# Patient Record
Sex: Male | Born: 1955 | Race: White | Hispanic: No | Marital: Married | Smoking: Current some day smoker
Health system: Southern US, Community
[De-identification: ages and names within clinical notes are randomized; demographics above are authoritative.]

## PROBLEM LIST (undated history)

## (undated) DIAGNOSIS — K2270 Barrett's esophagus without dysplasia: Secondary | ICD-10-CM

## (undated) DIAGNOSIS — N5089 Other specified disorders of the male genital organs: Secondary | ICD-10-CM

## (undated) DIAGNOSIS — C801 Malignant (primary) neoplasm, unspecified: Secondary | ICD-10-CM

## (undated) DIAGNOSIS — T7840XA Allergy, unspecified, initial encounter: Secondary | ICD-10-CM

## (undated) DIAGNOSIS — K5792 Diverticulitis of intestine, part unspecified, without perforation or abscess without bleeding: Secondary | ICD-10-CM

## (undated) DIAGNOSIS — Z85828 Personal history of other malignant neoplasm of skin: Secondary | ICD-10-CM

## (undated) DIAGNOSIS — Z87442 Personal history of urinary calculi: Secondary | ICD-10-CM

## (undated) DIAGNOSIS — T4145XA Adverse effect of unspecified anesthetic, initial encounter: Secondary | ICD-10-CM

## (undated) DIAGNOSIS — G4733 Obstructive sleep apnea (adult) (pediatric): Secondary | ICD-10-CM

## (undated) DIAGNOSIS — C6290 Malignant neoplasm of unspecified testis, unspecified whether descended or undescended: Secondary | ICD-10-CM

## (undated) DIAGNOSIS — J302 Other seasonal allergic rhinitis: Secondary | ICD-10-CM

## (undated) DIAGNOSIS — N529 Male erectile dysfunction, unspecified: Secondary | ICD-10-CM

## (undated) DIAGNOSIS — E7800 Pure hypercholesterolemia, unspecified: Secondary | ICD-10-CM

## (undated) DIAGNOSIS — I351 Nonrheumatic aortic (valve) insufficiency: Secondary | ICD-10-CM

## (undated) DIAGNOSIS — D649 Anemia, unspecified: Secondary | ICD-10-CM

## (undated) DIAGNOSIS — I38 Endocarditis, valve unspecified: Secondary | ICD-10-CM

## (undated) DIAGNOSIS — Z8679 Personal history of other diseases of the circulatory system: Secondary | ICD-10-CM

## (undated) DIAGNOSIS — R918 Other nonspecific abnormal finding of lung field: Secondary | ICD-10-CM

## (undated) DIAGNOSIS — I10 Essential (primary) hypertension: Secondary | ICD-10-CM

## (undated) DIAGNOSIS — E785 Hyperlipidemia, unspecified: Secondary | ICD-10-CM

## (undated) DIAGNOSIS — Z860100 Personal history of colon polyps, unspecified: Secondary | ICD-10-CM

## (undated) DIAGNOSIS — Z9889 Other specified postprocedural states: Secondary | ICD-10-CM

## (undated) DIAGNOSIS — T8859XA Other complications of anesthesia, initial encounter: Secondary | ICD-10-CM

## (undated) HISTORY — DX: Personal history of colon polyps, unspecified: Z86.0100

## (undated) HISTORY — DX: Personal history of colonic polyps: Z86.010

## (undated) HISTORY — PX: HERNIA REPAIR: SHX51

## (undated) HISTORY — PX: EXTRACORPOREAL SHOCK WAVE LITHOTRIPSY: SHX1557

## (undated) HISTORY — DX: Diverticulitis of intestine, part unspecified, without perforation or abscess without bleeding: K57.92

## (undated) HISTORY — PX: COLONOSCOPY WITH ESOPHAGOGASTRODUODENOSCOPY (EGD): SHX5779

## (undated) HISTORY — DX: Anemia, unspecified: D64.9

## (undated) HISTORY — DX: Pure hypercholesterolemia, unspecified: E78.00

## (undated) HISTORY — DX: Hyperlipidemia, unspecified: E78.5

## (undated) HISTORY — PX: MOHS SURGERY: SHX181

## (undated) HISTORY — PX: COLONOSCOPY: SHX174

## (undated) HISTORY — DX: Allergy, unspecified, initial encounter: T78.40XA

## (undated) HISTORY — DX: Other seasonal allergic rhinitis: J30.2

## (undated) HISTORY — PX: TRANSTHORACIC ECHOCARDIOGRAM: SHX275

## (undated) HISTORY — DX: Personal history of other diseases of the circulatory system: Z86.79

## (undated) HISTORY — DX: Barrett's esophagus without dysplasia: K22.70

---

## 1998-02-17 DIAGNOSIS — C4491 Basal cell carcinoma of skin, unspecified: Secondary | ICD-10-CM

## 1998-02-17 HISTORY — DX: Basal cell carcinoma of skin, unspecified: C44.91

## 1998-10-06 ENCOUNTER — Encounter: Payer: Self-pay | Admitting: Emergency Medicine

## 1998-10-06 ENCOUNTER — Observation Stay (HOSPITAL_COMMUNITY): Admission: EM | Admit: 1998-10-06 | Discharge: 1998-10-06 | Payer: Self-pay | Admitting: Emergency Medicine

## 1998-10-13 ENCOUNTER — Encounter: Payer: Self-pay | Admitting: Urology

## 1998-10-13 ENCOUNTER — Ambulatory Visit (HOSPITAL_COMMUNITY): Admission: RE | Admit: 1998-10-13 | Discharge: 1998-10-13 | Payer: Self-pay | Admitting: Urology

## 1998-12-21 ENCOUNTER — Encounter: Payer: Self-pay | Admitting: Emergency Medicine

## 1998-12-21 ENCOUNTER — Emergency Department (HOSPITAL_COMMUNITY): Admission: EM | Admit: 1998-12-21 | Discharge: 1998-12-21 | Payer: Self-pay | Admitting: Emergency Medicine

## 2002-01-13 DIAGNOSIS — C4491 Basal cell carcinoma of skin, unspecified: Secondary | ICD-10-CM

## 2002-01-13 HISTORY — DX: Basal cell carcinoma of skin, unspecified: C44.91

## 2002-12-24 ENCOUNTER — Encounter: Payer: Self-pay | Admitting: Pulmonary Disease

## 2002-12-24 ENCOUNTER — Ambulatory Visit (HOSPITAL_COMMUNITY): Admission: RE | Admit: 2002-12-24 | Discharge: 2002-12-24 | Payer: Self-pay | Admitting: Pulmonary Disease

## 2004-12-12 ENCOUNTER — Ambulatory Visit: Payer: Self-pay | Admitting: Pulmonary Disease

## 2005-01-08 ENCOUNTER — Ambulatory Visit (HOSPITAL_BASED_OUTPATIENT_CLINIC_OR_DEPARTMENT_OTHER): Admission: RE | Admit: 2005-01-08 | Discharge: 2005-01-08 | Payer: Self-pay | Admitting: Pulmonary Disease

## 2005-01-18 ENCOUNTER — Ambulatory Visit: Payer: Self-pay | Admitting: Pulmonary Disease

## 2005-04-30 HISTORY — PX: OTHER SURGICAL HISTORY: SHX169

## 2005-11-02 ENCOUNTER — Ambulatory Visit: Payer: Self-pay | Admitting: Pulmonary Disease

## 2006-01-15 ENCOUNTER — Ambulatory Visit: Payer: Self-pay | Admitting: Internal Medicine

## 2006-01-24 ENCOUNTER — Ambulatory Visit: Payer: Self-pay | Admitting: Internal Medicine

## 2006-01-24 ENCOUNTER — Encounter (INDEPENDENT_AMBULATORY_CARE_PROVIDER_SITE_OTHER): Payer: Self-pay | Admitting: Specialist

## 2007-10-30 ENCOUNTER — Encounter: Payer: Self-pay | Admitting: Pulmonary Disease

## 2007-11-06 ENCOUNTER — Ambulatory Visit (HOSPITAL_COMMUNITY): Admission: RE | Admit: 2007-11-06 | Discharge: 2007-11-06 | Payer: Self-pay | Admitting: Urology

## 2007-12-31 ENCOUNTER — Encounter: Payer: Self-pay | Admitting: Pulmonary Disease

## 2008-04-27 ENCOUNTER — Encounter: Payer: Self-pay | Admitting: Pulmonary Disease

## 2008-12-23 ENCOUNTER — Encounter: Payer: Self-pay | Admitting: Pulmonary Disease

## 2009-02-11 ENCOUNTER — Telehealth (INDEPENDENT_AMBULATORY_CARE_PROVIDER_SITE_OTHER): Payer: Self-pay | Admitting: *Deleted

## 2009-02-17 ENCOUNTER — Ambulatory Visit: Payer: Self-pay | Admitting: Pulmonary Disease

## 2009-02-17 DIAGNOSIS — I359 Nonrheumatic aortic valve disorder, unspecified: Secondary | ICD-10-CM | POA: Insufficient documentation

## 2009-02-17 DIAGNOSIS — R0989 Other specified symptoms and signs involving the circulatory and respiratory systems: Secondary | ICD-10-CM | POA: Insufficient documentation

## 2009-02-17 DIAGNOSIS — N200 Calculus of kidney: Secondary | ICD-10-CM | POA: Insufficient documentation

## 2009-02-17 DIAGNOSIS — R0609 Other forms of dyspnea: Secondary | ICD-10-CM | POA: Insufficient documentation

## 2009-02-17 DIAGNOSIS — K429 Umbilical hernia without obstruction or gangrene: Secondary | ICD-10-CM | POA: Insufficient documentation

## 2009-02-17 DIAGNOSIS — M545 Low back pain, unspecified: Secondary | ICD-10-CM | POA: Insufficient documentation

## 2009-02-17 DIAGNOSIS — E7800 Pure hypercholesterolemia, unspecified: Secondary | ICD-10-CM | POA: Insufficient documentation

## 2009-03-01 ENCOUNTER — Encounter: Payer: Self-pay | Admitting: Pulmonary Disease

## 2009-03-01 ENCOUNTER — Ambulatory Visit: Payer: Self-pay | Admitting: Cardiology

## 2009-03-01 ENCOUNTER — Ambulatory Visit (HOSPITAL_COMMUNITY): Admission: RE | Admit: 2009-03-01 | Discharge: 2009-03-01 | Payer: Self-pay | Admitting: Pulmonary Disease

## 2009-03-01 ENCOUNTER — Ambulatory Visit: Payer: Self-pay

## 2009-03-10 ENCOUNTER — Encounter: Payer: Self-pay | Admitting: Pulmonary Disease

## 2009-03-18 ENCOUNTER — Telehealth: Payer: Self-pay | Admitting: Pulmonary Disease

## 2009-06-08 ENCOUNTER — Encounter: Payer: Self-pay | Admitting: Pulmonary Disease

## 2009-06-08 HISTORY — PX: UMBILICAL HERNIA REPAIR: SHX196

## 2009-06-28 ENCOUNTER — Encounter: Payer: Self-pay | Admitting: Pulmonary Disease

## 2009-08-12 ENCOUNTER — Ambulatory Visit: Payer: Self-pay | Admitting: Pulmonary Disease

## 2009-12-08 ENCOUNTER — Encounter: Payer: Self-pay | Admitting: Pulmonary Disease

## 2009-12-23 ENCOUNTER — Encounter: Payer: Self-pay | Admitting: Pulmonary Disease

## 2010-03-13 ENCOUNTER — Ambulatory Visit: Payer: Self-pay | Admitting: Pulmonary Disease

## 2010-03-13 ENCOUNTER — Encounter: Payer: Self-pay | Admitting: Pulmonary Disease

## 2010-05-30 NOTE — Progress Notes (Signed)
Summary: FORMER PT  Phone Note Call from Patient Call back at [phone]   Caller: Patient Call For: [name] Summary of Call: PT LAST SEEN IN 2006 WOULD LIKE TO BE RE-ESTABLISH WITH DR [name] Initial call taken by: Rickard Patience,  February 11, 2009 9:07 AM  Follow-up for Phone Call        Please advise if this is okay thanks! [name] Anderson  February 11, 2009 12:13 PM per sn ok to schedule pt for cpx Follow-up by: Philipp Deputy CMA,  February 15, 2009 4:33 PM

## 2010-05-30 NOTE — Consult Note (Signed)
Summary: Symptomatic Umbilical Hernia/[facility]  Symptomatic Umbilical Hernia/[facility]   Imported By: Sherian Rein 03/28/2009 08:53:08  _____________________________________________________________________  External Attachment:    Type:   Image     Comment:   External Document

## 2010-05-30 NOTE — Letter (Signed)
Summary: [facility]   Imported By: Lester Dunkirk 07/11/2009 09:17:01  _____________________________________________________________________  External Attachment:    Type:   Image     Comment:   External Document

## 2010-05-30 NOTE — Consult Note (Signed)
Summary: renal stones/[facility]  renal stones/[facility]   Imported By: Lester Coward 01/12/2008 09:33:57  _____________________________________________________________________  External Attachment:    Type:   Image     Comment:   External Document

## 2010-05-30 NOTE — Assessment & Plan Note (Signed)
Summary: 6 months/apc   CC:  7 month ROV & review of mult medical problems....  History of Present Illness: 55 y/o WM here for a follow up visit...   ~  February 17, 2009:  [name] Anderson was last seen 8/06... on no regular meds... he works for the [employer] and gets annual eval w/ labs there & he will have these labs forwarded to me for review... his CC is mid- abd discomfort/ pain x 3-4 months> periumbilical, intermittent, getting worse- rated 5-6/10, occuring 2-3 d per week, precipitated by lifting, & hasn't tried anything for relief- but notes pain decr if supine... assoc w/ "knot" around the belly button that is tender... denies n/v, denies change in BM/d/c/blood... exam shows a tender umbilical hernia & we discussed refer to [facility] for Rx.   ~  August 12, 2009:  he had umbil hernia repair 2/11 by DrIngram> he reports doing well & has recovered nicely & released by the surgeon... otherw feeling well & back to baseline> we reviewed his prev data & encouraged weight reduction, diet, exercise.   ~  March 13, 2010:  his only meds= ASA & Fish Oil but his Chol remains signif elev & he needs Statin Rx but declines to start this needed medication "I'll incr Fish Oil" & I explained that this won't get him to goal- offered second opinion w/ Cards vs Endocrine or Lipid Clinic but he declines my offers & prefers diet Rx alone + his Fish POil supplements... he also has signif snoring problem w/ prev ENT surg in past but he continues to snore & pt or wife freq sleep in another room because of the noise- he denies daytime hypersomnolence, sleep pressure, etc... I reviewed prev sleep study & recs from DrClance but he declines sleep med f/u, dental appliance, or CPAP trial...  hx Ao valve dis w/ mod AI & he knows to use Amox prior to dental work> denies CP, palpit, SOB, edema, etc...   Current Problems:   SNORING (ICD-786.09) - long hx of snoring problems> hx some mild daytime symptoms...  ~  he had a nasal septal  repair & uvulectomy yrs ago by [employer] [facility]...  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  ~  eval 2007 by DrClance & offer oral appliance vs CPAP trial but pt couldn't make up his mind...  ~  11/11: he notes that he or wife freq have to sleep in another room due to his snoring>  offered sleep f/u w/ DrClance, consideration of oral appliance, vs CPAP trial- but he declines all interventions...  AORTIC VALVE DISORDERS (ICD-424.1) - no prev known hx of valvular heart disease... he denies CP, palpit, SOB/ DOE, edema, etc... 2DEcho 11/10 w/ mild-mod AI & rec for SBE prophylaxis w/ AMOX.  ~  CXR 10/10 showed normal- norm hrt size, & Ao, clear lungs...  ~  EKG 1/10 shows NSR, WNL.Marland Kitchen.  ~  exam 10/10 showed gr 2/6 AI murmur & 2DEcho showed norm LV, mild DD, mild-mod AI & mild MR...  ~  exam 11/11 w/ similar murmur & no change (he is asymptomatic)...  HYPERCHOLESTEROLEMIA (ICD-272.0) - he remains on diet + exercise therapy+ FISH OIL... his weight has been stable in the 205-210# range and can't seem to lose weight... 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  ~  last FLP here 2002 showed TChol 233, TG 120, HDL 38, LDL 171... offered meds, prefers diet.  ~  labs 8/10 at [facility] showed TChol 219, TG 89, HDL 38, LDL 163... he does not want med Rx.  ~  labs 8/11 from [facility] showed TChol 254, TG 120, HDL 44, LDL 186... he declined all offers for med rx or Lipid Clinic review etc.  COLONIC POLYPS (ICD-211.3)  ~  last colonoscopy 9/07 by Rodena Medin showed 2 sm polyps, largest 5mm, bx= tubular adenoma- f/u planned 46yrs.  UMBILICAL HERNIA (ICD-553.1) - s/p umbil hernia repair 2/11 by DrHIngram...  NEPHROLITHIASIS (ICD-592.0) - followed by DrDahlstadt & last seen earlier in 2010- passed stone on his own w/ Flomax help...   ~  11/11: he states that he passed another stone on his own about 3 months ago...  BACK PAIN, LUMBAR (ICD-724.2) - referred to [facility] 2004 for eval LBP... Rx w/ Celebrex, Robaxin, & PT w/ improvement in his discomfort...  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   Preventive Screening-Counseling & Management  Alcohol-Tobacco     Smoking Status: never  Allergies (verified): No Known Drug Allergies  Comments:  Nurse/Medical Assistant: The patient's medications and allergies were reviewed with the patient and were updated in the Medication and Allergy Lists.  Past History:  Past Medical History: SNORING (ICD-786.09) AORTIC VALVE DISORDERS (ICD-424.1) HYPERCHOLESTEROLEMIA (ICD-272.0) COLONIC POLYPS (ICD-211.3) UMBILICAL HERNIA (ICD-553.1) NEPHROLITHIASIS (ICD-592.0) BACK PAIN, LUMBAR (ICD-724.2) SKIN CANCER, HX OF (ICD-V10.83)  Past Surgical History: S/P umbilical hernia repair 2/11 by DrHIngram  Family History: Reviewed history from 02/17/2009 and no changes required. mother alive age 46 father deceased age 65 from colon cancer 1 sibling alive age 33   1 sibling alive age 102  Social History: Reviewed history from 02/17/2009 and no changes required. never smoked exposed to second hand smoke exercises sometimes 2 x per week caffeine use  2-3 cups per day uses smokeless tobacco---dip no alcohol use married works for the [employer] 2 children Smoking Status:  never  Review of Systems      See HPI  The patient denies anorexia, fever, weight loss, weight gain, vision loss, decreased hearing, hoarseness, chest pain, syncope, dyspnea on exertion, peripheral edema, prolonged cough, headaches, hemoptysis, abdominal pain, melena, hematochezia, severe indigestion/heartburn, hematuria, incontinence, muscle weakness, suspicious skin lesions, transient blindness, difficulty  walking, depression, unusual weight change, abnormal bleeding, enlarged lymph nodes, and angioedema.    Vital Signs:  Patient profile:   55 year old male Height:      71 inches Weight:      209 pounds BMI:     29.25 O2 Sat:      99 % on Room air Temp:     97.9 degrees F oral Pulse rate:   76 / minute BP sitting:   120 / 78  (left arm) Cuff size:   regular  Vitals Entered By: Randell Loop CMA (March 13, 2010 10:00 AM)  O2 Sat at Rest %:  99 O2 Flow:  Room air CC: 7 month ROV & review of mult medical problems... Is Patient Diabetic? No Pain Assessment Patient in pain? no      Comments MEDS UPDATED TODAY WITH PT   Physical Exam  Additional Exam:  WD, WN, 55 y/o WM in NAD... GENERAL:  Alert & oriented; pleasant & cooperative. HEENT:  [place]/AT, EOM-wnl, PERRLA, EACs-clear, TMs-wnl, NOSE-clear, THROAT-clear & wnl. NECK:  Supple w/ full ROM; no JVD; normal carotid impulses w/o bruits; no thyromegaly or nodules palpated; no lymphadenopathy. CHEST:  Clear to P & A; without wheezes/ rales/ or rhonchi.... HEART:  Regular Rhythm;  gr 2/6 AI murmur, w/o rubs or gallops detected... ABDOMEN:  s/p umbil hernia repair- well healed scar; normal bowel sounds; no organomegaly or masses palpated... RECTAL:  prostate 2+ normal, stool heme neg... EXT: without deformities or arthritic changes; no varicose veins/ venous insuffic/ or edema. NEURO:  CN's intact; motor testing normal; sensory testing normal; gait normal & balance OK. DERM:  No lesions noted; no rash etc...    CXR  Procedure date:  03/13/2010  Findings:      CHEST - 2 VIEW Comparison: Chest radiograph 02/18/1999   Findings: Normal mediastinum and heart silhouette.  Costophrenic angles are clear.  No evidence effusion, infiltrate, or pneumothorax   IMPRESSION: No acute cardiopulmonary process.   Read By:  Genevive Bi,  M.D.   EKG  Procedure date:  03/13/2010  Findings:      Normal sinus rhythm with rate of:   64/ min... Tracing is WNL, NAD...  SN   MISC. Report  Procedure date:  12/08/2009  Findings:      LABS FROM [facility] REVIEWED W/ Pt & will be scanned into the record...   SN   Impression & Recommendations:  Problem # 1:  SNORING (ICD-786.09) I have offered repeat sleep eval, consideration of oral appliance, & CPAP trial>  he continues to decline intervention to help his snoring...  Orders: T-2 View CXR (71020TC)  Problem # 2:  AORTIC VALVE DISORDERS (ICD-424.1) Stable mild to mod AI on exam... he is asymptomatic & knows to use SBE prophy w/ dental work... His updated medication list for this problem includes:    Aspir-low 81 Mg Tbec (Aspirin) .Marland Kitchen... Take 1 tablet by mouth once a day  Orders: 12 Lead EKG (12 Lead EKG) T-2 View CXR (71020TC)  Problem # 3:  HYPERCHOLESTEROLEMIA (ICD-272.0) Signif hyperchol> bur he declines Statin meds or referral for second opinion... he understands how plaque build up in vessels & leads to blockages that could cause MI or stroke (he still declines med rx)...  Problem # 4:  COLONIC POLYPS (ICD-211.3) Up to date on colon screening w/ f/u due 9/12...  Problem # 5:  NEPHROLITHIASIS (ICD-592.0) He says that he passed a sm stone on his own in Aug11...  Problem # 6:  OTHER MEDICAL ISSUES AS NOTED>>>  Complete Medication List: 1)  Aspir-low 81 Mg Tbec (Aspirin) .... Take 1 tablet by mouth once a day 2)  Fish Oil 1000 Mg Caps (Omega-3 fatty acids) .... Take 1 tablet by mouth once a day  Patient Instructions: 1)  Today we reviewed your fasting blood work done at [facility] 8/11> your Lipid panel remains abnormal &  your LDL is worse (now at 189)... I recommend that you start on a statin med for the hypercholesterolemia... the best statins for the purpose are Crestor, Lipitor, Simvistatin & Pravastatin> let me know if you want to consider med Rx for this problem... 2)  In the meanwhile you need a vigorous low chol, low fat, now carb diet to get your  weight down... 3)  Today we did your follow up CXR & EKG... please call the "phone tree" in a few days for your results.Marland KitchenMarland Kitchen 4)  Call for any questions...   Immunization History:  Influenza Immunization History:  Influenza:  historical (02/13/2010)

## 2010-05-30 NOTE — Assessment & Plan Note (Signed)
Summary: stomach pain//td   CC:  4 year ROV & add-on for abd pain....  History of Present Illness: 55 y/o WM here for a follow up visit...   ~  February 17, 2009:  MrSmith was last seen 8/06... on no regular meds... he works for the [employer] and gets annual eval w/ labs there & he will have these labs forwarded to me for review... his CC is mid- abd discomfort/ pain x 3-4 months> periumbilical, intermittent, getting worse- rated 5-6/10, occuring 2-3 d per week, precipitated by lifting, & hasn't tried anything for relief- but notes pain decr if supine... assoc w/ "knot" around the belly button that is tender... denies n/v, denies change in BM/d/c/blood... exam shows a tender umbilical hernia & we discussed refer to [facility] for Rx.   Current Problems:   SNORING (ICD-786.09) - long hx of snoring problems> hx ome mild daytime symptoms...  ~  he had a nasal septal repair & uvulectomy yrs ago by [employer] [facility]...  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  ~  eval 2007 by DrClance & offer oral appliance vs CPAP trial but pt couldn't make up his mind...  AORTIC VALVE DISORDERS (ICD-424.1) - no prev known hx of valvular heart disease... he denies CP, palpit, SOB/ DOE, edema, etc...  ~  CXR 10/10 showed normal- norm hrt size, & Ao, clear lungs...  ~  EKG 1/10 shows NSR, WNL.Marland Kitchen.  ~  exam 10/10 showed gr 2/6 AI murmur & referred for 2DEcho...  HYPERCHOLESTEROLEMIA (ICD-272.0) - he remains on diet + exercise therapy... his weight has been stable in the 205-210# range and can't seem to lose weight... he gets labs at fire dept...  ~  last FLP here 2002 showed TChol 233, TG 120, HDL 38, LDL 171... offered meds, prefers diet.  COLONIC POLYPS (ICD-211.3)  ~  last colonoscopy 9/07 by Rodena Medin showed 2 sm polyps,  largest 5mm, bx= tubular adenoma- f/u planned 60yrs.  UMBILICAL HERNIA (ICD-553.1) ** SEE ABOVE ** refer to [facility]...  NEPHROLITHIASIS (ICD-592.0) - followed by DrDahlstadt & last seen earlier in 2010- passed stone on his own w/ Flomax help...   BACK PAIN, LUMBAR (ICD-724.2) - referred to [facility] for eval LBP... Rx w/ Celebrex, Robaxin, & PT w/ improvement in his discomfort...  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    Allergies (verified): No Known Drug Allergies  Past History:  Past Medical History:  SNORING (ICD-786.09) AORTIC VALVE DISORDERS (ICD-424.1) HYPERCHOLESTEROLEMIA (ICD-272.0) COLONIC POLYPS (ICD-211.3) UMBILICAL HERNIA (ICD-553.1) NEPHROLITHIASIS (ICD-592.0) BACK PAIN, LUMBAR (ICD-724.2) SKIN CANCER, HX OF (ICD-V10.83)  Family History: Reviewed history and no changes required. mother alive age 35 father deceased age 63 from colon cancer 1 sibling alive age 4   1 sibling alive age 37  Social History: Reviewed history and no changes required. never smoked exposed to second hand smoke exercises sometimes 2 x per week caffeine use  2-3 cups per day uses smokeless tobacco---dip no alcohol use married works for the [employer] 2 children  Review of Systems       The patient complains of abdominal pain.  The patient denies fever, chills, sweats, anorexia, fatigue, weakness, malaise, weight  loss, sleep disorder, blurring, diplopia, eye irritation, eye discharge, vision loss, eye pain, photophobia, earache, ear discharge, tinnitus, decreased hearing, nasal congestion, nosebleeds, sore throat, hoarseness, chest pain, palpitations, syncope, dyspnea on exertion, orthopnea, PND, peripheral edema, cough, dyspnea at rest, excessive sputum, hemoptysis, wheezing, pleurisy, nausea, vomiting, diarrhea, constipation, change in bowel habits, melena, hematochezia, jaundice,  gas/bloating, indigestion/heartburn, dysphagia, odynophagia, dysuria, hematuria, urinary frequency, urinary hesitancy, nocturia, incontinence, back pain, joint pain, joint swelling, muscle cramps, muscle weakness, stiffness, arthritis, sciatica, restless legs, leg pain at night, leg pain with exertion, rash, itching, dryness, suspicious lesions, paralysis, paresthesias, seizures, tremors, vertigo, transient blindness, frequent falls, frequent headaches, difficulty walking, depression, anxiety, memory loss, confusion, cold intolerance, heat intolerance, polydipsia, polyphagia, polyuria, unusual weight change, abnormal bruising, bleeding, enlarged lymph nodes, urticaria, allergic rash, hay fever, and recurrent infections.    Vital Signs:  Patient profile:   55 year old male Height:      71 inches Weight:      208.50 pounds BMI:     29.18 O2 Sat:      95 % on Room air Temp:     97.8 degrees F oral Pulse rate:   82 / minute BP sitting:   126 / 70  (left arm) Cuff size:   regular  Vitals Entered By: Marijo File CMA (February 17, 2009 2:32 PM)  O2 Sat at Rest %:  95 O2 Flow:  Room air CC: 4 year ROV & add-on for abd pain... Is Patient Diabetic? No Pain Assessment Patient in pain? no      Comments meds updated today   Physical Exam  Additional Exam:  WD, WN, 55 y/o WM in NAD... GENERAL:  Alert & oriented; pleasant & cooperative. HEENT:  [place]/AT, EOM-wnl, PERRLA, EACs-clear, TMs-wnl, NOSE-clear, THROAT-clear & wnl. NECK:  Supple w/ full ROM; no JVD; normal carotid impulses w/o bruits; no thyromegaly or nodules palpated; no lymphadenopathy. CHEST:  Clear to P & A; without wheezes/ rales/ or rhonchi.... HEART:  Regular Rhythm;  gr 2/6 AI murmur, w/o rubs or gallops detected... ABDOMEN:  Small umbilical hernia, tender on palp, normal bowel sounds; no organomegaly or masses palpated... RECTAL:  Neg - prostate 2+ & nontender w/o nodules; stool hematest neg... no inguinal hernia noted. EXT:  without deformities or arthritic changes; no varicose veins/ venous insuffic/ or edema. NEURO:  CN's intact; motor testing normal; sensory testing normal; gait normal & balance OK. DERM:  No lesions noted; no rash etc...     EKG  Procedure date:  02/17/2009  Findings:      Normal sinus rhythm with rate of:  70/min... EKG is WNL.Marland KitchenMarland Kitchen    CXR  Procedure date:  02/17/2009  Findings:      CHEST - 2 VIEW   Comparison: None.   Findings: Heart size is normal and the aorta is normal in size and contour.  The lungs are clear without infiltrate or mass.   IMPRESSION: No active cardiopulmonary disease.   Read By:  Camelia Phenes,  M.D.    Impression & Recommendations:  Problem # 1:  UMBILICAL HERNIA (ICD-553.1) His abd pain appears to be related to an umbil hernia and will need repair>>> refer to [facility]. Orders: Surgical Referral (Surgery)  Problem # 2:  SNORING (ICD-786.09) Stable-  he states NO daytime symptoms now & he's not interested in pursuung this further...  Problem # 3:  AORTIC VALVE DISORDERS (ICD-424.1) New finding on todays exam... check of prev evals from 2006 & before was neg- no  AI noted then... He denies CP, palpit, SOB, edema, etc... we will check 2DEcho & has cards eval after that... His updated medication list for this problem includes:    Aspir-low 81 Mg Tbec (Aspirin) .Marland Kitchen... Take 1 tablet by mouth once a day  Orders: 12 Lead EKG (12 Lead EKG) T-2 View CXR (71020TC) 2 D Echo (2 D Echo)  Problem # 4:  HYPERCHOLESTEROLEMIA (ICD-272.0) He will get the labs from the last few yrs referred to me to review... discussed diet + exercise therapy in the interim...  Problem # 5:  COLONIC POLYPS (ICD-211.3) Stable & up to date on his colonoscopies...  Problem # 6:  NEPHROLITHIASIS (ICD-592.0) Stable & followed by Urology...  Problem # 7:  OTHER MEDICAL PROBLEMS AS NOTED>>>  Complete Medication List: 1)  Aspir-low 81 Mg Tbec (Aspirin) .... Take 1 tablet by  mouth once a day 2)  Fish Oil 1000 Mg Caps (Omega-3 fatty acids) .... Take 1 tablet by mouth once a day 3)  Vicodin 5-500 Mg Tabs (Hydrocodone-acetaminophen) .... Take 1 tab by mouth every 6-8h as needed for pain...  Other Orders: Prescription Created Electronically [phone])  Patient Instructions: 1)  Today we updated your med list- see below.... 2)  We wrote a new perscription for a pain pill for you to use up to 3 times daily as needed...  3)  We will set up a referral to the general surgeons for further eval & treatment (hernia repair).Marland KitchenMarland Kitchen 4)  We also identified a leaky heart valve (aortic insufficiency)... we will set up an echocardiogram for further evaluation and call you w/ the results when avail.Marland KitchenMarland Kitchen 5)  Please send copies of your blood work over the last 2 yrs or so from work... 6)  Let's plan a follow up eval in about 6 month, sooner as needed. Prescriptions: VICODIN 5-500 MG TABS (HYDROCODONE-ACETAMINOPHEN) take 1 tab by mouth every 6-8H as needed for pain...  #50 x 2   Entered and Authorized by:   Michele Mcalpine MD   Signed by:   Michele Mcalpine MD on 02/17/2009   Method used:   Print then Mail to Patient   RxID:   [id number]    [facility] ECG  ID: [id number] Patient: [name] DOB: 1956/01/04 Age: 55 Years Old Sex: Male Race: White Physician: Judianne Seiple Technician: Marijo File CMA Height: 71 Weight: 208.50 Status: Confirmed Recorded: 02/17/2009 3:49 PM P/PR: 117 ms / 168 ms - Heart rate (maximum exercise) QRS: 95 QT/QTc/QTd: 417 ms / 434 ms / 29 ms - Heart rate (maximum exercise)  P/QRS/T axis: 45 deg / 16 deg / 41 deg - Heart rate (maximum exercise)  Heartrate: 70 bpm  Interpretation:  Normal sinus rhythm with rate of:  70/min... EKG is WNL.Marland KitchenMarland Kitchen

## 2010-05-30 NOTE — Consult Note (Signed)
Summary: [facility]  [facility]   Imported By: Stephannie Li 11/11/2007 11:26:57  _____________________________________________________________________  External Attachment:    Type:   Image     Comment:   External Document

## 2010-05-30 NOTE — Progress Notes (Signed)
Summary: papers  Phone Note Call from Patient   Caller: Patient Call For: Aysa Larivee Summary of Call: papers for dr Marrah Vanevery put on leigh's desk Initial call taken by: Rickard Patience,  March 18, 2009 8:28 AM  Follow-up for Phone Call        papers have been done. Marijo File CMA  March 28, 2009 4:37 PM

## 2010-05-30 NOTE — Op Note (Signed)
Summary: Ernestene Mention MD  Ernestene Mention MD   Imported By: Lester Hanover 07/08/2009 10:29:55  _____________________________________________________________________  External Attachment:    Type:   Image     Comment:   External Document

## 2010-05-30 NOTE — Assessment & Plan Note (Signed)
Summary: 6 months/apc   CC:  6 month ROV & review....  History of Present Illness: 55 y/o WM here for a follow up visit...   ~  February 17, 2009:  MrSmith was last seen 8/06... on no regular meds... he works for the [employer] and gets annual eval w/ labs there & he will have these labs forwarded to me for review... his CC is mid- abd discomfort/ pain x 3-4 months> periumbilical, intermittent, getting worse- rated 5-6/10, occuring 2-3 d per week, precipitated by lifting, & hasn't tried anything for relief- but notes pain decr if supine... assoc w/ "knot" around the belly button that is tender... denies n/v, denies change in BM/d/c/blood... exam shows a tender umbilical hernia & we discussed refer to [facility] for Rx.   ~  August 12, 2009:  he had umbil hernia repair 2/11 by DrIngram> he reports doing well & has recovered nicely & released by the surgeon... otherw feeling well & back to baseline> we reviewed his prev data & encouraged weight reduction, diet, exercise.   Current Problems:   SNORING (ICD-786.09) - long hx of snoring problems> hx some mild daytime symptoms...  ~  he had a nasal septal repair & uvulectomy yrs ago by [employer]...  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  ~  eval 2007 by DrClance & offer oral appliance vs CPAP trial but pt couldn't make up his mind...  AORTIC VALVE DISORDERS (ICD-424.1) - no prev known hx of valvular heart disease... he denies CP, palpit, SOB/ DOE, edema, etc... 2DEcho 11/10 w/ mild-mod AI & rec for SBE prophylaxis w/ AMOX.  ~  CXR 10/10 showed normal- norm hrt size, & Ao, clear lungs...  ~  EKG 1/10 shows NSR, WNL.Marland Kitchen.  ~  exam 10/10 showed gr 2/6 AI murmur & 2DEcho showed norm LV, mild DD, mild-mod AI & mild MR...  HYPERCHOLESTEROLEMIA (ICD-272.0) - he  remains on diet + exercise therapy+ FISH OIL... his weight has been stable in the 205-210# range and can't seem to lose weight... he gets labs at fire dept...  ~  last FLP here 2002 showed TChol 233, TG 120, HDL 38, LDL 171... offered meds, prefers diet.  ~  labs 8/10 at [facility] showed TChol 219, TG 89, HDL 38, LDL 163... he does not want med Rx.  COLONIC POLYPS (ICD-211.3)  ~  last colonoscopy 9/07 by Rodena Medin showed 2 sm polyps, largest 5mm, bx= tubular adenoma- f/u planned 39yrs.  UMBILICAL HERNIA (ICD-553.1) - s/p umbil hernia repair 2/11 by DrHIngram...  NEPHROLITHIASIS (ICD-592.0) - followed by DrDahlstadt & last seen earlier in 2010- passed stone on his own w/ Flomax help...   BACK PAIN, LUMBAR (ICD-724.2) - referred to [facility] 2004 for eval LBP... Rx w/ Celebrex, Robaxin, & PT w/ improvement in his discomfort...  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   Allergies (verified): No Known Drug Allergies  Comments:  Nurse/Medical Assistant: The patient's medications and allergies were reviewed with the patient and were updated in the Medication and Allergy Lists.  Past History:  Past Medical History:  SNORING (ICD-786.09) AORTIC VALVE DISORDERS (ICD-424.1) HYPERCHOLESTEROLEMIA (ICD-272.0) COLONIC POLYPS (ICD-211.3) UMBILICAL HERNIA (ICD-553.1) NEPHROLITHIASIS (ICD-592.0)  BACK PAIN, LUMBAR (ICD-724.2) SKIN CANCER, HX OF (ICD-V10.83)  Past Surgical History: S/P umbilical hernia repair 2/11 by DrHIngram  Family History: Reviewed history from 02/17/2009 and no changes required. mother alive age 17 father deceased age 93 from colon cancer 1 sibling alive age 28   1 sibling alive age 61  Social History: Reviewed history from 02/17/2009 and no changes required. never smoked exposed to second hand smoke exercises sometimes 2 x per week caffeine use  2-3 cups per  day uses smokeless tobacco---dip no alcohol use married works for the [employer] 2 children  Review of Systems  The patient denies anorexia, fever, weight loss, weight gain, vision loss, decreased hearing, hoarseness, chest pain, syncope, dyspnea on exertion, peripheral edema, prolonged cough, headaches, hemoptysis, abdominal pain, melena, hematochezia, severe indigestion/heartburn, hematuria, incontinence, muscle weakness, suspicious skin lesions, transient blindness, difficulty walking, depression, unusual weight change, abnormal bleeding, enlarged lymph nodes, and angioedema.    Vital Signs:  Patient profile:   55 year old male Height:      71 inches Weight:      206.38 pounds BMI:     28.89 O2 Sat:      98 % on Room air Temp:     98.7 degrees F oral Pulse rate:   62 / minute BP sitting:   110 / 60  (right arm) Cuff size:   regular  Vitals Entered By: Randell Loop CMA (August 12, 2009 10:53 AM)  O2 Sat at Rest %:  98 O2 Flow:  Room air CC: 6 month ROV & review... Is Patient Diabetic? No Pain Assessment Patient in pain? no      Comments no changes inmeds today   Physical Exam  Additional Exam:  WD, WN, 55 y/o WM in NAD... GENERAL:  Alert & oriented; pleasant & cooperative. HEENT:  [place]/AT, EOM-wnl, PERRLA, EACs-clear, TMs-wnl, NOSE-clear, THROAT-clear & wnl. NECK:  Supple w/ full ROM; no JVD; normal carotid impulses w/o bruits; no thyromegaly or nodules palpated; no lymphadenopathy. CHEST:  Clear to P & A; without wheezes/ rales/ or rhonchi.... HEART:  Regular Rhythm;  gr 2/6 AI murmur, w/o rubs or gallops detected... ABDOMEN:  s/p umbil hernia repair- well healed scar; normal bowel sounds; no organomegaly or masses palpated... EXT: without deformities or arthritic changes; no varicose veins/ venous insuffic/ or edema. NEURO:  CN's intact; motor testing normal; sensory testing normal; gait normal & balance OK. DERM:  No lesions noted; no rash etc...    Impression &  Recommendations:  Problem # 1:  UMBILICAL HERNIA (ICD-553.1) S/P surg- now welll healed and back to basely... Dr Derrell Lolling has released him.  Problem # 2:  AORTIC VALVE DISORDERS (ICD-424.1) AI murmur w/o change... no CP, palpit, SOB, dizzy, syncope, edema, etc... continue SBE prophylaxis. His updated medication list for this problem includes:    Aspir-low 81 Mg Tbec (Aspirin) .Marland Kitchen... Take 1 tablet by mouth once a day  Problem # 3:  HYPERCHOLESTEROLEMIA (ICD-272.0) He would benefit from Statin Rx but refuses- wants diet + exercise... fam hx is neg & he is encouraged to lose weight.  Problem # 4:  OTHER MEDICAL PROBLEMS AS NOTED>>>  Complete Medication List: 1)  Aspir-low 81 Mg Tbec (Aspirin) .... Take 1 tablet by mouth once a day 2)  Fish Oil 1000 Mg Caps (Omega-3 fatty acids) .... Take 1 tablet by mouth once a day 3)  Vicodin 5-500 Mg Tabs (Hydrocodone-acetaminophen) .... Take 1 tab by mouth every 6-8h as needed for pain...  Patient  Instructions: 1)  Today we updated your med list- see below.... 2)  We discussed diet + exercise program required to lose weight & improve your lipids if you want to do this w/o meds... 3)  Call for any questions.Marland KitchenMarland Kitchen 4)  Please schedule a follow-up appointment in 6 months.

## 2010-09-15 NOTE — Procedures (Signed)
NAME:  [name], [name] Anderson NO.:  [id number]   MEDICAL RECORD NO.:  [id number]          PATIENT TYPE:  OUT   LOCATION:  SLEEP CENTER                 FACILITY:  [facility]   PHYSICIAN:  Marcelyn Bruins, M.D. [facility] DATE OF BIRTH:  12-27-1955   DATE OF STUDY:  01/08/2005                              NOCTURNAL POLYSOMNOGRAM   REFERRING PHYSICIAN:  Dr. Alroy Dust   DATE OF STUDY:  January 08, 2005   INDICATION FOR THE STUDY:  Hypersomnia with sleep apnea.   EPWORTH SLEEPINESS SCORE:  7   SLEEP ARCHITECTURE:  The patient had a total sleep time of 372 minutes with  adequate slow wave sleep but very decreased REM. Sleep onset latency was  normal and REM onset was prolonged. Sleep efficiency was 85%.   RESPIRATORY DATA:  The patient was found to have 18 obstructive hypopneas  and three obstructive apneas which gave him a respiratory disturbance index  of 4 events per hour. There was mild to moderate snoring noted with the  events. There was also 283 nonspecific arousals, which when combined with  his snoring may be indicative of the upper airway resistance syndrome.   OXYGEN DATA:  The patient had O2 desaturation to 89%.   CARDIAC DATA:  Rare PVCs were noted.   MOVEMENT/PARASOMNIA:  The patient had small numbers of leg jerks; however,  there did seem to be some degree of sleep disruption. Clinical correlation  is suggested.   IMPRESSION/RECOMMENDATIONS:  1.  Small numbers of obstructive events which do not meet the respiratory      disturbance index criteria for the obstructive sleep apnea syndrome.      However, the patient did have mild to moderate snoring with numerous      nonspecific arousals which may suggest the upper airway resistance      syndrome.  2.  Small numbers of leg jerks with mild sleep disruption. Does the patient      have a clinical history consistent with this?           ______________________________  Marcelyn Bruins, M.D. [facility]  Diplomate, American  Board of Sleep  Medicine     KC/MEDQ  D:  01/18/2005 08:21:32  T:  01/18/2005 13:37:30  Job:  [id number]

## 2010-11-09 ENCOUNTER — Other Ambulatory Visit: Payer: Self-pay | Admitting: Pulmonary Disease

## 2011-02-14 ENCOUNTER — Encounter: Payer: Self-pay | Admitting: Internal Medicine

## 2011-06-14 ENCOUNTER — Encounter: Payer: Self-pay | Admitting: Internal Medicine

## 2011-06-27 ENCOUNTER — Ambulatory Visit (AMBULATORY_SURGERY_CENTER): Payer: 59 | Admitting: *Deleted

## 2011-06-27 VITALS — Ht 71.0 in | Wt 214.7 lb

## 2011-06-27 DIAGNOSIS — Z1211 Encounter for screening for malignant neoplasm of colon: Secondary | ICD-10-CM

## 2011-06-27 MED ORDER — MOVIPREP 100 G PO SOLR: ORAL | Status: DC

## 2011-07-10 ENCOUNTER — Ambulatory Visit (AMBULATORY_SURGERY_CENTER): Payer: 59 | Admitting: Internal Medicine

## 2011-07-10 ENCOUNTER — Encounter: Payer: Self-pay | Admitting: Internal Medicine

## 2011-07-10 VITALS — BP 134/79 | HR 72 | Temp 97.0°F | Resp 16 | Ht 71.0 in | Wt 214.0 lb

## 2011-07-10 DIAGNOSIS — Z1211 Encounter for screening for malignant neoplasm of colon: Secondary | ICD-10-CM

## 2011-07-10 DIAGNOSIS — Z860100 Personal history of colon polyps, unspecified: Secondary | ICD-10-CM | POA: Insufficient documentation

## 2011-07-10 MED ORDER — SODIUM CHLORIDE 0.9 % IV SOLN: 500.0000 mL | INTRAVENOUS | Status: DC

## 2011-07-10 NOTE — Progress Notes (Signed)
Patient did not experience any of the following events: a burn prior to discharge; a fall within the facility; wrong site/side/patient/procedure/implant event; or a hospital transfer or hospital admission upon discharge from the facility. (G8907) Patient did not have preoperative order for IV antibiotic SSI prophylaxis. (G8918)  

## 2011-07-10 NOTE — Patient Instructions (Addendum)
No polyps today! I recommend you have a routine repeat colonoscopy in about 7 years. Iva Boop, MD, FACG YOU HAD AN ENDOSCOPIC PROCEDURE TODAY AT THE [facility]: Refer to the procedure report that was given to you for any specific questions about what was found during the examination.  If the procedure report does not answer your questions, please call your gastroenterologist to clarify.  If you requested that your care partner not be given the details of your procedure findings, then the procedure report has been included in a sealed envelope for you to review at your convenience later.  YOU SHOULD EXPECT: Some feelings of bloating in the abdomen. Passage of more gas than usual.  Walking can help get rid of the air that was put into your GI tract during the procedure and reduce the bloating. If you had a lower endoscopy (such as a colonoscopy or flexible sigmoidoscopy) you may notice spotting of blood in your stool or on the toilet paper. If you underwent a bowel prep for your procedure, then you may not have a normal bowel movement for a few days.  DIET: Your first meal following the procedure should be a light meal and then it is ok to progress to your normal diet.  A half-sandwich or bowl of soup is an example of a good first meal.  Heavy or fried foods are harder to digest and may make you feel nauseous or bloated.  Likewise meals heavy in dairy and vegetables can cause extra gas to form and this can also increase the bloating.  Drink plenty of fluids but you should avoid alcoholic beverages for 24 hours.  ACTIVITY: Your care partner should take you home directly after the procedure.  You should plan to take it easy, moving slowly for the rest of the day.  You can resume normal activity the day after the procedure however you should NOT DRIVE or use heavy machinery for 24 hours (because of the sedation medicines used during the test).    SYMPTOMS TO REPORT IMMEDIATELY: A  gastroenterologist can be reached at any hour.  During normal business hours, 8:30 AM to 5:00 PM Monday through Friday, call [phone].  After hours and on weekends, please call the GI answering service at [phone] who will take a message and have the physician on call contact you.   Following lower endoscopy (colonoscopy or flexible sigmoidoscopy):  Excessive amounts of blood in the stool  Significant tenderness or worsening of abdominal pains  Swelling of the abdomen that is new, acute  Fever of 100F or higher  Following upper endoscopy (EGD)  Vomiting of blood or coffee ground material  New chest pain or pain under the shoulder blades  Painful or persistently difficult swallowing  New shortness of breath  Fever of 100F or higher  Black, tarry-looking stools  FOLLOW UP: If any biopsies were taken you will be contacted by phone or by letter within the next 1-3 weeks.  Call your gastroenterologist if you have not heard about the biopsies in 3 weeks.  Our staff will call the home number listed on your records the next business day following your procedure to check on you and address any questions or concerns that you may have at that time regarding the information given to you following your procedure. This is a courtesy call and so if there is no answer at the home number and we have not heard from you through the emergency physician on call,  we will assume that you have returned to your regular daily activities without incident.  SIGNATURES/CONFIDENTIALITY: You and/or your care partner have signed paperwork which will be entered into your electronic medical record.  These signatures attest to the fact that that the information above on your After Visit Summary has been reviewed and is understood.  Full responsibility of the confidentiality of this discharge information lies with you and/or your care-partner.    Diverticulosis and high fiber teaching sheets given prior to  discharge.

## 2011-07-10 NOTE — Op Note (Signed)
 [facility] [address] [place], [place]  [address]  COLONOSCOPY PROCEDURE REPORT  PATIENT:  [name] Anderson, [name] Anderson  MR#:  [id number] BIRTHDATE:  10-17-1955, 55 yrs. old  GENDER:  male ENDOSCOPIST:  Iva Boop, MD, [facility]  PROCEDURE DATE:  07/10/2011 PROCEDURE:  Colonoscopy 84696 ASA CLASS:  Class I INDICATIONS:  surveillance and high-risk screening, history of pre-cancerous (adenomatous) colon polyps - 2 diminutive tubular adenomas at index colonoscopy 2007 MEDICATIONS:   These medications were titrated to patient response per physician's verbal order, Fentanyl 50 mcg IV, Versed 6 mg IV  DESCRIPTION OF PROCEDURE:   After the risks benefits and alternatives of the procedure were thoroughly explained, informed consent was obtained.  Digital rectal exam was performed and revealed no rectal masses and normal prostate.   The LB CF-[id number] endoscope was introduced through the anus and advanced to the cecum, which was identified by both the appendix and ileocecal valve, without limitations.  The quality of the prep was excellent, using MoviPrep.  The instrument was then slowly withdrawn as the colon was fully examined. <<PROCEDUREIMAGES>>  FINDINGS:  Moderate diverticulosis was found in the sigmoid colon. This was otherwise a normal examination of the colon. Right colon retroflexion performed.   Retroflexed views in the rectum revealed no abnormalities.    The time to cecum = 1:19 minutes. The scope was then withdrawn in 10:12 minutes from the cecum and the procedure completed. COMPLICATIONS:  None ENDOSCOPIC IMPRESSION: 1) Moderate diverticulosis in the sigmoid colon 2) Otherwise normal examination - excellent prep 3) Personal history of diminutive adenoma removal (2) in 2007  REPEAT EXAM:  In 7 year(s) for routine screening colonoscopy.  Iva Boop, MD, [facility]  CC:  The Patient  n. eSIGNED:   Iva Boop at 07/10/2011 02:05 PM  Rene Paci, [phone]

## 2011-07-11 ENCOUNTER — Telehealth: Payer: Self-pay | Admitting: *Deleted

## 2011-07-11 NOTE — Telephone Encounter (Signed)
  Follow up Call-  Call back number 07/10/2011  Post procedure Call Back phone  # [phone] cell  Permission to leave phone message Yes     Patient questions:  Do you have a fever, pain , or abdominal swelling? no Pain Score  0 *  Have you tolerated food without any problems? yes  Have you been able to return to your normal activities? yes  Do you have any questions about your discharge instructions: Diet   no Medications  no Follow up visit  no  Do you have questions or concerns about your Care? no  Actions: * If pain score is 4 or above: No action needed, pain <4.

## 2012-01-01 ENCOUNTER — Other Ambulatory Visit: Payer: Self-pay | Admitting: Pulmonary Disease

## 2012-01-28 ENCOUNTER — Ambulatory Visit
Admission: RE | Admit: 2012-01-28 | Discharge: 2012-01-28 | Disposition: A | Discharge status: Home / Self Care | Payer: No Typology Code available for payment source | Source: Ambulatory Visit | Attending: Occupational Medicine | Admitting: Occupational Medicine

## 2012-01-28 ENCOUNTER — Other Ambulatory Visit: Payer: Self-pay | Admitting: Occupational Medicine

## 2012-01-28 DIAGNOSIS — Z0000 Encounter for general adult medical examination without abnormal findings: Secondary | ICD-10-CM

## 2012-04-28 ENCOUNTER — Other Ambulatory Visit: Payer: Self-pay | Admitting: Dermatology

## 2012-09-09 ENCOUNTER — Other Ambulatory Visit: Payer: Self-pay | Admitting: Dermatology

## 2012-12-31 ENCOUNTER — Telehealth: Payer: Self-pay | Admitting: Pulmonary Disease

## 2012-12-31 NOTE — Telephone Encounter (Signed)
I spoke with Leigh and she advised ok to schedule the pt but to make him aware of SN retiring to make sure he wants to set an appt here or go ahead and establish with another MD. Pt wanted to see SN on more time so appt set. Carron Curie, CMA

## 2013-02-01 ENCOUNTER — Other Ambulatory Visit: Payer: Self-pay | Admitting: Pulmonary Disease

## 2013-02-03 ENCOUNTER — Telehealth: Payer: Self-pay | Admitting: Pulmonary Disease

## 2013-02-03 NOTE — Telephone Encounter (Signed)
I spoke with pt. He stated he is suppose to have dental work on Thursday. He reports he is suppose to take amox 500 mg 1 hr prior to any dental work but he needs a refill. Pt has not been seen since 2011. He has pending appt 02/11/13 w/ SN. Please advise thanks

## 2013-02-04 NOTE — Telephone Encounter (Signed)
Per SN---ok to send in this rx for the amox 500 mg  #4  Take 4 tabs by mouth 1 hour prior to procedure.   This has already been sent in for the pt.  thanks

## 2013-02-04 NOTE — Telephone Encounter (Signed)
Pt is aware and needed nothing further 

## 2013-02-11 ENCOUNTER — Ambulatory Visit (INDEPENDENT_AMBULATORY_CARE_PROVIDER_SITE_OTHER)
Admission: RE | Admit: 2013-02-11 | Discharge: 2013-02-11 | Disposition: A | Discharge status: Home / Self Care | Payer: 59 | Source: Ambulatory Visit | Attending: Pulmonary Disease | Admitting: Pulmonary Disease

## 2013-02-11 ENCOUNTER — Ambulatory Visit (INDEPENDENT_AMBULATORY_CARE_PROVIDER_SITE_OTHER): Payer: 59 | Admitting: Pulmonary Disease

## 2013-02-11 ENCOUNTER — Encounter: Payer: Self-pay | Admitting: Pulmonary Disease

## 2013-02-11 ENCOUNTER — Encounter (INDEPENDENT_AMBULATORY_CARE_PROVIDER_SITE_OTHER): Payer: Self-pay

## 2013-02-11 VITALS — BP 128/74 | HR 72 | Temp 98.3°F | Ht 71.0 in | Wt 210.8 lb

## 2013-02-11 DIAGNOSIS — Z0000 Encounter for general adult medical examination without abnormal findings: Secondary | ICD-10-CM

## 2013-02-11 DIAGNOSIS — C4491 Basal cell carcinoma of skin, unspecified: Secondary | ICD-10-CM

## 2013-02-11 DIAGNOSIS — Z860100 Personal history of colon polyps, unspecified: Secondary | ICD-10-CM

## 2013-02-11 DIAGNOSIS — K5730 Diverticulosis of large intestine without perforation or abscess without bleeding: Secondary | ICD-10-CM

## 2013-02-11 DIAGNOSIS — N200 Calculus of kidney: Secondary | ICD-10-CM

## 2013-02-11 DIAGNOSIS — R0609 Other forms of dyspnea: Secondary | ICD-10-CM

## 2013-02-11 DIAGNOSIS — E7800 Pure hypercholesterolemia, unspecified: Secondary | ICD-10-CM

## 2013-02-11 DIAGNOSIS — I359 Nonrheumatic aortic valve disorder, unspecified: Secondary | ICD-10-CM

## 2013-02-11 DIAGNOSIS — R0989 Other specified symptoms and signs involving the circulatory and respiratory systems: Secondary | ICD-10-CM

## 2013-02-11 MED ORDER — SILDENAFIL CITRATE 20 MG PO TABS: ORAL_TABLET | ORAL | Status: DC

## 2013-02-11 NOTE — Patient Instructions (Signed)
Bay, it was good seeing you again... Today we updated your med list in our EPIC system...    Continue your current medications the same...    dobn't forget to take a probiotic like ALIGN daily while you are on the Clindamycin from the dentist...  We wrote a new prescription for generic viagra (Sildenafil) 20mg  tabs to try 2-5 tabs as needed...  Today we did your follow up CXR & EKG... Please return to our lab one morning this week for your FASTING blood work...    We will contact you w/ the results when available...     If your cholesterol is similar to previous values- we will rec starting on a statin medication to improve these numbers...  We will arrange for a follow up 2DEchocardiogram to recheck the Aortic valve...  Continue your diet & exercise program...  Let's plan a follow up visit in 55yr, sooner if needed for problems.Marland KitchenMarland Kitchen

## 2013-02-12 ENCOUNTER — Other Ambulatory Visit (INDEPENDENT_AMBULATORY_CARE_PROVIDER_SITE_OTHER): Payer: 59

## 2013-02-12 DIAGNOSIS — Z0000 Encounter for general adult medical examination without abnormal findings: Secondary | ICD-10-CM

## 2013-02-12 LAB — BASIC METABOLIC PANEL
BUN: 16 mg/dL (ref 6–23)
CO2: 27 mEq/L (ref 19–32)
Calcium: 9.2 mg/dL (ref 8.4–10.5)
Chloride: 105 mEq/L (ref 96–112)
Creatinine, Ser: 1.1 mg/dL (ref 0.4–1.5)
GFR: 73.32 mL/min (ref >60.00)
Glucose, Bld: 101 mg/dL — ABNORMAL HIGH (ref 70–99)
Potassium: 4.7 mEq/L (ref 3.5–5.1)
Sodium: 139 mEq/L (ref 135–145)

## 2013-02-12 LAB — URINALYSIS
Bilirubin Urine: NEGATIVE
Hgb urine dipstick: NEGATIVE
Ketones, ur: NEGATIVE
Leukocytes, UA: NEGATIVE
Nitrite: NEGATIVE
Specific Gravity, Urine: 1.025 (ref 1.000–1.030)
Total Protein, Urine: NEGATIVE
Urine Glucose: NEGATIVE
Urobilinogen, UA: 0.2 (ref 0.0–1.0)
pH: 6 (ref 5.0–8.0)

## 2013-02-12 LAB — CBC WITH DIFFERENTIAL/PLATELET
Basophils Absolute: 0 10*3/uL (ref 0.0–0.1)
Basophils Relative: 0.5 % (ref 0.0–3.0)
Eosinophils Absolute: 0.3 10*3/uL (ref 0.0–0.7)
Eosinophils Relative: 4.4 % (ref 0.0–5.0)
HCT: 47.9 % (ref 39.0–52.0)
Hemoglobin: 16.3 g/dL (ref 13.0–17.0)
Lymphocytes Relative: 29.1 % (ref 12.0–46.0)
Lymphs Abs: 2 10*3/uL (ref 0.7–4.0)
MCHC: 34.1 g/dL (ref 30.0–36.0)
MCV: 87.4 fl (ref 78.0–100.0)
Monocytes Absolute: 0.8 10*3/uL (ref 0.1–1.0)
Monocytes Relative: 10.9 % (ref 3.0–12.0)
Neutro Abs: 3.8 10*3/uL (ref 1.4–7.7)
Neutrophils Relative %: 55.1 % (ref 43.0–77.0)
Platelets: 255 10*3/uL (ref 150.0–400.0)
RBC: 5.48 Mil/uL (ref 4.22–5.81)
RDW: 13.9 % (ref 11.5–14.6)
WBC: 6.9 10*3/uL (ref 4.5–10.5)

## 2013-02-12 LAB — LDL CHOLESTEROL, DIRECT: Direct LDL: 160.9 mg/dL

## 2013-02-12 LAB — HEPATIC FUNCTION PANEL
ALT: 25 U/L (ref 0–53)
AST: 16 U/L (ref 0–37)
Albumin: 4.1 g/dL (ref 3.5–5.2)
Alkaline Phosphatase: 66 U/L (ref 39–117)
Bilirubin, Direct: 0.2 mg/dL (ref 0.0–0.3)
Total Bilirubin: 0.9 mg/dL (ref 0.3–1.2)
Total Protein: 7.1 g/dL (ref 6.0–8.3)

## 2013-02-12 LAB — LIPID PANEL
Cholesterol: 215 mg/dL — ABNORMAL HIGH (ref 0–200)
HDL: 38.4 mg/dL — ABNORMAL LOW (ref >39.00)
Total CHOL/HDL Ratio: 6
Triglycerides: 127 mg/dL (ref 0.0–149.0)
VLDL: 25.4 mg/dL (ref 0.0–40.0)

## 2013-02-12 LAB — TSH: TSH: 3.16 u[IU]/mL (ref 0.35–5.50)

## 2013-02-12 LAB — PSA: PSA: 0.37 ng/mL (ref 0.10–4.00)

## 2013-02-16 ENCOUNTER — Other Ambulatory Visit: Payer: Self-pay | Admitting: Pulmonary Disease

## 2013-02-16 MED ORDER — ATORVASTATIN CALCIUM 20 MG PO TABS: 20.0000 mg | ORAL_TABLET | Freq: Every day | ORAL | Status: DC

## 2013-02-25 ENCOUNTER — Ambulatory Visit (HOSPITAL_COMMUNITY): Payer: 59 | Attending: Pulmonary Disease

## 2013-02-25 DIAGNOSIS — R0609 Other forms of dyspnea: Secondary | ICD-10-CM | POA: Insufficient documentation

## 2013-02-25 DIAGNOSIS — E785 Hyperlipidemia, unspecified: Secondary | ICD-10-CM | POA: Insufficient documentation

## 2013-02-25 DIAGNOSIS — I359 Nonrheumatic aortic valve disorder, unspecified: Secondary | ICD-10-CM | POA: Insufficient documentation

## 2013-02-25 DIAGNOSIS — R0989 Other specified symptoms and signs involving the circulatory and respiratory systems: Secondary | ICD-10-CM | POA: Insufficient documentation

## 2013-02-25 NOTE — Progress Notes (Signed)
Echocardiogram performed.  

## 2013-02-25 NOTE — Progress Notes (Signed)
Subjective:     Patient ID: [name] Anderson, male   DOB: 10-04-1955, 57 y.o.   MRN: [id number]  HPI 57 y/o WM here for a follow up visit...  ~  February 17, 2009:  [name] Anderson was last seen 8/06... on no regular meds... he works for the [employer] and gets annual eval w/ labs there & he will have these labs forwarded to me for review... his CC is mid- abd discomfort/ pain x 3-4 months> periumbilical, intermittent, getting worse- rated 5-6/10, occuring 2-3 d per week, precipitated by lifting, & hasn't tried anything for relief- but notes pain decr if supine... assoc w/ "knot" around the belly button that is tender... denies n/v, denies change in BM/d/c/blood... exam shows a tender umbilical hernia & we discussed refer to [facility] for Rx.  ~  August 12, 2009:  he had umbil hernia repair 2/11 by DrIngram> he reports doing well & has recovered nicely & released by the surgeon... otherw feeling well & back to baseline> we reviewed his prev data & encouraged weight reduction, diet, exercise.  ~  March 13, 2010:  his only meds= ASA & Fish Oil but his Chol remains signif elev & he needs Statin Rx but declines to start this needed medication "I'll incr Fish Oil" & I explained that this won't get him to goal- offered second opinion w/ Cards vs Endocrine or Lipid Clinic but he declines my offers & prefers diet Rx alone + his Fish POil supplements... he also has signif snoring problem w/ prev ENT surg in past but he continues to snore & pt or wife freq sleep in another room because of the noise- he denies daytime hypersomnolence, sleep pressure, etc... I reviewed prev sleep study & recs from DrClance but he declines sleep med f/u, dental appliance, or CPAP trial...  hx Ao valve dis w/ mod AI & he knows to use Amox prior to dental work> denies CP, palpit, SOB, edema, etc...  ~  February 11, 2013:  35 month ROV & CPX>  [name] Anderson called to sched a f/u CPX after a 44yr hiatus, he tells me he retired from [employer] approx 1 yr ago;  He  is sched for a root canal next wk & the Endodontist gave him Clindamycin as pre-med;  His CC= ED and he would like Rx for Sildenafil 20mg  #50 to fill via [facility]...  We reviewed the following medical problems during today's office visit >>     Snoring> SEE BELOW- states the situation is about the same & he freq has to move into a sep bedroom at night due to snoring bothers wife; offered f/u sleep eval w/ [facility] & he will consider this...     Ao Valve Disease & AbnEKG> 2DEcho 11/10 showed mild-modAI & rec for SBE prophylaxis; he remains asymptomatic w/o CP, palpit, SOB/DOE, dizziness, edema, etc; we will repeat his 2DEcho=> see below    Chol> on diet alone; FLP 10/14 shows TChol 215, TG 127, HDL 38, LDL 161; Rec to start Atorva20 & f/u FLP in 59mo...    Colon polyps> Hx adenoma removed 9/07 by DrGessner; f/u colon 3/13 showed divertics, no recurrent polyps, & rec to repeat colon again in 86yrs...     Hx umbil hernia> s/p umbil hernia repair 2/11 by DrHIngram...    Kidney stones> followed by DrDahlstedt, last stone passed in 2010, no current symptoms...    LBP> hx LBP in 2004 w/ eval by DrBeane; treated w/ Celebrex, Robaxin, &  PT w/ improvement & he denies recent back pain symptoms...    Hx skin cancer> followed by DrTafeen w/ basal cell removed from right ear/ right eyebrow/ & back; melanotic nevus removed from left side of nose, etc; he knows to use sun block, wide brim hat, etc... 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 CXR 10/14 showed normal heart size, clear lungs, NAD.Marland KitchenMarland Kitchen  EKG 10/14 showed NSR, rate77, inferiorQs, otherw NAD... 2DEcho 10/14 showed norm LV size/ wall thickness/ & function w/ EF=55-60%; no regional wall motion abn, Gr2DD; AoV is trileaflet, no AS, at least mod AI (eccentric), mild Ao root dil...  We will refer to Cards to establish w/ them. LABS 10/14:  FLP- not at goals on diet alone w/ LDL=161;  Chems- wnl;   CBC- wnl;  TSH=3.16;  PSA=0.37;  UA- clear...           Current Problems:   SNORING (ICD-786.09) - long hx of snoring problems> hx some mild daytime symptoms... ~  he had a nasal septal repair & uvulectomy yrs ago by [employer]... 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 ~  eval 2007 by DrClance & offer oral appliance vs CPAP trial but pt couldn't make up his mind... ~  11/11: he notes that he or wife freq have to sleep in another room due to his snoring>  offered sleep f/u w/ DrClance, consideration of oral appliance, vs CPAP trial- but he declines all interventions... ~  10/14: he states the situation is about the same & he freq has to move into a sep bedroom at night due to snoring bothers wife; offered f/u sleep eval w/ KC & he will consider this.  AORTIC VALVE DISORDERS (ICD-424.1) - no prev known hx of valvular heart disease... he denies CP, palpit, SOB/ DOE, edema, etc... 2DEcho 11/10 w/ mild-mod AI & rec for SBE prophylaxis w/ AMOX. ~  CXR 10/10 showed normal- norm hrt size, & Ao, clear lungs... ~  EKG 1/10 shows NSR, WNL.Marland Kitchen. ~  exam 10/10 showed gr 2/6 AI murmur & 2DEcho showed norm LV, mild DD, mild-mod AI & mild MR... ~  exam 11/11 w/ similar murmur & no change (he is asymptomatic). ~  10/14: similar exam & repeat 2DEcho showed norm LV size/ wall thickness/ & function w/ EF=55-60%; no regional wall motion abn, Gr2DD; AoV is trileaflet, no AS, at least mod AI (eccentric), mild Ao root dil...  We will refer to Cards to establish w/ them.  HYPERCHOLESTEROLEMIA (ICD-272.0) - he remains on diet + exercise therapy+ FISH OIL... his weight has been stable in the 205-210# range and can't seem to lose weight... he gets labs at fire dept/ [facility]. ~  last FLP here 2002 showed TChol 233, TG 120,  HDL 38, LDL 171... offered meds, prefers diet. ~  labs 8/10 [facility] showed TChol 219, TG 89, HDL 38, LDL 163... he does not want med Rx. ~  labs 8/11 from [facility] showed TChol 254, TG 120, HDL 44, LDL 186... he declined all offers for med rx or Lipid Clinic review etc. ~  on diet alone; FLP 10/14 shows TChol 215, TG 127, HDL 38, LDL 161; Rec to start Atorva20 & f/u FLP in  51mo.Marland KitchenMarland Kitchen  COLONIC POLYPS (ICD-211.3) ~  last colonoscopy 9/07 by Rodena Medin showed 2 sm polyps, largest 5mm, bx= tubular adenoma- f/u planned 51yrs. ~  f/u colon 3/13 showed divertics, no recurrent polyps, & rec to repeat colon again in 72yrs...   UMBILICAL HERNIA (ICD-553.1) - s/p umbil hernia repair 2/11 by DrHIngram...  NEPHROLITHIASIS (ICD-592.0) - followed by DrDahlstadt & last seen earlier in 2010- passed stone on his own w/ Flomax help... ~  11/11: he states that he passed another stone on his own about 3 months ago...  BACK PAIN, LUMBAR (ICD-724.2) - referred to [facility] for eval LBP... Rx w/ Celebrex, Robaxin, & PT w/ improvement in his discomfort...  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 ~  10/14: followed by DrTafeen w/ basal cell removed from right ear/ right eyebrow/ & back; melanotic nevus removed from left side of nose, etc; he knows to use sun block, wide brim hat, etc...  HEALTH MAINTENANCE >> ~  GI:  Followed by Rodena Medin- last colon 9/07 w/ sm tubular adenoma removed;  ~  GU:  He has seen DrDahlstedt for kid stones;  10/14 DRE is neg & PSA= 0.37... 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    Past Surgical History  Procedure Laterality Date  . Umbilical hernia repair  2011  . Colonoscopy  01/24/06, 07/10/11  . Mohs surgery  2002    Outpatient Encounter  Prescriptions as of 02/11/2013  Medication Sig Dispense Refill  . amoxicillin (AMOXIL) 500 MG capsule TAKE 4 CAPSULES BY MOUTH 1 HOUR PRIOR TO DENTAL APPOINTMENT  4 capsule  0  . Ascorbic Acid (VITAMIN C) 1000 MG tablet Take 1,000 mg by mouth daily.       Marland Kitchen aspirin 81 MG tablet Take 81 mg by mouth daily.      . fish oil-omega-3 fatty acids 1000 MG capsule Take 2 g by mouth daily.      . Multiple Vitamins-Minerals (ECHINACEA ACZ) CAPS Take 1 capsule by mouth daily. Takes 2 daily      . sildenafil (REVATIO) 20 MG tablet Take 2-5 tablets as directed  50 tablet  5   No facility-administered encounter medications on file as of 02/11/2013.    No Known Allergies   Family History  Problem Relation Age of Onset  . Colon cancer Father   . Heart disease Paternal Grandmother     History   Social History  . Marital Status: Married    Spouse Name: N/A    Number of Children: N/A  . Years of Education: N/A   Occupational History  . Not on file.   Social History Main Topics  . Smoking status: Current Some Day Smoker    Types: Pipe  . Smokeless tobacco: Former Neurosurgeon    Quit date: 04/25/2010     Comment: occasional pipe  . Alcohol Use: No  . Drug Use: No  . Sexual Activity: Not on file   Other Topics Concern  . Not on file   Social History Narrative  . No narrative on file    Current Medications, Allergies, Past Medical History, Past Surgical History, Family History, and Social History were reviewed in [employer] record.   Review  of Systems         See HPI > The patient denies anorexia, fever, weight loss, weight gain, vision loss, decreased hearing, hoarseness, chest pain, syncope, dyspnea on exertion, peripheral edema, prolonged cough, headaches, hemoptysis, abdominal pain, melena, hematochezia, severe indigestion/heartburn, hematuria, incontinence, muscle weakness, suspicious skin lesions, transient blindness, difficulty walking, depression, unusual weight  change, abnormal bleeding, enlarged lymph nodes, and angioedema.     Objective:   Physical Exam     WD, WN, 57 y/o WM in NAD... GENERAL:  Alert & oriented; pleasant & cooperative. HEENT:  [place]/AT, EOM-wnl, PERRLA, EACs-clear, TMs-wnl, NOSE-clear, THROAT-clear & wnl. NECK:  Supple w/ full ROM; no JVD; normal carotid impulses w/o bruits; no thyromegaly or nodules palpated; no lymphadenopathy. CHEST:  Clear to P & A; without wheezes/ rales/ or rhonchi.... HEART:  Regular Rhythm;  gr 2/6 AI murmur, w/o rubs or gallops detected... ABDOMEN:  s/p umbil hernia repair- well healed scar; normal bowel sounds; no organomegaly or masses palpated... RECTAL:  prostate 2+ normal, stool heme neg... EXT: without deformities or arthritic changes; no varicose veins/ venous insuffic/ or edema. NEURO:  CN's intact; motor testing normal; sensory testing normal; gait normal & balance OK. DERM:  No lesions noted; no rash etc...  RADIOLOGY DATA:  Reviewed in the EPIC EMR & discussed w/ the patient...  LABORATORY DATA:  Reviewed in the EPIC EMR & discussed w/ the patient...   Assessment:      Snoring> he states the situation is about the same & he freq has to move into a sep bedroom at night due to snoring bothers wife; offered f/u sleep eval w/ KC & he will consider this...   Ao Valve Disease & AbnEKG> EKG w/ inferior Q's & 2DEcho 10/14 shows at least modAI; he remains asymptomatic, uses SBE prophylaxis, & we will refer to CARDS to establish w/ them...  Chol> on diet alone; FLP 10/14 shows TChol 215, TG 127, HDL 38, LDL 161; Rec to start Atorva20 & f/u FLP in 21mo.. . Colon polyps> f/u colon 3/13 showed divertics, no recurrent polyps, & rec to repeat colon again in 9yrs...   Hx umbil hernia> s/p umbil hernia repair 2/11 by DrHIngram...  Kidney stones> followed by DrDahlstedt, last stone passed in 2010, no current symptoms...  LBP> hx LBP in 2004 w/ eval by DrBeane; treated w/ Celebrex, Robaxin, & PT w/  improvement & he denies recent back pain symptoms...  Hx skin cancer> followed by DrTafeen w/ basal cell removed from right ear/ right eyebrow/ & back; melanotic nevus removed from left side of nose, etc; he knows to use sun block, wide brim hat, etc...     Plan:     Patient's Medications  New Prescriptions   ATORVASTATIN (LIPITOR) 20 MG TABLET    Take 1 tablet (20 mg total) by mouth daily.   SILDENAFIL (REVATIO) 20 MG TABLET    Take 2-5 tablets as directed  Previous Medications   AMOXICILLIN (AMOXIL) 500 MG CAPSULE    TAKE 4 CAPSULES BY MOUTH 1 HOUR PRIOR TO DENTAL APPOINTMENT   ASCORBIC ACID (VITAMIN C) 1000 MG TABLET    Take 1,000 mg by mouth daily.    ASPIRIN 81 MG TABLET    Take 81 mg by mouth daily.   FISH OIL-OMEGA-3 FATTY ACIDS 1000 MG CAPSULE    Take 2 g by mouth daily.   MULTIPLE VITAMINS-MINERALS (ECHINACEA ACZ) CAPS    Take 1 capsule by mouth daily. Takes 2 daily  Modified Medications  No medications on file  Discontinued Medications   No medications on file

## 2013-02-26 ENCOUNTER — Other Ambulatory Visit (HOSPITAL_COMMUNITY): Payer: 59

## 2013-02-27 ENCOUNTER — Other Ambulatory Visit: Payer: Self-pay | Admitting: Pulmonary Disease

## 2013-02-27 DIAGNOSIS — I359 Nonrheumatic aortic valve disorder, unspecified: Secondary | ICD-10-CM

## 2013-02-27 MED ORDER — AMOXICILLIN 500 MG PO CAPS: ORAL_CAPSULE | ORAL | Status: DC

## 2013-03-24 ENCOUNTER — Encounter: Payer: Self-pay | Admitting: Cardiology

## 2013-03-25 ENCOUNTER — Other Ambulatory Visit: Payer: Self-pay | Admitting: *Deleted

## 2013-03-30 ENCOUNTER — Encounter: Payer: Self-pay | Admitting: Cardiology

## 2013-03-30 ENCOUNTER — Ambulatory Visit (INDEPENDENT_AMBULATORY_CARE_PROVIDER_SITE_OTHER): Payer: 59 | Admitting: Cardiology

## 2013-03-30 VITALS — BP 122/90 | HR 80 | Ht 71.0 in | Wt 211.0 lb

## 2013-03-30 DIAGNOSIS — I359 Nonrheumatic aortic valve disorder, unspecified: Secondary | ICD-10-CM

## 2013-03-30 DIAGNOSIS — I77810 Thoracic aortic ectasia: Secondary | ICD-10-CM

## 2013-03-30 NOTE — Progress Notes (Signed)
     HPI: 57 year old male for evaluation of aortic insufficiency. Echocardiogram in November of 2010 showed mild to moderate aortic insufficiency. Echocardiogram in October of 2014 showed normal LV function, grade 2 diastolic dysfunction, eccentric aortic insufficiency probable moderate in severity. The aorta was mildly dilated. Because of his aortic insufficiency we were asked to evaluate. Patient has dyspnea with more vigorous activities but not routine activities. No orthopnea, PND, pedal edema, chest pain, palpitations or syncope.  Current Outpatient Prescriptions  Medication Sig Dispense Refill  . amoxicillin (AMOXIL) 500 MG capsule TAKE 4 CAPSULES BY MOUTH 1 HOUR PRIOR TO DENTAL APPOINTMENT  4 capsule  1  . Ascorbic Acid (VITAMIN C) 1000 MG tablet Take 1,000 mg by mouth daily.       Marland Kitchen aspirin 81 MG tablet Take 81 mg by mouth daily.      Marland Kitchen atorvastatin (LIPITOR) 20 MG tablet Take 1 tablet (20 mg total) by mouth daily.  90 tablet  3  . fish oil-omega-3 fatty acids 1000 MG capsule Take 2 g by mouth daily.      . Multiple Vitamins-Minerals (ECHINACEA ACZ) CAPS Take 1 capsule by mouth daily. Takes 2 daily      . sildenafil (REVATIO) 20 MG tablet Take 2-5 tablets as directed  50 tablet  5   No current facility-administered medications for this visit.    No Known Allergies  Past Medical History  Diagnosis Date  . Seasonal allergies   . Skin cancer 2002     nose  . Personal history of colonic polyps   . Aortic insufficiency   . Hyperlipidemia   . Nephrolithiasis     Past Surgical History  Procedure Laterality Date  . Umbilical hernia repair  2011  . Colonoscopy  01/24/06, 07/10/11  . Mohs surgery  2002    History   Social History  . Marital Status: Married    Spouse Name: N/A    Number of Children: 2  . Years of Education: N/A   Occupational History  .  [employer]   Social History Main Topics  . Smoking status: Current Some Day Smoker    Types: Pipe  .  Smokeless tobacco: Former Neurosurgeon    Quit date: 04/25/2010     Comment: occasional pipe  . Alcohol Use: Yes     Comment: Occasional  . Drug Use: No  . Sexual Activity: Not on file   Other Topics Concern  . Not on file   Social History Narrative  . No narrative on file    Family History  Problem Relation Age of Onset  . Colon cancer Father   . Heart disease Paternal Grandmother     ROS: no fevers or chills, productive cough, hemoptysis, dysphasia, odynophagia, melena, hematochezia, dysuria, hematuria, rash, seizure activity, orthopnea, PND, pedal edema, claudication. Remaining systems are negative.  Physical Exam:   Blood pressure 122/90, pulse 80, height 5\' 11"  (1.803 m), weight 211 lb (95.709 kg).  General:  Well developed/well nourished in NAD Skin warm/dry Patient not depressed No peripheral clubbing Back-normal HEENT-normal/normal eyelids Neck supple/normal carotid upstroke bilaterally; no bruits; no JVD; no thyromegaly chest - CTA/ normal expansion CV - RRR/normal S1 and S2; no rubs or gallops;  PMI nondisplaced, 2/6 diastolic murmur left sternal border. Abdomen -NT/ND, no HSM, no mass, + bowel sounds, no bruit 2+ femoral pulses, no bruits Ext-no edema, chords, 2+ DP Neuro-grossly nonfocal  ECG 02/11/2013-sinus rhythm, prior inferior infarct.

## 2013-03-30 NOTE — Assessment & Plan Note (Signed)
Patient is felt to have moderate aortic insufficiency on most recent echocardiogram. He is not having symptoms of dyspnea. His LV function is normal and there is no left ventricular dilatation. He will need followup echocardiograms in the future. His diastolic blood pressure is mildly elevated today. However he states it is typically controlled. I have asked him to follow his blood pressure closely. We will need to make sure his blood pressure is controlled in light of his aortic insufficiency.

## 2013-03-30 NOTE — Patient Instructions (Signed)
Your physician wants you to follow-up in: 6 months with Dr Jens Som. You will receive a reminder letter in the mail two months in advance. If you don't receive a letter, please call our office to schedule the follow-up appointment.   Non-Cardiac CT Angiography (CTA), is a special type of CT scan that uses a computer to produce multi-dimensional views of major blood vessels throughout the body. In CT angiography, a contrast material is injected through an IV to help visualize the blood vessels

## 2013-03-30 NOTE — Assessment & Plan Note (Signed)
Mildly dilated on echocardiogram. Given aortic insufficiency I will plan a CTA to better assess his aortic root and thoracic aorta.

## 2013-03-30 NOTE — Assessment & Plan Note (Signed)
Continue statin. followed by primary care. 

## 2013-04-03 ENCOUNTER — Ambulatory Visit (INDEPENDENT_AMBULATORY_CARE_PROVIDER_SITE_OTHER)
Admission: RE | Admit: 2013-04-03 | Discharge: 2013-04-03 | Disposition: A | Discharge status: Home / Self Care | Payer: 59 | Source: Ambulatory Visit | Attending: Cardiology | Admitting: Cardiology

## 2013-04-03 DIAGNOSIS — I77810 Thoracic aortic ectasia: Secondary | ICD-10-CM

## 2013-04-03 MED ORDER — IOHEXOL 350 MG/ML SOLN
100.0000 mL | Freq: Once | INTRAVENOUS | Status: AC | PRN
  Administered 2013-04-03: 100 mL via INTRAVENOUS

## 2013-04-07 ENCOUNTER — Other Ambulatory Visit: Payer: Self-pay | Admitting: Dermatology

## 2013-07-06 ENCOUNTER — Other Ambulatory Visit: Payer: Self-pay | Admitting: Pulmonary Disease

## 2013-08-10 ENCOUNTER — Telehealth: Payer: Self-pay | Admitting: Pulmonary Disease

## 2013-08-10 NOTE — Telephone Encounter (Signed)
Spoke with pt. Oct appt has been cancelled and is aware SN retired from [facility] [employer] 07/29/13. He is going to look into other providers.

## 2013-09-08 ENCOUNTER — Other Ambulatory Visit: Payer: Self-pay | Admitting: Urology

## 2013-09-08 ENCOUNTER — Encounter (HOSPITAL_BASED_OUTPATIENT_CLINIC_OR_DEPARTMENT_OTHER): Payer: Self-pay | Admitting: *Deleted

## 2013-09-08 NOTE — Progress Notes (Signed)
NPO AFTER MN. ARRIVE AT 1015.  NEEDS HG.  CURRENT EKG IN CHART AND EPIC.

## 2013-09-11 ENCOUNTER — Ambulatory Visit (HOSPITAL_BASED_OUTPATIENT_CLINIC_OR_DEPARTMENT_OTHER)
Admission: RE | Admit: 2013-09-11 | Discharge: 2013-09-11 | Disposition: A | Discharge status: Home / Self Care | Payer: 59 | Source: Ambulatory Visit | Attending: Urology | Admitting: Urology

## 2013-09-11 ENCOUNTER — Ambulatory Visit (HOSPITAL_BASED_OUTPATIENT_CLINIC_OR_DEPARTMENT_OTHER): Payer: 59 | Admitting: Anesthesiology

## 2013-09-11 ENCOUNTER — Encounter (HOSPITAL_BASED_OUTPATIENT_CLINIC_OR_DEPARTMENT_OTHER)
Admission: RE | Disposition: A | Discharge status: Home / Self Care | Payer: Self-pay | Source: Ambulatory Visit | Attending: Urology

## 2013-09-11 ENCOUNTER — Encounter (HOSPITAL_BASED_OUTPATIENT_CLINIC_OR_DEPARTMENT_OTHER): Payer: Self-pay

## 2013-09-11 ENCOUNTER — Encounter (HOSPITAL_BASED_OUTPATIENT_CLINIC_OR_DEPARTMENT_OTHER): Payer: 59 | Admitting: Anesthesiology

## 2013-09-11 DIAGNOSIS — C801 Malignant (primary) neoplasm, unspecified: Secondary | ICD-10-CM

## 2013-09-11 DIAGNOSIS — I359 Nonrheumatic aortic valve disorder, unspecified: Secondary | ICD-10-CM | POA: Insufficient documentation

## 2013-09-11 DIAGNOSIS — E785 Hyperlipidemia, unspecified: Secondary | ICD-10-CM | POA: Insufficient documentation

## 2013-09-11 DIAGNOSIS — R011 Cardiac murmur, unspecified: Secondary | ICD-10-CM | POA: Insufficient documentation

## 2013-09-11 DIAGNOSIS — N5089 Other specified disorders of the male genital organs: Secondary | ICD-10-CM

## 2013-09-11 DIAGNOSIS — Z87442 Personal history of urinary calculi: Secondary | ICD-10-CM | POA: Insufficient documentation

## 2013-09-11 DIAGNOSIS — Z85828 Personal history of other malignant neoplasm of skin: Secondary | ICD-10-CM | POA: Insufficient documentation

## 2013-09-11 DIAGNOSIS — Z860100 Personal history of colon polyps, unspecified: Secondary | ICD-10-CM | POA: Insufficient documentation

## 2013-09-11 DIAGNOSIS — N509 Disorder of male genital organs, unspecified: Secondary | ICD-10-CM | POA: Insufficient documentation

## 2013-09-11 DIAGNOSIS — G4733 Obstructive sleep apnea (adult) (pediatric): Secondary | ICD-10-CM | POA: Insufficient documentation

## 2013-09-11 DIAGNOSIS — Z87440 Personal history of urinary (tract) infections: Secondary | ICD-10-CM | POA: Insufficient documentation

## 2013-09-11 DIAGNOSIS — R0602 Shortness of breath: Secondary | ICD-10-CM | POA: Insufficient documentation

## 2013-09-11 DIAGNOSIS — F17200 Nicotine dependence, unspecified, uncomplicated: Secondary | ICD-10-CM | POA: Insufficient documentation

## 2013-09-11 DIAGNOSIS — C6290 Malignant neoplasm of unspecified testis, unspecified whether descended or undescended: Secondary | ICD-10-CM | POA: Insufficient documentation

## 2013-09-11 DIAGNOSIS — R918 Other nonspecific abnormal finding of lung field: Secondary | ICD-10-CM | POA: Insufficient documentation

## 2013-09-11 HISTORY — DX: Personal history of other malignant neoplasm of skin: Z85.828

## 2013-09-11 HISTORY — DX: Other specified disorders of the male genital organs: N50.89

## 2013-09-11 HISTORY — DX: Nonrheumatic aortic (valve) insufficiency: I35.1

## 2013-09-11 HISTORY — DX: Personal history of other malignant neoplasm of skin: Z98.890

## 2013-09-11 HISTORY — PX: SCROTAL EXPLORATION: SHX2386

## 2013-09-11 HISTORY — DX: Malignant (primary) neoplasm, unspecified: C80.1

## 2013-09-11 HISTORY — DX: Male erectile dysfunction, unspecified: N52.9

## 2013-09-11 HISTORY — DX: Personal history of urinary calculi: Z87.442

## 2013-09-11 HISTORY — PX: ORCHIECTOMY: SHX2116

## 2013-09-11 HISTORY — DX: Other nonspecific abnormal finding of lung field: R91.8

## 2013-09-11 HISTORY — DX: Obstructive sleep apnea (adult) (pediatric): G47.33

## 2013-09-11 LAB — POCT HEMOGLOBIN-HEMACUE: Hemoglobin: 15.7 g/dL (ref 13.0–17.0)

## 2013-09-11 SURGERY — EXPLORATION, SCROTUM
Anesthesia: General | Site: Abdomen | Laterality: Right

## 2013-09-11 MED ORDER — CEFAZOLIN SODIUM 1-5 GM-% IV SOLN
1.0000 g | INTRAVENOUS | Status: DC
Start: 2013-09-11 — End: 2013-09-11
  Filled 2013-09-11: qty 50

## 2013-09-11 MED ORDER — LACTATED RINGERS IV SOLN
INTRAVENOUS | Status: DC
  Administered 2013-09-11 (×2): via INTRAVENOUS
  Filled 2013-09-11: qty 1000

## 2013-09-11 MED ORDER — PROPOFOL 10 MG/ML IV BOLUS
INTRAVENOUS | Status: DC | PRN
  Administered 2013-09-11: 200 mg via INTRAVENOUS

## 2013-09-11 MED ORDER — SODIUM CHLORIDE 0.9 % IR SOLN
Status: DC | PRN
  Administered 2013-09-11: 12:00:00

## 2013-09-11 MED ORDER — FENTANYL CITRATE 0.05 MG/ML IJ SOLN
INTRAMUSCULAR | Status: DC | PRN
  Administered 2013-09-11 (×3): 50 ug via INTRAVENOUS
  Administered 2013-09-11: 25 ug via INTRAVENOUS

## 2013-09-11 MED ORDER — CEFAZOLIN SODIUM-DEXTROSE 2-3 GM-% IV SOLR
2.0000 g | INTRAVENOUS | Status: AC
Start: 2013-09-11 — End: 2013-09-11
  Administered 2013-09-11: 2 g via INTRAVENOUS
  Filled 2013-09-11: qty 50

## 2013-09-11 MED ORDER — LIDOCAINE HCL (CARDIAC) 20 MG/ML IV SOLN
INTRAVENOUS | Status: DC | PRN
  Administered 2013-09-11: 50 mg via INTRAVENOUS

## 2013-09-11 MED ORDER — FENTANYL CITRATE 0.05 MG/ML IJ SOLN
INTRAMUSCULAR | Status: AC
  Filled 2013-09-11: qty 6

## 2013-09-11 MED ORDER — HYDROCODONE-ACETAMINOPHEN 5-325 MG PO TABS: 1.0000 | ORAL_TABLET | ORAL | Status: DC | PRN

## 2013-09-11 MED ORDER — DEXAMETHASONE SODIUM PHOSPHATE 4 MG/ML IJ SOLN
INTRAMUSCULAR | Status: DC | PRN
  Administered 2013-09-11: 10 mg via INTRAVENOUS

## 2013-09-11 MED ORDER — MIDAZOLAM HCL 2 MG/2ML IJ SOLN
INTRAMUSCULAR | Status: AC
  Filled 2013-09-11: qty 2

## 2013-09-11 MED ORDER — FENTANYL CITRATE 0.05 MG/ML IJ SOLN
25.0000 ug | INTRAMUSCULAR | Status: DC | PRN
  Filled 2013-09-11: qty 1

## 2013-09-11 MED ORDER — BUPIVACAINE HCL (PF) 0.25 % IJ SOLN
INTRAMUSCULAR | Status: DC | PRN
  Administered 2013-09-11: 20 mL

## 2013-09-11 MED ORDER — LACTATED RINGERS IV SOLN
INTRAVENOUS | Status: DC
  Filled 2013-09-11: qty 1000

## 2013-09-11 MED ORDER — ONDANSETRON HCL 4 MG/2ML IJ SOLN
INTRAMUSCULAR | Status: DC | PRN
  Administered 2013-09-11: 4 mg via INTRAVENOUS

## 2013-09-11 MED ORDER — MIDAZOLAM HCL 5 MG/5ML IJ SOLN
INTRAMUSCULAR | Status: DC | PRN
  Administered 2013-09-11: 2 mg via INTRAVENOUS

## 2013-09-11 SURGICAL SUPPLY — 53 items
APPLICATOR COTTON TIP 6IN STRL (MISCELLANEOUS) IMPLANT
BANDAGE GAUZE ELAST BULKY 4 IN (GAUZE/BANDAGES/DRESSINGS) ×2 IMPLANT
BENZOIN TINCTURE PRP APPL 2/3 (GAUZE/BANDAGES/DRESSINGS) IMPLANT
BLADE SURG 15 STRL LF DISP TIS (BLADE) ×1 IMPLANT
BLADE SURG 15 STRL SS (BLADE) ×1
BLADE SURG ROTATE 9660 (MISCELLANEOUS) ×2 IMPLANT
BNDG GAUZE ELAST 4 BULKY (GAUZE/BANDAGES/DRESSINGS) ×2 IMPLANT
CANISTER SUCTION 1200CC (MISCELLANEOUS) ×2 IMPLANT
CLEANER CAUTERY TIP 5X5 PAD (MISCELLANEOUS) ×1 IMPLANT
CLOTH BEACON ORANGE TIMEOUT ST (SAFETY) ×2 IMPLANT
COVER MAYO STAND STRL (DRAPES) ×2 IMPLANT
COVER TABLE BACK 60X90 (DRAPES) ×2 IMPLANT
DERMABOND ADVANCED (GAUZE/BANDAGES/DRESSINGS) ×1
DERMABOND ADVANCED .7 DNX12 (GAUZE/BANDAGES/DRESSINGS) ×1 IMPLANT
DISSECTOR ROUND CHERRY 3/8 STR (MISCELLANEOUS) IMPLANT
DRAIN PENROSE 18X1/4 LTX STRL (WOUND CARE) ×2 IMPLANT
DRAPE PED LAPAROTOMY (DRAPES) ×2 IMPLANT
DRSG TEGADERM 4X4.75 (GAUZE/BANDAGES/DRESSINGS) ×2 IMPLANT
ELECT NEEDLE TIP 2.8 STRL (NEEDLE) ×2 IMPLANT
ELECT REM PT RETURN 9FT ADLT (ELECTROSURGICAL) ×2
ELECTRODE REM PT RTRN 9FT ADLT (ELECTROSURGICAL) ×1 IMPLANT
GAUZE SPONGE 4X4 12PLY STRL LF (GAUZE/BANDAGES/DRESSINGS) ×4 IMPLANT
GAUZE SPONGE 4X4 16PLY XRAY LF (GAUZE/BANDAGES/DRESSINGS) ×2 IMPLANT
GLOVE BIO SURGEON STRL SZ8 (GLOVE) ×2 IMPLANT
GOWN STRL REIN XL XLG (GOWN DISPOSABLE) ×2 IMPLANT
GOWN STRL REUS W/ TWL XL LVL3 (GOWN DISPOSABLE) ×2 IMPLANT
GOWN STRL REUS W/TWL LRG LVL3 (GOWN DISPOSABLE) ×2 IMPLANT
GOWN STRL REUS W/TWL XL LVL3 (GOWN DISPOSABLE) ×4 IMPLANT
NEEDLE HYPO 22GX1.5 SAFETY (NEEDLE) IMPLANT
NEEDLE HYPO 25X5/8 SAFETYGLIDE (NEEDLE) ×2 IMPLANT
NS IRRIG 500ML POUR BTL (IV SOLUTION) IMPLANT
PACK BASIN DAY SURGERY FS (CUSTOM PROCEDURE TRAY) ×2 IMPLANT
PAD CLEANER CAUTERY TIP 5X5 (MISCELLANEOUS) ×1
PENCIL BUTTON HOLSTER BLD 10FT (ELECTRODE) ×2 IMPLANT
STRIP CLOSURE SKIN 1/2X4 (GAUZE/BANDAGES/DRESSINGS) IMPLANT
SUPPORT SCROTAL LG STRP (MISCELLANEOUS) ×2 IMPLANT
SUT CHROMIC 2 0 SH (SUTURE) IMPLANT
SUT CHROMIC 3 0 SH 27 (SUTURE) IMPLANT
SUT CHROMIC 4 0 SH 27 (SUTURE) IMPLANT
SUT MNCRL AB 4-0 PS2 18 (SUTURE) ×2 IMPLANT
SUT MON AB 5-0 PS2 18 (SUTURE) IMPLANT
SUT SILK 0 TIES 10X30 (SUTURE) ×2 IMPLANT
SUT VIC AB 3-0 SH 27 (SUTURE) ×2
SUT VIC AB 3-0 SH 27X BRD (SUTURE) ×2 IMPLANT
SUT VICRYL 2 0 18  UND BR (SUTURE)
SUT VICRYL 2 0 18 UND BR (SUTURE) IMPLANT
SYR BULB IRRIGATION 50ML (SYRINGE) ×2 IMPLANT
SYR CONTROL 10ML LL (SYRINGE) IMPLANT
TOWEL OR 17X24 6PK STRL BLUE (TOWEL DISPOSABLE) ×4 IMPLANT
TRAY DSU PREP LF (CUSTOM PROCEDURE TRAY) ×2 IMPLANT
TUBE CONNECTING 12X1/4 (SUCTIONS) ×2 IMPLANT
WATER STERILE IRR 500ML POUR (IV SOLUTION) IMPLANT
YANKAUER SUCT BULB TIP NO VENT (SUCTIONS) ×2 IMPLANT

## 2013-09-11 NOTE — Discharge Instructions (Signed)
HOME CARE INSTRUCTIONS FOR SCROTAL PROCEDURES  Wound Care & Hygiene: You may apply an ice bag to the scrotum for the first 24 hours.  This may help decrease swelling and soreness.  You may have a dressing held in place by an athletic supporter.  You may remove the dressing in 24 hours and shower in 48 hours.  Continue to use the athletic supporter or tight briefs for at least a week.  Activity: Rest today - not necessarily flat bed rest.  Just take it easy.  You should not do strenuous activities until your follow-up visit with your doctor.  You may resume light activity in 48 hours.  Return to Work:  Your doctor will advise you of this depending on the type of work you do  Diet: Drink liquids or eat a light diet this evening.  You may resume a regular diet tomorrow.  General Expectations: You may have a small amount of bleeding.  The scrotum may be swollen or bruised for about a week.  Call your Doctor if these occur:  -persistent or heavy bleeding  -temperature of 101 degrees or more  -severe pain, not relieved by your pain medication   Patient Signature:  __________________________________________________  Nurse's Signature:  __________________________________________________   Post Anesthesia Home Care Instructions  Activity: Get plenty of rest for the remainder of the day. A responsible adult should stay with you for 24 hours following the procedure.  For the next 24 hours, DO NOT: -Drive a car -Paediatric nurse -Drink alcoholic beverages -Take any medication unless instructed by your physician -Make any legal decisions or sign important papers.  Meals: Start with liquid foods such as gelatin or soup. Progress to regular foods as tolerated. Avoid greasy, spicy, heavy foods. If nausea and/or vomiting occur, drink only clear liquids until the nausea and/or vomiting subsides. Call your physician if vomiting continues.  Special Instructions/Symptoms: Your throat may feel  dry or sore from the anesthesia or the breathing tube placed in your throat during surgery. If this causes discomfort, gargle with warm salt water. The discomfort should disappear within 24 hours.

## 2013-09-11 NOTE — H&P (Signed)
Urology History and Physical Exam  CC: Testicular mass.  HPI: 58 year old male presents for right inguinal exploration/probable inguinal orchiectomy. He presented earlier this week with a several week history of right scrotal swelling. He had a firm right testicle, and U/S confirmed a solid mass of the right testicle with some cystic changes within. His tumor markers (LDH, AFP and BHCG ) were all normal. He presents now for right inguinal exploration, probable orchiectomy.  PMH: Past Medical History  Diagnosis Date  . Seasonal allergies   . Personal history of colonic polyps     TUBULAR ADENOMA--   . Hyperlipidemia   . History of basal cell carcinoma excision     02/2002   --  NOSE  &   2013  RIGHT EAR  . Moderate aortic valve insufficiency   . Testicular mass     RIGHT  . Multiple pulmonary nodules     PER CT--  PROBABLE  GRANULOMATOUS  . Mild obstructive sleep apnea     PER STUDY 01-18-2005--  NO CPAP OR MOUTH GUARD  . History of kidney stones   . ED (erectile dysfunction)     PSH: Past Surgical History  Procedure Laterality Date  . Umbilical hernia repair  06-08-2009  . Colonoscopy  01/24/06, 07/10/11  . Mohs surgery  02/2002  &  2013    TIP OF NOSE-///     RIGHT EAR  . Transthoracic echocardiogram  02-25-2013   DR CRENSHAW    NORMAL LVSF/  EF 55-60%/   GRADE 2 DIASTOLIC DYSFUNCTION/  ECCENTRIC MODERATE AORTIC INSUFFICIENCY WITHOUT STENOSIS/  MILD DILATED AORTIC ROOT/ TRIVIAL MR, TR,  & PR  . Extracorporeal shock wave lithotripsy    . Ureteroscopic laser lithotripsy stone extraction  2007    Allergies: No Known Allergies  Medications: No prescriptions prior to admission     Social History: History   Social History  . Marital Status: Married    Spouse Name: N/A    Number of Children: 2  . Years of Education: N/A   Occupational History  .  Unemployed   Social History Main Topics  . Smoking status: Current Some Day Smoker    Types: Pipe  . Smokeless  tobacco: Former Systems developer    Types: [place] date: 04/26/2011     Comment: OCCASIONAL  PIPE SMOKER  ([place])  . Alcohol Use: Yes     Comment: Occasional  . Drug Use: No  . Sexual Activity: Not on file   Other Topics Concern  . Not on file   Social History Narrative  . No narrative on file    Family History: Family History  Problem Relation Age of Onset  . Colon cancer Father   . Heart disease Paternal Grandmother     Review of Systems: Genitourinary, constitutional, skin, eye, otolaryngeal, hematologic/lymphatic, cardiovascular, pulmonary, endocrine, musculoskeletal, gastrointestinal, neurological and psychiatric system(s) were reviewed and pertinent findings if present are noted.  Genitourinary: urinary frequency, erectile dysfunction and scrotal swelling.                     Physical Exam: @VITALS2 @ General: No acute distress.  Awake. Head:  Normocephalic.  Atraumatic. ENT:  EOMI.  Mucous membranes moist Neck:  Supple.  No lymphadenopathy. CV:  S1 present. S2 present. Regular rate. Pulmonary: Equal effort bilaterally.  Clear to auscultation bilaterally. Abdomen: Soft.  Nontender to palpation. Skin:  Normal turgor.  No visible rash. Extremity: No gross deformity  of bilateral upper extremities.  No gross deformity of                             lower extremities. Neurologic: Alert. Appropriate mood.  Penis:  Circumcised.  No lesions. Urethra: Orthotopic meatus. Scrotum: No lesions.  No ecchymosis.  No erythema. Testicles: Descended bilaterally.  Confluently large right testicle. Some nodularity. Epididymis: Palpable bilaterally. Nontender to palpation.  Studies:  No results found for this basename: HGB, WBC, PLT,  in the last 72 hours  No results found for this basename: NA, K, CL, CO2, BUN, CREATININE, CALCIUM, MAGNESIUM, GFRNONAA, GFRAA,  in the last 72 hours   No results found for this basename: PT, INR, APTT,  in the last 72 hours   No  components found with this basename: ABG,     Assessment:  Right testicular mass  Plan: Right inguinal exploration, possible right radical orchiectomy.

## 2013-09-11 NOTE — Transfer of Care (Signed)
Immediate Anesthesia Transfer of Care Note  Patient: [name] Anderson  Procedure(s) Performed: Procedure(s) (LRB): RIGHT INGUINAL EXPLORATION (Right)  RIGHT ORCHIECTOMY (Right)  Patient Location: PACU  Anesthesia Type: General  Level of Consciousness: awake, oriented, sedated and patient cooperative  Airway & Oxygen Therapy: Patient Spontanous Breathing and Patient connected to face mask oxygen  Post-op Assessment: Report given to PACU RN and Post -op Vital signs reviewed and stable  Post vital signs: Reviewed and stable  Complications: No apparent anesthesia complications

## 2013-09-11 NOTE — Anesthesia Preprocedure Evaluation (Addendum)
Anesthesia Evaluation  Patient identified by MRN, date of birth, ID band Patient awake    Reviewed: Allergy & Precautions, H&P , NPO status , Patient's Chart, lab work & pertinent test results  Airway Mallampati: III TM Distance: >3 FB Neck ROM: full    Dental no notable dental hx. (+) Teeth Intact, Dental Advisory Given   Pulmonary shortness of breath and with exertion, sleep apnea , Current Smoker,  Mild OSA breath sounds clear to auscultation  Pulmonary exam normal       Cardiovascular Exercise Tolerance: Good negative cardio ROS  + Valvular Problems/Murmurs AI Rhythm:regular Rate:Normal     Neuro/Psych negative neurological ROS  negative psych ROS   GI/Hepatic negative GI ROS, Neg liver ROS,   Endo/Other  negative endocrine ROS  Renal/GU negative Renal ROS  negative genitourinary   Musculoskeletal   Abdominal   Peds  Hematology negative hematology ROS (+)   Anesthesia Other Findings   Reproductive/Obstetrics negative OB ROS                         Anesthesia Physical Anesthesia Plan  ASA: III  Anesthesia Plan: General   Post-op Pain Management:    Induction: Intravenous  Airway Management Planned: LMA  Additional Equipment:   Intra-op Plan:   Post-operative Plan:   Informed Consent: I have reviewed the patients History and Physical, chart, labs and discussed the procedure including the risks, benefits and alternatives for the proposed anesthesia with the patient or authorized representative who has indicated his/her understanding and acceptance.   Dental Advisory Given  Plan Discussed with: CRNA and Surgeon  Anesthesia Plan Comments:         Anesthesia Quick Evaluation

## 2013-09-11 NOTE — Op Note (Signed)
PATIENT:  [name] Anderson  PRE-OPERATIVE DIAGNOSIS: Testicular mass, right  POST-OPERATIVE DIAGNOSIS: Same  PROCEDURE: Right inguinal orchiectomy  SURGEON:  Lillette Boxer. Jd Mccaster, M.D.  ANESTHESIA:  General  EBL:  Minimal  DRAINS: None  LOCAL MEDICATIONS USED:  20 mL quarter percent plain Marcaine  SPECIMEN:  Right testicle and cord  INDICATION: [name] Anderson is a 58 year old male who recently presented with a large right testicle. Scrotal ultrasound revealed significant cystic change and heterogeneity of the echo pattern. This is consistent with a testicular tumor. LDH, beta hCG and alpha-fetoprotein markers were drawn and all returned normal. He presents at this time for right inguinal orchiectomy. Risks and complications of the procedure have been discussed with the patient. He understands these and desires to proceed.  Description of procedure: The patient was properly identified and marked (if applicable) in the holding area. They were then  taken to the operating room and placed on the table in a supine position. General anesthesia was then administered. Once fully anesthetized the patient was placed in the recumbent position and the right lower abdomen, and genitalia and perineum were sterilely prepped and draped in standard fashion. An official timeout was then performed.  A 3 cm incision was then made over the right external inguinal ring. Using blunt dissection this was carried down to the external inguinal ring, with the cord structures identified. Care was taken to dissect the ilioinguinal nerve off of the cord, which was then dissected down distally. Once the cord was dissected, a Penrose drain was looped around it twice or compression. Dissection was carried distally along the cord, and the right testicle was up out of the incision with scrotal pressure. Gubernaculum was divided with electrocautery. At this point, it was evident that the testicle was somewhat hypervascular, with some  nodularity located anteriorly. I did not think, with the testicle being so large and hypervascular, the biopsy was necessary. The proximal cord was blocked with 10 cc of quarter percent plain Marcaine. I then used Kelly clamps to divide the cord and the 2 packets, the cord was then divided distal to the clamps. 0 silk ties were placed on each packet, with 2 ties placed on each one. The cord was then divided, and the cord and testicle sent for permanent pathology. The cord stump was hemostatic, and the cord stump was pushed up inside the inguinal canal. Inspection was made on the everted scrotum, and no bleeding was seen. The inguinal expiration was hemostatic as well. It was irrigated with antibiotic irrigation, and the subcutaneous tissue was re\re approximated using a 3-0 running Vicryl. Skin edges were reapproximated using 4-0 Monocryl placed in a running subcuticular fashion. Dermabond was placed on the skin. A bulky gauze dressing was placed on the scrotum, and then fixed with a scrotal support.  The patient tolerated the procedure well. Sponge needle and instrument counts were correct x2. He was awakened and taken to PACU in stable condition.      PLAN OF CARE: Discharge to home after PACU  PATIENT DISPOSITION:  PACU - hemodynamically stable.

## 2013-09-11 NOTE — Anesthesia Postprocedure Evaluation (Signed)
  Anesthesia Post-op Note  Patient: [name] Anderson  Procedure(s) Performed: Procedure(s) (LRB): RIGHT INGUINAL EXPLORATION (Right)  RIGHT ORCHIECTOMY (Right)  Patient Location: PACU  Anesthesia Type: General  Level of Consciousness: awake and alert   Airway and Oxygen Therapy: Patient Spontanous Breathing  Post-op Pain: mild  Post-op Assessment: Post-op Vital signs reviewed, Patient's Cardiovascular Status Stable, Respiratory Function Stable, Patent Airway and No signs of Nausea or vomiting  Last Vitals:  Filed Vitals:   09/11/13 1300  BP: 113/64  Pulse: 67  Temp:   Resp: 18    Post-op Vital Signs: stable   Complications: No apparent anesthesia complications

## 2013-09-11 NOTE — Anesthesia Procedure Notes (Signed)
Procedure Name: LMA Insertion Date/Time: 09/11/2013 12:00 PM Performed by: Denna Haggard D Pre-anesthesia Checklist: Patient identified, Emergency Drugs available, Suction available and Patient being monitored Patient Re-evaluated:Patient Re-evaluated prior to inductionOxygen Delivery Method: Circle System Utilized Preoxygenation: Pre-oxygenation with 100% oxygen Intubation Type: IV induction Ventilation: Mask ventilation without difficulty LMA: LMA inserted LMA Size: 4.0 Number of attempts: 1 Airway Equipment and Method: bite block Placement Confirmation: positive ETCO2 Tube secured with: Tape Dental Injury: Teeth and Oropharynx as per pre-operative assessment

## 2013-09-14 ENCOUNTER — Encounter (HOSPITAL_BASED_OUTPATIENT_CLINIC_OR_DEPARTMENT_OTHER): Payer: Self-pay | Admitting: Urology

## 2013-09-22 ENCOUNTER — Encounter (HOSPITAL_COMMUNITY): Payer: Self-pay

## 2013-09-28 ENCOUNTER — Ambulatory Visit: Payer: 59 | Admitting: Cardiology

## 2013-09-28 ENCOUNTER — Ambulatory Visit (INDEPENDENT_AMBULATORY_CARE_PROVIDER_SITE_OTHER): Payer: 59 | Admitting: Cardiology

## 2013-09-28 ENCOUNTER — Encounter: Payer: Self-pay | Admitting: Cardiology

## 2013-09-28 ENCOUNTER — Encounter: Payer: Self-pay | Admitting: *Deleted

## 2013-09-28 VITALS — BP 120/60 | HR 70 | Ht 71.0 in | Wt 210.8 lb

## 2013-09-28 DIAGNOSIS — I359 Nonrheumatic aortic valve disorder, unspecified: Secondary | ICD-10-CM

## 2013-09-28 DIAGNOSIS — E7800 Pure hypercholesterolemia, unspecified: Secondary | ICD-10-CM

## 2013-09-28 DIAGNOSIS — I77810 Thoracic aortic ectasia: Secondary | ICD-10-CM

## 2013-09-28 DIAGNOSIS — R918 Other nonspecific abnormal finding of lung field: Secondary | ICD-10-CM

## 2013-09-28 NOTE — Patient Instructions (Signed)
Your physician wants you to follow-up in: [place] will receive a reminder letter in the mail two months in advance. If you don't receive a letter, please call our office to schedule the follow-up appointment.   Your physician has requested that you have an echocardiogram. Echocardiography is a painless test that uses sound waves to create images of your heart. It provides your doctor with information about the size and shape of your heart and how well your heart's chambers and valves are working. This procedure takes approximately one hour. There are no restrictions for this procedure. SCHEDULE IN OCT  Non-Cardiac CT Angiography (CTA), is a special type of CT scan that uses a computer to produce multi-dimensional views of major blood vessels throughout the body. In CT angiography, a contrast material is injected through an IV to help visualize the blood vessels CTA OF THE CHEST WITH AND WITHOUT CONTRAST TO F/U DILATED AORTIC ROOT=SCHEDULE IN DEC PRIOR TO FOLLOW UP

## 2013-09-28 NOTE — Assessment & Plan Note (Signed)
Repeat CT scan in December 2015.

## 2013-09-28 NOTE — Assessment & Plan Note (Signed)
Plan followup CTA in December 2015.

## 2013-09-28 NOTE — Assessment & Plan Note (Signed)
Management per primary care. 

## 2013-09-28 NOTE — Progress Notes (Signed)
HPI: FU aortic insufficiency. Echocardiogram in November of 2010 showed mild to moderate aortic insufficiency. Echocardiogram in October of 2014 showed normal LV function, grade 2 diastolic dysfunction, eccentric aortic insufficiency probable moderate in severity. The aorta was mildly dilated. CTA in December of 2014 showed aortic root dilatation of between 4 and 4.3 cm. There were small pulmonary nodules noted and followup recommended in one year. Last seen December 2014. Since then, the patient has dyspnea with more extreme activities but not with routine activities. It is relieved with rest. It is not associated with chest pain. There is no orthopnea, PND or pedal edema. There is no syncope or palpitations. There is no exertional chest pain.    Current Outpatient Prescriptions  Medication Sig Dispense Refill  . amoxicillin (AMOXIL) 500 MG capsule TAKE 4 TABLETS BY MOUTH 1 HOUR PRIOR TO DENTAL APPOINTMENT  4 capsule  1  . Ascorbic Acid (VITAMIN C) 1000 MG tablet Take 1,000 mg by mouth daily as needed. DURING WINTER      . aspirin 81 MG tablet Take 81 mg by mouth daily.      Marland Kitchen atorvastatin (LIPITOR) 20 MG tablet Take 20 mg by mouth every morning.      . fish oil-omega-3 fatty acids 1000 MG capsule Take 2 g by mouth daily.      Marland Kitchen HYDROcodone-acetaminophen (NORCO) 5-325 MG per tablet Take 1-2 tablets by mouth every 4 (four) hours as needed for moderate pain.  30 tablet  0  . Multiple Vitamin (MULTIVITAMIN) tablet Take 1 tablet by mouth daily.      . Multiple Vitamins-Minerals (ECHINACEA ACZ) CAPS Take 2 capsules by mouth daily as needed. DURING WINTER--      . sildenafil (REVATIO) 20 MG tablet Take 20 mg by mouth daily as needed.        No current facility-administered medications for this visit.     Past Medical History  Diagnosis Date  . Seasonal allergies   . Personal history of colonic polyps     TUBULAR ADENOMA--   . Hyperlipidemia   . History of basal cell carcinoma excision      02/2002   --  NOSE  &   2013  RIGHT EAR  . Moderate aortic valve insufficiency   . Testicular mass     RIGHT  . Multiple pulmonary nodules     PER CT--  PROBABLE  GRANULOMATOUS  . Mild obstructive sleep apnea     PER STUDY 01-18-2005--  NO CPAP OR MOUTH GUARD  . History of kidney stones   . ED (erectile dysfunction)     Past Surgical History  Procedure Laterality Date  . Umbilical hernia repair  06-08-2009  . Colonoscopy  01/24/06, 07/10/11  . Mohs surgery  02/2002  &  2013    TIP OF NOSE-///     RIGHT EAR  . Transthoracic echocardiogram  02-25-2013   DR CRENSHAW    NORMAL LVSF/  EF 55-60%/   GRADE 2 DIASTOLIC DYSFUNCTION/  ECCENTRIC MODERATE AORTIC INSUFFICIENCY WITHOUT STENOSIS/  MILD DILATED AORTIC ROOT/ TRIVIAL MR, TR,  & PR  . Extracorporeal shock wave lithotripsy    . Ureteroscopic laser lithotripsy stone extraction  2007  . Scrotal exploration Right 09/11/2013    Procedure: RIGHT INGUINAL EXPLORATION;  Surgeon: Jorja Loa, MD;  Location[facility];  Service: Urology;  Laterality: Right;  . Orchiectomy Right 09/11/2013    Procedure:  RIGHT ORCHIECTOMY;  Surgeon: Lillette Boxer Dahlstedt,  MD;  Location: [place] [facility];  Service: Urology;  Laterality: Right;    History   Social History  . Marital Status: Married    Spouse Name: N/A    Number of Children: 2  . Years of Education: N/A   Occupational History  .  Unemployed   Social History Main Topics  . Smoking status: Current Some Day Smoker    Types: Pipe  . Smokeless tobacco: Former Systems developer    Types: [place] date: 04/26/2011     Comment: OCCASIONAL  PIPE SMOKER  ([place])  . Alcohol Use: Yes     Comment: Occasional  . Drug Use: No  . Sexual Activity: Not on file   Other Topics Concern  . Not on file   Social History Narrative  . No narrative on file    ROS: no fevers or chills, productive cough, hemoptysis, dysphasia, odynophagia, melena, hematochezia,  dysuria, hematuria, rash, seizure activity, orthopnea, PND, pedal edema, claudication. Remaining systems are negative.  Physical Exam: Well-developed well-nourished in no acute distress.  Skin is warm and dry.  HEENT is normal.  Neck is supple.  Chest is clear to auscultation with normal expansion.  Cardiovascular exam is regular rate and rhythm. 2/6 diastolic murmur left sternal border Abdominal exam nontender or distended. No masses palpated. Extremities show no edema. neuro grossly intact  ECG Sinus rhythm at a rate of 70. No ST changes. Cannot rule out prior inferior infarct.

## 2013-09-28 NOTE — Assessment & Plan Note (Signed)
Patient is felt to have moderate aortic insufficiency on most recent echocardiogram. He is not having symptoms of dyspnea. His LV function is normal and there is no left ventricular dilatation. Plan followup echocardiogram in October 2015.

## 2013-09-29 ENCOUNTER — Encounter: Payer: Self-pay | Admitting: Radiation Oncology

## 2013-09-29 NOTE — Progress Notes (Addendum)
GU Location of Tumor / Histology: testicular  If Prostate Cancer, Gleason Score is ( + ) and PSA is ()  Patient presented 1 months ago with signs/symptoms of: right scrotal swelling  Biopsies of testis (if applicable) revealed:  1/83/35 Diagnosis Testis, tumor, right - SEMINOMA, 6.3 CM. - ANGIOLYMPHATIC INVASION PRESENT. - RESECTION MARGINS, NEGATIVE FOR MALIGNANCY. - PLEASE SEE ONCOLOGY TEMPLATE FOR DETAILS. ONCOLOGY TABLE - TESTIS 1. Specimen and laterality: Right testis 2. Tumor focality: Unifocal 3. Macroscopic extent of tumor: Limited to testicular parenchyma. 4. Maximum tumor size (cm): 6.3 cm 5. Histologic type: Seminoma 6. Microscopic tumor extension: Limited to testis 7. Spermatic cord and surgical margins: Negative 8. Lymph-Vascular invasion: Present 9. Intratubular germ cell neoplasia: Present 10. Lymph nodes: # examined: N/A; # positive: N/A 11. TNM code: pT2, pNX 12. Serum tumor markers: See patient's medical record 13. Comment: The cut surfaces of the tumor show a 6.3 cm well circumscribed tan soft mass. Microscopically, sections show uniform malignant cells with prominent nucleoli, abundant cytoplasm with mitotic activity and tumor necrosis. Angiolymphatic invasion is present. The tumor is limited to testis. 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 Immunohistochemical stains were performed and the neoplastic cells show the following immunoprofile: CD117 - negative OCT4- positive EMA- negative CK AE1/AE3- negative PLAP - positive CD30 - virtually negative CD20 - negative AFP - negative  Past/Anticipated interventions by urology, if any: 09/11/13 right inguinal orchiectomy, Dr Diona Fanti  Past/Anticipated interventions by medical oncology, if any: none  Weight changes, if any: no  Bowel/Bladder complaints, if any:  Pt states since beginning Lipitor he feels like he has incomplete emptying of bladder, no bowel issues.  Nausea/Vomiting, if  any: no  Pain issues, if any:  no  SAFETY ISSUES:  Prior radiation? no  Pacemaker/ICD? no  Possible current pregnancy? na  Is the patient on methotrexate? 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

## 2013-10-01 ENCOUNTER — Encounter: Payer: Self-pay | Admitting: Radiation Oncology

## 2013-10-01 ENCOUNTER — Ambulatory Visit
Admission: RE | Admit: 2013-10-01 | Discharge: 2013-10-01 | Disposition: A | Discharge status: Home / Self Care | Payer: 59 | Source: Ambulatory Visit | Attending: Radiation Oncology | Admitting: Radiation Oncology

## 2013-10-01 VITALS — BP 138/88 | HR 96 | Temp 97.9°F | Resp 20 | Ht 71.0 in | Wt 209.6 lb

## 2013-10-01 DIAGNOSIS — Z860100 Personal history of colon polyps, unspecified: Secondary | ICD-10-CM | POA: Insufficient documentation

## 2013-10-01 DIAGNOSIS — E785 Hyperlipidemia, unspecified: Secondary | ICD-10-CM | POA: Insufficient documentation

## 2013-10-01 DIAGNOSIS — Z7982 Long term (current) use of aspirin: Secondary | ICD-10-CM | POA: Diagnosis not present

## 2013-10-01 DIAGNOSIS — Z9079 Acquired absence of other genital organ(s): Secondary | ICD-10-CM | POA: Diagnosis not present

## 2013-10-01 DIAGNOSIS — N529 Male erectile dysfunction, unspecified: Secondary | ICD-10-CM | POA: Insufficient documentation

## 2013-10-01 DIAGNOSIS — C6290 Malignant neoplasm of unspecified testis, unspecified whether descended or undescended: Secondary | ICD-10-CM

## 2013-10-01 DIAGNOSIS — Z510 Encounter for antineoplastic radiation therapy: Secondary | ICD-10-CM | POA: Insufficient documentation

## 2013-10-01 HISTORY — DX: Malignant (primary) neoplasm, unspecified: C80.1

## 2013-10-01 HISTORY — DX: Malignant neoplasm of unspecified testis, unspecified whether descended or undescended: C62.90

## 2013-10-01 NOTE — Addendum Note (Signed)
Encounter addended by: Andria Rhein, RN on: 10/01/2013  2:10 PM<BR>     Documentation filed: Charges VN

## 2013-10-01 NOTE — Progress Notes (Addendum)
[facility] NEW PATIENT EVALUATION  Name: [name] Anderson MRN: [id number]  Date:   10/01/2013           DOB: Sep 03, 1955  Status: outpatient   CC: Noralee Space, MD  Dahlstedt, Lillette Boxer, MD Dr. 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 male who is seen today for the courtesy of Dr. Diona Fanti for evaluation of his T2 N0 seminoma of the right testis. He presented with a one to two-month history of an enlarging right testis. He saw Dr. Diona Fanti who noted an enlarged right testis. Scrotal ultrasound showed a 6.4 cm right testis with inhomogeneity. His serum alpha-fetoprotein, LDH, and beta hCG were within normal limits. He underwent a right radical orchiectomy on 09/11/2013. On review of his pathology he was found to have a 6.3 cm pure seminoma with LV I. and tumor necrosis. The tumor was limited to the testis. Her staging workup included CT scans of the chest, abdomen, and pelvis which were without evidence for metastatic disease. There were several small stable bilateral pulmonary nodules. He is without complaints today. No history of previous inguinal, pelvic, or abdominal surgery. No history of undescended testis. He is not interested in having more children.  PREVIOUS RADIATION THERAPY: No   PAST MEDICAL HISTORY:  has a past medical history of Seasonal allergies; Personal history of colonic polyps; Hyperlipidemia; History of basal cell carcinoma excision; Moderate aortic valve insufficiency; Testicular mass; Multiple pulmonary nodules; Mild obstructive sleep apnea; History of kidney stones; ED (erectile dysfunction); Cancer (09/11/13); and Testicular cancer.     PAST SURGICAL HISTORY:  Past Surgical History  Procedure Laterality Date  . Umbilical hernia repair  06-08-2009  . Colonoscopy  01/24/06, 07/10/11  . Mohs surgery   02/2002  &  2013    TIP OF NOSE-///     RIGHT EAR  . Transthoracic echocardiogram  02-25-2013   DR CRENSHAW    NORMAL LVSF/  EF 55-60%/   GRADE 2 DIASTOLIC DYSFUNCTION/  ECCENTRIC MODERATE AORTIC INSUFFICIENCY WITHOUT STENOSIS/  MILD DILATED AORTIC ROOT/ TRIVIAL MR, TR,  & PR  . Extracorporeal shock wave lithotripsy    . Ureteroscopic laser lithotripsy stone extraction  2007  . Scrotal exploration Right 09/11/2013    Procedure: RIGHT INGUINAL EXPLORATION;  Surgeon: Jorja Loa, MD;  Location: [facility];  Service: Urology;  Laterality: Right;  . Orchiectomy Right 09/11/2013    Procedure:  RIGHT ORCHIECTOMY;  Surgeon: Jorja Loa, MD;  Location: [facility];  Service: Urology;  Laterality: Right;     FAMILY HISTORY: family history includes Colon cancer in his father; Heart disease in his paternal grandmother. His father died from colon cancer 18, and his mother is alive and well at 78.   SOCIAL HISTORY:  reports that he has quit smoking. His smoking use included Pipe. He quit smokeless tobacco use about 2 years ago. His smokeless tobacco use included Chew. He reports that he drinks alcohol. He reports that he does not use illicit drugs. Married, 2 sons ages 40 and 60. 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  ALLERGIES: Review of patient's allergies indicates no known allergies.   MEDICATIONS:  Current Outpatient Prescriptions  Medication Sig Dispense Refill  . amoxicillin (AMOXIL) 500 MG capsule TAKE 4 TABLETS BY MOUTH  1 HOUR PRIOR TO DENTAL APPOINTMENT  4 capsule  1  . Ascorbic Acid (VITAMIN C) 1000 MG tablet Take 1,000 mg by mouth daily as needed. DURING WINTER      . aspirin 81 MG tablet Take 81 mg by mouth daily.      Marland Kitchen atorvastatin (LIPITOR) 20 MG tablet Take 20 mg by mouth every morning.      . fish oil-omega-3 fatty acids 1000 MG capsule Take 2 g by mouth daily.      . Multiple Vitamin (MULTIVITAMIN) tablet Take 1 tablet by mouth  daily.      . Multiple Vitamins-Minerals (ECHINACEA ACZ) CAPS Take 2 capsules by mouth daily as needed. DURING WINTER--      . sildenafil (REVATIO) 20 MG tablet Take 20 mg by mouth daily as needed.        No current facility-administered medications for this encounter.     REVIEW OF SYSTEMS:  Pertinent items are noted in HPI.    PHYSICAL EXAM:  height is 5\' 11"  (1.803 m) and weight is 209 lb 9.6 oz (95.074 kg). His oral temperature is 97.9 F (36.6 C). His blood pressure is 138/88 and his pulse is 96. His respiration is 20.   Alert and oriented 58 year old white male appearing his stated age. Nodes: Without palpable cervical, supraclavicular, or inguinal lymphadenopathy. Chest: Lungs clear. Back: Without spinal or CVA discomfort. Abdomen: Without masses organomegaly. Genitalia: Right groin wound which is healing well. Right testis is surgically absent. Left testis without masses or lesions. Extremities: Without edema.    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 I explained the natural history of seminoma with early lymphatic spread to the upper abdomen, and later hematogenous spread. For patients with stage I disease, the reported risk for recurrent disease is 15-18%. Historically,  radiation therapy was used "routinely", but now we are more often recommending surveillance. Risk factors for recurrent disease include tumor size, stage, and presence of LV I. There is some debate as to whether not tumor size is a predictable risk factor for recurrent  disease. The longest surveillance data is from [place] where they reported a 36% chance for local recurrence for tumors greater than 6 cm is in size and only an 18% chance for local recurrence for tumors 3-6 cm in size. Therefore, I would estimate that his risk for recurrent disease is probably between 30-40%. Current management options include surveillance versus 2 weeks of radiation therapy to the abdomen/para-aortic lymph nodes (2000 cGy in 10 sessions), or one to 2 cycles of carboplatin chemotherapy. I discussed the potential acute and late toxicities of radiation therapy. I explained the rationale for surveillance would be to avoid the small risk for development of a radiation-related malignancy (1.5 times normal incidence). However, if he develops a recurrence in we would typically have him undergo salvage chemotherapy which is certainly more toxic. He also understands that he would need  to have serial abdominal/pelvic CT scans more often with surveillance (per the NCCN guidelines). I would like for him to see Dr. Zola Button for discussion of chemotherapy as an option. I told the patient that I would feel comfortable with either of the discussed options (surveillance versus radiation therapy versus chemotherapy).   PLAN: I will set up consultation with Dr. Zola Button, and then the patient will contact me with his decision.  I spent 40 minutes minutes face to face with the patient and more than 50% of that time was spent in counseling and/or coordination of care.

## 2013-10-01 NOTE — Progress Notes (Signed)
Please see the Nurse Progress Note in the MD Initial Consult Encounter for this patient. 

## 2013-10-06 ENCOUNTER — Telehealth: Payer: Self-pay | Admitting: Oncology

## 2013-10-06 NOTE — Telephone Encounter (Signed)
LEFT MESSAGE FOR PATIENT TO RETURN CALL TO SCHEDULE NP APPT.  °

## 2013-10-06 NOTE — Telephone Encounter (Signed)
C/D 10/06/13 for appt. 10/15/13

## 2013-10-06 NOTE — Telephone Encounter (Signed)
S/W PATIENT AND GAVE NP APPT FOR 06/18 @ 1:30 W/DR. SHADAD.  REFERRING DR. Valere Dross DX- SEMINOMA; TESTIS WELCOME PACKET MAILED.

## 2013-10-15 ENCOUNTER — Telehealth: Payer: Self-pay | Admitting: Medical Oncology

## 2013-10-15 ENCOUNTER — Other Ambulatory Visit: Payer: 59

## 2013-10-15 ENCOUNTER — Ambulatory Visit: Payer: 59 | Admitting: Oncology

## 2013-10-15 ENCOUNTER — Ambulatory Visit: Payer: 59

## 2013-10-15 NOTE — Telephone Encounter (Signed)
Courtesy call to pt for tomorrow's appt. Patient confirmed and knows to bring a list of his current medications. All questions answered.

## 2013-10-16 ENCOUNTER — Encounter: Payer: Self-pay | Admitting: Oncology

## 2013-10-16 ENCOUNTER — Ambulatory Visit (HOSPITAL_BASED_OUTPATIENT_CLINIC_OR_DEPARTMENT_OTHER): Payer: 59 | Admitting: Oncology

## 2013-10-16 ENCOUNTER — Other Ambulatory Visit: Payer: 59

## 2013-10-16 ENCOUNTER — Ambulatory Visit: Payer: 59

## 2013-10-16 VITALS — BP 151/81 | HR 71 | Temp 97.5°F | Resp 18 | Ht 71.0 in | Wt 211.0 lb

## 2013-10-16 DIAGNOSIS — C6291 Malignant neoplasm of right testis, unspecified whether descended or undescended: Secondary | ICD-10-CM

## 2013-10-16 DIAGNOSIS — C6290 Malignant neoplasm of unspecified testis, unspecified whether descended or undescended: Secondary | ICD-10-CM

## 2013-10-16 NOTE — Progress Notes (Signed)
Checked in new pt with no financial concerns. °

## 2013-10-16 NOTE — Consult Note (Signed)
Reason for Referral: Testicular cancer.   HPI: 58 year old gentleman currently of [place] but he lived the majority of his life. He has retired from the [employer] and currently has his own business. He is a rather healthy gentleman with history of hyperlipidemia and seasonal allergies as well as nephrolithiasis. He did start noticing a right testicular mass in may of 2015. He was evaluated by Dr. Luberta Robertson from urology and found to have a large right testicular mass suspicious for malignancy. 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 and tumor necrosis. The tumor was limited to the testis. Her staging workup included CT scans of the chest, abdomen, and pelvis which were without evidence for metastatic disease. There were several small stable bilateral pulmonary nodules. He is without complaints today. No history of previous inguinal, pelvic, or abdominal surgery. His tumor markers including alpha-fetoprotein and beta hCG all within normal range. There is a recovered from the operation well without any complications. He was evaluated by Dr. Valere Dross for adjuvant radiation therapy and was referred to me for further discussion regarding adjuvant therapy in general.  Clinically, he is asymptomatic at this point. He have recovered from surgery well without any pain or discomfort. He is not reporting any headaches or blurry vision or double vision. Is not reporting any syncope or seizures. As operative fevers or chills and sweats. Does not report any nausea or vomiting abdominal pain or distention. Does not report any pain cough or wheezing. Does not report any orthopnea, PND palpitations or leg edema. Does not report any frequency urgency or hesitancy. Does not report any hematuria or any history of undescended testes. He does not report any  lymphadenopathy or petechiae. Is not reporting any easy bruisability or clotting tendencies. He does not report any skin rashes or lesions. He continues to be very active and attends activities of daily living without any hindrance or decline. Rest of the review of systems otherwise unremarkable.   Past Medical History  Diagnosis Date  . Seasonal allergies   . Personal history of colonic polyps     TUBULAR ADENOMA--   . Hyperlipidemia   . History of basal cell carcinoma excision     02/2002   --  NOSE  &   2013  RIGHT EAR  . Moderate aortic valve insufficiency   . Testicular mass     RIGHT  . Multiple pulmonary nodules     PER CT--  PROBABLE  GRANULOMATOUS  . Mild obstructive sleep apnea     PER STUDY 01-18-2005--  NO CPAP OR MOUTH GUARD  . History of kidney stones   . ED (erectile dysfunction)   . Cancer 09/11/13    testicular  . Testicular cancer   :  Past Surgical History  Procedure Laterality Date  . Umbilical hernia repair  06-08-2009  . Colonoscopy  01/24/06, 07/10/11  . Mohs surgery  02/2002  &  2013    TIP OF NOSE-///     RIGHT EAR  . Transthoracic echocardiogram  02-25-2013   DR CRENSHAW    NORMAL LVSF/  EF 55-60%/   GRADE 2 DIASTOLIC DYSFUNCTION/  ECCENTRIC MODERATE AORTIC INSUFFICIENCY WITHOUT STENOSIS/  MILD DILATED AORTIC ROOT/ TRIVIAL MR, TR,  & PR  . Extracorporeal shock wave lithotripsy    . Ureteroscopic laser lithotripsy stone extraction  2007  . Scrotal exploration Right  09/11/2013    Procedure: RIGHT INGUINAL EXPLORATION;  Surgeon: Jorja Loa, MD;  Location: [facility];  Service: Urology;  Laterality: Right;  . Orchiectomy Right 09/11/2013    Procedure:  RIGHT ORCHIECTOMY;  Surgeon: Jorja Loa, MD;  Location: [facility];  Service: Urology;  Laterality: Right;  :  Current Outpatient Prescriptions  Medication Sig Dispense Refill  . amoxicillin (AMOXIL) 500 MG capsule TAKE 4 TABLETS BY MOUTH 1 HOUR PRIOR TO  DENTAL APPOINTMENT  4 capsule  1  . Ascorbic Acid (VITAMIN C) 1000 MG tablet Take 1,000 mg by mouth daily as needed. DURING WINTER      . aspirin 81 MG tablet Take 81 mg by mouth daily.      Marland Kitchen atorvastatin (LIPITOR) 20 MG tablet Take 20 mg by mouth every morning.      . fish oil-omega-3 fatty acids 1000 MG capsule Take 2 g by mouth daily.      . Multiple Vitamin (MULTIVITAMIN) tablet Take 1 tablet by mouth daily.      . Multiple Vitamins-Minerals (ECHINACEA ACZ) CAPS Take 2 capsules by mouth daily as needed. DURING WINTER--      . sildenafil (REVATIO) 20 MG tablet Take 20 mg by mouth daily as needed.        No current facility-administered medications for this visit.       No Known Allergies:  Family History  Problem Relation Age of Onset  . Colon cancer Father   . Heart disease Paternal Grandmother   :  History   Social History  . Marital Status: Married    Spouse Name: N/A    Number of Children: 2  . Years of Education: N/A   Occupational History  .  Unemployed   Social History Main Topics  . Smoking status: Former Smoker    Types: Pipe  . Smokeless tobacco: Former Systems developer    Types: [place] date: 04/26/2011     Comment: OCCASIONAL  PIPE SMOKER  ([place])  . Alcohol Use: Yes     Comment: Occasional  . Drug Use: No  . Sexual Activity: Not on file   Other Topics Concern  . Not on file   Social History Narrative  . No narrative on file  :   Exam: ECOG 0 Blood pressure 151/81, pulse 71, temperature 97.5 F (36.4 C), temperature source Oral, resp. rate 18, height 5\' 11"  (1.803 m), weight 211 lb (95.709 kg), SpO2 100.00%. General appearance: alert and cooperative Head: Normocephalic, without obvious abnormality Eyes: conjunctivae/corneas clear. PERRL, EOM's intact. Fundi benign. Throat: lips, mucosa, and tongue normal; teeth and gums normal Neck: no adenopathy Back: symmetric, no curvature. ROM normal. No CVA tenderness. Resp: clear to  auscultation bilaterally Chest wall: no tenderness Cardio: regular rate and rhythm, S1, S2 normal, no murmur, click, rub or gallop GI: soft, non-tender; bowel sounds normal; no masses,  no organomegaly Extremities: extremities normal, atraumatic, no cyanosis or edema Pulses: 2+ and symmetric Skin: Skin color, texture, turgor normal. No rashes or lesions Lymph nodes: Cervical, supraclavicular, and axillary nodes normal.    Assessment and Plan:   58 year old gentleman with the following issues:  1. 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 He presented with a large testicular mass measuring 6.3 cm and status post orchiectomy on 09/11/2013. He has normal tumor markers and no evidence of lymphadenopathy on CT scans. An echo course of this disease was discussed extensively with the patient and  his wife. I concur with Dr. Luberta Robertson and Dr. Charlton Amor assessment of his treatment options.  Given the tumor lymphovascular invasion which is probably the most important prognostic feature in his tumor I think it's reasonable to consider adjuvant therapy. I explained to him that all 3 options including observation, adjuvant radiation therapy, and adjuvant chemotherapy are all reasonable options. The logistics of active surveillance in the setting of a stage IB seminoma with lymphovascular invasion was discussed extensively. I feel that given the fact he has somewhat between 30-35% chance of recurrence he still has a reasonable chance of not needing any treatment. With adjuvant therapy we will be overtreating close to 60% of the time in his case. I've explained to him the importance of adherence to active surveillance as well as the complications as well as the cost associated with frequent CT scans. The logistics of chemotherapy administration was also discussed. Explained to him 1- 2 cycles of carboplatin at an AUC of 7 would be needed to satisfy this requirement. Complications associated without were  discussed including potential and unknown late toxicities. The details of adjuvant radiation therapy were discussed fully by Dr. Valere Dross and he is satisfied with that information.  I explained to him regardless of his decision his cure rate is excellent and approaching on the present even if he chooses active surveillance and this cancer recurs chemotherapy is able to salvage his patient's close to home the present time.  All his questions were answered at this time like to gather more information from his primary care physician and will like to also meet with Dr. Luberta Robertson before making his final decision.  Excellent and be more than happy to oversee his active surveillance is Dr. Luberta Robertson prefers be to do so.  2. Age-appropriate cancer screening: He does have a family history of colon cancer in May of emphasized importance to continue with active screening colonoscopies.

## 2013-10-16 NOTE — Progress Notes (Signed)
Please see note.

## 2013-12-02 ENCOUNTER — Encounter: Payer: Self-pay | Admitting: Internal Medicine

## 2014-01-28 ENCOUNTER — Ambulatory Visit (HOSPITAL_COMMUNITY): Payer: 59 | Attending: Cardiology

## 2014-01-28 ENCOUNTER — Other Ambulatory Visit (HOSPITAL_COMMUNITY): Payer: 59

## 2014-01-28 DIAGNOSIS — N200 Calculus of kidney: Secondary | ICD-10-CM | POA: Insufficient documentation

## 2014-01-28 DIAGNOSIS — I351 Nonrheumatic aortic (valve) insufficiency: Secondary | ICD-10-CM | POA: Insufficient documentation

## 2014-01-28 DIAGNOSIS — E785 Hyperlipidemia, unspecified: Secondary | ICD-10-CM | POA: Insufficient documentation

## 2014-01-28 DIAGNOSIS — Z87891 Personal history of nicotine dependence: Secondary | ICD-10-CM | POA: Insufficient documentation

## 2014-01-28 DIAGNOSIS — I359 Nonrheumatic aortic valve disorder, unspecified: Secondary | ICD-10-CM

## 2014-01-28 DIAGNOSIS — C4491 Basal cell carcinoma of skin, unspecified: Secondary | ICD-10-CM | POA: Diagnosis not present

## 2014-01-28 NOTE — Progress Notes (Signed)
2D Echo completed. 01/28/2014

## 2014-02-15 ENCOUNTER — Ambulatory Visit: Payer: 59 | Admitting: Pulmonary Disease

## 2014-03-15 NOTE — Progress Notes (Signed)
HPI: FU aortic insufficiency. CTA in December of 2014 showed aortic root dilatation of between 4 and 4.3 cm. There were small pulmonary nodules noted and followup recommended in one year. Last echocardiogram in October 2015 showed normal LV function, grade 2 diastolic dysfunction, moderate aortic insufficiency. Since last seen, the patient has dyspnea with more extreme activities but not with routine activities. It is relieved with rest. It is not associated with chest pain. There is no orthopnea, PND or pedal edema. There is no syncope or palpitations. There is no exertional chest pain.   Current Outpatient Prescriptions  Medication Sig Dispense Refill  . amoxicillin (AMOXIL) 500 MG capsule TAKE 4 TABLETS BY MOUTH 1 HOUR PRIOR TO DENTAL APPOINTMENT 4 capsule 1  . Ascorbic Acid (VITAMIN C) 1000 MG tablet Take 1,000 mg by mouth daily as needed. DURING WINTER    . aspirin 81 MG tablet Take 81 mg by mouth daily.    Marland Kitchen atorvastatin (LIPITOR) 20 MG tablet Take 20 mg by mouth every morning.    Marland Kitchen HYDROmorphone (DILAUDID) 2 MG tablet Take 2 mg by mouth every 4 (four) hours as needed. for pain  0  . HYPERCARE 20 % external solution Apply 1 application topically at bedtime as needed.  10  . Multiple Vitamin (MULTIVITAMIN) tablet Take 1 tablet by mouth daily.    . Multiple Vitamins-Minerals (ECHINACEA ACZ) CAPS Take 2 capsules by mouth daily as needed. DURING WINTER--    . sildenafil (REVATIO) 20 MG tablet Take 20 mg by mouth daily as needed.     . tamsulosin (FLOMAX) 0.4 MG CAPS capsule Take 0.4 mg by mouth daily.  2  . triamcinolone cream (KENALOG) 0.1 % Apply 1 application topically daily as needed. Apply to area after bath (DO NOT APPLY TO FACE)  1   No current facility-administered medications for this visit.     Past Medical History  Diagnosis Date  . Seasonal allergies   . Personal history of colonic polyps     TUBULAR ADENOMA--   . Hyperlipidemia   . History of basal cell carcinoma  excision     02/2002   --  NOSE  &   2013  RIGHT EAR  . Moderate aortic valve insufficiency   . Testicular mass     RIGHT  . Multiple pulmonary nodules     PER CT--  PROBABLE  GRANULOMATOUS  . Mild obstructive sleep apnea     PER STUDY 01-18-2005--  NO CPAP OR MOUTH GUARD  . History of kidney stones   . ED (erectile dysfunction)   . Cancer 09/11/13    testicular  . Testicular cancer     Past Surgical History  Procedure Laterality Date  . Umbilical hernia repair  06-08-2009  . Colonoscopy  01/24/06, 07/10/11  . Mohs surgery  02/2002  &  2013    TIP OF NOSE-///     RIGHT EAR  . Transthoracic echocardiogram  02-25-2013   DR CRENSHAW    NORMAL LVSF/  EF 55-60%/   GRADE 2 DIASTOLIC DYSFUNCTION/  ECCENTRIC MODERATE AORTIC INSUFFICIENCY WITHOUT STENOSIS/  MILD DILATED AORTIC ROOT/ TRIVIAL MR, TR,  & PR  . Extracorporeal shock wave lithotripsy    . Ureteroscopic laser lithotripsy stone extraction  2007  . Scrotal exploration Right 09/11/2013    Procedure: RIGHT INGUINAL EXPLORATION;  Surgeon: Jorja Loa, MD;  Location: [facility];  Service: Urology;  Laterality: Right;  . Orchiectomy Right 09/11/2013  Procedure:  RIGHT ORCHIECTOMY;  Surgeon: Jorja Loa, MD;  Location: [facility];  Service: Urology;  Laterality: Right;    History   Social History  . Marital Status: Married    Spouse Name: N/A    Number of Children: 2  . Years of Education: N/A   Occupational History  .  Unemployed   Social History Main Topics  . Smoking status: Former Smoker    Types: Pipe  . Smokeless tobacco: Former Systems developer    Types: [place] date: 04/26/2011     Comment: OCCASIONAL  PIPE SMOKER  ([place])  . Alcohol Use: Yes     Comment: Occasional  . Drug Use: No  . Sexual Activity: Not on file   Other Topics Concern  . Not on file   Social History Narrative    ROS: no fevers or chills, productive cough, hemoptysis, dysphasia,  odynophagia, melena, hematochezia, dysuria, hematuria, rash, seizure activity, orthopnea, PND, pedal edema, claudication. Remaining systems are negative.  Physical Exam: Well-developed well-nourished in no acute distress.  Skin is warm and dry.  HEENT is normal.  Neck is supple.  Chest is clear to auscultation with normal expansion.  Cardiovascular exam is regular rate and rhythm. 2/6 diastolic murmur left sternal border. Abdominal exam nontender or distended. No masses palpated. Positive bruit Extremities show no edema. neuro grossly intact  ECG Sinus rhythm, No ST changes

## 2014-03-19 ENCOUNTER — Ambulatory Visit (INDEPENDENT_AMBULATORY_CARE_PROVIDER_SITE_OTHER): Payer: 59 | Admitting: Cardiology

## 2014-03-19 ENCOUNTER — Encounter: Payer: Self-pay | Admitting: Cardiology

## 2014-03-19 VITALS — BP 110/80 | HR 75 | Ht 71.0 in | Wt 210.0 lb

## 2014-03-19 DIAGNOSIS — I77810 Thoracic aortic ectasia: Secondary | ICD-10-CM

## 2014-03-19 DIAGNOSIS — I359 Nonrheumatic aortic valve disorder, unspecified: Secondary | ICD-10-CM

## 2014-03-19 DIAGNOSIS — R0989 Other specified symptoms and signs involving the circulatory and respiratory systems: Secondary | ICD-10-CM

## 2014-03-19 DIAGNOSIS — R918 Other nonspecific abnormal finding of lung field: Secondary | ICD-10-CM

## 2014-03-19 NOTE — Assessment & Plan Note (Signed)
Plan repeat CTA to evaluate dilated thoracic aorta and also abdominal aorta as he has a soft bruit.

## 2014-03-19 NOTE — Assessment & Plan Note (Signed)
Most recent echocardiogram shows moderate aortic insufficiency. We will most likely repeat in one year when he returns.

## 2014-03-19 NOTE — Patient Instructions (Signed)
Your physician wants you to follow-up in: [name] Anderson will receive a reminder letter in the mail two months in advance. If you don't receive a letter, please call our office to schedule the follow-up appointment.   Non-Cardiac CT Angiography (CTA), is a special type of CT scan that uses a computer to produce multi-dimensional views of major blood vessels throughout the body. In CT angiography, a contrast material is injected through an IV to help visualize the blood vessels CTA OF THE CHEST TO F/U PULMONARY NODULES AND THORACIC ANEURYSM CTA OF THE ABDOMIN FOR BRUIT  Your physician recommends that you HAVE LAB WORK TODAY

## 2014-03-19 NOTE — Assessment & Plan Note (Signed)
Chest CTA will also reassess pulmonary nodules.

## 2014-03-19 NOTE — Assessment & Plan Note (Signed)
Continue statin. 

## 2014-03-24 ENCOUNTER — Other Ambulatory Visit: Payer: Self-pay | Admitting: Dermatology

## 2014-03-24 DIAGNOSIS — C4491 Basal cell carcinoma of skin, unspecified: Secondary | ICD-10-CM

## 2014-03-24 HISTORY — DX: Basal cell carcinoma of skin, unspecified: C44.91

## 2014-04-01 ENCOUNTER — Other Ambulatory Visit: Payer: 59

## 2014-04-06 ENCOUNTER — Inpatient Hospital Stay: Admission: RE | Admit: 2014-04-06 | Payer: 59 | Source: Ambulatory Visit

## 2014-04-06 ENCOUNTER — Ambulatory Visit (INDEPENDENT_AMBULATORY_CARE_PROVIDER_SITE_OTHER)
Admission: RE | Admit: 2014-04-06 | Discharge: 2014-04-06 | Disposition: A | Discharge status: Home / Self Care | Payer: 59 | Source: Ambulatory Visit | Attending: Cardiology | Admitting: Cardiology

## 2014-04-06 DIAGNOSIS — R0989 Other specified symptoms and signs involving the circulatory and respiratory systems: Secondary | ICD-10-CM

## 2014-04-06 DIAGNOSIS — R918 Other nonspecific abnormal finding of lung field: Secondary | ICD-10-CM

## 2014-04-06 DIAGNOSIS — I77810 Thoracic aortic ectasia: Secondary | ICD-10-CM

## 2014-04-06 MED ORDER — IOHEXOL 350 MG/ML SOLN: 100.0000 mL | Freq: Once | INTRAVENOUS | Status: AC | PRN

## 2014-04-07 LAB — BASIC METABOLIC PANEL WITH GFR
BUN: 16 mg/dL (ref 6–23)
CO2: 28 mEq/L (ref 19–32)
Calcium: 8.9 mg/dL (ref 8.4–10.5)
Chloride: 105 mEq/L (ref 96–112)
Creat: 0.84 mg/dL (ref 0.50–1.35)
GFR, Est African American: >89 mL/min
GFR, Est Non African American: >89 mL/min
Glucose, Bld: 76 mg/dL (ref 70–99)
Potassium: 4.4 mEq/L (ref 3.5–5.3)
Sodium: 139 mEq/L (ref 135–145)

## 2014-04-21 ENCOUNTER — Other Ambulatory Visit: Payer: Self-pay | Admitting: Pulmonary Disease

## 2014-04-28 ENCOUNTER — Ambulatory Visit (HOSPITAL_COMMUNITY)
Admission: RE | Admit: 2014-04-28 | Discharge: 2014-04-28 | Disposition: A | Discharge status: Home / Self Care | Payer: 59 | Source: Ambulatory Visit | Attending: Urology | Admitting: Urology

## 2014-04-28 ENCOUNTER — Other Ambulatory Visit: Payer: Self-pay | Admitting: Urology

## 2014-04-28 DIAGNOSIS — C6290 Malignant neoplasm of unspecified testis, unspecified whether descended or undescended: Secondary | ICD-10-CM

## 2014-05-03 ENCOUNTER — Other Ambulatory Visit: Payer: Self-pay | Admitting: Pulmonary Disease

## 2014-07-09 ENCOUNTER — Ambulatory Visit (HOSPITAL_COMMUNITY)
Admission: RE | Admit: 2014-07-09 | Discharge: 2014-07-09 | Disposition: A | Discharge status: Home / Self Care | Payer: 59 | Source: Ambulatory Visit | Attending: Urology | Admitting: Urology

## 2014-07-09 ENCOUNTER — Other Ambulatory Visit: Payer: Self-pay | Admitting: Urology

## 2014-07-09 DIAGNOSIS — C6290 Malignant neoplasm of unspecified testis, unspecified whether descended or undescended: Secondary | ICD-10-CM | POA: Diagnosis present

## 2014-11-05 ENCOUNTER — Other Ambulatory Visit: Payer: Self-pay | Admitting: Urology

## 2014-11-05 ENCOUNTER — Ambulatory Visit (HOSPITAL_COMMUNITY)
Admission: RE | Admit: 2014-11-05 | Discharge: 2014-11-05 | Disposition: A | Discharge status: Home / Self Care | Payer: Commercial Managed Care - HMO | Source: Ambulatory Visit | Attending: Urology | Admitting: Urology

## 2014-11-05 DIAGNOSIS — R918 Other nonspecific abnormal finding of lung field: Secondary | ICD-10-CM | POA: Insufficient documentation

## 2014-11-05 DIAGNOSIS — C6290 Malignant neoplasm of unspecified testis, unspecified whether descended or undescended: Secondary | ICD-10-CM

## 2014-12-24 ENCOUNTER — Telehealth: Payer: Self-pay | Admitting: Family Medicine

## 2014-12-24 NOTE — Telephone Encounter (Signed)
[place]    --------------------------------------------------------------------------------   Patient Name: [name] Anderson  Gender: Male  DOB: 05/26/1955   Age: 59 Y 52 M  Return Phone Number: [phone] (Primary), [phone] (Secondary)  Address:     City/State/Zip:  [place]     Client [facility] - Client  Client Site [place] Type Call  Call Type Triage / Clinical  Caller Name Dameer Speiser  Relationship To Patient Spouse  Return Phone Number [phone] (Secondary)  Chief Complaint Blood In Stool  Initial Comment Caller states she was trying to get an appointment because he has blood in his stool.  PreDisposition Call Doctor       Nurse Assessment  Nurse: Amalia Hailey, RN, Lenna Sciara Date/Time Eilene Ghazi Time): 12/24/2014 4:33:40 PM  Confirm and document reason for call. If symptomatic, describe symptoms. ---Caller states she was trying to get an appointment because he has blood in his stool.    Has the patient traveled out of the country within the last 30 days? ---Not Applicable    Does the patient require triage? ---Yes    Related visit to physician within the last 2 weeks? ---No    Does the PT have any chronic conditions? (i.e. diabetes, asthma, etc.) ---No           Guidelines          Guideline Title Affirmed Question Affirmed Notes Nurse Date/Time Eilene Ghazi Time)  Rectal Bleeding MILD rectal bleeding (more than just a few drops or streaks)    Amalia Hailey, RN, Melissa 12/24/2014 4:34:44 PM    Disp. Time Eilene Ghazi Time) Disposition Final User    12/24/2014 4:29:18 PM Send To RN Personal   Amalia Hailey, RN, Melissa      12/24/2014 4:43:29 PM See PCP When Office is Open (within 3 days) Yes Amalia Hailey, RN, Emelia [place] Understands: Yes  Disagree/Comply: Comply       Care Advice Given  Per Guideline        SEE PCP WITHIN 3 DAYS: * You need to be seen within 2 or 3 days. Call your doctor during regular office hours and make an appointment. An urgent care center is often the best source of care if your doctor's office is closed or you can't get an appointment. NOTE: If office will be open tomorrow, tell caller to call then, not in 3 days. WARM SALINE SITZ BATHS TWICE DAILY FOR RECTAL SYMPTOMS: * Sit in a warm sitz bath for 20 minutes twice a day. This will decrease swelling and irritation, keep the area clean, and help with healing. * Afterwards, pat area dry with unscented toilet paper. TO SOFTEN STOOLS AND TREAT CONSTIPATION: * Eat a high fiber diet. * Drink adequate liquids (6-8 glasses of water a day) HIGH FIBER DIET- A high fiber diet will help improve your intestinal function and soften your BM's. The fiber works by holding more water in your stools: * Try to eat fresh fruit and vegetables at each meal (peas, prunes, citrus, apples, beans, corn). * Eat more grain foods (bran flakes, bran muffins, graham crackers, oatmeal, brown rice, and whole wheat bread). Popcorn is a source of fiber. HYDROCORTISONE OINTMENT FOR RECTAL ITCHING: * After SITZ bath and drying your rectal area, apply 1% hydrocortisone ointment (OTC) 2  times a day to reduce irritation. CALL BACK IF: * You become worse. * Bleeding increases * Dizziness occurs    After Care Instructions Given        Call Event Type User Date / Time Description        --------------------------------------------------------------------------------         Comments  User: Colin Ina, RN Date/Time Eilene Ghazi Time): 12/24/2014 4:28:55 PM  Caller reports she is not with her husband. Asked to give her five minutes before calling him back at the number [phone].      User: Colin Ina, RN Date/Time Eilene Ghazi Time): 12/24/2014 4:56:01 PM Caller declined the appt for Monday and requested an appt for Friday, Sept. 2nd because he  reports "off that day". Caller informed of appt @ 8:15am on Sept. 2nd scheduled. Caller advised to call back anytime if his symptoms should get worse or concerns. Caller verb. understood information given.

## 2014-12-24 NOTE — Telephone Encounter (Signed)
Pt already has appt scheduled with Dr Darnell Level on 12/31/14.

## 2014-12-29 NOTE — Telephone Encounter (Signed)
This encounter was created in error - please disregard.

## 2014-12-29 NOTE — Addendum Note (Signed)
Addended by: Gala Lewandowsky B on: 12/29/2014 10:55 AM   Modules accepted: Orders

## 2014-12-29 NOTE — Addendum Note (Signed)
Addended by: Gala Lewandowsky B on: 12/29/2014 11:15 AM   Modules accepted: Level of Service, SmartSet

## 2014-12-31 ENCOUNTER — Ambulatory Visit: Payer: Self-pay | Admitting: Family Medicine

## 2015-03-07 NOTE — Progress Notes (Signed)
HPI: FU aortic insufficiency. Last echocardiogram in October 2015 showed normal LV function, grade 2 diastolic dysfunction, moderate aortic insufficiency. CTA December 2015 showed ascending aorta of 4.7 cm sinuses of Valsalva. No abdominal aortic aneurysm. Since last seen, the patient denies any dyspnea on exertion, orthopnea, PND, pedal edema, palpitations, syncope or chest pain.   Current Outpatient Prescriptions  Medication Sig Dispense Refill  . amoxicillin (AMOXIL) 500 MG capsule TAKE 4 TABLETS BY MOUTH 1 HOUR PRIOR TO DENTAL APPOINTMENT 4 capsule 1  . Ascorbic Acid (VITAMIN C) 1000 MG tablet Take 1,000 mg by mouth daily as needed. DURING WINTER    . aspirin 81 MG tablet Take 81 mg by mouth daily.    Marland Kitchen atorvastatin (LIPITOR) 20 MG tablet Take 20 mg by mouth every morning.    Marland Kitchen HYPERCARE 20 % external solution Apply 1 application topically at bedtime as needed.  10  . Multiple Vitamin (MULTIVITAMIN) tablet Take 1 tablet by mouth daily.    . Multiple Vitamins-Minerals (ECHINACEA ACZ) CAPS Take 2 capsules by mouth daily as needed. DURING WINTER--    . sildenafil (REVATIO) 20 MG tablet Take 20 mg by mouth daily as needed.     . triamcinolone cream (KENALOG) 0.1 % Apply 1 application topically daily as needed. Apply to area after bath (DO NOT APPLY TO FACE)  1   No current facility-administered medications for this visit.     Past Medical History  Diagnosis Date  . Seasonal allergies   . Personal history of colonic polyps     TUBULAR ADENOMA--   . Hyperlipidemia   . History of basal cell carcinoma excision     02/2002   --  NOSE  &   2013  RIGHT EAR  . Moderate aortic valve insufficiency   . Testicular mass     RIGHT  . Multiple pulmonary nodules     PER CT--  PROBABLE  GRANULOMATOUS  . Mild obstructive sleep apnea     PER STUDY 01-18-2005--  NO CPAP OR MOUTH GUARD  . History of kidney stones   . ED (erectile dysfunction)   . Cancer ([place]) 09/11/13    testicular  .  Testicular cancer [facility])     Past Surgical History  Procedure Laterality Date  . Umbilical hernia repair  06-08-2009  . Colonoscopy  01/24/06, 07/10/11  . Mohs surgery  02/2002  &  2013    TIP OF NOSE-///     RIGHT EAR  . Transthoracic echocardiogram  02-25-2013   DR Xion Debruyne    NORMAL LVSF/  EF 55-60%/   GRADE 2 DIASTOLIC DYSFUNCTION/  ECCENTRIC MODERATE AORTIC INSUFFICIENCY WITHOUT STENOSIS/  MILD DILATED AORTIC ROOT/ TRIVIAL MR, TR,  & PR  . Extracorporeal shock wave lithotripsy    . Ureteroscopic laser lithotripsy stone extraction  2007  . Scrotal exploration Right 09/11/2013    Procedure: RIGHT INGUINAL EXPLORATION;  Surgeon: Jorja Loa, MD;  Location: [facility];  Service: Urology;  Laterality: Right;  . Orchiectomy Right 09/11/2013    Procedure:  RIGHT ORCHIECTOMY;  Surgeon: Jorja Loa, MD;  Location: [facility];  Service: Urology;  Laterality: Right;    Social History   Social History  . Marital Status: Married    Spouse Name: N/A  . Number of Children: 2  . Years of Education: N/A   Occupational History  .  Unemployed   Social History Main Topics  . Smoking status: Former Smoker  Types: Pipe  . Smokeless tobacco: Former Systems developer    Types: [place] date: 04/26/2011     Comment: OCCASIONAL  PIPE SMOKER  ([place])  . Alcohol Use: Yes     Comment: Occasional  . Drug Use: No  . Sexual Activity: Not on file   Other Topics Concern  . Not on file   Social History Narrative    ROS: no fevers or chills, productive cough, hemoptysis, dysphasia, odynophagia, melena, hematochezia, dysuria, hematuria, rash, seizure activity, orthopnea, PND, pedal edema, claudication. Remaining systems are negative.  Physical Exam: Well-developed well-nourished in no acute distress.  Skin is warm and dry.  HEENT is normal.  Neck is supple.  Chest is clear to auscultation with normal expansion.  Cardiovascular exam is  regular rate and rhythm. 1/6 systolic and 2/6 diastolic murmur. Abdominal exam nontender or distended. No masses palpated. Extremities show no edema. neuro grossly intact  ECG Sinus rhythm at a rate of 95. Cannot rule out prior inferior infarct.

## 2015-03-11 ENCOUNTER — Other Ambulatory Visit: Payer: Self-pay | Admitting: Urology

## 2015-03-11 ENCOUNTER — Ambulatory Visit (HOSPITAL_COMMUNITY)
Admission: RE | Admit: 2015-03-11 | Discharge: 2015-03-11 | Disposition: A | Discharge status: Home / Self Care | Payer: Commercial Managed Care - HMO | Source: Ambulatory Visit | Attending: Urology | Admitting: Urology

## 2015-03-11 DIAGNOSIS — C6290 Malignant neoplasm of unspecified testis, unspecified whether descended or undescended: Secondary | ICD-10-CM | POA: Diagnosis present

## 2015-03-14 ENCOUNTER — Encounter: Payer: Self-pay | Admitting: Cardiology

## 2015-03-14 ENCOUNTER — Ambulatory Visit (INDEPENDENT_AMBULATORY_CARE_PROVIDER_SITE_OTHER): Payer: Commercial Managed Care - HMO | Admitting: Cardiology

## 2015-03-14 VITALS — BP 124/62 | HR 95 | Ht 71.0 in | Wt 216.0 lb

## 2015-03-14 DIAGNOSIS — E7800 Pure hypercholesterolemia, unspecified: Secondary | ICD-10-CM

## 2015-03-14 DIAGNOSIS — I712 Thoracic aortic aneurysm, without rupture, unspecified: Secondary | ICD-10-CM

## 2015-03-14 DIAGNOSIS — I359 Nonrheumatic aortic valve disorder, unspecified: Secondary | ICD-10-CM | POA: Diagnosis not present

## 2015-03-14 LAB — BASIC METABOLIC PANEL
BUN: 18 mg/dL (ref 7–25)
CO2: 25 mmol/L (ref 20–31)
Calcium: 8.9 mg/dL (ref 8.6–10.3)
Chloride: 105 mmol/L (ref 98–110)
Creat: 0.87 mg/dL (ref 0.70–1.33)
Glucose, Bld: 104 mg/dL — ABNORMAL HIGH (ref 65–99)
Potassium: 3.8 mmol/L (ref 3.5–5.3)
Sodium: 140 mmol/L (ref 135–146)

## 2015-03-14 NOTE — Assessment & Plan Note (Signed)
Continue statin. 

## 2015-03-14 NOTE — Patient Instructions (Signed)
Medication Instructions:   NO CHANGE  Labwork:  Your physician recommends that you HAVE LAB WORK TODAY  Testing/Procedures:  Your physician has requested that you have an echocardiogram. Echocardiography is a painless test that uses sound waves to create images of your heart. It provides your doctor with information about the size and shape of your heart and how well your heart's chambers and valves are working. This procedure takes approximately one hour. There are no restrictions for this procedure.   CTA OF CHEST W/WO TO FOLLOW UP THORACIC ANEURYSM  Follow-Up:  Your physician wants you to follow-up in: [place] will receive a reminder letter in the mail two months in advance. If you don't receive a letter, please call our office to schedule the follow-up appointment.   If you need a refill on your cardiac medications before your next appointment, please call your pharmacy.

## 2015-03-14 NOTE — Assessment & Plan Note (Signed)
Plan follow-up echocardiogram for aortic insufficiency.

## 2015-03-14 NOTE — Assessment & Plan Note (Signed)
Repeat CTA for thoracic aortic aneurysm.

## 2015-03-21 ENCOUNTER — Ambulatory Visit (HOSPITAL_COMMUNITY): Payer: Commercial Managed Care - HMO | Attending: Cardiology

## 2015-03-21 ENCOUNTER — Other Ambulatory Visit: Payer: Self-pay

## 2015-03-21 DIAGNOSIS — I371 Nonrheumatic pulmonary valve insufficiency: Secondary | ICD-10-CM | POA: Insufficient documentation

## 2015-03-21 DIAGNOSIS — I359 Nonrheumatic aortic valve disorder, unspecified: Secondary | ICD-10-CM | POA: Insufficient documentation

## 2015-03-21 DIAGNOSIS — I351 Nonrheumatic aortic (valve) insufficiency: Secondary | ICD-10-CM | POA: Diagnosis not present

## 2015-03-21 DIAGNOSIS — I340 Nonrheumatic mitral (valve) insufficiency: Secondary | ICD-10-CM | POA: Diagnosis not present

## 2015-03-22 ENCOUNTER — Ambulatory Visit (INDEPENDENT_AMBULATORY_CARE_PROVIDER_SITE_OTHER)
Admission: RE | Admit: 2015-03-22 | Discharge: 2015-03-22 | Disposition: A | Discharge status: Home / Self Care | Payer: Commercial Managed Care - HMO | Source: Ambulatory Visit | Attending: Cardiology | Admitting: Cardiology

## 2015-03-22 DIAGNOSIS — I712 Thoracic aortic aneurysm, without rupture, unspecified: Secondary | ICD-10-CM

## 2015-03-22 MED ORDER — IOHEXOL 350 MG/ML SOLN
100.0000 mL | Freq: Once | INTRAVENOUS | Status: AC | PRN
  Administered 2015-03-22: 100 mL via INTRAVENOUS

## 2015-09-09 ENCOUNTER — Ambulatory Visit (HOSPITAL_COMMUNITY)
Admission: RE | Admit: 2015-09-09 | Discharge: 2015-09-09 | Disposition: A | Discharge status: Home / Self Care | Payer: Commercial Managed Care - HMO | Source: Ambulatory Visit | Attending: Urology | Admitting: Urology

## 2015-09-09 ENCOUNTER — Other Ambulatory Visit: Payer: Self-pay | Admitting: Urology

## 2015-09-09 DIAGNOSIS — C6290 Malignant neoplasm of unspecified testis, unspecified whether descended or undescended: Secondary | ICD-10-CM | POA: Insufficient documentation

## 2016-01-10 ENCOUNTER — Ambulatory Visit (HOSPITAL_COMMUNITY)
Admission: RE | Admit: 2016-01-10 | Discharge: 2016-01-10 | Disposition: A | Discharge status: Home / Self Care | Payer: Commercial Managed Care - HMO | Source: Ambulatory Visit | Attending: Family Medicine | Admitting: Family Medicine

## 2016-01-10 ENCOUNTER — Other Ambulatory Visit (HOSPITAL_COMMUNITY): Payer: Self-pay | Admitting: Family Medicine

## 2016-01-10 DIAGNOSIS — R519 Headache, unspecified: Secondary | ICD-10-CM

## 2016-01-10 DIAGNOSIS — R51 Headache: Principal | ICD-10-CM

## 2016-01-18 ENCOUNTER — Encounter: Payer: Self-pay | Admitting: Neurology

## 2016-01-18 ENCOUNTER — Ambulatory Visit (INDEPENDENT_AMBULATORY_CARE_PROVIDER_SITE_OTHER): Payer: Commercial Managed Care - HMO | Admitting: Neurology

## 2016-01-18 VITALS — BP 145/79 | HR 90 | Ht 71.0 in | Wt 205.0 lb

## 2016-01-18 DIAGNOSIS — M542 Cervicalgia: Secondary | ICD-10-CM | POA: Diagnosis not present

## 2016-01-18 DIAGNOSIS — R519 Headache, unspecified: Secondary | ICD-10-CM

## 2016-01-18 DIAGNOSIS — G935 Compression of brain: Secondary | ICD-10-CM

## 2016-01-18 DIAGNOSIS — H539 Unspecified visual disturbance: Secondary | ICD-10-CM | POA: Diagnosis not present

## 2016-01-18 DIAGNOSIS — R51 Headache: Secondary | ICD-10-CM

## 2016-01-18 NOTE — Progress Notes (Signed)
[facility]    Provider:  Dr Jaynee Eagles Referring Provider: Antony Contras, MD Primary Care Physician:  Gara Kroner, MD  CC:  Cerebellar ectopia  HPI:  [name] Anderson is a 60 y.o. male here as a referral from Dr. Moreen Fowler for cerebellar ectopia. Past medical history of hyperlipidemia, erectile dysfunction, kidney stones, testicular cancer, diastolic dysfunction, aortic regurgitation, cerebellar tonsillar ectopia. He is feeling a lot better, the prednisone helped. He had severe headache that started about a week ago, no inciting events or trauma or previous illnesses. It just came on, maybe stress. Grandmother and son had migraines. Headache was on the right side and felt like a screwdriver on the side of the head, severe, throbbing, pounding, some light sensitivity, nausea. He did not have vision changes. No dizziness. Lasted 4 days. Resolved after a steroid taper and flexeril. No history of headaches. He has some chronic pain in the neck, with the headache had numbness on the right side of the face, and vision changes. Denies any significant problems with balance, Poor hand coordination, Numbness and tingling of the hands and feet Dizziness Difficulty swallowing, gagging, choking and vomiting.    Reviewed notes, labs and imaging from outside physicians, which showed:   Reviewed notes her medical physician. Patient presented with chief complaint of severe headache. Symptoms were abrupt on Friday evening 01/06/2016. Symptoms worsened over the weekend with associated orbital pain. Pain is constant, sharp and time and throbbing others. He had a number of episodes of nausea and vomiting associated, 2 days ago. He has developed light sensitivity, which is described as mild. His headaches over the weekend with the worst of his life at which time he took some leftover Dilaudid-HP. His current level of discomfort is 5 on a scale chain. He tried Excedrin Migraine so far. He does not have a history  of migraine headaches. He denies any recent upper respiratory symptoms. No recent head traumas. No visual changes or focal weakness. He was started on prednisone 50 taper and Flexeril.  CBC 01/10/2016 normal, TSH 02/18/2015 2.78, CMP was normal with creatinine 0.93  Personally reviewed CT of the head images 01/10/2016 and agree with the following:  FINDINGS: Brain: There is no evidence of acute cortical infarct, intracranial hemorrhage, mass, midline shift, or extra-axial fluid collection. The ventricles and sulci are normal. Cerebellar tonsillar ectopia is incompletely visualized.  Vascular: No hyperdense vessel or unexpected calcification.  Skull: No fracture or focal osseous lesion.  Sinuses/Orbits: Unremarkable orbits. Left greater than right sphenoid sinus mucosal thickening. Posterior left ethmoid air cell opacification. Visualized mastoid air cells are clear.  Other: None.  IMPRESSION: 1. No evidence of acute intracranial abnormality. 2. Partially visualized, though most likely mild, cerebellar tonsillar ectopia.     Review of Systems: Patient complains of symptoms per HPI as well as the following symptoms: no CP, no SOB. Pertinent negatives per HPI. All others negative.   Social History   Social History  . Marital status: Married    Spouse name: Coralyn Mark  . Number of children: 2  . Years of education: 12   Occupational History  . [employer]- retired    Social History Main Topics  . Smoking status: Former Smoker    Types: Pipe  . Smokeless tobacco: Former Systems developer    Types: [place] date: 04/26/2011     Comment: OCCASIONAL  PIPE SMOKER  ([place])  . Alcohol use Yes     Comment: Occasional  . Drug use:  No  . Sexual activity: Not on file   Other Topics Concern  . Not on file   Social History Narrative   Lives with wife Coralyn Mark   Caffeine use: coffee- 1 cup per day    Family History  Problem Relation Age of Onset  . Colon cancer  Father   . Heart disease Paternal Grandmother     Past Medical History:  Diagnosis Date  . Cancer ([place]) 09/11/13   testicular  . ED (erectile dysfunction)   . High cholesterol   . History of basal cell carcinoma excision    02/2002   --  NOSE  &   2013  RIGHT EAR  . History of kidney stones   . Hyperlipidemia   . Mild obstructive sleep apnea    PER STUDY 01-18-2005--  NO CPAP OR MOUTH GUARD  . Moderate aortic valve insufficiency   . 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 Personal history of colonic polyps    TUBULAR ADENOMA--   . Seasonal allergies   . 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[place])   . Testicular mass    RIGHT    Past Surgical History:  Procedure Laterality Date  . COLONOSCOPY  01/24/06, 07/10/11  . EXTRACORPOREAL SHOCK WAVE LITHOTRIPSY    . MOHS SURGERY  02/2002  &  2013   TIP OF NOSE-///     RIGHT EAR  . ORCHIECTOMY Right 09/11/2013   Procedure:  RIGHT ORCHIECTOMY;  Surgeon: Jorja Loa, MD;  Location: [facility];  Service: Urology;  Laterality: Right;  . SCROTAL EXPLORATION Right 09/11/2013   Procedure: RIGHT INGUINAL EXPLORATION;  Surgeon: Jorja Loa, MD;  [facility];  Service: Urology;  Laterality: Right;  . TRANSTHORACIC ECHOCARDIOGRAM  02-25-2013   DR CRENSHAW   NORMAL LVSF/  EF 55-60%/   GRADE 2 DIASTOLIC DYSFUNCTION/  ECCENTRIC MODERATE AORTIC INSUFFICIENCY WITHOUT STENOSIS/  MILD DILATED AORTIC ROOT/ TRIVIAL MR, TR,  & PR  . UMBILICAL HERNIA REPAIR  06-08-2009  . URETEROSCOPIC LASER LITHOTRIPSY STONE EXTRACTION  2007    Current Outpatient Prescriptions  Medication Sig Dispense Refill  . aspirin 81 MG tablet Take 81 mg by mouth daily.    Marland Kitchen atorvastatin (LIPITOR) 20 MG tablet Take 20 mg by mouth every morning.    Marland Kitchen HYPERCARE 20 % external solution Apply 1 application topically at bedtime as needed.  10  . Multiple Vitamin (MULTIVITAMIN) tablet Take 1 tablet by mouth daily.    .  Multiple Vitamins-Minerals (ECHINACEA ACZ) CAPS Take 2 capsules by mouth daily as needed. DURING WINTER--    . sildenafil (REVATIO) 20 MG tablet Take 20 mg by mouth daily as needed.     . triamcinolone cream (KENALOG) 0.1 % Apply 1 application topically daily as needed. Apply to area after bath (DO NOT APPLY TO FACE)  1  . amoxicillin (AMOXIL) 500 MG capsule TAKE 4 TABLETS BY MOUTH 1 HOUR PRIOR TO DENTAL APPOINTMENT (Patient not taking: Reported on 01/18/2016) 4 capsule 1  . Ascorbic Acid (VITAMIN C) 1000 MG tablet Take 1,000 mg by mouth daily as needed. DURING WINTER     No current facility-administered medications for this visit.     Allergies as of 01/18/2016  . (No Known Allergies)    Vitals: BP (!) 145/79 (BP Location: Right Arm, Patient Position: Sitting, Cuff Size: Normal)   Pulse 90   Ht 5\' 11"  (1.803 m)   Wt 205 lb (93 kg)  BMI 28.59 kg/m  Last Weight:  Wt Readings from Last 1 Encounters:  01/18/16 205 lb (93 kg)   Last Height:   Ht Readings from Last 1 Encounters:  01/18/16 5\' 11"  (1.803 m)    Physical exam: Exam: Gen: NAD, conversant, well nourised, obese, well groomed                     CV: RRR, no MRG. No Carotid Bruits. No peripheral edema, warm, nontender Eyes: Conjunctivae clear without exudates or hemorrhage  Neuro: Detailed Neurologic Exam  Speech:    Speech is normal; fluent and spontaneous with normal comprehension.  Cognition:    The patient is oriented to person, place, and time;     recent and remote memory intact;     language fluent;     normal attention, concentration,     fund of knowledge Cranial Nerves:    The pupils are equal, round, and reactive to light. The fundi are normal and spontaneous venous pulsations are present. Visual fields are full to finger confrontation. Extraocular movements are intact. Trigeminal sensation is intact and the muscles of mastication are normal. The face is symmetric. The palate elevates in the midline.  Hearing intact. Voice is normal. Shoulder shrug is normal. The tongue has normal motion without fasciculations.   Coordination:    Normal finger to nose and heel to shin. Normal rapid alternating movements.   Gait:    Heel-toe and tandem gait are normal.   Motor Observation:    No asymmetry, no atrophy, and no involuntary movements noted. Tone:    Normal muscle tone.    Posture:    Posture is normal. normal erect    Strength:    Strength is V/V in the upper and lower limbs.      Sensation: intact to LT     Reflex Exam:  DTR's:    Deep tendon reflexes in the upper and lower extremities are normal bilaterally.   Toes:    The toes are downgoing bilaterally.   Clonus:    Clonus is absent.      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 Need an MRI of the brain.   Discussed; To prevent or relieve headaches, try the following: Cool Compress. Lie down and place a cool compress on your head.  Avoid headache triggers. If certain foods or odors seem to have triggered your migraines in the past, avoid them. A headache diary might help you identify triggers.  Include physical activity in your daily routine. Try a daily walk or other moderate aerobic exercise.  Manage stress. Find healthy ways to cope with the stressors, such as delegating tasks on your to-do list.  Practice relaxation techniques. Try deep breathing, yoga, massage and visualization.  Eat regularly. Eating regularly scheduled meals and maintaining a healthy diet might help prevent headaches. Also, drink plenty of fluids.  Follow a regular sleep schedule. Sleep deprivation might contribute to headaches Consider biofeedback. With this mind-body technique, you learn to control certain bodily functions - such as muscle tension, heart rate and blood pressure - to prevent headaches or reduce headache pain.    Proceed to emergency room if  you experience new or worsening symptoms or symptoms do not resolve, if you have new neurologic symptoms or if headache is severe, or for any concerning symptom.   Cc: Gara Kroner, MD  Sarina Ill, MD  [facility] [address] [place], [place] [address]  Phone [phone] Fax [phone]

## 2016-01-18 NOTE — Patient Instructions (Signed)
Remember to drink plenty of fluid, eat healthy meals and do not skip any meals. Try to eat protein with a every meal and eat a healthy snack such as fruit or nuts in between meals. Try to keep a regular sleep-wake schedule and try to exercise daily, particularly in the form of walking, 20-30 minutes a day, if you can.   As far as diagnostic testing: MRI brain  I would like to see you back as needed, sooner if we need to. Please call us with any interim questions, concerns, problems, updates or refill requests.   Our phone number is [phone]. We also have an after hours call service for urgent matters and there is a physician on-call for urgent questions. For any emergencies you know to call 911 or go to the nearest emergency room   

## 2016-03-12 NOTE — Progress Notes (Signed)
HPI: FU aortic insufficiency. Echocardiogram November 2016 showed normal LV function, grade 2 diastolic dysfunction, moderate aortic insufficiency. CTA November 2016 showed dilated aortic root at 4.8 cm. Since last seen, the patient has dyspnea with more extreme activities but not with routine activities. It is relieved with rest. It is not associated with chest pain. There is no orthopnea, PND or pedal edema. There is no syncope or palpitations. There is no exertional chest pain.    Current Outpatient Prescriptions  Medication Sig Dispense Refill  . amoxicillin (AMOXIL) 500 MG capsule TAKE 4 TABLETS BY MOUTH 1 HOUR PRIOR TO DENTAL APPOINTMENT 4 capsule 1  . Ascorbic Acid (VITAMIN C) 1000 MG tablet Take 1,000 mg by mouth daily as needed. DURING WINTER    . aspirin 81 MG tablet Take 81 mg by mouth daily.    Marland Kitchen atorvastatin (LIPITOR) 20 MG tablet Take 20 mg by mouth every morning.    Marland Kitchen HYPERCARE 20 % external solution Apply 1 application topically at bedtime as needed.  10  . Multiple Vitamin (MULTIVITAMIN) tablet Take 1 tablet by mouth daily.    . Multiple Vitamins-Minerals (ECHINACEA ACZ) CAPS Take 2 capsules by mouth daily as needed. DURING WINTER--    . sildenafil (REVATIO) 20 MG tablet Take 20 mg by mouth daily as needed.     . triamcinolone cream (KENALOG) 0.1 % Apply 1 application topically daily as needed. Apply to area after bath (DO NOT APPLY TO FACE)  1   No current facility-administered medications for this visit.      Past Medical History:  Diagnosis Date  . Cancer ([place]) 09/11/13   testicular  . ED (erectile dysfunction)   . High cholesterol   . History of basal cell carcinoma excision    02/2002   --  NOSE  &   2013  RIGHT EAR  . History of kidney stones   . Hyperlipidemia   . Mild obstructive sleep apnea    PER STUDY 01-18-2005--  NO CPAP OR MOUTH GUARD  . Moderate aortic valve insufficiency   . 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  Personal history of colonic polyps    TUBULAR ADENOMA--   . Seasonal allergies   . 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[place])   . Testicular mass    RIGHT    Past Surgical History:  Procedure Laterality Date  . COLONOSCOPY  01/24/06, 07/10/11  . EXTRACORPOREAL SHOCK WAVE LITHOTRIPSY    . MOHS SURGERY  02/2002  &  2013   TIP OF NOSE-///     RIGHT EAR  . ORCHIECTOMY Right 09/11/2013   Procedure:  RIGHT ORCHIECTOMY;  Surgeon: Jorja Loa, MD;  [facility];  Service: Urology;  Laterality: Right;  . SCROTAL EXPLORATION Right 09/11/2013   Procedure: RIGHT INGUINAL EXPLORATION;  Surgeon: Jorja Loa, MD;  Location: [facility];  Service: Urology;  Laterality: Right;  . TRANSTHORACIC ECHOCARDIOGRAM  02-25-2013   DR [place]   NORMAL LVSF/  EF 55-60%/   GRADE 2 DIASTOLIC DYSFUNCTION/  ECCENTRIC MODERATE AORTIC INSUFFICIENCY WITHOUT STENOSIS/  MILD DILATED AORTIC ROOT/ TRIVIAL MR, TR,  & PR  . UMBILICAL HERNIA REPAIR  06-08-2009  . URETEROSCOPIC LASER LITHOTRIPSY STONE EXTRACTION  2007    Social History   Social History  . Marital status: Married    Spouse name: Coralyn Mark  . Number of children: 2  . Years of education: 77   Occupational  History  . [employer]- retired    Social History Main Topics  . Smoking status: Former Smoker    Types: Pipe  . Smokeless tobacco: Former Systems developer    Types: [place] date: 04/26/2011     Comment: OCCASIONAL  PIPE SMOKER  ([place])  . Alcohol use Yes     Comment: Occasional  . Drug use: No  . Sexual activity: Not on file   Other Topics Concern  . Not on file   Social History Narrative   Lives with wife Coralyn Mark   Caffeine use: coffee- 1 cup per day    Family History  Problem Relation Age of Onset  . Colon cancer Father   . Heart disease Paternal Grandmother     ROS: no fevers or chills, productive cough, hemoptysis, dysphasia, odynophagia, melena, hematochezia, dysuria, hematuria, rash, seizure  activity, orthopnea, PND, pedal edema, claudication. Remaining systems are negative.  Physical Exam: Well-developed well-nourished in no acute distress.  Skin is warm and dry.  HEENT is normal.  Neck is supple.  Chest is clear to auscultation with normal expansion.  Cardiovascular exam is regular rate and rhythm. 3/6 diastolic murmur left sternal border.  Abdominal exam nontender or distended. No masses palpated. Extremities show no edema. neuro grossly intact  ECG-Sinus rhythm at a rate of 79. Left ventricular hypertrophy.  A/P  1 aortic insufficiency-schedule follow-up echocardiogram.  2 thoracic aortic aneurysm-schedule follow-up CTA.  3 hyperlipidemia-continue statin.  4 Hypertension-patient's blood pressure is elevated. Given his AI and thoracic aortic aneurysm this needs to him the improved. Add Cozaar 50 mg daily. Follow blood pressure and increase as needed. Check potassium and renal function in 1 week.  Kirk Ruths, MD

## 2016-03-16 ENCOUNTER — Other Ambulatory Visit (HOSPITAL_COMMUNITY): Payer: Self-pay | Admitting: Family Medicine

## 2016-03-16 ENCOUNTER — Ambulatory Visit (HOSPITAL_COMMUNITY)
Admission: RE | Admit: 2016-03-16 | Discharge: 2016-03-16 | Disposition: A | Discharge status: Home / Self Care | Payer: Commercial Managed Care - HMO | Source: Ambulatory Visit | Attending: Family Medicine | Admitting: Family Medicine

## 2016-03-16 DIAGNOSIS — C6290 Malignant neoplasm of unspecified testis, unspecified whether descended or undescended: Secondary | ICD-10-CM | POA: Diagnosis not present

## 2016-03-19 ENCOUNTER — Ambulatory Visit (INDEPENDENT_AMBULATORY_CARE_PROVIDER_SITE_OTHER): Payer: Commercial Managed Care - HMO | Admitting: Cardiology

## 2016-03-19 ENCOUNTER — Encounter: Payer: Self-pay | Admitting: Cardiology

## 2016-03-19 VITALS — BP 152/72 | HR 79 | Ht 71.0 in | Wt 215.2 lb

## 2016-03-19 DIAGNOSIS — I359 Nonrheumatic aortic valve disorder, unspecified: Secondary | ICD-10-CM

## 2016-03-19 DIAGNOSIS — I10 Essential (primary) hypertension: Secondary | ICD-10-CM

## 2016-03-19 DIAGNOSIS — I712 Thoracic aortic aneurysm, without rupture, unspecified: Secondary | ICD-10-CM

## 2016-03-19 MED ORDER — LOSARTAN POTASSIUM 50 MG PO TABS: 50.0000 mg | ORAL_TABLET | Freq: Every day | ORAL | 3 refills | Status: DC

## 2016-03-19 NOTE — Patient Instructions (Signed)
Medication Instructions:   START LOSARTAN 50 MG ONCE DAILY  Labwork:  Your physician recommends that you return for lab work in: [place]  Testing/Procedures:  Your physician has requested that you have an echocardiogram. Echocardiography is a painless test that uses sound waves to create images of your heart. It provides your doctor with information about the size and shape of your heart and how well your heart's chambers and valves are working. This procedure takes approximately one hour. There are no restrictions for this procedure.   Non-Cardiac CT Angiography (CTA), is a special type of CT scan that uses a computer to produce multi-dimensional views of major blood vessels throughout the body. In CT angiography, a contrast material is injected through an IV to help visualize the blood vessels CTA OF THE CHEST W/WO TO F/U THORACIC ANEURYSM  Follow-Up:  Your physician wants you to follow-up in: March [facility] will receive a reminder letter in the mail two months in advance. If you don't receive a letter, please call our office to schedule the follow-up appointment.   If you need a refill on your cardiac medications before your next appointment, please call your pharmacy.

## 2016-03-27 ENCOUNTER — Other Ambulatory Visit: Payer: Commercial Managed Care - HMO

## 2016-03-27 LAB — BASIC METABOLIC PANEL
BUN: 15 mg/dL (ref 7–25)
CO2: 24 mmol/L (ref 20–31)
Calcium: 9.2 mg/dL (ref 8.6–10.3)
Chloride: 103 mmol/L (ref 98–110)
Creat: 0.82 mg/dL (ref 0.70–1.25)
Glucose, Bld: 101 mg/dL — ABNORMAL HIGH (ref 65–99)
Potassium: 4.7 mmol/L (ref 3.5–5.3)
Sodium: 135 mmol/L (ref 135–146)

## 2016-03-29 ENCOUNTER — Ambulatory Visit (INDEPENDENT_AMBULATORY_CARE_PROVIDER_SITE_OTHER)
Admission: RE | Admit: 2016-03-29 | Discharge: 2016-03-29 | Disposition: A | Discharge status: Home / Self Care | Payer: Commercial Managed Care - HMO | Source: Ambulatory Visit | Attending: Cardiology | Admitting: Cardiology

## 2016-03-29 DIAGNOSIS — I712 Thoracic aortic aneurysm, without rupture, unspecified: Secondary | ICD-10-CM

## 2016-03-29 MED ORDER — IOPAMIDOL (ISOVUE-370) INJECTION 76%
100.0000 mL | Freq: Once | INTRAVENOUS | Status: AC | PRN
  Administered 2016-03-29: 100 mL via INTRAVENOUS

## 2016-04-13 ENCOUNTER — Ambulatory Visit (HOSPITAL_COMMUNITY): Payer: Commercial Managed Care - HMO | Attending: Cardiology

## 2016-04-13 DIAGNOSIS — I359 Nonrheumatic aortic valve disorder, unspecified: Secondary | ICD-10-CM

## 2016-05-01 ENCOUNTER — Emergency Department (HOSPITAL_COMMUNITY)
Admission: EM | Admit: 2016-05-01 | Discharge: 2016-05-01 | Disposition: A | Discharge status: Home / Self Care | Payer: Commercial Managed Care - HMO | Attending: Emergency Medicine | Admitting: Emergency Medicine

## 2016-05-01 ENCOUNTER — Encounter (HOSPITAL_COMMUNITY): Payer: Self-pay

## 2016-05-01 ENCOUNTER — Emergency Department (HOSPITAL_COMMUNITY): Payer: Commercial Managed Care - HMO

## 2016-05-01 DIAGNOSIS — M47816 Spondylosis without myelopathy or radiculopathy, lumbar region: Secondary | ICD-10-CM | POA: Diagnosis not present

## 2016-05-01 DIAGNOSIS — Z7982 Long term (current) use of aspirin: Secondary | ICD-10-CM | POA: Insufficient documentation

## 2016-05-01 DIAGNOSIS — M5441 Lumbago with sciatica, right side: Secondary | ICD-10-CM | POA: Diagnosis not present

## 2016-05-01 DIAGNOSIS — M549 Dorsalgia, unspecified: Secondary | ICD-10-CM | POA: Diagnosis present

## 2016-05-01 DIAGNOSIS — Z8547 Personal history of malignant neoplasm of testis: Secondary | ICD-10-CM | POA: Insufficient documentation

## 2016-05-01 DIAGNOSIS — Z79899 Other long term (current) drug therapy: Secondary | ICD-10-CM | POA: Diagnosis not present

## 2016-05-01 DIAGNOSIS — Z87891 Personal history of nicotine dependence: Secondary | ICD-10-CM | POA: Diagnosis not present

## 2016-05-01 DIAGNOSIS — M5442 Lumbago with sciatica, left side: Secondary | ICD-10-CM | POA: Diagnosis not present

## 2016-05-01 MED ORDER — HYDROMORPHONE HCL 2 MG/ML IJ SOLN
2.0000 mg | Freq: Once | INTRAMUSCULAR | Status: AC
  Administered 2016-05-01: 2 mg via INTRAMUSCULAR
  Filled 2016-05-01: qty 1

## 2016-05-01 NOTE — ED Triage Notes (Signed)
Pt presents for evaluation of lower back pain since 12/25. Pt reports no hx of same, no back sx or injury to back. Pt reports he woke up on christmas day with pain and spasms, saw PCP and was started on prednisone and muscle relaxer with minimal improvement. Pt prescribed percocet, last dose 1300 today. Pain 2/10 on arrival. Pt ambulatory, has been using walking stick. Reports new L hand tingling intermittently yesterday and today but reports it could be from gripping stick.

## 2016-05-01 NOTE — ED Notes (Signed)
Rechecked pt's pulse and it came down to 105 a min.  Pt's high HR is due to the pain he is currently feeling.

## 2016-05-01 NOTE — ED Notes (Signed)
Informed Shanon Brow - NP of pt's pulse.

## 2016-05-01 NOTE — ED Notes (Signed)
Pt verbalized understanding discharge instructions and denies any further needs or questions at this time. VS stable, ambulatory and steady gait.   

## 2016-05-01 NOTE — Discharge Instructions (Signed)
Continue with your established pain management/treatment plan.  Please follow-up with your physician and with neurosurgery as discussed.

## 2016-05-01 NOTE — ED Provider Notes (Signed)
[facility] DEPT Provider Note   CSN: [id number] Arrival date & time: 05/01/16  1357  By signing my name below, I, Judithe Modest, attest that this documentation has been prepared under the direction and in the presence of Ludwig Clarks, MD. Electronically Signed: Judithe Modest, ER Scribe. 12/10/2015. 7:01 PM.  History   Chief Complaint Chief Complaint  Patient presents with  . Back Pain   HPI HPI Comments: [name] Anderson is a 61 y.o. male who presents to the Emergency Department complaining of back pain radiating laterally for the last seven days. He denies having a specific injury. His pain is worse with movement, standing from sitting, or laying down in bed. He denies saddle anesthesia, loss of bowel or bladder control, abdominal pain. He was prescribed prednisone and muscle relaxer's nine days ago which ran out three days ago. He was then prescribed oxycodone two days ago.    Past Medical History:  Diagnosis Date  . Cancer ([place]) 09/11/13   testicular  . ED (erectile dysfunction)   . High cholesterol   . History of basal cell carcinoma excision    02/2002   --  NOSE  &   2013  RIGHT EAR  . History of kidney stones   . Hyperlipidemia   . Mild obstructive sleep apnea    PER STUDY 01-18-2005--  NO CPAP OR MOUTH GUARD  . Moderate aortic valve insufficiency   . 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 Personal history of colonic polyps    TUBULAR ADENOMA--   . Seasonal allergies   . 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[place])   . Testicular mass    RIGHT    Patient Active Problem List   Diagnosis Date Noted  . 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 Pulmonary nodules 09/28/2013  . Aortic root dilatation ([place]) 03/30/2013  . Physical exam, annual 02/11/2013  . Diverticulosis of colon without hemorrhage 02/11/2013  . Basal cell carcinoma of skin 02/11/2013  . Personal history of colonic polyps   . HYPERCHOLESTEROLEMIA 02/17/2009  . Aortic valve disorder  02/17/2009  . UMBILICAL HERNIA [id number]  . NEPHROLITHIASIS 02/17/2009  . SNORING 02/17/2009    Past Surgical History:  Procedure Laterality Date  . COLONOSCOPY  01/24/06, 07/10/11  . EXTRACORPOREAL SHOCK WAVE LITHOTRIPSY    . MOHS SURGERY  02/2002  &  2013   TIP OF NOSE-///     RIGHT EAR  . ORCHIECTOMY Right 09/11/2013   Procedure:  RIGHT ORCHIECTOMY;  Surgeon: Jorja Loa, MD;  Location: [facility];  Service: Urology;  Laterality: Right;  . SCROTAL EXPLORATION Right 09/11/2013   Procedure: RIGHT INGUINAL EXPLORATION;  Surgeon: Jorja Loa, MD;  Location: [facility];  Service: Urology;  Laterality: Right;  . TRANSTHORACIC ECHOCARDIOGRAM  02-25-2013   DR CRENSHAW   NORMAL LVSF/  EF 55-60%/   GRADE 2 DIASTOLIC DYSFUNCTION/  ECCENTRIC MODERATE AORTIC INSUFFICIENCY WITHOUT STENOSIS/  MILD DILATED AORTIC ROOT/ TRIVIAL MR, TR,  & PR  . UMBILICAL HERNIA REPAIR  06-08-2009  . URETEROSCOPIC LASER LITHOTRIPSY STONE EXTRACTION  2007       Home Medications    Prior to Admission medications   Medication Sig Start Date End Date Taking? Authorizing Provider  amoxicillin (AMOXIL) 500 MG capsule TAKE 4 TABLETS BY MOUTH 1 HOUR PRIOR TO DENTAL APPOINTMENT    Noralee Space, MD  Ascorbic Acid (VITAMIN C) 1000 MG tablet Take 1,000 mg by mouth daily as  needed. DURING WINTER    Historical Provider, MD  aspirin 81 MG tablet Take 81 mg by mouth daily.    Historical Provider, MD  atorvastatin (LIPITOR) 20 MG tablet Take 20 mg by mouth every morning. 02/16/13   Noralee Space, MD  HYPERCARE 20 % external solution Apply 1 application topically at bedtime as needed. 01/01/14   Historical Provider, MD  losartan (COZAAR) 50 MG tablet Take 1 tablet (50 mg total) by mouth daily. 03/19/16 06/17/16  Lelon Perla, MD  Multiple Vitamin (MULTIVITAMIN) tablet Take 1 tablet by mouth daily.    Historical Provider, MD  Multiple Vitamins-Minerals (ECHINACEA ACZ) CAPS Take 2  capsules by mouth daily as needed. DURING WINTER--    Historical Provider, MD  sildenafil (REVATIO) 20 MG tablet Take 20 mg by mouth daily as needed.  02/11/13   Noralee Space, MD  triamcinolone cream (KENALOG) 0.1 % Apply 1 application topically daily as needed. Apply to area after bath (DO NOT APPLY TO FACE) 01/03/14   Historical Provider, MD    Family History Family History  Problem Relation Age of Onset  . Colon cancer Father   . Heart disease Paternal Grandmother     Social History Social History  Substance Use Topics  . Smoking status: Former Smoker    Types: Pipe  . Smokeless tobacco: Former Systems developer    Types: [place] date: 04/26/2011     Comment: OCCASIONAL  PIPE SMOKER  ([place])  . Alcohol use Yes     Comment: Occasional     Allergies   Patient has no known allergies.   Review of Systems Review of Systems  Musculoskeletal: Positive for back pain.  All other systems reviewed and are negative.    Physical Exam Updated Vital Signs BP 126/82 (BP Location: Right Arm)   Pulse 103   Temp 98.6 F (37 C) (Oral)   Resp 18   SpO2 98%   Physical Exam  Constitutional: He is oriented to person, place, and time. He appears well-developed and well-nourished.  HENT:  Head: Normocephalic and atraumatic.  Eyes: EOM are normal.  Neck: Normal range of motion.  Cardiovascular: Normal rate, regular rhythm, normal heart sounds and intact distal pulses.   Pulmonary/Chest: Effort normal and breath sounds normal. No respiratory distress.  Abdominal: Soft. He exhibits no distension. There is no tenderness.  Musculoskeletal: Normal range of motion.  TTP in the soft tissues of the lumbar region. No bony tenderness or steppoff.   Neurological: He is alert and oriented to person, place, and time.  Reflex Scores:      Patellar reflexes are 2+ on the right side and 2+ on the left side.      Achilles reflexes are 2+ on the right side and 2+ on the left  side. Strength is 5/5 in both lower extremities. Able to ambulate, except with significant discomfort.   Skin: Skin is warm and dry.  Psychiatric: He has a normal mood and affect. Judgment normal.  Nursing note and vitals reviewed.   ED Treatments / Results  DIAGNOSTIC STUDIES: Oxygen Saturation is 98% on RA, normal by my interpretation.    COORDINATION OF CARE: 7:02 PM Discussed treatment plan with pt at bedside and pt agreed to plan.  Labs (all labs ordered are listed, but only abnormal results are displayed) Labs Reviewed - No data to display  EKG  EKG Interpretation None       Radiology No results found.  Procedures Procedures (including critical care time)  Medications Ordered in ED Medications - No data to display   Initial Impression / Assessment and Plan / ED Course  I have reviewed the triage vital signs and the nursing notes.  Pertinent labs & imaging results that were available during my care of the patient were reviewed by me and considered in my medical decision making (see chart for details).  Clinical Course     Patient presents here with complaints of worsening low back pain and difficulty ambulating. This is been unrelieved with medications prescribed at an outside facility. He will undergo an MRI. The results of this will be followed up by PA Etta Quill who will determine the final disposition.  Final Clinical Impressions(s) / ED Diagnoses   Final diagnoses:  None    New Prescriptions New Prescriptions   No medications on file    I personally performed the services described in this documentation, which was scribed in my presence. The recorded information has been reviewed and is accurate.           Veryl Speak, MD 05/08/16 [phone]

## 2016-05-01 NOTE — ED Provider Notes (Signed)
Patient report received from Dr. Stark Jock at shift change. MRI results reviewed and shared with patient. Patient's pain currently improved. Patient to follow-up with neurosurgery and PCP. Patient has muscle relaxant, NSAID, and oxycodone for use at home.   Mr Lumbar Spine Wo Contrast  Result Date: 05/01/2016 CLINICAL DATA:  Back pain radiating laterally for 1 week. No injury. EXAM: MRI LUMBAR SPINE WITHOUT CONTRAST TECHNIQUE: Multiplanar, multisequence MR imaging of the lumbar spine was performed. No intravenous contrast was administered. COMPARISON:  CT abdomen and pelvis fell Sep 09, 2015 FINDINGS: SEGMENTATION: For the purposes of this report, the last well-formed intervertebral disc will be described as L5-S1. ALIGNMENT: Maintenance of the lumbar lordosis. No malalignment. VERTEBRAE:Vertebral bodies are intact. Mild L2-3 through L4-5 disc height loss, with slight desiccation. Acute on chronic discogenic endplate changes [id number]. No suspicious bone marrow signal. CONUS MEDULLARIS: Conus medullaris terminates at T12-L1 and demonstrates normal morphology and signal characteristics. Cauda equina is normal. PARASPINAL AND SOFT TISSUES: Included prevertebral and paraspinal soft tissues are normal. 15 mm cyst RIGHT kidney as seen on prior imaging. DISC LEVELS: L1-2 and L2-3: No disc bulge, canal stenosis nor neural foraminal narrowing. L3-4: Small broad-based disc bulge, small LEFT extraforaminal disc protrusion. No canal stenosis or neural foraminal narrowing. L4-5: Small broad-based disc bulge. Mild facet arthropathy without canal stenosis or neural foraminal narrowing. L5-S1: No disc bulge. Moderate facet arthropathy. No canal stenosis. Mild LEFT greater than RIGHT neural foraminal narrowing. L2-3: IMPRESSION: Moderate L5-S1 facet arthropathy, otherwise early degenerative changes lumbar spine without canal stenosis. Mild L5-S1 neural foraminal narrowing. Electronically Signed   By: [name] M.D.   On:  05/01/2016 22:12      Etta Quill, NP 05/01/16 [place], MD 05/02/16 2154

## 2016-05-03 DIAGNOSIS — M4696 Unspecified inflammatory spondylopathy, lumbar region: Secondary | ICD-10-CM | POA: Diagnosis not present

## 2016-05-21 DIAGNOSIS — M5489 Other dorsalgia: Secondary | ICD-10-CM | POA: Diagnosis not present

## 2016-05-29 ENCOUNTER — Encounter: Payer: Self-pay | Admitting: Cardiology

## 2016-06-01 DIAGNOSIS — R293 Abnormal posture: Secondary | ICD-10-CM | POA: Diagnosis not present

## 2016-06-01 DIAGNOSIS — M545 Low back pain: Secondary | ICD-10-CM | POA: Diagnosis not present

## 2016-06-04 DIAGNOSIS — M5441 Lumbago with sciatica, right side: Secondary | ICD-10-CM | POA: Diagnosis not present

## 2016-06-04 DIAGNOSIS — R293 Abnormal posture: Secondary | ICD-10-CM | POA: Diagnosis not present

## 2016-06-04 DIAGNOSIS — M545 Low back pain: Secondary | ICD-10-CM | POA: Diagnosis not present

## 2016-06-08 DIAGNOSIS — M545 Low back pain: Secondary | ICD-10-CM | POA: Diagnosis not present

## 2016-06-08 DIAGNOSIS — R293 Abnormal posture: Secondary | ICD-10-CM | POA: Diagnosis not present

## 2016-06-08 DIAGNOSIS — M5441 Lumbago with sciatica, right side: Secondary | ICD-10-CM | POA: Diagnosis not present

## 2016-06-11 DIAGNOSIS — R293 Abnormal posture: Secondary | ICD-10-CM | POA: Diagnosis not present

## 2016-06-11 DIAGNOSIS — M5441 Lumbago with sciatica, right side: Secondary | ICD-10-CM | POA: Diagnosis not present

## 2016-06-11 DIAGNOSIS — M545 Low back pain: Secondary | ICD-10-CM | POA: Diagnosis not present

## 2016-06-12 DIAGNOSIS — M545 Low back pain: Secondary | ICD-10-CM | POA: Diagnosis not present

## 2016-06-12 DIAGNOSIS — M5441 Lumbago with sciatica, right side: Secondary | ICD-10-CM | POA: Diagnosis not present

## 2016-06-12 DIAGNOSIS — R293 Abnormal posture: Secondary | ICD-10-CM | POA: Diagnosis not present

## 2016-06-13 DIAGNOSIS — M5441 Lumbago with sciatica, right side: Secondary | ICD-10-CM | POA: Diagnosis not present

## 2016-06-13 DIAGNOSIS — R293 Abnormal posture: Secondary | ICD-10-CM | POA: Diagnosis not present

## 2016-06-13 DIAGNOSIS — M545 Low back pain: Secondary | ICD-10-CM | POA: Diagnosis not present

## 2016-06-18 DIAGNOSIS — M545 Low back pain: Secondary | ICD-10-CM | POA: Diagnosis not present

## 2016-06-18 DIAGNOSIS — M5441 Lumbago with sciatica, right side: Secondary | ICD-10-CM | POA: Diagnosis not present

## 2016-06-18 DIAGNOSIS — R293 Abnormal posture: Secondary | ICD-10-CM | POA: Diagnosis not present

## 2016-06-20 DIAGNOSIS — R293 Abnormal posture: Secondary | ICD-10-CM | POA: Diagnosis not present

## 2016-06-20 DIAGNOSIS — M5441 Lumbago with sciatica, right side: Secondary | ICD-10-CM | POA: Diagnosis not present

## 2016-06-20 DIAGNOSIS — M545 Low back pain: Secondary | ICD-10-CM | POA: Diagnosis not present

## 2016-08-03 DIAGNOSIS — B9789 Other viral agents as the cause of diseases classified elsewhere: Secondary | ICD-10-CM | POA: Diagnosis not present

## 2016-08-03 DIAGNOSIS — J069 Acute upper respiratory infection, unspecified: Secondary | ICD-10-CM | POA: Diagnosis not present

## 2016-08-17 ENCOUNTER — Other Ambulatory Visit: Payer: Self-pay | Admitting: Family Medicine

## 2016-08-17 ENCOUNTER — Ambulatory Visit
Admission: RE | Admit: 2016-08-17 | Discharge: 2016-08-17 | Disposition: A | Discharge status: Home / Self Care | Payer: Commercial Managed Care - HMO | Source: Ambulatory Visit | Attending: Family Medicine | Admitting: Family Medicine

## 2016-08-17 DIAGNOSIS — R05 Cough: Secondary | ICD-10-CM

## 2016-08-17 DIAGNOSIS — R61 Generalized hyperhidrosis: Secondary | ICD-10-CM | POA: Diagnosis not present

## 2016-08-17 DIAGNOSIS — R059 Cough, unspecified: Secondary | ICD-10-CM

## 2016-09-12 DIAGNOSIS — R8271 Bacteriuria: Secondary | ICD-10-CM | POA: Diagnosis not present

## 2016-09-12 DIAGNOSIS — C6290 Malignant neoplasm of unspecified testis, unspecified whether descended or undescended: Secondary | ICD-10-CM | POA: Diagnosis not present

## 2016-09-13 DIAGNOSIS — Z1211 Encounter for screening for malignant neoplasm of colon: Secondary | ICD-10-CM | POA: Diagnosis not present

## 2016-09-26 DIAGNOSIS — D649 Anemia, unspecified: Secondary | ICD-10-CM | POA: Diagnosis not present

## 2016-10-02 DIAGNOSIS — M25471 Effusion, right ankle: Secondary | ICD-10-CM | POA: Diagnosis not present

## 2016-11-14 DIAGNOSIS — H11153 Pinguecula, bilateral: Secondary | ICD-10-CM | POA: Diagnosis not present

## 2016-11-14 DIAGNOSIS — H43393 Other vitreous opacities, bilateral: Secondary | ICD-10-CM | POA: Diagnosis not present

## 2016-12-25 DIAGNOSIS — R109 Unspecified abdominal pain: Secondary | ICD-10-CM | POA: Diagnosis not present

## 2016-12-25 DIAGNOSIS — R55 Syncope and collapse: Secondary | ICD-10-CM | POA: Diagnosis not present

## 2016-12-25 DIAGNOSIS — R404 Transient alteration of awareness: Secondary | ICD-10-CM | POA: Diagnosis not present

## 2016-12-25 DIAGNOSIS — I10 Essential (primary) hypertension: Secondary | ICD-10-CM | POA: Diagnosis not present

## 2016-12-26 ENCOUNTER — Encounter: Payer: Self-pay | Admitting: Physician Assistant

## 2016-12-27 ENCOUNTER — Encounter: Payer: Self-pay | Admitting: Physician Assistant

## 2016-12-27 ENCOUNTER — Ambulatory Visit (INDEPENDENT_AMBULATORY_CARE_PROVIDER_SITE_OTHER): Payer: 59 | Admitting: Physician Assistant

## 2016-12-27 VITALS — BP 125/64 | HR 98 | Ht 71.0 in | Wt 201.0 lb

## 2016-12-27 DIAGNOSIS — I359 Nonrheumatic aortic valve disorder, unspecified: Secondary | ICD-10-CM | POA: Diagnosis not present

## 2016-12-27 DIAGNOSIS — E785 Hyperlipidemia, unspecified: Secondary | ICD-10-CM | POA: Diagnosis not present

## 2016-12-27 DIAGNOSIS — I712 Thoracic aortic aneurysm, without rupture, unspecified: Secondary | ICD-10-CM

## 2016-12-27 DIAGNOSIS — R55 Syncope and collapse: Secondary | ICD-10-CM | POA: Diagnosis not present

## 2016-12-27 NOTE — Patient Instructions (Addendum)
Medication Instructions:   NO CHANGE  LAB WORK:   Your physician recommends that you return for lab work PRIOR TO CTA IN [place]  Testing/Procedures:  Your physician has recommended that you wear an event monitor. Event monitors are medical devices that record the heart's electrical activity. Doctors most often [place] these monitors to diagnose arrhythmias. Arrhythmias are problems with the speed or rhythm of the heartbeat. The monitor is a small, portable device. You can wear one while you do your normal daily activities. This is usually used to diagnose what is causing palpitations/syncope (passing out).   Your physician has requested that you have an echocardiogram. Echocardiography is a painless test that uses sound waves to create images of your heart. It provides your doctor with information about the size and shape of your heart and how well your heart's chambers and valves are working. This procedure takes approximately one hour. There are no restrictions for this procedure. SCHEDULE IN November  Non-Cardiac CT Angiography (CTA), is a special type of CT scan that uses a computer to produce multi-dimensional views of major blood vessels throughout the body. In CT angiography, a contrast material is injected through an IV to help visualize the blood vessels CTA OF CHEST W/WO FOR FOLLOW UP OF THORACIC AORTIC ANEURYSM=SCHEDULE IN NOVEMBER    Follow-Up:  Your physician recommends that you schedule a follow-up appointment in: WITH DR [place]   If you need a refill on your cardiac medications before your next appointment, please call your pharmacy.

## 2016-12-27 NOTE — Progress Notes (Signed)
Cardiology Office Note    Date:  12/29/2016   ID:  [name], DOB [birth year], MRN [id number]  PCP:  Antony Contras, MD  Cardiologist:  Dr. Stanford Breed   Chief Complaint  Patient presents with  . Follow-up    pt states he passed out monday, states his hemoglobin is low and possibly dehydration    History of Present Illness:  [name] Anderson is a 61 y.o. male with PMH of testicular cancer, HLD, mild OSA not on CPAP, moderate AR and dilated aortic root. CTA of the chest obtained on 03/29/2016 showed stable dilatation of aortic root at 4.7 cm. Last echo obtained on 04/13/2016 showed normal EF, mild LVH, grade 1 DD, mild-to-moderate AI, aortic root 47 mm, mild MR. CTA in November 2016 showed dilated aortic root at 4.8 cm.   Patient was referred to cardiology service today for evaluation of syncope spell that happened on August 27. He says he was with a friend at the time picking up conduits off the ground and put in the truck. He was bending over and standing up repeatedly for up to 10 times prior to the event. It was also a hot day and he was not very well-hydrated that day. He has never had any feeling of dizziness prior to that day and has not had any more symptoms since. He says after working a while, he feel like everything else is spinning. He feels very dizzy and decided to go back into the building. He was so weak he had to climb up the stairs crawling. He subsequently passed out. He says he must have passed out for several minutes. By the time he woke up, EMS has already arrived. He did not lose bladder control or bowel control. EMS obtained vital signs which were reportedly to be normal. He refused to go to the ED. Although his symptom likely is brought on by repeated bending over and getting up and also the hot summer day, there was no obvious sign of hypotension right after the event. His blood pressure is normal today. Heart rate is mildly high. He still have a very significant heart murmur on  physical exam, however he has not noticed any significant worsening dyspnea on exertion recently to suggest the syncope is related to the valve issue. According to the patient, he did see his primary care provider and labwork showed his hemoglobin was lower than before right above 10. However even so, such hemoglobin does not directly explain all of his symptom. We discussed various options including continued observation versus 30 day monitor, eventually we were agreeable to proceed with a 30 day event monitor to make sure there is no arrhythmia causing the issue. He is due for CTA of chest in Nov. I also recommended repeat echo around the same time. His syncope is inconsistent with severe valve issue as he does not normally have any exertional dizziness or DOE.    Past Medical History:  Diagnosis Date  . Cancer ([place]) 09/11/13   testicular  . ED (erectile dysfunction)   . High cholesterol   . History of basal cell carcinoma excision    02/2002   --  NOSE  &   2013  RIGHT EAR  . History of kidney stones   . Hyperlipidemia   . Mild obstructive sleep apnea    PER STUDY 01-18-2005--  NO CPAP OR MOUTH GUARD  . Moderate aortic valve insufficiency   . 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 Personal history of colonic polyps    TUBULAR ADENOMA--   . Seasonal allergies   . 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[place])   . Testicular mass    RIGHT    Past Surgical History:  Procedure Laterality Date  . COLONOSCOPY  01/24/06, 07/10/11  . EXTRACORPOREAL SHOCK WAVE LITHOTRIPSY    . MOHS SURGERY  02/2002  &  2013   TIP OF NOSE-///     RIGHT EAR  . ORCHIECTOMY Right 09/11/2013   Procedure:  RIGHT ORCHIECTOMY;  Surgeon: Jorja Loa, MD;  Location: [facility];  Service: Urology;  Laterality: Right;  . SCROTAL EXPLORATION Right 09/11/2013   Procedure: RIGHT INGUINAL EXPLORATION;  Surgeon: Jorja Loa, MD;  Location: [facility];  Service: Urology;   Laterality: Right;  . TRANSTHORACIC ECHOCARDIOGRAM  02-25-2013   DR CRENSHAW   NORMAL LVSF/  EF 55-60%/   GRADE 2 DIASTOLIC DYSFUNCTION/  ECCENTRIC MODERATE AORTIC INSUFFICIENCY WITHOUT STENOSIS/  MILD DILATED AORTIC ROOT/ TRIVIAL MR, TR,  & PR  . UMBILICAL HERNIA REPAIR  06-08-2009  . URETEROSCOPIC LASER LITHOTRIPSY STONE EXTRACTION  2007    Current Medications: Outpatient Medications Prior to Visit  Medication Sig Dispense Refill  . amoxicillin (AMOXIL) 500 MG capsule TAKE 4 TABLETS BY MOUTH 1 HOUR PRIOR TO DENTAL APPOINTMENT 4 capsule 1  . Ascorbic Acid (VITAMIN C) 1000 MG tablet Take 1,000 mg by mouth daily as needed. DURING WINTER    . aspirin 81 MG tablet Take 81 mg by mouth daily.    Marland Kitchen atorvastatin (LIPITOR) 20 MG tablet Take 20 mg by mouth every morning.    Marland Kitchen HYPERCARE 20 % external solution Apply 1 application topically at bedtime as needed.  10  . Multiple Vitamin (MULTIVITAMIN) tablet Take 1 tablet by mouth daily.    . Multiple Vitamins-Minerals (ECHINACEA ACZ) CAPS Take 2 capsules by mouth daily as needed. DURING WINTER--    . sildenafil (REVATIO) 20 MG tablet Take 20 mg by mouth daily as needed.     . triamcinolone cream (KENALOG) 0.1 % Apply 1 application topically daily as needed. Apply to area after bath (DO NOT APPLY TO FACE)  1  . losartan (COZAAR) 50 MG tablet Take 1 tablet (50 mg total) by mouth daily. 90 tablet 3   No facility-administered medications prior to visit.      Allergies:   Patient has no known allergies.   Social History   Social History  . Marital status: Married    Spouse name: [name] Anderson  . Number of children: 2  . Years of education: 12   Occupational History  . [employer]- retired    Social History Main Topics  . Smoking status: Former Smoker    Types: Pipe  . Smokeless tobacco: Former Systems developer    Types: [place] date: 04/26/2011     Comment: OCCASIONAL  PIPE SMOKER  ([place])  . Alcohol use Yes     Comment: Occasional  .  Drug use: No  . Sexual activity: Not Asked   Other Topics Concern  . None   Social History Narrative   Lives with wife [name] Anderson   Caffeine use: coffee- 1 cup per day     Family History:  The patient's family history includes Colon cancer in his father; Heart disease in his paternal grandmother.   ROS:   Please see the history of present illness.    ROS All other systems reviewed  and are negative.   PHYSICAL EXAM:   VS:  BP 125/64 (BP Location: Right Arm, Cuff Size: Normal)   Pulse 98   Ht 5\' 11"  (1.803 m)   Wt 201 lb (91.2 kg)   BMI 28.03 kg/m    GEN: Well nourished, well developed, in no acute distress  HEENT: normal  Neck: no JVD, carotid bruits, or masses Cardiac: RRR; no rubs, or gallops,no edema  3/6 systolic murmur at RUSB Respiratory:  clear to auscultation bilaterally, normal work of breathing GI: soft, nontender, nondistended, + BS MS: no deformity or atrophy  Skin: warm and dry, no rash Neuro:  Alert and Oriented x 3, Strength and sensation are intact Psych: euthymic mood, full affect  Wt Readings from Last 3 Encounters:  12/27/16 201 lb (91.2 kg)  03/19/16 215 lb 3.2 oz (97.6 kg)  01/18/16 205 lb (93 kg)      Studies/Labs Reviewed:   EKG:  EKG is ordered today.  The ekg ordered today demonstrates NSR without ischemia  Recent Labs: 03/26/2016: BUN 15; Creat 0.82; Potassium 4.7; Sodium 135   Lipid Panel    Component Value Date/Time   CHOL 215 (H) 02/12/2013 0734   TRIG 127.0 02/12/2013 0734   HDL 38.40 (L) 02/12/2013 0734   CHOLHDL 6 02/12/2013 0734   VLDL 25.4 02/12/2013 0734   LDLDIRECT 160.9 02/12/2013 0734    Additional studies/ records that were reviewed today include:   CTA of chest 03/29/2016 IMPRESSION: Stable dilatation of aortic root at 4.7 cm.  Stable pulmonary nodules seen in right lung.    Echo 04/13/2016 - Left ventricle: The cavity size was normal. There was moderate   focal basal and mild concentric hypertrophy. Systolic  function   was normal. Wall motion was normal; there were no regional wall   motion abnormalities. There was an increased relative   contribution of atrial contraction to ventricular filling.   Doppler parameters are consistent with abnormal left ventricular   relaxation (grade 1 diastolic dysfunction). - Aortic valve: Trileaflet; normal thickness, mildly calcified   leaflets. There was mild to moderate regurgitation. Regurgitation   pressure half-time: 470 ms. - Aorta: Aortic root dimension: 47 mm (ED). Ascending aortic   diameter: 37 mm (S). - Aortic root: The aortic root was moderately dilated. - Ascending aorta: The ascending aorta was mildly dilated. - Mitral valve: There was mild regurgitation. - Atrial septum: There was increased thickness of the septum,   consistent with lipomatous hypertrophy. - Pulmonic valve: There was trivial regurgitation.    ASSESSMENT:    1. Syncope, unspecified syncope type   2. Thoracic aortic aneurysm without rupture ([place])   3. AVD (aortic valve disease)   4. Hyperlipidemia, unspecified hyperlipidemia type      PLAN:  In order of problems listed above:  1. Syncope: Based on records sent over by primary care provider, felt to be a combination of dehydration and low with a normal hemoglobin. I recommended a 30 day monitor as well to make sure there is no evidence of arrhythmia. Given lack of exertional shortness of breath and exertional dizziness, he symptom is unlikely to be related to the valve issue. I will repeat the echocardiogram in November, but I do not think this is urgent unless there is any further development  2. Aortic valve stenosis: Seen on previous echocardiogram, plan to repeat echo in November. He does have a loud murmur on physical exam  3. Thoracic aortic aneurysm: due for annual CTA of chest in  November of this year  4. Hyperlipidemia: Continue Lipitor    Medication Adjustments/Labs and Tests Ordered: Current medicines  are reviewed at length with the patient today.  Concerns regarding medicines are outlined above.  Medication changes, Labs and Tests ordered today are listed in the Patient Instructions below. Patient Instructions  Medication Instructions:   NO CHANGE  LAB WORK:   Your physician recommends that you return for lab work PRIOR TO CTA IN [place]  Testing/Procedures:  Your physician has recommended that you wear an event monitor. Event monitors are medical devices that record the heart's electrical activity. Doctors most often [place] these monitors to diagnose arrhythmias. Arrhythmias are problems with the speed or rhythm of the heartbeat. The monitor is a small, portable device. You can wear one while you do your normal daily activities. This is usually used to diagnose what is causing palpitations/syncope (passing out).   Your physician has requested that you have an echocardiogram. Echocardiography is a painless test that uses sound waves to create images of your heart. It provides your doctor with information about the size and shape of your heart and how well your heart's chambers and valves are working. This procedure takes approximately one hour. There are no restrictions for this procedure. SCHEDULE IN November  Non-Cardiac CT Angiography (CTA), is a special type of CT scan that uses a computer to produce multi-dimensional views of major blood vessels throughout the body. In CT angiography, a contrast material is injected through an IV to help visualize the blood vessels CTA OF CHEST W/WO FOR FOLLOW UP OF THORACIC AORTIC ANEURYSM=SCHEDULE IN NOVEMBER    Follow-Up:  Your physician recommends that you schedule a follow-up appointment in: WITH DR [place]   If you need a refill on your cardiac medications before your next appointment, please call your pharmacy.       Hilbert Corrigan, [place]  12/29/2016 8:59 AM    [facility], [place], [place]  [address] Phone: [phone]; Fax: [phone]

## 2016-12-29 ENCOUNTER — Encounter: Payer: Self-pay | Admitting: Physician Assistant

## 2017-01-10 ENCOUNTER — Encounter: Payer: Self-pay | Admitting: Physician Assistant

## 2017-01-10 ENCOUNTER — Other Ambulatory Visit (INDEPENDENT_AMBULATORY_CARE_PROVIDER_SITE_OTHER): Payer: 59

## 2017-01-10 ENCOUNTER — Ambulatory Visit (INDEPENDENT_AMBULATORY_CARE_PROVIDER_SITE_OTHER): Payer: 59

## 2017-01-10 ENCOUNTER — Ambulatory Visit (INDEPENDENT_AMBULATORY_CARE_PROVIDER_SITE_OTHER): Payer: 59 | Admitting: Physician Assistant

## 2017-01-10 VITALS — BP 134/60 | HR 84 | Ht 71.0 in | Wt 198.4 lb

## 2017-01-10 DIAGNOSIS — Z86010 Personal history of colonic polyps: Secondary | ICD-10-CM | POA: Diagnosis not present

## 2017-01-10 DIAGNOSIS — D649 Anemia, unspecified: Secondary | ICD-10-CM

## 2017-01-10 DIAGNOSIS — D509 Iron deficiency anemia, unspecified: Secondary | ICD-10-CM | POA: Diagnosis not present

## 2017-01-10 DIAGNOSIS — R55 Syncope and collapse: Secondary | ICD-10-CM | POA: Diagnosis not present

## 2017-01-10 DIAGNOSIS — Z800 Family history of malignant neoplasm of digestive organs: Secondary | ICD-10-CM | POA: Diagnosis not present

## 2017-01-10 LAB — CBC WITH DIFFERENTIAL/PLATELET
Basophils Absolute: 0.1 10*3/uL (ref 0.0–0.1)
Basophils Relative: 0.9 % (ref 0.0–3.0)
Eosinophils Absolute: 0.1 10*3/uL (ref 0.0–0.7)
Eosinophils Relative: 2 % (ref 0.0–5.0)
HCT: 31.1 % — ABNORMAL LOW (ref 39.0–52.0)
Hemoglobin: 10.2 g/dL — ABNORMAL LOW (ref 13.0–17.0)
Lymphocytes Relative: 17.9 % (ref 12.0–46.0)
Lymphs Abs: 1.1 10*3/uL (ref 0.7–4.0)
MCHC: 32.9 g/dL (ref 30.0–36.0)
MCV: 82.1 fl (ref 78.0–100.0)
Monocytes Absolute: 0.7 10*3/uL (ref 0.1–1.0)
Monocytes Relative: 12.5 % — ABNORMAL HIGH (ref 3.0–12.0)
Neutro Abs: 4 10*3/uL (ref 1.4–7.7)
Neutrophils Relative %: 66.7 % (ref 43.0–77.0)
Platelets: 260 10*3/uL (ref 150.0–400.0)
RBC: 3.79 Mil/uL — ABNORMAL LOW (ref 4.22–5.81)
RDW: 17 % — ABNORMAL HIGH (ref 11.5–15.5)
WBC: 6 10*3/uL (ref 4.0–10.5)

## 2017-01-10 LAB — FERRITIN: Ferritin: 272.8 ng/mL (ref 22.0–322.0)

## 2017-01-10 LAB — IBC PANEL
Iron: 15 ug/dL — ABNORMAL LOW (ref 42–165)
Saturation Ratios: 6.5 % — ABNORMAL LOW (ref 20.0–50.0)
Transferrin: 165 mg/dL — ABNORMAL LOW (ref 212.0–360.0)

## 2017-01-10 NOTE — Patient Instructions (Signed)
Please go to the basement level to have your labs drawn.   You have been scheduled for a colonoscopy and endoscopy. Please follow written instructions given to you at your visit today.  Please pick up your prep supplies at the pharmacy within the next 1-3 days. If you use inhalers (even only as needed), please bring them with you on the day of your procedure. Your physician has requested that you go to www.[email or web address] and enter the access code given to you at your visit today. This web site gives a general overview about your procedure. However, you should still follow specific instructions given to you by our office regarding your preparation for the procedure.

## 2017-01-10 NOTE — Progress Notes (Addendum)
Subjective:    Patient ID: [name] Anderson, male    DOB: [birth year], 61 y.o.   MRN: [id number]  HPI  [name] Anderson is a pleasant 61 year old white male, known to Dr. Carlean Purl referred today by Dr. Moreen Fowler for evaluation of new anemia. Patient was last seen here in March 2013 when he had colonoscopy. He has history of tubular adenomatous colon polyps. At colonoscopy in 2013 he was noted to have moderate diverticulosis but no polyps. Patient also has family history of colon cancer in his father deceased in his 36s. Other medical problems include, aortic valve disorder, dilated aortic root and prior history of a seminoma. Patient had recently been evaluated after he had a syncopal episode at work a couple of weeks ago. He said it had been very hot at the time and he felt overheated and became dizzy after he went outside to do some work. He says he was not well hydrated and had to be helped back into the office. It is not clear to me that he actually lost consciousness. Nevertheless labs have been done on 12/25/2016 which showed hemoglobin 10.8, hematocrit of 31.9 MCV of 81.5 chemistries normal as were liver function studies. Patient has completed Hemoccult cards which he states were negative. He has no complaints of abdominal discomfort. No changes in his bowel habits, says his bowel movements have been normal, no melena or hematochezia.  No   prior history of anemia or iron deficiency. Appetite has been somewhat decreased but he thinks this is due to working in the heat this summer. No nausea or vomiting no chronic problems with heartburn or indigestion and no dysphagia. He had been taking Advil and Aleve on a regular basis for several once which he stopped a couple of weeks ago.  Review of Systems Pertinent positive and negative review of systems were noted in the above HPI section.  All other review of systems was otherwise negative.  Outpatient Encounter Prescriptions as of 01/10/2017  Medication Sig  .  acetaminophen (TYLENOL) 500 MG tablet Take 500 mg by mouth every 6 (six) hours as needed.  Marland Kitchen amoxicillin (AMOXIL) 500 MG capsule TAKE 4 TABLETS BY MOUTH 1 HOUR PRIOR TO DENTAL APPOINTMENT  . Ascorbic Acid (VITAMIN C) 1000 MG tablet Take 1,000 mg by mouth daily as needed. DURING WINTER  . aspirin 81 MG tablet Take 81 mg by mouth daily.  Marland Kitchen atorvastatin (LIPITOR) 20 MG tablet Take 20 mg by mouth every morning.  . Ferrous Sulfate (IRON) 325 (65 Fe) MG TABS Take 1 tablet by mouth daily.  Marland Kitchen glucosamine-chondroitin 500-400 MG tablet Take 1 tablet by mouth 3 (three) times daily.  Marland Kitchen HYPERCARE 20 % external solution Apply 1 application topically at bedtime as needed.  . Multiple Vitamin (MULTIVITAMIN) tablet Take 1 tablet by mouth daily.  . Multiple Vitamins-Minerals (ECHINACEA ACZ) CAPS Take 2 capsules by mouth daily as needed. DURING WINTER--  . sildenafil (REVATIO) 20 MG tablet Take 20 mg by mouth daily as needed.   . triamcinolone cream (KENALOG) 0.1 % Apply 1 application topically daily as needed. Apply to area after bath (DO NOT APPLY TO FACE)  . losartan (COZAAR) 50 MG tablet Take 1 tablet (50 mg total) by mouth daily.   No facility-administered encounter medications on file as of 01/10/2017.    No Known Allergies Patient Active Problem List   Diagnosis Date Noted  . 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 Pulmonary nodules 09/28/2013  . Aortic root dilatation ([place]) 03/30/2013  .  Physical exam, annual 02/11/2013  . Diverticulosis of colon without hemorrhage 02/11/2013  . Basal cell carcinoma of skin 02/11/2013  . Personal history of colonic polyps   . HYPERCHOLESTEROLEMIA 02/17/2009  . Aortic valve disorder 02/17/2009  . UMBILICAL HERNIA 47/65/4650  . NEPHROLITHIASIS 02/17/2009  . SNORING 02/17/2009   Social History   Social History  . Marital status: Married    Spouse name: Coralyn Mark  . Number of children: 2  . Years of education: 12   Occupational History  . [employer]- retired     Social History Main Topics  . Smoking status: Former Smoker    Types: Pipe  . Smokeless tobacco: Former Systems developer    Types: [place] date: 04/26/2011     Comment: OCCASIONAL  PIPE SMOKER  ([place])  . Alcohol use Yes     Comment: Occasional  . Drug use: No  . Sexual activity: Not on file   Other Topics Concern  . Not on file   Social History Narrative   Lives with wife Coralyn Mark   Caffeine use: coffee- 1 cup per day    [name] Anderson family history includes Colon cancer in his father; Heart disease in his paternal grandmother.      Objective:    Vitals:   01/10/17 0859  BP: 134/60  Pulse: 84    Physical Exam  well-developed older white male in no acute distress, pleasant blood pressure 134/60 pulse 84, Height 511, weight 198, BMI of 27.6. HEENT ;nontraumatic normocephalic EOMI PERRLA sclera anicteric, Cardiovascular; regular rate and rhythm with S1-S2 no murmur rub or gallop, Pulmonary ;clear bilaterally, Abdomen,; nontender nondistended bowel sounds are active there is no palpable mass or hepatosplenomegaly bowel sounds present, Rectal; exam not done patient completed a recent Hemoccult cards which she states were negative, Extremities ;no clubbing cyanosis or edema skin warm and dry, Neuropsych; mood and affect appropriate       Assessment & Plan:   #11 61 year old white male with new finding of normocytic anemia, recent Hemoccults negative. Patient has had some fatigue and had a presyncopal episode a few weeks ago which may have been precipitated by being overheated. No specific GI complaints, and has not noted any melena or hematochezia. It is unclear at this time whether her anemia is secondary to underlying GI pathology or secondary to myelodysplastic process #2 personal history of tubular adenomatous colon polyps, last colonoscopy March 2013, no polyps-due for follow-up colonoscopy due to family history #3 positive family history of colon cancer in  patient's father, deceased in his 75s #4 aortic valve disorder #5 history of seminoma #6 diverticulosis #7 history of dilated aortic root which is being followed.  Plan; Patient will be scheduled for colonoscopy and EGD with Dr. Carlean Purl. Both procedures discussed in detail with patient including risks and benefits and he is agreeable to proceed. Repeat CBC with differential today to assure stability and also check iron studies.  Egon Dittus S Rome Schlauch PA-C 01/10/2017   Cc: Antony Contras, MD  Agree with Ms. Genia Harold assessment and plan. Gatha Mayer, MD, [facility]

## 2017-01-11 ENCOUNTER — Encounter: Payer: Self-pay | Admitting: Cardiology

## 2017-01-11 DIAGNOSIS — R55 Syncope and collapse: Secondary | ICD-10-CM | POA: Diagnosis not present

## 2017-01-28 DIAGNOSIS — D649 Anemia, unspecified: Secondary | ICD-10-CM | POA: Diagnosis not present

## 2017-01-28 DIAGNOSIS — I10 Essential (primary) hypertension: Secondary | ICD-10-CM | POA: Diagnosis not present

## 2017-01-28 DIAGNOSIS — M791 Myalgia, unspecified site: Secondary | ICD-10-CM | POA: Diagnosis not present

## 2017-01-28 DIAGNOSIS — M79675 Pain in left toe(s): Secondary | ICD-10-CM | POA: Diagnosis not present

## 2017-01-28 DIAGNOSIS — E7800 Pure hypercholesterolemia, unspecified: Secondary | ICD-10-CM | POA: Diagnosis not present

## 2017-01-31 ENCOUNTER — Telehealth: Payer: Self-pay | Admitting: Cardiology

## 2017-01-31 NOTE — Telephone Encounter (Signed)
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 He reports he ran out of batteries and has about 2 weeks left and the monitor company only sent him 2 batteries. He states he thinks he can get about another 1.5 weeks out of the battery life left. Advised that we do not have batteries in stock to provide to him and suggested he call the company to request more batteries, however he states he will be out of town and will not be able to receive them (which he states took 3 days to come in the last time he made a request). He is calling to inform that he will probably be taking the monitor off sooner, since he will not have any batteries.   Routed to [place], [place] as [name] who ordered monitor

## 2017-01-31 NOTE — Telephone Encounter (Signed)
Thank you :)

## 2017-01-31 NOTE — Telephone Encounter (Signed)
°  New Prob  Pt has requesting to speak to someone regarding needing some additional batteries for his heart monitor. Please call.

## 2017-02-06 ENCOUNTER — Encounter: Payer: 59 | Admitting: Internal Medicine

## 2017-02-12 DIAGNOSIS — M130 Polyarthritis, unspecified: Secondary | ICD-10-CM | POA: Diagnosis not present

## 2017-02-12 DIAGNOSIS — E7800 Pure hypercholesterolemia, unspecified: Secondary | ICD-10-CM | POA: Diagnosis not present

## 2017-02-12 DIAGNOSIS — R61 Generalized hyperhidrosis: Secondary | ICD-10-CM | POA: Diagnosis not present

## 2017-02-13 ENCOUNTER — Other Ambulatory Visit: Payer: Self-pay | Admitting: Family Medicine

## 2017-02-13 DIAGNOSIS — H532 Diplopia: Secondary | ICD-10-CM

## 2017-02-18 ENCOUNTER — Ambulatory Visit
Admission: RE | Admit: 2017-02-18 | Discharge: 2017-02-18 | Disposition: A | Discharge status: Home / Self Care | Payer: 59 | Source: Ambulatory Visit | Attending: Family Medicine | Admitting: Family Medicine

## 2017-02-18 DIAGNOSIS — E785 Hyperlipidemia, unspecified: Secondary | ICD-10-CM | POA: Diagnosis not present

## 2017-02-18 DIAGNOSIS — H532 Diplopia: Secondary | ICD-10-CM

## 2017-02-19 ENCOUNTER — Encounter: Payer: Self-pay | Admitting: Internal Medicine

## 2017-02-22 ENCOUNTER — Other Ambulatory Visit: Payer: Self-pay | Admitting: Family Medicine

## 2017-02-22 DIAGNOSIS — R5381 Other malaise: Secondary | ICD-10-CM

## 2017-03-05 ENCOUNTER — Ambulatory Visit (AMBULATORY_SURGERY_CENTER): Payer: 59 | Admitting: Internal Medicine

## 2017-03-05 ENCOUNTER — Encounter: Payer: Self-pay | Admitting: Internal Medicine

## 2017-03-05 VITALS — BP 120/55 | HR 73 | Temp 97.1°F | Resp 26 | Ht 71.0 in | Wt 198.0 lb

## 2017-03-05 DIAGNOSIS — D649 Anemia, unspecified: Secondary | ICD-10-CM | POA: Diagnosis not present

## 2017-03-05 DIAGNOSIS — Z800 Family history of malignant neoplasm of digestive organs: Secondary | ICD-10-CM | POA: Diagnosis not present

## 2017-03-05 DIAGNOSIS — K229 Disease of esophagus, unspecified: Secondary | ICD-10-CM

## 2017-03-05 DIAGNOSIS — K2270 Barrett's esophagus without dysplasia: Secondary | ICD-10-CM | POA: Diagnosis not present

## 2017-03-05 DIAGNOSIS — K2289 Other specified disease of esophagus: Secondary | ICD-10-CM

## 2017-03-05 DIAGNOSIS — Z8679 Personal history of other diseases of the circulatory system: Secondary | ICD-10-CM

## 2017-03-05 DIAGNOSIS — K2970 Gastritis, unspecified, without bleeding: Secondary | ICD-10-CM | POA: Diagnosis not present

## 2017-03-05 DIAGNOSIS — K2990 Gastroduodenitis, unspecified, without bleeding: Secondary | ICD-10-CM

## 2017-03-05 DIAGNOSIS — I10 Essential (primary) hypertension: Secondary | ICD-10-CM | POA: Diagnosis not present

## 2017-03-05 DIAGNOSIS — Z86010 Personal history of colonic polyps: Secondary | ICD-10-CM

## 2017-03-05 HISTORY — DX: Personal history of other diseases of the circulatory system: Z86.79

## 2017-03-05 MED ORDER — SODIUM CHLORIDE 0.9 % IV SOLN: 500.0000 mL | INTRAVENOUS | Status: DC

## 2017-03-05 NOTE — Op Note (Signed)
[facility] Patient Name: [name] Anderson Procedure Date: 03/05/2017 1:58 PM MRN: [id number] Endoscopist: Gatha Mayer , MD Age: 61 Referring MD:  Date of Birth: 1956-02-04 Gender: Male Account #: [id number] Procedure:                Upper GI endoscopy Indications:              Anemia Medicines:                Propofol per Anesthesia, Monitored Anesthesia Care Procedure:                Pre-Anesthesia Assessment:                           - Prior to the procedure, a History and Physical                            was performed, and patient medications and                            allergies were reviewed. The patient's tolerance of                            previous anesthesia was also reviewed. The risks                            and benefits of the procedure and the sedation                            options and risks were discussed with the patient.                            All questions were answered, and informed consent                            was obtained. Prior Anticoagulants: The patient has                            taken no previous anticoagulant or antiplatelet                            agents. ASA Grade Assessment: II - A patient with                            mild systemic disease. After reviewing the risks                            and benefits, the patient was deemed in                            satisfactory condition to undergo the procedure.                           After obtaining informed consent, the endoscope was  passed under direct vision. Throughout the                            procedure, the patient's blood pressure, pulse, and                            oxygen saturations were monitored continuously. The                            Endoscope was introduced through the mouth, and                            advanced to the second part of duodenum. The upper                            GI endoscopy was accomplished  without difficulty.                            The patient tolerated the procedure well. Scope In: Scope Out: Findings:                 There were esophageal mucosal changes suggestive of                            short-segment Barrett's esophagus present in the                            distal esophagus. The maximum longitudinal extent                            of these mucosal changes was 15 mm in length.                            Mucosa was biopsied with a cold forceps for                            histology. One specimen bottle was sent to                            pathology. Verification of patient identification                            for the specimen was done. Estimated blood loss was                            minimal.                           Diffuse moderate inflammation characterized by                            congestion (edema), erythema, friability and                            granularity was found in the  gastric antrum.                            Biopsies were taken with a cold forceps for                            Helicobacter pylori testing using CLOtest.                            Verification of patient identification for the                            specimen was done. Estimated blood loss was minimal.                           The exam was otherwise without abnormality.                           The cardia and gastric fundus were normal on                            retroflexion. Complications:            No immediate complications. Estimated Blood Loss:     Estimated blood loss was minimal. Impression:               - Esophageal mucosal changes suggestive of                            short-segment Barrett's esophagus. Biopsied.                           - Gastritis. Biopsied.                           - The examination was otherwise normal. Recommendation:           - Patient has a contact number available for                             emergencies. The signs and symptoms of potential                            delayed complications were discussed with the                            patient. Return to normal activities tomorrow.                            Written discharge instructions were provided to the                            patient.                           - Resume previous diet.                           -  Continue present medications.                           - Await pathology results.                           - See the other procedure note for documentation of                            additional recommendations. Gatha Mayer, MD 03/05/2017 2:41:50 PM This report has been signed electronically.

## 2017-03-05 NOTE — Progress Notes (Signed)
Report to PACU, RN, vss, BBS= Clear.  

## 2017-03-05 NOTE — Progress Notes (Signed)
Called to room to assist during endoscopic procedure.  Patient ID and intended procedure confirmed with present staff. Received instructions for my participation in the procedure from the performing physician.  

## 2017-03-05 NOTE — Op Note (Signed)
[facility] Patient Name: [name] Anderson Procedure Date: 03/05/2017 1:57 PM MRN: [id number] Endoscopist: Gatha Mayer , MD Age: 61 Referring MD:  Date of Birth: 09/12/1955 Gender: Male Account #: [id number] Procedure:                Colonoscopy Indications:              Surveillance: Personal history of adenomatous                            polyps on last colonoscopy > 5 years ago Medicines:                Propofol per Anesthesia, Monitored Anesthesia Care Procedure:                Pre-Anesthesia Assessment:                           - Prior to the procedure, a History and Physical                            was performed, and patient medications and                            allergies were reviewed. The patient's tolerance of                            previous anesthesia was also reviewed. The risks                            and benefits of the procedure and the sedation                            options and risks were discussed with the patient.                            All questions were answered, and informed consent                            was obtained. Prior Anticoagulants: The patient has                            taken no previous anticoagulant or antiplatelet                            agents. ASA Grade Assessment: II - A patient with                            mild systemic disease. After reviewing the risks                            and benefits, the patient was deemed in                            satisfactory condition to undergo the procedure.  After obtaining informed consent, the colonoscope                            was passed under direct vision. Throughout the                            procedure, the patient's blood pressure, pulse, and                            oxygen saturations were monitored continuously. The                            Colonoscope was introduced through the anus and   advanced to the the cecum, identified by                            appendiceal orifice and ileocecal valve. The                            colonoscopy was performed without difficulty. The                            patient tolerated the procedure well. The quality                            of the bowel preparation was excellent. The bowel                            preparation used was Miralax. The ileocecal valve,                            appendiceal orifice, and rectum were photographed. Scope In: 2:25:45 PM Scope Out: 2:33:43 PM Scope Withdrawal Time: 0 hours 6 minutes 7 seconds  Total Procedure Duration: 0 hours 7 minutes 58 seconds  Findings:                 The perianal and digital rectal examinations were                            normal. Pertinent negatives include normal prostate                            (size, shape, and consistency).                           Multiple small and large-mouthed diverticula were                            found in the sigmoid colon.                           The exam was otherwise without abnormality on                            direct and retroflexion views. Complications:  No immediate complications. Estimated Blood Loss:     Estimated blood loss: none. Impression:               - Diverticulosis in the sigmoid colon.                           - The examination was otherwise normal on direct                            and retroflexion views.                           - No specimens collected. Recommendation:           - Patient has a contact number available for                            emergencies. The signs and symptoms of potential                            delayed complications were discussed with the                            patient. Return to normal activities tomorrow.                            Written discharge instructions were provided to the                            patient.                           -  Resume previous diet.                           - Continue present medications.                           - Repeat colonoscopy in 10 years for surveillance. Gatha Mayer, MD 03/05/2017 2:43:35 PM This report has been signed electronically.

## 2017-03-05 NOTE — Patient Instructions (Addendum)
   No signs of cancer or anything bad.  Saw some changes in lining of esophagus that could be something called Barrett's esophagus and some inflammation in the stomach - took biopsies.  No polyps or cancer in colon. You do have diverticulosis - thickened muscle rings and pouches in the colon wall. Please read the handout about this condition.   Next routine colonoscopy or other screening test in 10 years - 2028  I will call other results and plans.  I appreciate the opportunity to care for you. Gatha Mayer, MD, [facility]  **Handouts given on Gastritis and diverticulosis**  YOU HAD AN ENDOSCOPIC PROCEDURE TODAY: Refer to the procedure report and other information in the discharge instructions given to you for any specific questions about what was found during the examination. If this information does not answer your questions, please call [place] office at [phone] to clarify.   YOU SHOULD EXPECT: Some feelings of bloating in the abdomen. Passage of more gas than usual. Walking can help get rid of the air that was put into your GI tract during the procedure and reduce the bloating. If you had a lower endoscopy (such as a colonoscopy or flexible sigmoidoscopy) you may notice spotting of blood in your stool or on the toilet paper. Some abdominal soreness may be present for a day or two, also.  DIET: Your first meal following the procedure should be a light meal and then it is ok to progress to your normal diet. A half-sandwich or bowl of soup is an example of a good first meal. Heavy or fried foods are harder to digest and may make you feel nauseous or bloated. Drink plenty of fluids but you should avoid alcoholic beverages for 24 hours. If you had a esophageal dilation, please see attached instructions for diet.    ACTIVITY: Your care partner should take you home directly after the procedure. You should plan to take it easy, moving slowly for the rest of the day. You can resume normal  activity the day after the procedure however YOU SHOULD NOT DRIVE, use power tools, machinery or perform tasks that involve climbing or major physical exertion for 24 hours (because of the sedation medicines used during the test).   SYMPTOMS TO REPORT IMMEDIATELY: A gastroenterologist can be reached at any hour. Please call [phone]  for any of the following symptoms:  Following lower endoscopy (colonoscopy, flexible sigmoidoscopy) Excessive amounts of blood in the stool  Significant tenderness, worsening of abdominal pains  Swelling of the abdomen that is new, acute  Fever of 100 or higher  Following upper endoscopy (EGD, EUS, ERCP, esophageal dilation) Vomiting of blood or coffee ground material  New, significant abdominal pain  New, significant chest pain or pain under the shoulder blades  Painful or persistently difficult swallowing  New shortness of breath  Black, tarry-looking or red, bloody stools  FOLLOW UP:  If any biopsies were taken you will be contacted by phone or by letter within the next 1-3 weeks. Call [phone]  if you have not heard about the biopsies in 3 weeks.  Please also call with any specific questions about appointments or follow up tests.

## 2017-03-06 ENCOUNTER — Telehealth: Payer: Self-pay | Admitting: *Deleted

## 2017-03-06 LAB — HELICOBACTER PYLORI SCREEN-BIOPSY: UREASE: NEGATIVE

## 2017-03-06 NOTE — Telephone Encounter (Signed)
  Follow up Call-  Call back number 03/05/2017  Post procedure Call Back phone  # [phone]  Permission to leave phone message Yes  Some recent data might be hidden     Patient questions:  Do you have a fever, pain , or abdominal swelling? No. Pain Score  0 *  Have you tolerated food without any problems? Yes.    Have you been able to return to your normal activities? Yes.    Do you have any questions about your discharge instructions: Diet   No. Medications  No. Follow up visit  No.  Do you have questions or concerns about your Care? No.  Actions: * If pain score is 4 or above: No action needed, pain <4.

## 2017-03-07 ENCOUNTER — Other Ambulatory Visit: Payer: Self-pay | Admitting: Family Medicine

## 2017-03-07 DIAGNOSIS — H532 Diplopia: Secondary | ICD-10-CM

## 2017-03-12 ENCOUNTER — Ambulatory Visit
Admission: RE | Admit: 2017-03-12 | Discharge: 2017-03-12 | Disposition: A | Discharge status: Home / Self Care | Payer: 59 | Source: Ambulatory Visit | Attending: Family Medicine | Admitting: Family Medicine

## 2017-03-12 DIAGNOSIS — R42 Dizziness and giddiness: Secondary | ICD-10-CM | POA: Diagnosis not present

## 2017-03-12 DIAGNOSIS — H532 Diplopia: Secondary | ICD-10-CM

## 2017-03-13 ENCOUNTER — Encounter: Payer: Self-pay | Admitting: Internal Medicine

## 2017-03-13 DIAGNOSIS — K2270 Barrett's esophagus without dysplasia: Secondary | ICD-10-CM | POA: Insufficient documentation

## 2017-03-13 HISTORY — DX: Barrett's esophagus without dysplasia: K22.70

## 2017-03-13 NOTE — Progress Notes (Signed)
Please call patient from office 1) No stomach infection causing gastritis - must be form medications - no tx needed 2) He does have Barrett's esophagus - pre-cancerous condition - rec repeat EGD in 5 years to check on it needs 5 yr recall 3) If he has heartburn symptoms should treat with acid blocking medication - if no heartburn symptoms then no need to do that - let me know if having sxs 4) His anemia does not seem to be related to any GI bleeding - we did not find any cause of the anemia - f/u PCP on this and PCP needs copy of this info 5) I also sent him a letter re: Barrett's

## 2017-03-18 ENCOUNTER — Other Ambulatory Visit: Payer: 59 | Admitting: *Deleted

## 2017-03-18 ENCOUNTER — Encounter: Payer: Self-pay | Admitting: Cardiology

## 2017-03-18 DIAGNOSIS — I712 Thoracic aortic aneurysm, without rupture: Secondary | ICD-10-CM | POA: Diagnosis not present

## 2017-03-18 NOTE — Progress Notes (Signed)
HPI: FU aortic insufficiency. Echocardiogram repeated December 2017 and showed normal LV function, grade 1 diastolic dysfunction, mild to moderate aortic insufficiency, dilated aorta, mild mitral regurgitation. CTA November 2017 showed stable aortic root dilatation at 4.7 cm. Had syncope 8/18. Monitor in September 2018 showed no significant arrhythmia. Carotid Dopplers October 2018 showed no significant stenosis. Brain MRA November 2018 normal. Echocardiogram repeated November 2018. Ejection fraction 50-55%, severe left ventricular enlargement, grade 1 diastolic dysfunction, severe aortic insufficiency, dilated aortic root at 46 mm, mild mitral regurgitation and mild left atrial enlargement. CTA of thoracic aorta repeated November 2018. The aortic root measured 4.7 cm. There were wedge-shaped lesions in the spleen question splenic infarcts. Since last seen, patient denies dyspnea, chest pain or palpitations. He occasionally has some dizziness but has had no recurrent syncope. He has had transient double vision but previous MRI negative.  Current Outpatient Medications  Medication Sig Dispense Refill  . acetaminophen (TYLENOL) 500 MG tablet Take 500 mg by mouth every 6 (six) hours as needed.    Marland Kitchen amoxicillin (AMOXIL) 500 MG capsule TAKE 4 TABLETS BY MOUTH 1 HOUR PRIOR TO DENTAL APPOINTMENT 4 capsule 1  . Ascorbic Acid (VITAMIN C) 1000 MG tablet Take 1,000 mg by mouth daily as needed. DURING WINTER    . aspirin 81 MG tablet Take 81 mg by mouth daily.    . Ferrous Sulfate (IRON) 325 (65 Fe) MG TABS Take 1 tablet by mouth daily.    Marland Kitchen glucosamine-chondroitin 500-400 MG tablet Take 1 tablet by mouth 3 (three) times daily.    Marland Kitchen HYPERCARE 20 % external solution Apply 1 application topically at bedtime as needed.  10  . Multiple Vitamin (MULTIVITAMIN) tablet Take 1 tablet by mouth daily.    . Multiple Vitamins-Minerals (ECHINACEA ACZ) CAPS Take 2 capsules by mouth daily as needed. DURING WINTER--    .  sildenafil (REVATIO) 20 MG tablet Take 20 mg by mouth daily as needed.     . triamcinolone cream (KENALOG) 0.1 % Apply 1 application topically daily as needed. Apply to area after bath (DO NOT APPLY TO FACE)  1  . losartan (COZAAR) 50 MG tablet Take 1 tablet (50 mg total) by mouth daily. 90 tablet 3   No current facility-administered medications for this visit.      Past Medical History:  Diagnosis Date  . Allergy   . Anemia   . Barrett's esophagus - short segment 03/13/2017  . Cancer ([place]) 09/11/13   testicular  . ED (erectile dysfunction)   . H/O aneurysm 03/05/2017  . High cholesterol   . History of basal cell carcinoma excision    02/2002   --  NOSE  &   2013  RIGHT EAR  . History of kidney stones   . Hyperlipidemia   . Mild obstructive sleep apnea    PER STUDY 01-18-2005--  NO CPAP OR MOUTH GUARD  . Moderate aortic valve insufficiency   . 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 Personal history of colonic polyps    TUBULAR ADENOMA--   . Seasonal allergies   . 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[place])   . Testicular mass    RIGHT    Past Surgical History:  Procedure Laterality Date  . COLONOSCOPY  01/24/06, 07/10/11  . EXTRACORPOREAL SHOCK WAVE LITHOTRIPSY    . MOHS SURGERY  02/2002  &  2013   TIP OF NOSE-///     RIGHT EAR  .  ORCHIECTOMY Right 09/11/2013   Procedure:  RIGHT ORCHIECTOMY;  Surgeon: Jorja Loa, MD;  [facility];  Service: Urology;  Laterality: Right;  . SCROTAL EXPLORATION Right 09/11/2013   Procedure: RIGHT INGUINAL EXPLORATION;  Surgeon: Jorja Loa, MD;  Location: [facility];  Service: Urology;  Laterality: Right;  . TRANSTHORACIC ECHOCARDIOGRAM  02-25-2013   DR Uchenna Seufert   NORMAL LVSF/  EF 55-60%/   GRADE 2 DIASTOLIC DYSFUNCTION/  ECCENTRIC MODERATE AORTIC INSUFFICIENCY WITHOUT STENOSIS/  MILD DILATED AORTIC ROOT/ TRIVIAL MR, TR,  & PR  . UMBILICAL HERNIA REPAIR  06-08-2009  .  URETEROSCOPIC LASER LITHOTRIPSY STONE EXTRACTION  2007    Social History   Socioeconomic History  . Marital status: Married    Spouse name: Coralyn Mark  . Number of children: 2  . Years of education: 32  . Highest education level: Not on file  Social Needs  . Financial resource strain: Not on file  . Food insecurity - worry: Not on file  . Food insecurity - inability: Not on file  . Transportation needs - medical: Not on file  . Transportation needs - non-medical: Not on file  Occupational History  . 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 Smoking status: Former Smoker    Types: Pipe  . Smokeless tobacco: Former Systems developer    Types: [place] date: 04/26/2011  . Tobacco comment: OCCASIONAL  PIPE SMOKER  (NEVER SMOKED CIGARETTES)  Substance and Sexual Activity  . Alcohol use: Yes    Comment: Occasional  . Drug use: No  . Sexual activity: Not on file  Other Topics Concern  . Not on file  Social History Narrative   Lives with wife Coralyn Mark   Caffeine use: coffee- 1 cup per day    Family History  Problem Relation Age of Onset  . Colon cancer Father   . Heart disease Paternal Grandmother     ROS: occasional dizziness but no fevers or chills, productive cough, hemoptysis, dysphasia, odynophagia, melena, hematochezia, dysuria, hematuria, rash, seizure activity, orthopnea, PND, pedal edema, claudication. Remaining systems are negative.  Physical Exam: Well-developed well-nourished in no acute distress.  Skin is warm and dry.  HEENT is normal.  Neck is supple.  Chest is clear to auscultation with normal expansion.  Cardiovascular exam is regular rate and rhythm. 2/6 DM LSB Abdominal exam nontender or distended. No masses palpated. Extremities show no edema. neuro grossly intact   A/P  1 aortic insufficiency-most recent echocardiogram shows aortic insufficiency has worsened and is now severe. Left ventricular function is reduced compared to previous and there is LVE (LVESD  46 mm). He is not having symptoms but based on decreasing LV function would favor aortic valve replacement. He will need cardiac catheterization prior to procedure. The risks and benefits including myocardial infarction, CVA and death discussed and he agrees to proceed. Will arrange evaluation with cardiothoracic surgery.   2 thoracic aortic aneurysm-patient will need aortic root replacement at time of aortic valve surgery.  3 hypertension-blood pressure is controlled. Continue present medications.  4 hyperlipidemia-continue statin.  5 ? Splenic infarcts-noted on CT scan. There is question of lymphoma based on radiology report. Will arrange hematology evaluation. Given question infarcts I will also arrange a transesophageal echocardiogram to better assess aortic insufficiency and rule outsource of embolus including LAA thrombus. No H/O atrial fibrillation. Doubt splenic infarcts given no history of abdominal pain. However he has also had transient double vision. Question TIAs.  Note patient had recent EGD for anemia and there was possible Barrett's esophagus and moderate inflammation in the gastric antrum. Colonoscopy showed diverticula.  Kirk Ruths, MD

## 2017-03-19 ENCOUNTER — Ambulatory Visit (INDEPENDENT_AMBULATORY_CARE_PROVIDER_SITE_OTHER)
Admission: RE | Admit: 2017-03-19 | Discharge: 2017-03-19 | Disposition: A | Discharge status: Home / Self Care | Payer: 59 | Source: Ambulatory Visit | Attending: Physician Assistant | Admitting: Physician Assistant

## 2017-03-19 ENCOUNTER — Other Ambulatory Visit: Payer: Self-pay

## 2017-03-19 ENCOUNTER — Ambulatory Visit (HOSPITAL_COMMUNITY): Payer: 59 | Attending: Cardiology

## 2017-03-19 DIAGNOSIS — Z87891 Personal history of nicotine dependence: Secondary | ICD-10-CM | POA: Insufficient documentation

## 2017-03-19 DIAGNOSIS — I712 Thoracic aortic aneurysm, without rupture, unspecified: Secondary | ICD-10-CM

## 2017-03-19 DIAGNOSIS — E785 Hyperlipidemia, unspecified: Secondary | ICD-10-CM | POA: Diagnosis not present

## 2017-03-19 DIAGNOSIS — G4733 Obstructive sleep apnea (adult) (pediatric): Secondary | ICD-10-CM | POA: Insufficient documentation

## 2017-03-19 DIAGNOSIS — I359 Nonrheumatic aortic valve disorder, unspecified: Secondary | ICD-10-CM | POA: Insufficient documentation

## 2017-03-19 DIAGNOSIS — Z8679 Personal history of other diseases of the circulatory system: Secondary | ICD-10-CM | POA: Diagnosis not present

## 2017-03-19 LAB — BASIC METABOLIC PANEL
BUN/Creatinine Ratio: 16 (ref 10–24)
BUN: 15 mg/dL (ref 8–27)
CO2: 22 mmol/L (ref 20–29)
Calcium: 8.7 mg/dL (ref 8.6–10.2)
Chloride: 103 mmol/L (ref 96–106)
Creatinine, Ser: 0.92 mg/dL (ref 0.76–1.27)
GFR calc Af Amer: 103 mL/min/{1.73_m2} (ref >59)
GFR calc non Af Amer: 89 mL/min/{1.73_m2} (ref >59)
Glucose: 105 mg/dL — ABNORMAL HIGH (ref 65–99)
Potassium: 4.6 mmol/L (ref 3.5–5.2)
Sodium: 138 mmol/L (ref 134–144)

## 2017-03-19 MED ORDER — IOPAMIDOL (ISOVUE-370) INJECTION 76%
100.0000 mL | Freq: Once | INTRAVENOUS | Status: AC | PRN
  Administered 2017-03-19: 100 mL via INTRAVENOUS

## 2017-03-19 NOTE — Progress Notes (Signed)
Thank you. I have discussed with Dr. Laurence Ferrari, it is unusual for patient to have splenic infarct with any abdominal symptom. Lymphoma in the spleen can occur but is very rare, image is less consistent with lymphoma, WBC recently normal. Dr. Laurence Ferrari does not feel strongly about CTA of abdomen now as this CTA of chest can see all of the spleen. Although if lymphoma is suspected, CTA of abdomen in 3 month is not unreasonable. Other question include thrombus, note his echo today also showed severe aortic stenosis, therefore likely will require a TEE anyway.

## 2017-03-19 NOTE — Progress Notes (Signed)
Correction: it is unusual for patient to have splenic infarct without any abdominal symptom. (confirmed with patient again, no abdominal pain in the past few month)

## 2017-03-20 ENCOUNTER — Encounter: Payer: Self-pay | Admitting: Physician Assistant

## 2017-03-20 NOTE — Progress Notes (Signed)
Forward to PCP.

## 2017-03-28 ENCOUNTER — Ambulatory Visit: Payer: 59 | Admitting: Cardiology

## 2017-03-28 ENCOUNTER — Encounter: Payer: Self-pay | Admitting: *Deleted

## 2017-03-28 ENCOUNTER — Other Ambulatory Visit: Payer: Self-pay | Admitting: *Deleted

## 2017-03-28 ENCOUNTER — Encounter: Payer: Self-pay | Admitting: Cardiology

## 2017-03-28 VITALS — BP 118/60 | HR 84 | Ht 71.0 in | Wt 194.0 lb

## 2017-03-28 DIAGNOSIS — I712 Thoracic aortic aneurysm, without rupture, unspecified: Secondary | ICD-10-CM

## 2017-03-28 DIAGNOSIS — Z01818 Encounter for other preprocedural examination: Secondary | ICD-10-CM | POA: Diagnosis not present

## 2017-03-28 DIAGNOSIS — I359 Nonrheumatic aortic valve disorder, unspecified: Secondary | ICD-10-CM

## 2017-03-28 DIAGNOSIS — R9389 Abnormal findings on diagnostic imaging of other specified body structures: Secondary | ICD-10-CM

## 2017-03-28 DIAGNOSIS — D649 Anemia, unspecified: Secondary | ICD-10-CM | POA: Diagnosis not present

## 2017-03-28 NOTE — Patient Instructions (Signed)
   [facility] [address] [place] [address] Dept: [phone] Loc: [place]  03/28/2017  You are scheduled for a Cardiac Catheterization on Tuesday, November 30 with Dr. Lauree Chandler.  1. Please arrive at the [facility] (Main Entrance A) at [facility]: [place], [place] [address] at 10:00 AM (two hours before your procedure to ensure your preparation). Free valet parking service is available.   Special note: Every effort is made to have your procedure done on time. Please understand that emergencies sometimes delay scheduled procedures.  2. Diet: Do not eat or drink anything after midnight prior to your procedure except sips of water to take medications.  3. Labs: Your physician recommends that you HAVE LAB WORK TODAY  4. Medication instructions in preparation for your procedure:  On the morning of your procedure, take your Aspirin and any morning medicines NOT listed above.  You may use sips of water.  5. Plan for one night stay--bring personal belongings. 6. Bring a current list of your medications and current insurance cards. 7. You MUST have a responsible person to drive you home. 8. Someone MUST be with you the first 24 hours after you arrive home or your discharge will be delayed. 9. Please wear clothes that are easy to get on and off and wear slip-on shoes.  Thank you for allowing [place] to care for you!   -- [facility]     REFERRAL TO HEMATOLOGY FOR ANEMIA AND ABNORMAL CT SCAN  REFERRAL TO TCTS FOR AVD  Your physician recommends that you schedule a follow-up appointment in: [place]

## 2017-03-29 ENCOUNTER — Ambulatory Visit (HOSPITAL_COMMUNITY)
Admission: RE | Admit: 2017-03-29 | Discharge: 2017-03-29 | Disposition: A | Discharge status: Home / Self Care | Payer: 59 | Source: Ambulatory Visit | Attending: Cardiology | Admitting: Cardiology

## 2017-03-29 ENCOUNTER — Other Ambulatory Visit: Payer: Self-pay

## 2017-03-29 ENCOUNTER — Encounter (HOSPITAL_COMMUNITY)
Admission: AD | Disposition: A | Discharge status: Home Health | Payer: Self-pay | Source: Ambulatory Visit | Attending: Cardiology

## 2017-03-29 ENCOUNTER — Encounter (HOSPITAL_COMMUNITY): Payer: Self-pay | Admitting: Cardiology

## 2017-03-29 ENCOUNTER — Inpatient Hospital Stay (HOSPITAL_COMMUNITY): Payer: 59

## 2017-03-29 ENCOUNTER — Inpatient Hospital Stay (HOSPITAL_COMMUNITY)
Admission: AD | Admit: 2017-03-29 | Discharge: 2017-04-03 | DRG: 289 | Disposition: A | Discharge status: Home Health | Payer: 59 | Source: Ambulatory Visit | Attending: Cardiology | Admitting: Cardiology

## 2017-03-29 DIAGNOSIS — D735 Infarction of spleen: Secondary | ICD-10-CM | POA: Diagnosis present

## 2017-03-29 DIAGNOSIS — E7800 Pure hypercholesterolemia, unspecified: Secondary | ICD-10-CM | POA: Diagnosis present

## 2017-03-29 DIAGNOSIS — I719 Aortic aneurysm of unspecified site, without rupture: Secondary | ICD-10-CM | POA: Diagnosis not present

## 2017-03-29 DIAGNOSIS — R7881 Bacteremia: Secondary | ICD-10-CM | POA: Diagnosis present

## 2017-03-29 DIAGNOSIS — R5383 Other fatigue: Secondary | ICD-10-CM

## 2017-03-29 DIAGNOSIS — I519 Heart disease, unspecified: Secondary | ICD-10-CM | POA: Diagnosis not present

## 2017-03-29 DIAGNOSIS — I359 Nonrheumatic aortic valve disorder, unspecified: Secondary | ICD-10-CM | POA: Diagnosis not present

## 2017-03-29 DIAGNOSIS — B957 Other staphylococcus as the cause of diseases classified elsewhere: Secondary | ICD-10-CM | POA: Diagnosis present

## 2017-03-29 DIAGNOSIS — Z8547 Personal history of malignant neoplasm of testis: Secondary | ICD-10-CM | POA: Diagnosis not present

## 2017-03-29 DIAGNOSIS — I339 Acute and subacute endocarditis, unspecified: Secondary | ICD-10-CM | POA: Diagnosis not present

## 2017-03-29 DIAGNOSIS — I712 Thoracic aortic aneurysm, without rupture: Secondary | ICD-10-CM | POA: Diagnosis not present

## 2017-03-29 DIAGNOSIS — I38 Endocarditis, valve unspecified: Secondary | ICD-10-CM

## 2017-03-29 DIAGNOSIS — Z7982 Long term (current) use of aspirin: Secondary | ICD-10-CM | POA: Diagnosis not present

## 2017-03-29 DIAGNOSIS — K2270 Barrett's esophagus without dysplasia: Secondary | ICD-10-CM | POA: Diagnosis present

## 2017-03-29 DIAGNOSIS — I080 Rheumatic disorders of both mitral and aortic valves: Secondary | ICD-10-CM | POA: Diagnosis present

## 2017-03-29 DIAGNOSIS — G4733 Obstructive sleep apnea (adult) (pediatric): Secondary | ICD-10-CM | POA: Diagnosis present

## 2017-03-29 DIAGNOSIS — R6883 Chills (without fever): Secondary | ICD-10-CM | POA: Diagnosis not present

## 2017-03-29 DIAGNOSIS — I350 Nonrheumatic aortic (valve) stenosis: Secondary | ICD-10-CM | POA: Diagnosis not present

## 2017-03-29 DIAGNOSIS — B9561 Methicillin susceptible Staphylococcus aureus infection as the cause of diseases classified elsewhere: Secondary | ICD-10-CM | POA: Diagnosis present

## 2017-03-29 DIAGNOSIS — D649 Anemia, unspecified: Secondary | ICD-10-CM | POA: Diagnosis present

## 2017-03-29 DIAGNOSIS — I330 Acute and subacute infective endocarditis: Principal | ICD-10-CM | POA: Diagnosis present

## 2017-03-29 DIAGNOSIS — I351 Nonrheumatic aortic (valve) insufficiency: Secondary | ICD-10-CM | POA: Diagnosis not present

## 2017-03-29 DIAGNOSIS — I10 Essential (primary) hypertension: Secondary | ICD-10-CM | POA: Diagnosis present

## 2017-03-29 DIAGNOSIS — Z87891 Personal history of nicotine dependence: Secondary | ICD-10-CM

## 2017-03-29 DIAGNOSIS — I77810 Thoracic aortic ectasia: Secondary | ICD-10-CM | POA: Diagnosis present

## 2017-03-29 HISTORY — PX: TEE WITHOUT CARDIOVERSION: SHX5443

## 2017-03-29 HISTORY — DX: Endocarditis, valve unspecified: I38

## 2017-03-29 LAB — CBC
Hematocrit: 34.8 % — ABNORMAL LOW (ref 37.5–51.0)
Hemoglobin: 11.3 g/dL — ABNORMAL LOW (ref 13.0–17.7)
MCH: 25.3 pg — ABNORMAL LOW (ref 26.6–33.0)
MCHC: 32.5 g/dL (ref 31.5–35.7)
MCV: 78 fL — ABNORMAL LOW (ref 79–97)
Platelets: 268 10*3/uL (ref 150–379)
RBC: 4.46 x10E6/uL (ref 4.14–5.80)
RDW: 17.5 % — ABNORMAL HIGH (ref 12.3–15.4)
WBC: 9.3 10*3/uL (ref 3.4–10.8)

## 2017-03-29 LAB — CBC WITH DIFFERENTIAL/PLATELET
Basophils Absolute: 0 10*3/uL (ref 0.0–0.1)
Basophils Relative: 0 %
Eosinophils Absolute: 0.2 10*3/uL (ref 0.0–0.7)
Eosinophils Relative: 2 %
HCT: 34.8 % — ABNORMAL LOW (ref 39.0–52.0)
Hemoglobin: 11 g/dL — ABNORMAL LOW (ref 13.0–17.0)
Lymphocytes Relative: 20 %
Lymphs Abs: 1.6 10*3/uL (ref 0.7–4.0)
MCH: 25.5 pg — ABNORMAL LOW (ref 26.0–34.0)
MCHC: 31.6 g/dL (ref 30.0–36.0)
MCV: 80.7 fL (ref 78.0–100.0)
Monocytes Absolute: 0.6 10*3/uL (ref 0.1–1.0)
Monocytes Relative: 7 %
Neutro Abs: 5.8 10*3/uL (ref 1.7–7.7)
Neutrophils Relative %: 71 %
Platelets: 234 10*3/uL (ref 150–400)
RBC: 4.31 MIL/uL (ref 4.22–5.81)
RDW: 18.4 % — ABNORMAL HIGH (ref 11.5–15.5)
WBC: 8.2 10*3/uL (ref 4.0–10.5)

## 2017-03-29 LAB — COMPREHENSIVE METABOLIC PANEL
ALT: 13 U/L — ABNORMAL LOW (ref 17–63)
AST: 19 U/L (ref 15–41)
Albumin: 3.1 g/dL — ABNORMAL LOW (ref 3.5–5.0)
Alkaline Phosphatase: 60 U/L (ref 38–126)
Anion gap: 4 — ABNORMAL LOW (ref 5–15)
BUN: 13 mg/dL (ref 6–20)
CO2: 26 mmol/L (ref 22–32)
Calcium: 8.7 mg/dL — ABNORMAL LOW (ref 8.9–10.3)
Chloride: 105 mmol/L (ref 101–111)
Creatinine, Ser: 0.91 mg/dL (ref 0.61–1.24)
GFR calc Af Amer: >60 mL/min (ref >60)
GFR calc non Af Amer: >60 mL/min (ref >60)
Glucose, Bld: 90 mg/dL (ref 65–99)
Potassium: 4.5 mmol/L (ref 3.5–5.1)
Sodium: 135 mmol/L (ref 135–145)
Total Bilirubin: 0.7 mg/dL (ref 0.3–1.2)
Total Protein: 7.2 g/dL (ref 6.5–8.1)

## 2017-03-29 LAB — PROTIME-INR
INR: 1.1 (ref 0.8–1.2)
Prothrombin Time: 11.4 s (ref 9.1–12.0)

## 2017-03-29 LAB — BASIC METABOLIC PANEL
BUN/Creatinine Ratio: 19 (ref 10–24)
BUN: 17 mg/dL (ref 8–27)
CO2: 26 mmol/L (ref 20–29)
Calcium: 9.3 mg/dL (ref 8.6–10.2)
Chloride: 101 mmol/L (ref 96–106)
Creatinine, Ser: 0.9 mg/dL (ref 0.76–1.27)
GFR calc Af Amer: 106 mL/min/{1.73_m2} (ref >59)
GFR calc non Af Amer: 92 mL/min/{1.73_m2} (ref >59)
Glucose: 101 mg/dL — ABNORMAL HIGH (ref 65–99)
Potassium: 5 mmol/L (ref 3.5–5.2)
Sodium: 138 mmol/L (ref 134–144)

## 2017-03-29 LAB — SEDIMENTATION RATE: Sed Rate: 60 mm/hr — ABNORMAL HIGH (ref 0–16)

## 2017-03-29 LAB — TSH: TSH: 3.911 u[IU]/mL (ref 0.350–4.500)

## 2017-03-29 SURGERY — ECHOCARDIOGRAM, TRANSESOPHAGEAL
Anesthesia: Moderate Sedation

## 2017-03-29 MED ORDER — SODIUM CHLORIDE 0.9% FLUSH
3.0000 mL | Freq: Two times a day (BID) | INTRAVENOUS | Status: DC
  Administered 2017-03-29 – 2017-04-03 (×8): 3 mL via INTRAVENOUS

## 2017-03-29 MED ORDER — ASPIRIN 81 MG PO CHEW
81.0000 mg | CHEWABLE_TABLET | Freq: Every day | ORAL | Status: DC
  Administered 2017-03-29 – 2017-04-03 (×6): 81 mg via ORAL
  Filled 2017-03-29 (×6): qty 1

## 2017-03-29 MED ORDER — SODIUM CHLORIDE 0.9% FLUSH
3.0000 mL | Freq: Two times a day (BID) | INTRAVENOUS | Status: DC
  Administered 2017-03-29 – 2017-04-01 (×5): 3 mL via INTRAVENOUS

## 2017-03-29 MED ORDER — SODIUM CHLORIDE 0.9 % IV SOLN: INTRAVENOUS | Status: DC

## 2017-03-29 MED ORDER — SODIUM CHLORIDE 0.9% FLUSH: 3.0000 mL | INTRAVENOUS | Status: DC | PRN

## 2017-03-29 MED ORDER — ASPIRIN 81 MG PO CHEW: 81.0000 mg | CHEWABLE_TABLET | ORAL | Status: AC

## 2017-03-29 MED ORDER — SODIUM CHLORIDE 0.9 % WEIGHT BASED INFUSION: 3.0000 mL/kg/h | INTRAVENOUS | Status: AC

## 2017-03-29 MED ORDER — SODIUM CHLORIDE 0.9 % IV SOLN: 250.0000 mL | INTRAVENOUS | Status: DC | PRN

## 2017-03-29 MED ORDER — SODIUM CHLORIDE 0.9 % IV SOLN
3.0000 g | Freq: Four times a day (QID) | INTRAVENOUS | Status: DC
  Administered 2017-03-29 – 2017-03-31 (×7): 3 g via INTRAVENOUS
  Filled 2017-03-29 (×8): qty 3

## 2017-03-29 MED ORDER — HEPARIN SODIUM (PORCINE) 5000 UNIT/ML IJ SOLN
5000.0000 [IU] | Freq: Three times a day (TID) | INTRAMUSCULAR | Status: DC
  Administered 2017-03-29 – 2017-04-03 (×14): 5000 [IU] via SUBCUTANEOUS
  Filled 2017-03-29 (×14): qty 1

## 2017-03-29 MED ORDER — VANCOMYCIN HCL IN DEXTROSE 1-5 GM/200ML-% IV SOLN
1000.0000 mg | Freq: Three times a day (TID) | INTRAVENOUS | Status: DC
  Administered 2017-03-29 – 2017-03-31 (×5): 1000 mg via INTRAVENOUS
  Filled 2017-03-29 (×6): qty 200

## 2017-03-29 MED ORDER — VANCOMYCIN HCL 10 G IV SOLR
2000.0000 mg | Freq: Once | INTRAVENOUS | Status: DC
  Filled 2017-03-29: qty 2000

## 2017-03-29 MED ORDER — ACETAMINOPHEN 500 MG PO TABS: 1000.0000 mg | ORAL_TABLET | Freq: Four times a day (QID) | ORAL | Status: DC | PRN

## 2017-03-29 MED ORDER — BUTAMBEN-TETRACAINE-BENZOCAINE 2-2-14 % EX AERO
INHALATION_SPRAY | CUTANEOUS | Status: DC | PRN
  Administered 2017-03-29: 2 via TOPICAL

## 2017-03-29 MED ORDER — ATORVASTATIN CALCIUM 10 MG PO TABS
10.0000 mg | ORAL_TABLET | Freq: Every day | ORAL | Status: DC
  Administered 2017-03-29 – 2017-04-02 (×5): 10 mg via ORAL
  Filled 2017-03-29 (×5): qty 1

## 2017-03-29 MED ORDER — MIDAZOLAM HCL 10 MG/2ML IJ SOLN
INTRAMUSCULAR | Status: DC | PRN
Start: 2017-03-29 — End: 2017-03-29
  Administered 2017-03-29 (×2): 2 mg via INTRAVENOUS
  Administered 2017-03-29: 1 mg via INTRAVENOUS

## 2017-03-29 MED ORDER — SODIUM CHLORIDE 0.9 % WEIGHT BASED INFUSION
1.0000 mL/kg/h | INTRAVENOUS | Status: DC
  Administered 2017-03-29: 1 mL/kg/h via INTRAVENOUS

## 2017-03-29 MED ORDER — MIDAZOLAM HCL 5 MG/ML IJ SOLN
INTRAMUSCULAR | Status: AC
  Filled 2017-03-29: qty 2

## 2017-03-29 MED ORDER — DIPHENHYDRAMINE HCL 50 MG/ML IJ SOLN
INTRAMUSCULAR | Status: AC
  Filled 2017-03-29: qty 1

## 2017-03-29 MED ORDER — FERROUS SULFATE 325 (65 FE) MG PO TABS
325.0000 mg | ORAL_TABLET | Freq: Every day | ORAL | Status: DC
  Administered 2017-03-30 – 2017-04-03 (×5): 325 mg via ORAL
  Filled 2017-03-29 (×7): qty 1

## 2017-03-29 MED ORDER — FENTANYL CITRATE (PF) 100 MCG/2ML IJ SOLN
INTRAMUSCULAR | Status: DC | PRN
  Administered 2017-03-29 (×2): 25 ug via INTRAVENOUS

## 2017-03-29 MED ORDER — FENTANYL CITRATE (PF) 100 MCG/2ML IJ SOLN
INTRAMUSCULAR | Status: AC
  Filled 2017-03-29: qty 2

## 2017-03-29 MED ORDER — LOSARTAN POTASSIUM 50 MG PO TABS
50.0000 mg | ORAL_TABLET | Freq: Every day | ORAL | Status: DC
  Administered 2017-03-30 – 2017-04-03 (×5): 50 mg via ORAL
  Filled 2017-03-29 (×5): qty 1

## 2017-03-29 MED ORDER — SODIUM CHLORIDE 0.9 % IV SOLN
INTRAVENOUS | Status: DC
  Administered 2017-03-29: 10:00:00 via INTRAVENOUS

## 2017-03-29 MED ORDER — VITAMIN C 500 MG PO TABS
1000.0000 mg | ORAL_TABLET | Freq: Every day | ORAL | Status: DC
  Administered 2017-03-30 – 2017-04-03 (×5): 1000 mg via ORAL
  Filled 2017-03-29 (×5): qty 2

## 2017-03-29 MED ORDER — ENSURE ENLIVE PO LIQD
237.0000 mL | Freq: Two times a day (BID) | ORAL | Status: DC
  Administered 2017-03-30 – 2017-04-03 (×8): 237 mL via ORAL

## 2017-03-29 MED ORDER — SODIUM CHLORIDE 0.9 % IV SOLN
250.0000 mL | INTRAVENOUS | Status: DC | PRN
  Administered 2017-03-29: 250 mL via INTRAVENOUS

## 2017-03-29 NOTE — Interval H&P Note (Signed)
History and Physical Interval Note:  03/29/2017 10:04 AM  [name] Anderson  has presented today for surgery, with the diagnosis of AORTIC VALVE DISEASE  The various methods of treatment have been discussed with the patient and family. After consideration of risks, benefits and other options for treatment, the patient has consented to  Procedure(s): TRANSESOPHAGEAL ECHOCARDIOGRAM (TEE) (N/A) as a surgical intervention .  The patient's history has been reviewed, patient examined, no change in status, stable for surgery.  I have reviewed the patient's chart and labs.  Questions were answered to the patient's satisfaction.     Kirk Ruths

## 2017-03-29 NOTE — Progress Notes (Signed)
  Echocardiogram Echocardiogram Transesophageal has been performed.  Jannett Celestine 03/29/2017, 12:05 PM

## 2017-03-29 NOTE — H&P (Signed)
Office Visit   03/28/2017 [facility] Wendy Poet, MD  Cardiology   AVD (aortic valve disease) +4 more  Dx   Follow-up   ; Referred by Antony Contras, MD  Reason for Visit   Additional Documentation   Vitals:   BP 118/60   Pulse 84   Ht 5\' 11"  (1.803 m)   Wt 194 lb (88 kg)   BMI 27.06 kg/m   BSA 2.1 m      More Vitals   Flowsheets:   Anthropometrics,   MEWS Score     Encounter Info:   Billing Info,   History,   Allergies,   Detailed Report     All Notes   Progress Notes by Lelon Perla, MD at 03/28/2017 8:00 AM   Author: Lelon Perla, MD Author Type: Physician Filed: 03/28/2017 8:54 AM  Note Status: Signed Cosign: Cosign Not Required Encounter Date: 03/28/2017  Editor: Lelon Perla, MD (Physician)         HPI: FU aortic insufficiency.Echocardiogram repeated December 2017 and showed normal LV function, grade 1 diastolic dysfunction, mild to moderate aortic insufficiency, dilated aorta, mild mitral regurgitation. CTA November 2017 showed stable aortic root dilatation at 4.7 cm. Had syncope 8/18. Monitor in September 2018 showed no significant arrhythmia. Carotid Dopplers October 2018 showed no significant stenosis. Brain MRA November 2018 normal. Echocardiogram repeated November 2018. Ejection fraction 50-55%, severe left ventricular enlargement, grade 1 diastolic dysfunction, severe aortic insufficiency, dilated aortic root at 46 mm, mild mitral regurgitation and mild left atrial enlargement. CTA of thoracic aorta repeated November 2018. The aortic root measured 4.7 cm. There were wedge-shaped lesions in the spleen question splenic infarcts. Since last seen,patient denies dyspnea, chest pain or palpitations. He occasionally has some dizziness but has had no recurrent syncope. He has had transient double vision but previous MRI negative.        Current Outpatient Medications  Medication Sig Dispense Refill  . acetaminophen  (TYLENOL) 500 MG tablet Take 500 mg by mouth every 6 (six) hours as needed.    Marland Kitchen amoxicillin (AMOXIL) 500 MG capsule TAKE 4 TABLETS BY MOUTH 1 HOUR PRIOR TO DENTAL APPOINTMENT 4 capsule 1  . Ascorbic Acid (VITAMIN C) 1000 MG tablet Take 1,000 mg by mouth daily as needed. DURING WINTER    . aspirin 81 MG tablet Take 81 mg by mouth daily.    . Ferrous Sulfate (IRON) 325 (65 Fe) MG TABS Take 1 tablet by mouth daily.    Marland Kitchen glucosamine-chondroitin 500-400 MG tablet Take 1 tablet by mouth 3 (three) times daily.    Marland Kitchen HYPERCARE 20 % external solution Apply 1 application topically at bedtime as needed.  10  . Multiple Vitamin (MULTIVITAMIN) tablet Take 1 tablet by mouth daily.    . Multiple Vitamins-Minerals (ECHINACEA ACZ) CAPS Take 2 capsules by mouth daily as needed. DURING WINTER--    . sildenafil (REVATIO) 20 MG tablet Take 20 mg by mouth daily as needed.     . triamcinolone cream (KENALOG) 0.1 % Apply 1 application topically daily as needed. Apply to area after bath (DO NOT APPLY TO FACE)  1  . losartan (COZAAR) 50 MG tablet Take 1 tablet (50 mg total) by mouth daily. 90 tablet 3   No current facility-administered medications for this visit.          Past Medical History:  Diagnosis Date  . Allergy   . Anemia   . Barrett's esophagus -  short segment 03/13/2017  . Cancer ([place]) 09/11/13   testicular  . ED (erectile dysfunction)   . H/O aneurysm 03/05/2017  . High cholesterol   . History of basal cell carcinoma excision    02/2002   --  NOSE  &   2013  RIGHT EAR  . History of kidney stones   . Hyperlipidemia   . Mild obstructive sleep apnea    PER STUDY 01-18-2005--  NO CPAP OR MOUTH GUARD  . Moderate aortic valve insufficiency   . 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 Personal history of colonic polyps    TUBULAR ADENOMA--   . Seasonal allergies   . 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[place])   . Testicular mass    RIGHT          Past Surgical History:  Procedure Laterality Date  . COLONOSCOPY  01/24/06, 07/10/11  . EXTRACORPOREAL SHOCK WAVE LITHOTRIPSY    . MOHS SURGERY  02/2002  &  2013   TIP OF NOSE-///     RIGHT EAR  . ORCHIECTOMY Right 09/11/2013   Procedure:  RIGHT ORCHIECTOMY;  Surgeon: Jorja Loa, MD;  Location: [facility];  Service: Urology;  Laterality: Right;  . SCROTAL EXPLORATION Right 09/11/2013   Procedure: RIGHT INGUINAL EXPLORATION;  Surgeon: Jorja Loa, MD;  Location: [facility];  Service: Urology;  Laterality: Right;  . TRANSTHORACIC ECHOCARDIOGRAM  02-25-2013   DR CRENSHAW   NORMAL LVSF/  EF 55-60%/   GRADE 2 DIASTOLIC DYSFUNCTION/  ECCENTRIC MODERATE AORTIC INSUFFICIENCY WITHOUT STENOSIS/  MILD DILATED AORTIC ROOT/ TRIVIAL MR, TR,  & PR  . UMBILICAL HERNIA REPAIR  06-08-2009  . URETEROSCOPIC LASER LITHOTRIPSY STONE EXTRACTION  2007    Social History        Socioeconomic History  . Marital status: Married    Spouse name: Coralyn Mark  . Number of children: 2  . Years of education: 26  . Highest education level: Not on file  Social Needs  . Financial resource strain: Not on file  . Food insecurity - worry: Not on file  . Food insecurity - inability: Not on file  . Transportation needs - medical: Not on file  . Transportation needs - non-medical: Not on file  Occupational History  . 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 Smoking status: Former Smoker    Types: Pipe  . Smokeless tobacco: Former Systems developer    Types: [place] date: 04/26/2011  . Tobacco comment: OCCASIONAL  PIPE SMOKER  (NEVER SMOKED CIGARETTES)  Substance and Sexual Activity  . Alcohol use: Yes    Comment: Occasional  . Drug use: No  . Sexual activity: Not on file  Other Topics Concern  . Not on file  Social History Narrative   Lives with wife Coralyn Mark   Caffeine use: coffee- 1 cup per day         Family History  Problem Relation Age of  Onset  . Colon cancer Father   . Heart disease Paternal Grandmother     ROS: occasional dizziness but no fevers or chills, productive cough, hemoptysis, dysphasia, odynophagia, melena, hematochezia, dysuria, hematuria, rash, seizure activity, orthopnea, PND, pedal edema, claudication. Remaining systems are negative.  Physical Exam: Well-developed well-nourished in no acute distress.  Skin is warm and dry.  HEENT is normal.  Neck is supple.  Chest is clear to auscultation with normal expansion.  Cardiovascular exam is regular rate  and rhythm. 2/6 DM LSB Abdominal exam nontender or distended. No masses palpated. Extremities show no edema. neuro grossly intact   A/P  1 aortic insufficiency-most recent echocardiogram shows aortic insufficiency has worsened and is now severe. Left ventricular function is reduced compared to previous and there is LVE (LVESD 46 mm). He is not having symptoms but based on decreasing LV function would favor aortic valve replacement. He will need cardiac catheterization prior to procedure. The risks and benefits including myocardial infarction, CVA and death discussed and he agrees to proceed. Will arrange evaluation with cardiothoracic surgery.   2 thoracic aortic aneurysm-patient will need aortic root replacement at time of aortic valve surgery.  3 hypertension-blood pressure is controlled. Continue present medications.  4 hyperlipidemia-continue statin.  5 ? Splenic infarcts-noted on CT scan. There is question of lymphoma based on radiology report. Will arrange hematology evaluation. Given question infarcts I will also arrange a transesophageal echocardiogram to better assess aortic insufficiency and rule outsource of embolus including LAA thrombus. No H/O atrial fibrillation. Doubt splenic infarcts given no history of abdominal pain. However he has also had transient double vision. Question TIAs. Note patient had recent EGD for anemia and there was  possible Barrett's esophagus and moderate inflammation in the gastric antrum. Colonoscopy showed diverticula.  Kirk Ruths, MD      For TEE; no changes Kirk Ruths

## 2017-03-29 NOTE — Consult Note (Signed)
[address]       [place] [address]             [phone]        [name] [facility] [place] Medical Record #[id number] Date of Birth: 06/08/1955  Referring: Dr Stanford Breed  Primary Care: Antony Contras, MD  Chief Complaint:   Routine follow-up for aortic insufficiency, noted to have significantly more aortic insufficiency, likely endocarditis   History of Present Illness:     Patient is a 60 year old male with known previous cardiac history involving known aortic insufficiency and mildly dilated ascending aorta.  The patient has been followed for several years with serial echocardiograms and CTA of the chest.  Most recent echo suggested more aortic insufficiency.  Repeat CTA of the chest suggested possible splenic infarcts.  TEE was performed because of these findings and set adjusted possible endocarditis, with increased aortic insufficiency and possible vegetation.  Although the patient did not come in because of symptoms, in retrospect he has felt more fatigued over the past several months, but denies any specific infections, denies fever and chills, has had no dental recent dental work, he has been noted to be anemic and recent upper GI endoscopy and colonoscopy were performed without specific causes of the anemia.   Following the TEE the patient was admitted to 3 blood cultures were obtained and this morning are now growing staph species, not staph aureus and methicillin sensitive.   Other than fatigue the patient has no significant symptoms of congestive heart failure, he was with a MRI of the brain in early November for 90-monthhistory of dizziness.  MRI was negative for stroke March 12, 2017   Current Activity/ Functional Status: Patient is independent with mobility/ambulation, transfers, ADL's, IADL's.   Zubrod Score: At the time of surgery this patient's most appropriate activity status/level should be described as: _0     0    Normal activity, no  symptoms _1     1    Restricted in physical strenuous activity but ambulatory, able to do out light work _2     2    Ambulatory and capable of self care, unable to do work activities, up and about                 more than 50%  Of the time                            _3     3    Only limited self care, in bed greater than 50% of waking hours _4     4    Completely disabled, no self care, confined to bed or chair _5     5    Moribund  Past Medical History:  Diagnosis Date  . Allergy   . Anemia   . Barrett's esophagus - short segment 03/13/2017  . Cancer ([facility] 09/11/13   testicular  . ED (erectile dysfunction)   . Endocarditis    /[facility]11/30/2018  . H/O aneurysm 03/05/2017  . High cholesterol   . History of basal cell carcinoma excision    02/2002   --  NOSE  &   2013  RIGHT EAR  . History of kidney stones   . Hyperlipidemia   . Mild obstructive sleep apnea    PER STUDY 01-18-2005--  NO CPAP OR MOUTH GUARD  . Moderate aortic valve insufficiency   . Multiple pulmonary nodules  PER CT--  PROBABLE  GRANULOMATOUS  . Personal history of colonic polyps    TUBULAR ADENOMA--   . Seasonal allergies   . 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[place])   . Testicular mass    RIGHT    Past Surgical History:  Procedure Laterality Date  . COLONOSCOPY  01/24/06, 07/10/11  . EXTRACORPOREAL SHOCK WAVE LITHOTRIPSY    . HERNIA REPAIR    . MOHS SURGERY  02/2002  &  2013   TIP OF NOSE-///     RIGHT EAR  . ORCHIECTOMY Right 09/11/2013   Procedure:  RIGHT ORCHIECTOMY;  Surgeon: Jorja Loa, MD;  Location: [facility];  Service: Urology;  Laterality: Right;  . SCROTAL EXPLORATION Right 09/11/2013   Procedure: RIGHT INGUINAL EXPLORATION;  Surgeon: Jorja Loa, MD;  Location[facility];  Service: Urology;  Laterality: Right;  . TEE WITHOUT CARDIOVERSION N/A 03/29/2017   Procedure: TRANSESOPHAGEAL ECHOCARDIOGRAM (TEE);  Surgeon: Lelon Perla, MD;  Location: [facility] ENDOSCOPY;   Service: Cardiovascular;  Laterality: N/A;  . TRANSTHORACIC ECHOCARDIOGRAM  02-25-2013   DR CRENSHAW   NORMAL LVSF/  EF 55-60%/   GRADE 2 DIASTOLIC DYSFUNCTION/  ECCENTRIC MODERATE AORTIC INSUFFICIENCY WITHOUT STENOSIS/  MILD DILATED AORTIC ROOT/ TRIVIAL MR, TR,  & PR  . UMBILICAL HERNIA REPAIR  06-08-2009  . URETEROSCOPIC LASER LITHOTRIPSY STONE EXTRACTION  2007    Social History   Tobacco Use  Smoking Status Current Some Day Smoker  . Types: Pipe  Smokeless Tobacco Former Systems developer  . Types: Chew  . Quit date: 04/26/2011  Tobacco Comment   OCCASIONAL  PIPE SMOKER  (NEVER SMOKED CIGARETTES)    Social History   Substance and Sexual Activity  Alcohol Use Yes   Comment: 03/29/2017 "might have a glass of wine twice/year"    Social History   Socioeconomic History  . Marital status: Married    Spouse name: Coralyn Mark  . Number of children: 2  . Years of education: 66  . Highest education level: Not on file  Social Needs  . Financial resource strain: Not on file  . Food insecurity - worry: Not on file  . Food insecurity - inability: Not on file  . Transportation needs - medical: Not on file  . Transportation needs - non-medical: Not on file  Occupational History  . 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 Smoking status: Current Some Day Smoker    Types: Pipe  . Smokeless tobacco: Former Systems developer    Types: [place] date: 04/26/2011  . Tobacco comment: OCCASIONAL  PIPE SMOKER  (NEVER SMOKED CIGARETTES)  Substance and Sexual Activity  . Alcohol use: Yes    Comment: 03/29/2017 "might have a glass of wine twice/year"  . Drug use: No  . Sexual activity: Yes  Other Topics Concern  . Not on file  Social History Narrative   Lives with wife Coralyn Mark   Caffeine use: coffee- 1 cup per day    No Known Allergies  Current Facility-Administered Medications  Medication Dose Route Frequency Provider Last Rate Last Dose  . 0.9 %  sodium chloride infusion  250 mL Intravenous PRN  Daune Perch, NP      . 0.9 %  sodium chloride infusion  250 mL Intravenous PRN Lelon Perla, MD 10 mL/hr at 03/29/17 2121 250 mL at 03/29/17 2121  . 0.9% sodium chloride infusion  1 mL/kg/hr Intravenous Continuous Lelon Perla, MD 88 mL/hr at 03/29/17 2122 1 mL/kg/hr at  03/29/17 2122  . acetaminophen (TYLENOL) tablet 1,000 mg  1,000 mg Oral Q6H PRN Daune Perch, NP      . aspirin chewable tablet 81 mg  81 mg Oral Daily Daune Perch, NP   81 mg at 03/31/17 0941  . atorvastatin (LIPITOR) tablet 10 mg  10 mg Oral q1800 Daune Perch, NP   10 mg at 03/30/17 1719  . ceFAZolin (ANCEF) IVPB 2g/100 mL premix  2 g Intravenous Q8H Thayer Headings, MD 200 mL/hr at 03/31/17 1349 2 g at 03/31/17 1349  . feeding supplement (ENSURE ENLIVE) (ENSURE ENLIVE) liquid 237 mL  237 mL Oral BID BM Lelon Perla, MD   237 mL at 03/30/17 1308  . ferrous sulfate tablet 325 mg  325 mg Oral Daily Daune Perch, NP   325 mg at 03/31/17 0941  . heparin injection 5,000 Units  5,000 Units Subcutaneous Q8H Daune Perch, NP   5,000 Units at 03/31/17 0556  . losartan (COZAAR) tablet 50 mg  50 mg Oral Daily Daune Perch, NP   50 mg at 03/31/17 2440  . sodium chloride flush (NS) 0.9 % injection 3 mL  3 mL Intravenous Q12H Daune Perch, NP   3 mL at 03/31/17 0946  . sodium chloride flush (NS) 0.9 % injection 3 mL  3 mL Intravenous PRN Daune Perch, NP      . sodium chloride flush (NS) 0.9 % injection 3 mL  3 mL Intravenous Q12H Lelon Perla, MD   3 mL at 03/31/17 0942  . sodium chloride flush (NS) 0.9 % injection 3 mL  3 mL Intravenous PRN Lelon Perla, MD      . vitamin C (ASCORBIC ACID) tablet 1,000 mg  1,000 mg Oral Daily Daune Perch, NP   1,000 mg at 03/31/17 0941    Medications Prior to Admission  Medication Sig Dispense Refill Last Dose  . acetaminophen (TYLENOL) 500 MG tablet Take 1,000 mg by mouth every 6 (six) hours as needed (for pain.).    03/28/2017 at Unknown time  .  aluminum chloride (HYPERCARE) 20 % external solution Apply 1 application topically 3 (three) times daily as needed (for sweating.).   PRN  . amoxicillin (AMOXIL) 500 MG capsule TAKE 4 TABLETS BY MOUTH 1 HOUR PRIOR TO DENTAL APPOINTMENT 4 capsule 1 PRN  . Ascorbic Acid (VITAMIN C) 1000 MG tablet Take 1,000 mg by mouth daily.   03/28/2017 at Unknown time  . aspirin 81 MG chewable tablet Chew 81 mg by mouth daily.   03/28/2017 at Unknown time  . [employer] TRIPLE MAGNESIUM COMPLEX PO Take 1 tablet by mouth 2 (two) times daily.   03/28/2017 at Unknown time  . ECHINACEA PO Take 760 mg by mouth daily.   03/28/2017 at Unknown time  . Ferrous Sulfate (IRON) 325 (65 Fe) MG TABS Take 325 mg by mouth daily.    03/28/2017 at Unknown time  . Glucosamine-Chondroitin (COSAMIN DS PO) Take 1 tablet by mouth 2 (two) times daily.   03/28/2017 at Unknown time  . losartan (COZAAR) 50 MG tablet Take 1 tablet (50 mg total) by mouth daily. 90 tablet 3 03/29/2017 at 0700  . Naphazoline-Pheniramine (OPCON-A) 0.027-0.315 % SOLN Place 1-2 drops into both eyes 3 (three) times daily as needed (for allergy eyes.).   PRN  . NON FORMULARY Take 2 capsules by mouth 2 (two) times daily. DoTerra (Microplex VMz) 2 Capsule twice daily   03/28/2017 at Unknown time  . NON FORMULARY Take 2 capsules  by mouth 2 (two) times daily. XE0 Mega "Doterra"   03/28/2017 at Unknown time  . sildenafil (REVATIO) 20 MG tablet Take 20 mg by mouth daily as needed (for ED).    Taking  . SUPER B COMPLEX/C PO Take 1 tablet by mouth daily.   03/28/2017 at Unknown time  . triamcinolone cream (KENALOG) 0.1 % Apply 1 application topically daily as needed (FOR DRY SKIN/ITCHY SKIN.). Apply to area after bath (DO NOT APPLY TO FACE)  1 PRN    Family History  Problem Relation Age of Onset  . Colon cancer Father   . Heart disease Paternal Grandmother      Review of Systems:  Pertinent items are noted in HPI.     Cardiac Review of Systems: Y or N  Chest Pain [ n   ]    Resting SOB [ n  ] Exertional SOB  [n  ]  Orthopnea [ n ]   Pedal Edema [  n ]    Palpitations [ n ] Syncope  [n  ]   Presyncope [ n  ]  General Review of Systems: [Y] = yes [  ]=no Constitional: recent weight change [n  ]; anorexia [  ]; fatigue [ y ]; nausea [  ]; night sweats [  ]; fever [ n ]; or chills [n  ]                                                               Dental: poor dentition[ n ]; Last Dentist visit: 6 months ago patient has regular dental care  Eye : blurred vision [n  ]; diplopia [   ]; vision changes [n  ];  Amaurosis fugax[ n ]; Resp: cough [n  ];  wheezing[ n ];  hemoptysis[n  ]; shortness of breath[ n ]; paroxysmal nocturnal dyspnea[  ]; dyspnea on exertion[  ]; or orthopnea[  ];  GI:  gallstones[n  ], vomiting[ n ];  dysphagia[  ]; melena[n  ];  hematochezia [ n ]; heartburn[  ];   Hx of  Colonoscopy[y  ]; GU: kidney stones [  ]; hematuria[  ];   dysuria [  ];  nocturia[  ];  history of     obstruction [  ]; urinary frequency [  ]             Skin: rash, swelling[  ];, hair loss[  ];  peripheral edema[  ];  or itching[  ]; Musculosketetal: myalgias[  ];  joint swelling[  ];  joint erythema[  ];  joint pain[  ];  back pain[  ];  Heme/Lymph: bruising[  ];  bleeding[  ];  anemia[  ];  Neuro: TIA[dizzyness   ];  headaches[  ];  stroke[  ];  vertigo[  ];  seizures[  ];   paresthesias[  ];  difficulty walking[  ];  Psych:depressionn[  ]; anxiety[  n];  Endocrine: diabetes[ n ];  thyroid dysfunction[ n ];  Immunizations: Flu [n]; Pneumococcal[ n ];  Other:  Physical Exam: BP (!) 119/54 (BP Location: Right Arm)   Pulse 85   Temp 98.4 F (36.9 C) (Oral)   Resp 18   Ht _0  (1.803 m)   Wt 185 lb 1.6 oz (84  kg)   SpO2 99%   BMI 25.82 kg/m    General appearance: alert, cooperative, appears stated age and no distress Head: Normocephalic, without obvious abnormality, atraumatic Neck: no adenopathy, no carotid bruit, no JVD, supple, symmetrical, trachea midline and  thyroid not enlarged, symmetric, no tenderness/mass/nodules Lymph nodes: Cervical, supraclavicular, and axillary nodes normal. Resp: clear to auscultation bilaterally Back: symmetric, no curvature. ROM normal. No CVA tenderness. Cardio: diastolic murmur: holodiastolic 3/6, crescendo at lower left sternal border GI: soft, non-tender; bowel sounds normal; no masses,  no organomegaly Extremities: extremities normal, atraumatic, no cyanosis or edema, Homans sign is negative, no sign of DVT and No splinter hemorrhages or other signs of distal emboli in the hands or feet Neurologic: Grossly normal  Diagnostic Studies & Laboratory data:    Recent Lab Findings: Lab Results  Component Value Date   WBC 8.9 03/30/2017   HGB 10.6 (L) 03/30/2017   HCT 33.3 (L) 03/30/2017   PLT 224 03/30/2017   GLUCOSE 100 (H) 03/30/2017   CHOL 165 03/30/2017   TRIG 91 03/30/2017   HDL 24 (L) 03/30/2017   LDLDIRECT 160.9 02/12/2013   LDLCALC 123 (H) 03/30/2017   ALT 13 (L) 03/29/2017   AST 19 03/29/2017   NA 133 (L) 03/30/2017   K 4.2 03/30/2017   CL 103 03/30/2017   CREATININE 1.00 03/30/2017   BUN 15 03/30/2017   CO2 25 03/30/2017   TSH 3.911 03/29/2017   INR 1.1 03/28/2017  Micro:  Results for orders placed or performed during the hospital encounter of 03/29/17  Culture, blood (routine x 2)     Status: None (Preliminary result)   Collection Time: 03/29/17 12:30 PM  Result Value Ref Range Status   Specimen Description BLOOD RIGHT ANTECUBITAL  Final   Special Requests   Final    BOTTLES DRAWN AEROBIC ONLY Blood Culture adequate volume   Culture  Setup Time   Final    GRAM POSITIVE COCCI IN CLUSTERS AEROBIC BOTTLE ONLY CRITICAL VALUE NOTED.  VALUE IS CONSISTENT WITH PREVIOUSLY REPORTED AND CALLED VALUE.    Culture GRAM POSITIVE COCCI  Final   Report Status PENDING  Incomplete  Culture, blood (routine x 2)     Status: None (Preliminary result)   Collection Time: 03/29/17 12:32 PM  Result Value  Ref Range Status   Specimen Description BLOOD LEFT HAND  Final   Special Requests   Final    BOTTLES DRAWN AEROBIC ONLY Blood Culture adequate volume   Culture  Setup Time   Final    GRAM POSITIVE COCCI IN CLUSTERS AEROBIC BOTTLE ONLY CRITICAL RESULT CALLED TO, READ BACK BY AND VERIFIED WITH: A. JOHNSTON,PHARMD 2056 03/30/2017 T. TYSOR    Culture GRAM POSITIVE COCCI  Final   Report Status PENDING  Incomplete  Blood Culture ID Panel (Reflexed)     Status: Abnormal   Collection Time: 03/29/17 12:32 PM  Result Value Ref Range Status   Enterococcus species NOT DETECTED NOT DETECTED Final   Listeria monocytogenes NOT DETECTED NOT DETECTED Final   Staphylococcus species DETECTED (A) NOT DETECTED Final    Comment: Methicillin (oxacillin) susceptible coagulase negative staphylococcus. Possible blood culture contaminant (unless isolated from more than one blood culture draw or clinical case suggests pathogenicity). No antibiotic treatment is indicated for blood  culture contaminants. CRITICAL RESULT CALLED TO, READ BACK BY AND VERIFIED WITH: A. JOHNSTON,PHARMD 2056 03/30/2017 T. TYSOR    Staphylococcus aureus NOT DETECTED NOT DETECTED Final   Methicillin resistance NOT DETECTED NOT  DETECTED Final   Streptococcus species NOT DETECTED NOT DETECTED Final   Streptococcus agalactiae NOT DETECTED NOT DETECTED Final   Streptococcus pneumoniae NOT DETECTED NOT DETECTED Final   Streptococcus pyogenes NOT DETECTED NOT DETECTED Final   Acinetobacter baumannii NOT DETECTED NOT DETECTED Final   Enterobacteriaceae species NOT DETECTED NOT DETECTED Final   Enterobacter cloacae complex NOT DETECTED NOT DETECTED Final   Escherichia coli NOT DETECTED NOT DETECTED Final   Klebsiella oxytoca NOT DETECTED NOT DETECTED Final   Klebsiella pneumoniae NOT DETECTED NOT DETECTED Final   Proteus species NOT DETECTED NOT DETECTED Final   Serratia marcescens NOT DETECTED NOT DETECTED Final   Haemophilus influenzae NOT  DETECTED NOT DETECTED Final   Neisseria meningitidis NOT DETECTED NOT DETECTED Final   Pseudomonas aeruginosa NOT DETECTED NOT DETECTED Final   Candida albicans NOT DETECTED NOT DETECTED Final   Candida glabrata NOT DETECTED NOT DETECTED Final   Candida krusei NOT DETECTED NOT DETECTED Final   Candida parapsilosis NOT DETECTED NOT DETECTED Final   Candida tropicalis NOT DETECTED NOT DETECTED Final  Culture, blood (routine x 2)     Status: None (Preliminary result)   Collection Time: 03/29/17 12:35 PM  Result Value Ref Range Status   Specimen Description BLOOD RIGHT HAND  Final   Special Requests   Final    BOTTLES DRAWN AEROBIC ONLY Blood Culture adequate volume   Culture  Setup Time   Final    GRAM POSITIVE COCCI IN CLUSTERS AEROBIC BOTTLE ONLY CRITICAL VALUE NOTED.  VALUE IS CONSISTENT WITH PREVIOUSLY REPORTED AND CALLED VALUE.    Culture CULTURE REINCUBATED FOR BETTER GROWTH  Final   Report Status PENDING  Incomplete  Culture, blood (routine x 2)     Status: None (Preliminary result)   Collection Time: 03/29/17  9:17 PM  Result Value Ref Range Status   Specimen Description BLOOD LEFT ANTECUBITAL  Final   Special Requests Blood Culture adequate volume IN PEDIATRIC BOTTLE  Final   Culture  Setup Time   Final    GRAM POSITIVE COCCI IN PEDIATRIC BOTTLE CRITICAL VALUE NOTED.  VALUE IS CONSISTENT WITH PREVIOUSLY REPORTED AND CALLED VALUE.    Culture GRAM POSITIVE COCCI  Final   Report Status PENDING  Incomplete  Culture, blood (routine x 2)     Status: None (Preliminary result)   Collection Time: 03/29/17  9:21 PM  Result Value Ref Range Status   Specimen Description BLOOD LEFT HAND  Final   Special Requests Blood Culture adequate volume IN PEDIATRIC BOTTLE  Final   Culture  Setup Time   Final    GRAM POSITIVE COCCI IN CLUSTERS IN PEDIATRIC BOTTLE CRITICAL VALUE NOTED.  VALUE IS CONSISTENT WITH PREVIOUSLY REPORTED AND CALLED VALUE.    Culture NO GROWTH < 24 HOURS  Final    Report Status PENDING  Incomplete   Recent Radiology Findings:   Mr Virgel Paling LS Contrast  Result Date: 03/12/2017 CLINICAL DATA:  Dizziness for 3 months.  Hypertension. EXAM: MRA HEAD WITHOUT CONTRAST TECHNIQUE: Angiographic images of the Circle of Willis were obtained using MRA technique without intravenous contrast. COMPARISON:  None. FINDINGS: Anterior circulation: Slightly smaller right ICA in the setting of relative right A1 hypoplasia. Robust anterior communicating artery. Vessels are smooth and widely patent. Negative for aneurysm. Posterior circulation: Left dominant vertebral artery. There is symmetric loss of signal in the proximal V4 segments and picas due to partial horizontal course. There is antegrade flow in both vertebral arteries on recent carotid  Doppler. The vessels are smooth and widely patent. A small left posterior communicating artery is present. Negative for aneurysm. IMPRESSION: Normal intracranial MRA. Electronically Signed   By: Monte Fantasia M.D.   On: 03/12/2017 09:00   Ct Angio Chest Aorta W &/or Wo Contrast  Result Date: 03/19/2017 CLINICAL DATA:  62 year old male with a history of thoracic aortic aneurysm EXAM: CT ANGIOGRAPHY CHEST WITH CONTRAST TECHNIQUE: Multidetector CT imaging of the chest was performed using the standard protocol during bolus administration of intravenous contrast. Multiplanar CT image reconstructions and MIPs were obtained to evaluate the vascular anatomy. CONTRAST:  124m ISOVUE-370 IOPAMIDOL (ISOVUE-370) INJECTION 76% COMPARISON:  Prior CTA of the chest 03/29/2016 FINDINGS: Cardiovascular: Stable mild aneurysmal dilatation of the aortic root. Precise measurements are challenging giving artifact related to cardiac motion, however the maximal diameter does not exceed 4.7 cm as previously measured. In fact, I suspect the true measurement to be closer to 4.4 cm. The tubular portion of the ascending thoracic aorta remains stable at 3.8 cm. The  transverse and descending thoracic aorta are normal in caliber. No significant aortic plaque. Mild left ventricular dilatation. No thrombus visualized within the cardiac chambers or left atrial appendage. Mediastinum/Nodes: Unremarkable CT appearance of the thyroid gland. No suspicious mediastinal or hilar adenopathy. No soft tissue mediastinal mass. The thoracic esophagus is unremarkable. Lungs/Pleura: Lungs are clear. No pleural effusion or pneumothorax. Upper Abdomen: Interval development of multiple wedge-shaped regions of low attenuation in the superior aspect of the spleen as well as an approximately 6.6 x 5.2 cm low-attenuation region in the inferior aspect of the spleen. These findings are new compared to prior imaging. Musculoskeletal: No chest wall abnormality. No acute or significant osseous findings. Review of the MIP images confirms the above findings. IMPRESSION: 1. Stable mild aneurysmal dilatation of the aortic root which is no larger than 4.7 cm. I suspect the 4.7 cm measurement may be slightly exaggerated by cardiac motion artifact which is been present across multiple prior studies. The true measurement of the aortic root is likely closer to 4.4 cm. Consider cardiac gated CTA of the next annual follow-up evaluation. 2. Interval development of multiple wedge-shaped regions of low attenuation in the superior aspect of the spleen as well as a new 6.6 cm low-attenuation region in the inferior aspect of the spleen. 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 There is no significant or irregular atherosclerotic plaque in the thoracic, or upper abdominal aorta to serve as a donor site for emboli. Does the patient have a clinical history of atrial fibrillation? Has the patient experienced episodes of left upper quadrant pain over the past several weeks or months? 3. Left ventricular dilatation. Aortic aneurysm NOS (ICD10-I71.9). Signed, HCriselda Peaches MD [facility] Electronically Signed   By: HJacqulynn CadetM.D.   On: 03/19/2017 10:07   I have independently reviewed the above radiology studies  and reviewed the findings with the patient.  ECHO: Result status: Edited Result - FINAL                           *[facility]*                        [address] [place]  [address]                            [phone]  ------------------------------------------------------------------- Echocardiography  (Report amended )  Patient:    Clint, Biello MR #:       [id number] Study Date: 03/19/2017 Gender:     M Age:        22 Height:     180.3 cm Weight:     89.8 kg BSA:        2.14 m^2 Pt. Status: Room:   SONOGRAPHER  Cindy Hazy, RDCS  ATTENDING    Ena Dawley, M.D.  PERFORMING   [facility], Outpatient  Joanna Hews [id number]  [place] [facility] [id number]  cc:  ------------------------------------------------------------------- LV EF: 50% -   55%  ------------------------------------------------------------------- Indications:      I35.9 Aortic Valve Disorder.  ------------------------------------------------------------------- History:   PMH:  Acquired from the patient and from the patient&'s chart.  PMH:  Obstructive Sleep Apnea-CPAP. Dilated Aortic Root. Risk factors:  Former tobacco use. Dyslipidemia.  ------------------------------------------------------------------- Study Conclusions  - Left ventricle: The cavity size was severely dilated. Wall   thickness was normal. Systolic function was normal. The estimated   ejection fraction was in the range of 50% to 55%. Wall motion was   normal; there were no regional wall motion abnormalities. Doppler   parameters are consistent with abnormal left ventricular   relaxation (grade 1 diastolic dysfunction). There was no evidence   of elevated ventricular  filling pressure by Doppler parameters. - Aortic valve: There was severe regurgitation. - Aortic root: The aortic root was moderately dilated measuring 46   mm. - Ascending aorta: The ascending aorta was upper normal size   measuring 40 mm. - Mitral valve: There was mild regurgitation. - Left atrium: The atrium was mildly dilated. - Right ventricle: The cavity size was normal. Wall thickness was   normal. Systolic function was normal. - Right atrium: The atrium was normal in size. - Inferior vena cava: The vessel was normal in size. - Pericardium, extracardiac: There was no pericardial effusion.  Impressions:  - When compared to the prior study from 04/13/2016 there are   significant changes.   LV size has increased from normal to moderately to severely   dilated.   LVEF has decreased from 60-65% to 50-55%.   Aortic root remains moderately dilated with now severe aortic   regurgitation.   There is partially flail or redundant chordae of the anterior   mitral valve leaflet with no significant mitral regurgitation.     CT surgery consult should be considered.  ------------------------------------------------------------------- Study data:   Study status:  Routine.  Procedure:  The patient reported no pain pre or post test. Transthoracic echocardiography for left ventricular function evaluation, for right ventricular function evaluation, and for assessment of valvular function. Image quality was adequate.  Study completion:  There were no complications.          Echocardiography.  M-mode, complete 2D, spectral Doppler, and color Doppler.  Birthdate:  Patient birthdate: [birth year].  Age:  Patient is 61 yr old.  Sex:  Gender: male.    BMI: 27.6 kg/m^2.  Blood pressure:     125/64  Patient status:  Outpatient.  Study date:  Study date: 03/19/2017. Study time: 08:42 AM.  Location:  [facility]  3  -------------------------------------------------------------------  ------------------------------------------------------------------- Left ventricle:  The cavity size was severely dilated. Wall  thickness was normal. Systolic function was normal. The estimated ejection fraction was in the range of 50% to 55%. Wall motion was normal; there were no regional wall motion abnormalities. Doppler parameters are consistent with abnormal left ventricular relaxation (grade 1 diastolic dysfunction). There was no evidence of elevated ventricular filling pressure by Doppler parameters.  ------------------------------------------------------------------- Aortic valve:   Trileaflet; mildly thickened, mildly calcified leaflets. Mobility was not restricted.  Doppler:  Transvalvular velocity was within the normal range. There was no stenosis. There was severe regurgitation.    VTI ratio of LVOT to aortic valve: 0.73. Valve area (VTI): 4.47 cm^2. Indexed valve area (VTI): 2.09 cm^2/m^2. Peak velocity ratio of LVOT to aortic valve: 0.68. Valve area (Vmax): 4.19 cm^2. Indexed valve area (Vmax): 1.96 cm^2/m^2. Mean velocity ratio of LVOT to aortic valve: 0.66. Valve area (Vmean): 4.05 cm^2. Indexed valve area (Vmean): 1.89 cm^2/m^2. Mean gradient (S): 7 mm Hg. Peak gradient (S): 12 mm Hg.  ------------------------------------------------------------------- Aorta:  Aortic root: The aortic root was moderately dilated measuring 46 mm. Ascending aorta: The ascending aorta was upper normal size measuring 40 mm.  ------------------------------------------------------------------- Mitral valve:   Structurally normal valve.   Mobility was not restricted.  Doppler:  Transvalvular velocity was within the normal range. There was no evidence for stenosis. There was mild regurgitation.  ------------------------------------------------------------------- Left atrium:  The atrium was mildly  dilated.  ------------------------------------------------------------------- Right ventricle:  The cavity size was normal. Wall thickness was normal. Systolic function was normal.  ------------------------------------------------------------------- Pulmonic valve:    Structurally normal valve.   Cusp separation was normal.  Doppler:  Transvalvular velocity was within the normal range. There was no evidence for stenosis. There was trivial regurgitation.  ------------------------------------------------------------------- Tricuspid valve:   Structurally normal valve.    Doppler: Transvalvular velocity was within the normal range. There was no regurgitation.  ------------------------------------------------------------------- Pulmonary artery:   The main pulmonary artery was normal-sized. Systolic pressure could not be accurately estimated.  ------------------------------------------------------------------- Right atrium:  The atrium was normal in size.  ------------------------------------------------------------------- Pericardium:  There was no pericardial effusion.  ------------------------------------------------------------------- Systemic veins: Inferior vena cava: The vessel was normal in size.  ------------------------------------------------------------------- Measurements   Left ventricle                            Value          Reference  LV ID, ED, PLAX chordal           (H)     64    mm       43 - 52  LV ID, ES, PLAX chordal           (H)     46.1  mm       23 - 38  LV fx shortening, PLAX chordal    (L)     28    %        >=29  LV PW thickness, ED                       10    mm       ---------  IVS/LV PW ratio, ED                       0.9            <=1.3  Stroke volume, 2D  161   ml       ---------  Stroke volume/bsa, 2D                     75    ml/m^2   ---------  LV e&', lateral                            5.59  cm/s      ---------  LV E/e&', lateral                          10.41          ---------  LV e&', medial                             8.77  cm/s     ---------  LV E/e&', medial                           6.64           ---------  LV e&', average                            7.18  cm/s     ---------  LV E/e&', average                          8.11           ---------    Ventricular septum                        Value          Reference  IVS thickness, ED                         9.01  mm       ---------    LVOT                                      Value          Reference  LVOT ID, S                                28    mm       ---------  LVOT area                                 6.16  cm^2     ---------  LVOT ID                                   28    mm       ---------  LVOT peak velocity, S                     119   cm/s     ---------  LVOT mean velocity, S  85.4  cm/s     ---------  LVOT VTI, S                               26.1  cm       ---------  LVOT peak gradient, S                     6     mm Hg    ---------  Stroke volume (SV), LVOT DP               160.7 ml       ---------  Stroke index (SV/bsa), LVOT DP            75.2  ml/m^2   ---------    Aortic valve                              Value          Reference  Aortic valve peak velocity, S             175   cm/s     ---------  Aortic valve mean velocity, S             130   cm/s     ---------  Aortic valve VTI, S                       36    cm       ---------  Aortic mean gradient, S                   7     mm Hg    ---------  Aortic peak gradient, S                   12    mm Hg    ---------  VTI ratio, LVOT/AV                        0.73           ---------  Aortic valve area, VTI                    4.47  cm^2     ---------  Aortic valve area/bsa, VTI                2.09  cm^2/m^2 ---------  Velocity ratio, peak, LVOT/AV             0.68           ---------  Aortic valve area, peak velocity          4.19  cm^2      ---------  Aortic valve area/bsa, peak               1.96  cm^2/m^2 ---------  velocity  Velocity ratio, mean, LVOT/AV             0.66           ---------  Aortic valve area, mean velocity          4.05  cm^2     ---------  Aortic valve area/bsa, mean               1.89  cm^2/m^2 ---------  velocity  Aortic regurg pressure half-time  356   ms       ---------    Aorta                                     Value          Reference  Aortic root ID, ED                        46    mm       ---------  Ascending aorta ID, A-P, S                40    mm       ---------    Left atrium                               Value          Reference  LA ID, A-P, ES                            40    mm       ---------  LA ID/bsa, A-P                            1.87  cm/m^2   <=2.2  LA volume, S                              60    ml       ---------  LA volume/bsa, S                          28.1  ml/m^2   ---------  LA volume, ES, 1-p A4C                    54    ml       ---------  LA volume/bsa, ES, 1-p A4C                25.3  ml/m^2   ---------  LA volume, ES, 1-p A2C                    62    ml       ---------  LA volume/bsa, ES, 1-p A2C                29    ml/m^2   ---------    Mitral valve                              Value          Reference  Mitral E-wave peak velocity               58.2  cm/s     ---------  Mitral A-wave peak velocity               76    cm/s     ---------  Mitral deceleration time          (H)     271   ms  150 - 230  Mitral E/A ratio, peak                    0.8            ---------    Right ventricle                           Value          Reference  RV s&', lateral, S                         20.8  cm/s     ---------  Legend: (L)  and  (H)  mark values outside specified reference range.  ------------------------------------------------------------------- Lance Morin, M.D. 2018-11-20T17:55:38   TEE: *[facility]*                         [address], [place] [address]                            [phone]  ------------------------------------------------------------------- Transesophageal Echocardiography  Patient:    Ebon, Ketchum MR #:       [id number] Study Date: 03/29/2017 Gender:     M Age:        76 Height:     180.3 cm Weight:     88.2 kg BSA:        2.12 m^2 Pt. Status: Room:   ADMITTING    Kirk Ruths  ATTENDING    Kirk Ruths  ORDERING     Kirk Ruths  PERFORMING   Kirk Ruths  REFERRING    Kirk Ruths  SONOGRAPHER  Jannett Celestine, RDCS  cc:  ------------------------------------------------------------------- LV EF: 55% -   60%  ------------------------------------------------------------------- History:   PMH:  Aortic Valve Disease.  Risk factors: Dyslipidemia.  ------------------------------------------------------------------- Study Conclusions  - Left ventricle: Systolic function was normal. The estimated   ejection fraction was in the range of 55% to 60%. Wall motion was   normal; there were no regional wall motion abnormalities. - Aortic valve: There was a small vegetation on the left   ventricular aspect of the left coronary cusp. There was severe   regurgitation. - Mitral valve: No evidence of vegetation. There was mild   regurgitation. - Right atrium: No evidence of thrombus in the atrial cavity or   appendage. - Tricuspid valve: No evidence of vegetation. - Pulmonic valve: No evidence of vegetation.  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  ------------------------------------------------------------------- Study  data:   Study status:  Routine.  Consent:  The risks, benefits, and alternatives to the procedure were explained to the patient and informed consent was obtained.  Procedure:  The patient reported no pain pre or post test. Initial setup. The patient was brought to the laboratory. Surface ECG leads  were monitored. Sedation. Sedation was administered by cardiology staff. Transesophageal echocardiography. A transesophageal probe was inserted by the attending cardiologist. Image quality was adequate.  Study completion:  The patient tolerated the procedure well. There were no complications.          Diagnostic transesophageal echocardiography.  2D and color Doppler.  Birthdate:  Patient birthdate: 1955-11-16.  Age:  Patient is 61 yr old.  Sex:  Gender: male.    BMI: 27.1 kg/m^2.  Blood pressure:     117/50  Patient status:  Outpatient.  Study date:  Study date: 03/29/2017. Study time: 10:58 AM.  Location:  Endoscopy.  -------------------------------------------------------------------  ------------------------------------------------------------------- Left ventricle:  Systolic function was normal. The estimated ejection fraction was in the range of 55% to 60%. Wall motion was normal; there were no regional wall motion abnormalities.  ------------------------------------------------------------------- Aortic valve:   Trileaflet; mildly thickened leaflets. There was a small vegetation on the left ventricular aspect of the left coronary cusp.  Doppler:  There was severe regurgitation.  ------------------------------------------------------------------- Aorta:  Descending aorta: The descending aorta had mild diffuse disease.  ------------------------------------------------------------------- Mitral valve:   Mildly thickened leaflets . Leaflet separation was normal.  No evidence of vegetation.  Doppler:  There was  mild regurgitation.  ------------------------------------------------------------------- Left atrium:  The atrium was normal in size.  ------------------------------------------------------------------- Right ventricle:  The cavity size was normal. Systolic function was normal.  ------------------------------------------------------------------- Pulmonic valve:    Structurally normal valve.   Cusp separation was normal.  No evidence of vegetation.  ------------------------------------------------------------------- Tricuspid valve:   Structurally normal valve.   Leaflet separation was normal.  No evidence of vegetation.  Doppler:  There was mild regurgitation.  ------------------------------------------------------------------- Right atrium:  The atrium was normal in size.  No evidence of thrombus in the atrial cavity or appendage.  ------------------------------------------------------------------- Pericardium:  There was no pericardial effusion.   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 - RESECTION MARGINS, NEGATIVE FOR MALIGNANCY. - PLEASE SEE ONCOLOGY TEMPLATE FOR DETAILS. Microscopic Comment ONCOLOGY TABLE - TESTIS 1. Specimen and laterality: Right testis 2. Tumor focality: Unifocal 3. Macroscopic extent of tumor: Limited to testicular parenchyma. 4. Maximum tumor size (cm): 6.3 cm 5. Histologic type: Seminoma 6. Microscopic tumor extension: Limited to testis 7. Spermatic cord and surgical margins: Negative 8. Lymph-Vascular invasion: Present 9. Intratubular germ cell neoplasia: Present 10. Lymph nodes: # examined: N/A; # positive: N/A 11. TNM code: pT2, pNX 12. Serum tumor markers: See patient's medical record 13. Comment: The cut surfaces of the tumor show a  6.3 cm well circumscribed tan soft mass. Microscopically, sections show uniform malignant cells with prominent nucleoli, abundant cytoplasm with mitotic activity and tumor necrosis. Angiolymphatic invasion is present. The tumor is limited to testis. 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 Immunohistochemical stains were performed and the neoplastic cells show the following immunoprofile: CD117 - negative OCT4- positive EMA- negative CK AE1/AE3- negative PLAP - positive CD30 - virtually negative CD20 - negative AFP - negative 1 of 2 FINAL for STEPEN, PRINS ([id number]) Microscopic Comment(continued) HCG- negative The control stained appropriately. The overall morphologic and immunohistochemical finding are diagnostic for seminoma. The final resection margin is negative. (HCL:kh 09-15-13) H. CATHERINE LI MD   Aortic Size Index=     4.7    /Body surface area is 2.05 meters squared. = 2.2  < 2.75 cm/m2      4% risk per year  2.75 to 4.25          8% risk per year > 4.25 cm/m2    20% risk per year   Assessment / Plan:    1/Significant aortic insufficiency without overt symptoms of congestive heart failure but with evidence of left ventricular dilatation and relatively well preserved LV function.  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  Now with the complicating factor of endocarditis the timing of surgery becomes an issue.  Since  he does not have overt symptoms of congestive heart failure at this time and a sensitive organism a period of time of IV antibiotic therapy to sterilize the valve would be prudent.  Time will tell if a full 6 weeks of antibiotics can be carried out and then proceed with surgery.  The patient will need evaluation of his coronary arteries due to his age, although he has had no specific history of coronary artery disease.  We can consider good quality cardiac CT to evaluate the coronary arteries and if there is no question on the study avoid risks of cardiac catheterization in the setting of endocarditis.  I discussed with the patient replacement of the ascending aorta and aortic valve, hopefully the mitral valve we will not need intervention.  Mechanical versus tissue valve have been discussed with the patient.  We will continue to follow with you as we progress in his treatment.      The [employer] [facility]) and the [facility]) have issued a statement to clarify 2 previous guidelines from the [facility], [place], and collaborating societies addressing the risk of aortic dissection in patients with bicuspid aortic valves (BAV) and severe aortic enlargement. The 2 guidelines differ with regard to the recommended threshold of aortic root or ascending aortic dilatation that would justify surgical intervention in patients with bicuspid aortic valves. This new statement of clarification uses the ACC/AHA revised structure for delineating the Class of Recommendation and Level of Evidence to provide recommendation that replace those contained in Section 9.2.2.1 of the thoracic aortic disease guidelines and Section 5.1.3 of the valvular heart disease guideline. New recommendations in intervention in patients with BAV and dilatation of the aortic root (sinuses) or ascending aorta include:  . Operative intervention to repair or replace the aortic root (sinuses) or replace the ascending aorta is  indicated in asymptomatic patients with BAV if the diameter of the aortic root or ascending aorta is 5.5 cm or greater. (Class of recommendation 1, Level of evidence B-NR).  Marland Kitchen Operative intervention to repair or replace the aortic root (sinuses) or replace the ascending aorta is reasonable in asymptomatic patients with BAV if the diameter of the aortic root  or ascending aorta is 5.0 cm or greater and an additional risk factor for dissection is present or if the patient is at low surgical risk and the surgery is performed by an experienced aortic surgical team in a center with established expertise in these procedures. (Class of recommendation IIa; Level of Evidence B-NR).  . Replacement of the ascending aorta is reasonable in patients with BAV undergoing AVR because of severe aortic stenosis or aortic regurgitation when the diameter of the ascending aorta is greater than 4.5 cm (Class of recommendation IIa; Level of evidence C-EO).  Citation: Donnamarie Poag, Randolm Idol, et al. Surgery for aortic dilatation in patients with bicuspid aortic valves. A statement of clarification from the [employer]/American Heart Association Task Force on Clinical Practice Guidelines. [Published online ahead of print April 02, 2014]. Circulation. doi: 10.1161/CIR.0000000000000331.   I  spent 40 minutes counseling the patient face to face and 50% or more the  time was spent in counseling and coordination of care. The total time spent in the appointment was 60 minutes.    [name] MD      [address] [place] [address] Office [phone]   Beeper [phone]  03/31/2017 1:49 PM

## 2017-03-29 NOTE — Progress Notes (Signed)
Pharmacy Antibiotic Note  [name] Anderson is a 61 y.o. male admitted on 03/29/2017 with subacute endocarditis.    Plan: Abx to start at 2200 Vancomycin 1g q8h Unasyn 3 g q6h Monitor renal fx cx vt prn F/U CVTS plans  Height: 5\' 11"  (180.3 cm) Weight: 187 lb 12.8 oz (85.2 kg) IBW/kg (Calculated) : 75.3  Temp (24hrs), Avg:98.4 F (36.9 C), Min:98.1 F (36.7 C), Max:98.9 F (37.2 C)  Recent Labs  Lab 03/28/17 0907 03/29/17 1250  WBC 9.3 8.2  CREATININE 0.90 0.91    Estimated Creatinine Clearance: 90.8 mL/min (by C-G formula based on SCr of 0.91 mg/dL).    No Known Allergies  Levester Fresh, PharmD, BCPS, BCCCP Clinical Pharmacist Clinical phone for 03/29/2017 from 7a-3:30p: [phone] If after 3:30p, please call main pharmacy at: [phone] 03/29/2017 6:18 PM

## 2017-03-29 NOTE — Consult Note (Signed)
[facility] [facility] for Infectious Disease    Date of Admission:  03/29/2017     Total days of antibiotics 0               Reason for Consult: Endocarditis    Referring Provider: Dr. Stanford Breed  Primary Care Provider: Antony Contras, MD   Assessment/Plan: 61 year old male with previous history of aortic insufficieny, testicular cancer, hyperlipidemia and Barrett's esophagus undergoing further examination of worsening aortic insufficiency and noted to have endocarditis on his TEE. It is recommended for future aortic valve replacement with a consult to CVTS. Initial blood cultures are pending. He is afebrile.   Endocarditis - Noted on TEE. Currently afebrile with no systemic symptoms. Will hold antibiotics pending additional set of blood cultures this evening. Will require a PICC and 6 weeks of antibiotics with decision on antibiotic pending blood culture results.   Aortic insufficieny - Currently asymptomatic with cardiology favoring aortic valve replacement and CVTS consulted.     Active Problems:   AVD (aortic valve disease)   Endocarditis determined by echocardiography   . aspirin  81 mg Oral Daily  . atorvastatin  10 mg Oral q1800  . ferrous sulfate  325 mg Oral Daily  . heparin  5,000 Units Subcutaneous Q8H  . [START ON 03/30/2017] losartan  50 mg Oral Daily  . sodium chloride flush  3 mL Intravenous Q12H  . vitamin C  1,000 mg Oral Daily     HPI: [name] Anderson is a 61 y.o. male with a previous medical history of aortic insufficiency, testicular cancer, hyperlipidemia, and Barrett's esophagus undergoing further cardiac workup for aortic insufficiency with a transesophageal echo performed today indicating worsening aortic insufficiency and decreasing left ventricular function and newly found endocarditis. At the writing of this note there are no results of the TEE. Previously noted to have dizzy spells and transient visual disturbances within the last month with no current  neurological symptoms.  Cardiology expresses concern for possible dislodged vegetation as a cause of his neurological symptoms. He works part time in an Museum/gallery exhibitions officer. Denies any fevers, chills, rashes or weight loss. He has had night sweats on occasion. No recent travel or exposure to cats/animals.   Review of Systems: Review of Systems  Constitutional: Negative for chills, fever and weight loss.  Respiratory: Negative for cough, sputum production and wheezing.   Cardiovascular: Negative for chest pain.  Skin: Negative for rash.  Neurological: Negative for dizziness, weakness and headaches.     Past Medical History:  Diagnosis Date  . Allergy   . Anemia   . Barrett's esophagus - short segment 03/13/2017  . Cancer ([place]) 09/11/13   testicular  . ED (erectile dysfunction)   . H/O aneurysm 03/05/2017  . High cholesterol   . History of basal cell carcinoma excision    02/2002   --  NOSE  &   2013  RIGHT EAR  . History of kidney stones   . Hyperlipidemia   . Mild obstructive sleep apnea    PER STUDY 01-18-2005--  NO CPAP OR MOUTH GUARD  . Moderate aortic valve insufficiency   . 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 Personal history of colonic polyps    TUBULAR ADENOMA--   . Seasonal allergies   . 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[place])   . Testicular mass    RIGHT    Social History   Tobacco Use  . Smoking status: Former  Smoker    Types: Pipe  . Smokeless tobacco: Former Systems developer    Types: [place] date: 04/26/2011  . Tobacco comment: OCCASIONAL  PIPE SMOKER  (NEVER SMOKED CIGARETTES)  Substance Use Topics  . Alcohol use: Yes    Comment: Occasional  . Drug use: No    Family History  Problem Relation Age of Onset  . Colon cancer Father   . Heart disease Paternal Grandmother     No Known Allergies  OBJECTIVE: Blood pressure (!) 126/46, pulse 90, temperature 98.9 F (37.2 C), temperature source Oral, resp. rate (!) 28, height 5\' 11"   (1.803 m), weight 187 lb 12.8 oz (85.2 kg), SpO2 98 %.  Physical Exam  Constitutional: He is oriented to person, place, and time and well-developed, well-nourished, and in no distress. No distress.  Pleasant, lying in bed in no apparent distress.   Neck: Neck supple.  Cardiovascular: Intact distal pulses. Exam reveals no gallop and no friction rub.  Murmur heard. Pulmonary/Chest: Effort normal and breath sounds normal. No respiratory distress. He has no wheezes. He has no rales. He exhibits no tenderness.  Abdominal: Soft. Bowel sounds are normal. He exhibits no distension and no mass. There is no tenderness. There is no rebound and no guarding.  Lymphadenopathy:    He has no cervical adenopathy.  Neurological: He is alert and oriented to person, place, and time.  Skin: Skin is warm. No rash noted.  Psychiatric: Mood, memory, affect and judgment normal.    Lab Results Lab Results  Component Value Date   WBC 8.2 03/29/2017   HGB 11.0 (L) 03/29/2017   HCT 34.8 (L) 03/29/2017   MCV 80.7 03/29/2017   PLT 234 03/29/2017    Lab Results  Component Value Date   CREATININE 0.91 03/29/2017   BUN 13 03/29/2017   NA 135 03/29/2017   K 4.5 03/29/2017   CL 105 03/29/2017   CO2 26 03/29/2017    Lab Results  Component Value Date   ALT 13 (L) 03/29/2017   AST 19 03/29/2017   ALKPHOS 60 03/29/2017   BILITOT 0.7 03/29/2017     Microbiology: No results found for this or any previous visit (from the past 240 hour(s)).   Terri Piedra, NP [facility] [place] [facility] [phone] Pager  03/29/2017  4:45 PM

## 2017-03-29 NOTE — CV Procedure (Signed)
    Transesophageal Echocardiogram Note  FINNBAR CEDILLOS [id number] Feb 17, 1956  Procedure: Transesophageal Echocardiogram Indications: AI  Procedure Details Consent: Obtained Time Out: Verified patient identification, verified procedure, site/side was marked, verified correct patient position, special equipment/implants available, Radiology Safety Procedures followed,  medications/allergies/relevent history reviewed, required imaging and test results available.  Performed  Medications:  During this procedure the patient is administered a total of Versed 5 mg and Fentanyl 50 mcg  to achieve and maintain moderate conscious sedation.  The patient's heart rate, blood pressure, and oxygen saturation are monitored continuously during the procedure. The period of conscious sedation is 30 minutes, of which I was present face-to-face 100% of this time.  Low normal LV systolic function; oscillating density on left coronary cusp concerning for endocarditis; severe AI; redundant MV chord; cannot R/O associated vegetation.   Complications: No apparent complications Patient did tolerate procedure well.  Kirk Ruths, MD

## 2017-03-29 NOTE — H&P (Addendum)
Cardiology Admission History and Physical:   Patient ID: WESLEE FOGG; MRN: [id number]; DOB: [birth year]   Admission date: 03/29/2017  Primary Care Provider: Antony Contras, MD Primary Cardiologist: Dr. Stanford Breed  Chief Complaint:  Endocarditis  Patient Profile:   CINDY BRINDISI is a 61 y.o. male with a history of aortic insufficiency. Most recent echocardiogram shows aortic insufficiency has worsened and is now severe. Left ventricularfunctionis reduced compared to previous and there is LVE (LVESD 46 mm). He is not having symptoms but based on decreasing LV function would favor aortic valve replacement. He was brought in for TEE on 11/30 which shows evidence of endocarditis. Previous medical history also significant for testicular cancer, HLD, mild OSA not on CPAP.  History of Present Illness:   Mr. Faughn was seen at office visit yesterday by Dr. Stanford Breed for follow up of aortic insufficiency. Per note: Echocardiogram repeated December 2017 and showed normal LV function, grade 1 diastolic dysfunction, mild to moderate aortic insufficiency, dilated aorta, mild mitral regurgitation. CTA November 2017 showed stable aortic root dilatation at 4.7 cm. Had syncope 8/18. Monitor in September 2018 showed no significant arrhythmia. Carotid Dopplers October 2018 showed no significant stenosis. Brain MRA November 2018 normal. Echocardiogram repeated November 2018. Ejection fraction 50-55%, severe left ventricular enlargement, grade 1 diastolic dysfunction, severe aortic insufficiency, dilated aortic root at 46 mm, mild mitral regurgitation and mild left atrial enlargement. CTA of thoracic aorta repeated November 2018. The aortic root measured 4.7 cm. There were wedge-shaped lesions in the spleen question splenic infarcts.   A tee was arranged and done today. The patient denies feeling ill, chills, fever, chest pain or shortness of breath. He has had some mild occ dizziness, last in early November. He had an  episode of double vision for about 5 minutes last month with no reoccurrence. He had an MRA of the head on 03/12/17 that was normal. He also had carotid dopplers on 02/18/2017 that showed No evidence of significant atherosclerotic plaque or carotid stenosis in the neck bilaterally. Some velocity elevation is present which is matched by velocity elevation in the common carotid artery. Some of this velocity elevation is attributable to tortuosity.   Past Medical History:  Diagnosis Date  . Allergy   . Anemia   . Barrett's esophagus - short segment 03/13/2017  . Cancer ([place]) 09/11/13   testicular  . ED (erectile dysfunction)   . H/O aneurysm 03/05/2017  . High cholesterol   . History of basal cell carcinoma excision    02/2002   --  NOSE  &   2013  RIGHT EAR  . History of kidney stones   . Hyperlipidemia   . Mild obstructive sleep apnea    PER STUDY 01-18-2005--  NO CPAP OR MOUTH GUARD  . Moderate aortic valve insufficiency   . 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 Personal history of colonic polyps    TUBULAR ADENOMA--   . Seasonal allergies   . 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[place])   . Testicular mass    RIGHT    Past Surgical History:  Procedure Laterality Date  . COLONOSCOPY  01/24/06, 07/10/11  . EXTRACORPOREAL SHOCK WAVE LITHOTRIPSY    . MOHS SURGERY  02/2002  &  2013   TIP OF NOSE-///     RIGHT EAR  . ORCHIECTOMY Right 09/11/2013   Procedure:  RIGHT ORCHIECTOMY;  Surgeon: Jorja Loa, MD;  Location: [facility];  Service: Urology;  Laterality:  Right;  Marland Kitchen SCROTAL EXPLORATION Right 09/11/2013   Procedure: RIGHT INGUINAL EXPLORATION;  Surgeon: Jorja Loa, MD;  Location: [facility];  Service: Urology;  Laterality: Right;  . TRANSTHORACIC ECHOCARDIOGRAM  02-25-2013   DR CRENSHAW   NORMAL LVSF/  EF 55-60%/   GRADE 2 DIASTOLIC DYSFUNCTION/  ECCENTRIC MODERATE AORTIC INSUFFICIENCY WITHOUT STENOSIS/  MILD DILATED AORTIC  ROOT/ TRIVIAL MR, TR,  & PR  . UMBILICAL HERNIA REPAIR  06-08-2009  . URETEROSCOPIC LASER LITHOTRIPSY STONE EXTRACTION  2007     Medications Prior to Admission: Prior to Admission medications   Medication Sig Start Date End Date Taking? Authorizing Provider  acetaminophen (TYLENOL) 500 MG tablet Take 1,000 mg by mouth every 6 (six) hours as needed (for pain.).    Yes [provider]  aluminum chloride (HYPERCARE) 20 % external solution Apply 1 application topically 3 (three) times daily as needed (for sweating.).   Yes [provider]  amoxicillin (AMOXIL) 500 MG capsule TAKE 4 TABLETS BY MOUTH 1 HOUR PRIOR TO DENTAL APPOINTMENT   Yes Noralee Space, MD  Ascorbic Acid (VITAMIN C) 1000 MG tablet Take 1,000 mg by mouth daily.   Yes [provider]  aspirin 81 MG chewable tablet Chew 81 mg by mouth daily.   Yes [provider]  [employer] TRIPLE MAGNESIUM COMPLEX PO Take 1 tablet by mouth 2 (two) times daily.   Yes [provider]  ECHINACEA PO Take 760 mg by mouth daily.   Yes [provider]  Ferrous Sulfate (IRON) 325 (65 Fe) MG TABS Take 325 mg by mouth daily.    Yes [provider]  Glucosamine-Chondroitin (COSAMIN DS PO) Take 1 tablet by mouth 2 (two) times daily.   Yes [provider]  losartan (COZAAR) 50 MG tablet Take 1 tablet (50 mg total) by mouth daily. 03/19/16 03/29/18 Yes Lelon Perla, MD  Naphazoline-Pheniramine (OPCON-A) 0.027-0.315 % SOLN Place 1-2 drops into both eyes 3 (three) times daily as needed (for allergy eyes.).   Yes [provider]  NON FORMULARY Take 2 capsules by mouth 2 (two) times daily. DoTerra (Microplex VMz) 2 Capsule twice daily   Yes [provider]  NON FORMULARY Take 2 capsules by mouth 2 (two) times daily. XE0 Mega "Doterra"   Yes [provider]  sildenafil (REVATIO) 20 MG tablet Take 20 mg by mouth daily as needed (for ED).  02/11/13  Yes Noralee Space, MD    SUPER B COMPLEX/C PO Take 1 tablet by mouth daily.   Yes [provider]  triamcinolone cream (KENALOG) 0.1 % Apply 1 application topically daily as needed (FOR DRY SKIN/ITCHY SKIN.). Apply to area after bath (DO NOT APPLY TO FACE) 01/03/14  Yes [provider]     Allergies:   No Known Allergies  Social History:   Social History   Socioeconomic History  . Marital status: Married    Spouse name: Coralyn Mark  . Number of children: 2  . Years of education: 25  . Highest education level: Not on file  Social Needs  . Financial resource strain: Not on file  . Food insecurity - worry: Not on file  . Food insecurity - inability: Not on file  . Transportation needs - medical: Not on file  . Transportation needs - non-medical: Not on file  Occupational History  . 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 Smoking status: Former Smoker    Types: Pipe  . Smokeless  tobacco: Former Systems developer    Types: Chew    Quit date: 04/26/2011  . Tobacco comment: OCCASIONAL  PIPE SMOKER  (NEVER SMOKED CIGARETTES)  Substance and Sexual Activity  . Alcohol use: Yes    Comment: Occasional  . Drug use: No  . Sexual activity: Not on file  Other Topics Concern  . Not on file  Social History Narrative   Lives with wife Coralyn Mark   Caffeine use: coffee- 1 cup per day    Family History:   The patient's family history includes Colon cancer in his father; Heart disease in his paternal grandmother.    ROS:  Please see the history of present illness.  All other ROS reviewed and negative.     Physical Exam/Data:   Vitals:   03/29/17 1122 03/29/17 1125 03/29/17 1130 03/29/17 1145  BP: (!) 130/52 (!) 130/52 (!) 123/52 (!) 117/50  Pulse: 81 86    Resp: (!) 27 16 (!) 21 (!) 28  Temp:   98.1 F (36.7 C)   TempSrc:   Oral   SpO2: 98% 98% 96% 95%  Weight:      Height:       No intake or output data in the 24 hours ending 03/29/17 1159 Filed Weights   03/29/17 1008  Weight: 194 lb (88 kg)    Body mass index is 27.06 kg/m.  General:  Well nourished, well developed, in no acute distress HEENT: normal Lymph: no adenopathy Neck: no JVD Endocrine:  No thryomegaly Vascular: No carotid bruits; FA pulses 2+ bilaterally without bruits  Cardiac:  normal S1, S2; RRR; 2/6 aortic murmur Lungs:  clear to auscultation bilaterally, no wheezing, rhonchi or rales  Abd: soft, nontender, no hepatomegaly  Ext: no edema Musculoskeletal:  No deformities, BUE and BLE strength normal and equal Skin: warm and dry  Neuro:  CNs 2-12 intact, no focal abnormalities noted Psych:  Normal affect    EKG:  The ECG has been ordered  Relevant CV Studies:  TEE done today  Echocardiogram 03/19/17 Study Conclusions - Left ventricle: The cavity size was severely dilated. Wall   thickness was normal. Systolic function was normal. The estimated   ejection fraction was in the range of 50% to 55%. Wall motion was   normal; there were no regional wall motion abnormalities. Doppler   parameters are consistent with abnormal left ventricular   relaxation (grade 1 diastolic dysfunction). There was no evidence   of elevated ventricular filling pressure by Doppler parameters. - Aortic valve: There was severe regurgitation. - Aortic root: The aortic root was moderately dilated measuring 46   mm. - Ascending aorta: The ascending aorta was upper normal size   measuring 40 mm. - Mitral valve: There was mild regurgitation. - Left atrium: The atrium was mildly dilated. - Right ventricle: The cavity size was normal. Wall thickness was   normal. Systolic function was normal. - Right atrium: The atrium was normal in size. - Inferior vena cava: The vessel was normal in size. - Pericardium, extracardiac: There was no pericardial effusion.  Impressions: - When compared to the prior study from 04/13/2016 there are   significant changes.   LV size has increased from normal to moderately to severely   dilated.    LVEF has decreased from 60-65% to 50-55%.   Aortic root remains moderately dilated with now severe aortic   regurgitation.   There is partially flail or redundant chordae of the anterior   mitral valve leaflet with no significant  mitral regurgitation.     CT surgery consult should be considered.    Laboratory Data:  Chemistry Recent Labs  Lab 03/28/17 0907  NA 138  K 5.0  CL 101  CO2 26  GLUCOSE 101*  BUN 17  CREATININE 0.90  CALCIUM 9.3  GFRNONAA 92  GFRAA 106    No results for input(s): PROT, ALBUMIN, AST, ALT, ALKPHOS, BILITOT in the last 168 hours. Hematology Recent Labs  Lab 03/28/17 0907  WBC 9.3  RBC 4.46  HGB 11.3*  HCT 34.8*  MCV 78*  MCH 25.3*  MCHC 32.5  RDW 17.5*  PLT 268   Cardiac EnzymesNo results for input(s): TROPONINI in the last 168 hours. No results for input(s): TROPIPOC in the last 168 hours.  BNPNo results for input(s): BNP, PROBNP in the last 168 hours.  DDimer No results for input(s): DDIMER in the last 168 hours.  Radiology/Studies:  No results found.  Assessment and Plan:   1. Endocarditis -Pt with aortic insufficiency. TEE done today indicating endocarditis -Will admit to telemetry -Obtain labs including blood cultures, CBC, CMet, Sed rate, UA. CXR. -Will consult ID for IV antibiotics -Pt has had dizziness and transient visual disturbance in the last month. Question possible dislodged vegetation. He had a normal MRA of the head on 03/12/17. -Will start on empiric antibiotics until ID sees  2. Aortic insufficiency -Most recent echo showed AI has worsened and is now severe. LV function is reduced compared to previous and and there is LVE (LVESD 46 mm). He is not having symptoms but based on decreasing LV function would favor aortic valve replacement.  -cardiothoracic surgery has been notified of pt  3. Thoracic aortic aneurysm -Pt will need aortic root replacement at time of aortic valve surgery  4. Hypertension -BP well  controlled. Continue losartan 50 mg  5. Hyperlipidemia -Not currenlty on statin- stopped due to generalized aching. Will restart atorvastatin at lower dose.  Will check lipid panel.  6 ? Splenic infarcts-noted on CT scan. There is question of lymphoma based on radiology report. Will arrange hematology evaluation. Given question infarcts transesophageal echocardiogram done to better assess aortic insufficiency and rule outsource of embolus including LAA thrombus. No H/O atrial fibrillation. Doubt splenic infarcts given no history of abdominal pain. However he has also had transient double vision. Question TIAs.Note patient had recent EGD for anemia and there was possible Barrett's esophagus and moderate inflammation in the gastric antrum. Colonoscopy showed diverticula.   Severity of Illness: The appropriate patient status for this patient is INPATIENT. Inpatient status is judged to be reasonable and necessary in order to provide the required intensity of service to ensure the patient's safety. The patient's presenting symptoms, physical exam findings, and initial radiographic and laboratory data in the context of their chronic comorbidities is felt to place them at high risk for further clinical deterioration. Furthermore, it is not anticipated that the patient will be medically stable for discharge from the hospital within 2 midnights of admission. The following factors support the patient status of inpatient.   " The patient's presenting symptoms include recent dizziness, visual disturbance . " The worrisome physical exam findings include endocarditis on TEE, severe AI. " The initial radiographic and laboratory data are worrisome because of endocarditis on TEE, worsened EF. " The chronic co-morbidities include severe AI, HTN, HLD   * I certify that at the point of admission it is my clinical judgment that the patient will require inpatient hospital care spanning beyond 2 midnights  from the point  of admission due to high intensity of service, high risk for further deterioration and high frequency of surveillance required.*    For questions or updates, please contact [place] Please consult [email or web address] for contact info under Cardiology/STEMI.    Signed, Daune Perch, NP  03/29/2017 11:59 AM  As above, patient seen and examined.  Briefly he is a 61 year old male with past medical history of thoracic aortic aneurysm, aortic insufficiency, hypertension, hyperlipidemia, testicular cancer admitted with probable endocarditis.  Patient was recently evaluated for a syncopal episode by Almyra Deforest.  Repeat echocardiogram was ordered and showed ejection fraction 50-55% which was reduced compared to previous.  Aortic insufficiency was now severe compared to December 2017 when it was mild to moderate.  CTA of the chest November 2018 showed thoracic aortic aneurysm measuring 4.7 cm.  There were wedge-shaped lesions in the spleen concerning for splenic infarcts.  I saw the patient in follow-up in the office yesterday and scheduled a transesophageal echocardiogram today to rule out source of embolus given question splenic infarcts on CT scan.  The patient also describes transient double vision approximately 2 months ago.  Note recent MRI of the head negative.  Transesophageal echocardiogram today shows normal LV systolic function, oscillating density on left coronary aortic cusp concerning for vegetation and severe aortic insufficiency.  There was redundant mitral valve chordae and associated vegetation could not be excluded.  Mild mitral regurgitation.  Note patient describes 3-4 months of fatigue, weight loss and night sweats.  Occasional cold sensation but no fevers or chills.  He had his teeth cleaned 6 months ago.  No recent skin infections.  He denies any recent abdominal pain.  1 probable subacute bacterial endocarditis-patient presents with complaints of fatigue, night sweats, weight loss and  feeling of cold intermittently.  His transesophageal echocardiogram shows probable vegetation on the aortic valve with severe aortic insufficiency.  I cannot exclude vegetation on mitral valve chordae although he has only mild mitral regurgitation.  We will admit to telemetry.  Draw 3 blood cultures.  Check CBC, sed rate and urinalysis.  After blood cultures drawn begin vancomycin per pharmacy.  I will ask infectious disease to evaluate with recommendations concerning antibiotics.  Patient did have recent TIA type symptoms with double vision.  However recent MRI negative.  There is note of possible splenic infarcts on CT scan which could be embolic from aortic valve vegetation.  Given history of thoracic aortic aneurysm, aortic valve endocarditis, severe aortic insufficiency and reduced LV function compared to December 2017 pt will ultimately require aortic valve replacement and aortic root replacement.  I will ask cardiothoracic surgery to evaluate though may want to treat with antibiotics for now with surgery in 4-6 weeks. With aortic valve vegetation, would like to avoid cath if possible due to risk of embolic event with catheter manipulation; may be best to perform cardiac gated CT and if coronaries well visualized, avoid cath.  2 history of thoracic aortic aneurysm and severe aortic insufficiency-plan as outlined above.  3 normocytic anemia-patient with recent diagnosis of anemia.  EGD showed probable Barrett's esophagus and moderate inflammation in the gastric antrum.  Colonoscopy showed diverticula.  His anemia is likely related to endocarditis.  4 hypertension-continue preadmission blood pressure medications.  5 hyperlipidemia-continue statin.  Kirk Ruths, MD

## 2017-03-29 NOTE — Progress Notes (Signed)
Pharmacy Antibiotic Note  [name] Anderson is a 61 y.o. male admitted on 03/29/2017 with endocarditis.  Pharmacy has been consulted for vancomycin dosing. Cardiology consulting ID for further recommendations. SCr for today pending, 0.9 on 11/29.  Plan: Vancomycin 2g IV x 1 Monitor clinical progress, c/s, renal function F/u ID recommendations for antibiotic plans F/u SCr for today for vancomycin maintenance dosing  Height: 5\' 11"  (180.3 cm) Weight: 194 lb (88 kg) IBW/kg (Calculated) : 75.3  Temp (24hrs), Avg:98.1 F (36.7 C), Min:98.1 F (36.7 C), Max:98.1 F (36.7 C)  Recent Labs  Lab 03/28/17 0907 03/29/17 1250  WBC 9.3 8.2  CREATININE 0.90  --     Estimated Creatinine Clearance: 91.8 mL/min (by C-G formula based on SCr of 0.9 mg/dL).    No Known Allergies  Elicia Lamp, PharmD, BCPS Clinical Pharmacist 03/29/2017 1:32 PM

## 2017-03-30 ENCOUNTER — Encounter (HOSPITAL_COMMUNITY): Payer: Self-pay | Admitting: Cardiology

## 2017-03-30 DIAGNOSIS — I719 Aortic aneurysm of unspecified site, without rupture: Secondary | ICD-10-CM

## 2017-03-30 DIAGNOSIS — I351 Nonrheumatic aortic (valve) insufficiency: Secondary | ICD-10-CM

## 2017-03-30 DIAGNOSIS — I339 Acute and subacute endocarditis, unspecified: Secondary | ICD-10-CM

## 2017-03-30 LAB — BASIC METABOLIC PANEL
Anion gap: 5 (ref 5–15)
BUN: 15 mg/dL (ref 6–20)
CO2: 25 mmol/L (ref 22–32)
Calcium: 8.5 mg/dL — ABNORMAL LOW (ref 8.9–10.3)
Chloride: 103 mmol/L (ref 101–111)
Creatinine, Ser: 1 mg/dL (ref 0.61–1.24)
GFR calc Af Amer: >60 mL/min (ref >60)
GFR calc non Af Amer: >60 mL/min (ref >60)
Glucose, Bld: 100 mg/dL — ABNORMAL HIGH (ref 65–99)
Potassium: 4.2 mmol/L (ref 3.5–5.1)
Sodium: 133 mmol/L — ABNORMAL LOW (ref 135–145)

## 2017-03-30 LAB — CBC
HCT: 33.3 % — ABNORMAL LOW (ref 39.0–52.0)
Hemoglobin: 10.6 g/dL — ABNORMAL LOW (ref 13.0–17.0)
MCH: 25.3 pg — ABNORMAL LOW (ref 26.0–34.0)
MCHC: 31.8 g/dL (ref 30.0–36.0)
MCV: 79.5 fL (ref 78.0–100.0)
Platelets: 224 10*3/uL (ref 150–400)
RBC: 4.19 MIL/uL — ABNORMAL LOW (ref 4.22–5.81)
RDW: 18.2 % — ABNORMAL HIGH (ref 11.5–15.5)
WBC: 8.9 10*3/uL (ref 4.0–10.5)

## 2017-03-30 LAB — BLOOD CULTURE ID PANEL (REFLEXED)

## 2017-03-30 LAB — LIPID PANEL
Cholesterol: 165 mg/dL (ref 0–200)
HDL: 24 mg/dL — ABNORMAL LOW (ref >40)
LDL Cholesterol: 123 mg/dL — ABNORMAL HIGH (ref 0–99)
Total CHOL/HDL Ratio: 6.9 RATIO
Triglycerides: 91 mg/dL (ref <150)
VLDL: 18 mg/dL (ref 0–40)

## 2017-03-30 LAB — HIV ANTIBODY (ROUTINE TESTING W REFLEX): HIV Screen 4th Generation wRfx: NONREACTIVE

## 2017-03-30 NOTE — Progress Notes (Signed)
Nutrition Brief Note  Patient identified on the Malnutrition Screening Tool (MST) Report  Wt Readings from Last 15 Encounters:  03/30/17 186 lb (84.4 kg)  03/28/17 194 lb (88 kg)  03/05/17 198 lb (89.8 kg)  01/10/17 198 lb 6.4 oz (90 kg)  12/27/16 201 lb (91.2 kg)  03/19/16 215 lb 3.2 oz (97.6 kg)  01/18/16 205 lb (93 kg)  03/14/15 216 lb (98 kg)  03/19/14 210 lb (95.3 kg)  10/16/13 211 lb (95.7 kg)  10/01/13 209 lb 9.6 oz (95.1 kg)  09/28/13 210 lb 12.8 oz (95.6 kg)  09/11/13 203 lb (92.1 kg)  03/30/13 211 lb (95.7 kg)  02/11/13 210 lb 12.8 oz (95.6 kg)    Body mass index is 25.94 kg/m. Patient meets criteria for overweight based on current BMI. Skin WDL. Pt admitted for endocarditis. He reports that ~1 year ago he was having back problems/pain and was on pain medication which caused him to have a decrease in appetite. Pt is unsure when this resolved but states that he has not had a problem with appetite in awhile. Visualized lunch tray with 100% completion (692 kcal, 29 grams of protein). Current diet order is Heart Healthy. Pt states that 1 year ago he weighed 215-220 lbs and that he has lost ~20 lbs in the past 1 year. He reports stable weight over the past few months. Noticed weight change above from 11/29-12/1 and unsure of rationale for discrepancy as pt does not feel that weight has changed during that time frame.   Labs and medications reviewed. Ensure Enlive ordered BID per ONS protocol at time of admission. Each supplement provides 350 kcal, 20 grams of protein. Pt is fine with continuing this order. No additional nutrition interventions warranted at this time. If nutrition issues arise, please consult RD.     Jarome Matin, MS, RD, LDN, [facility] Inpatient Clinical Dietitian Pager # [phone] After hours/weekend pager # [phone]

## 2017-03-30 NOTE — Progress Notes (Signed)
PHARMACY - PHYSICIAN COMMUNICATION CRITICAL VALUE ALERT - BLOOD CULTURE IDENTIFICATION (BCID)  [name] Anderson is an 61 y.o. male who presented to [facility] on 03/29/2017 with a chief complaint of subacute endocarditis.  Name of physician (or Provider) Contacted: Cardiology   Current antibiotics: Unasyn and vancomycin   Changes to prescribed antibiotics recommended:  Continue current antibiotics for tonight; ID will see again on 12/2  Results for orders placed or performed during the hospital encounter of 03/29/17  Blood Culture ID Panel (Reflexed) (Collected: 03/29/2017 12:32 PM)  Result Value Ref Range   Enterococcus species NOT DETECTED NOT DETECTED   Listeria monocytogenes NOT DETECTED NOT DETECTED   Staphylococcus species DETECTED (A) NOT DETECTED   Staphylococcus aureus NOT DETECTED NOT DETECTED   Methicillin resistance NOT DETECTED NOT DETECTED   Streptococcus species NOT DETECTED NOT DETECTED   Streptococcus agalactiae NOT DETECTED NOT DETECTED   Streptococcus pneumoniae NOT DETECTED NOT DETECTED   Streptococcus pyogenes NOT DETECTED NOT DETECTED   Acinetobacter baumannii NOT DETECTED NOT DETECTED   Enterobacteriaceae species NOT DETECTED NOT DETECTED   Enterobacter cloacae complex NOT DETECTED NOT DETECTED   Escherichia coli NOT DETECTED NOT DETECTED   Klebsiella oxytoca NOT DETECTED NOT DETECTED   Klebsiella pneumoniae NOT DETECTED NOT DETECTED   Proteus species NOT DETECTED NOT DETECTED   Serratia marcescens NOT DETECTED NOT DETECTED   Haemophilus influenzae NOT DETECTED NOT DETECTED   Neisseria meningitidis NOT DETECTED NOT DETECTED   Pseudomonas aeruginosa NOT DETECTED NOT DETECTED   Candida albicans NOT DETECTED NOT DETECTED   Candida glabrata NOT DETECTED NOT DETECTED   Candida krusei NOT DETECTED NOT DETECTED   Candida parapsilosis NOT DETECTED NOT DETECTED   Candida tropicalis NOT DETECTED NOT DETECTED    Vincenza Hews, PharmD, BCPS 03/30/2017, 9:05  PM

## 2017-03-30 NOTE — Progress Notes (Signed)
Progress Note  Patient Name: [name] Anderson Date of Encounter: 03/30/2017  Primary Cardiologist: Stanford Breed  Subjective   Feels well, just nonspecific malaise.  Inpatient Medications    Scheduled Meds: . aspirin  81 mg Oral Daily  . aspirin  81 mg Oral Pre-Cath  . atorvastatin  10 mg Oral q1800  . feeding supplement (ENSURE ENLIVE)  237 mL Oral BID BM  . ferrous sulfate  325 mg Oral Daily  . heparin  5,000 Units Subcutaneous Q8H  . losartan  50 mg Oral Daily  . sodium chloride flush  3 mL Intravenous Q12H  . sodium chloride flush  3 mL Intravenous Q12H  . vitamin C  1,000 mg Oral Daily   Continuous Infusions: . sodium chloride    . sodium chloride 250 mL (03/29/17 2121)  . sodium chloride 1 mL/kg/hr (03/29/17 2122)  . ampicillin-sulbactam (UNASYN) IV 3 g (03/30/17 0923)  . vancomycin Stopped (03/30/17 0722)   PRN Meds: sodium chloride, sodium chloride, acetaminophen, sodium chloride flush, sodium chloride flush   Vital Signs    Vitals:   03/29/17 1145 03/29/17 1441 03/29/17 2151 03/30/17 0556  BP: (!) 117/50 (!) 126/46 122/68 (!) 122/53  Pulse:  90 88 83  Resp: (!) 28  19 18   Temp:  98.9 F (37.2 C) 99.4 F (37.4 C) 99.2 F (37.3 C)  TempSrc:  Oral Oral Oral  SpO2: 95% 98% 96% 96%  Weight:  187 lb 12.8 oz (85.2 kg)  186 lb (84.4 kg)  Height:  5\' 11"  (1.803 m)      Intake/Output Summary (Last 24 hours) at 03/30/2017 1019 Last data filed at 03/29/2017 1803 Gross per 24 hour  Intake 240 ml  Output -  Net 240 ml   Filed Weights   03/29/17 1008 03/29/17 1441 03/30/17 0556  Weight: 194 lb (88 kg) 187 lb 12.8 oz (85.2 kg) 186 lb (84.4 kg)    Telemetry    Sinus rhythm- Personally Reviewed  ECG    No new tracing- Personally Reviewed  Physical Exam  Is comfortable GEN: No acute distress.   Neck: No JVD Cardiac: RRR, no murmurs, rubs, or gallops.  2/6 early peaking aortic ejection murmur, 1/6 decrescendo faint diastolic aortic murmur Respiratory: Clear  to auscultation bilaterally. GI: Soft, nontender, non-distended  MS: No edema; No deformity. Neuro:  Nonfocal  Psych: Normal affect   Labs    Chemistry Recent Labs  Lab 03/28/17 0907 03/29/17 1250 03/30/17 0353  NA 138 135 133*  K 5.0 4.5 4.2  CL 101 105 103  CO2 26 26 25   GLUCOSE 101* 90 100*  BUN 17 13 15   CREATININE 0.90 0.91 1.00  CALCIUM 9.3 8.7* 8.5*  PROT  --  7.2  --   ALBUMIN  --  3.1*  --   AST  --  19  --   ALT  --  13*  --   ALKPHOS  --  60  --   BILITOT  --  0.7  --   GFRNONAA 92 >60 >60  GFRAA 106 >60 >60  ANIONGAP  --  4* 5     Hematology Recent Labs  Lab 03/28/17 0907 03/29/17 1250 03/30/17 0353  WBC 9.3 8.2 8.9  RBC 4.46 4.31 4.19*  HGB 11.3* 11.0* 10.6*  HCT 34.8* 34.8* 33.3*  MCV 78* 80.7 79.5  MCH 25.3* 25.5* 25.3*  MCHC 32.5 31.6 31.8  RDW 17.5* 18.4* 18.2*  PLT 268 234 224     Radiology  X-ray Chest Pa And Lateral  Result Date: 03/29/2017 CLINICAL DATA:  Endocarditis EXAM: CHEST  2 VIEW COMPARISON:  CTA chest dated 03/19/2017 FINDINGS: Lungs are clear.  No pleural effusion or pneumothorax. The heart is normal in size. Visualized osseous structures are within normal limits. IMPRESSION: No evidence of acute cardiopulmonary disease. Electronically Signed   By: Julian Hy M.D.   On: 03/29/2017 15:07    Cardiac Studies   Echocardiogram 03/19/17 Study Conclusions - Left ventricle: The cavity size was severely dilated. Wall thickness was normal. Systolic function was normal. The estimated ejection fraction was in the range of 50% to 55%. Wall motion was normal; there were no regional wall motion abnormalities. Doppler parameters are consistent with abnormal left ventricular relaxation (grade 1 diastolic dysfunction). There was no evidence of elevated ventricular filling pressure by Doppler parameters. - Aortic valve: There was severe regurgitation. - Aortic root: The aortic root was moderately dilated measuring  46 mm. - Ascending aorta: The ascending aorta was upper normal size measuring 40 mm. - Mitral valve: There was mild regurgitation. - Left atrium: The atrium was mildly dilated. - Right ventricle: The cavity size was normal. Wall thickness was normal. Systolic function was normal. - Right atrium: The atrium was normal in size. - Inferior vena cava: The vessel was normal in size. - Pericardium, extracardiac: There was no pericardial effusion.  Impressions: - When compared to the prior study from 04/13/2016 there are significant changes. LV size has increased from normal to moderately to severely dilated. LVEF has decreased from 60-65% to 50-55%. Aortic root remains moderately dilated with now severe aortic regurgitation. There is partially flail or redundant chordae of the anterior mitral valve leaflet with no significant mitral regurgitation.  CT surgery consult should be considered.  TEE 03/29/2017  - Left ventricle: Systolic function was normal. The estimated   ejection fraction was in the range of 55% to 60%. Wall motion was   normal; there were no regional wall motion abnormalities. - Aortic valve: There was a small vegetation on the left   ventricular aspect of the left coronary cusp. There was severe   regurgitation. - Mitral valve: No evidence of vegetation. There was mild   regurgitation. - Right atrium: No evidence of thrombus in the atrial cavity or   appendage. - Tricuspid valve: No evidence of vegetation. - Pulmonic valve: No evidence of vegetation.  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     Patient Profile     61 y.o. male with long-standing aortic insufficiency,  aortic root dilation, now severe aortic insufficiency due to superimposed endocarditis with leaflet perforation and evidence of left ventricular dysfunction, probable vegetation also on mitral valve.  Additional problems include HTN,  hyperlipidemia, history of testicular cancer and mild obstructive sleep apnea.  Assessment & Plan    1. Endocarditis: On empirical antibiotics.  Blood cultures drawn yesterday, multiple sets.  ID consultation is appreciated.  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  There is clear vegetation and perforation of the aortic cusp, I believe there is also a large vegetation on the mitral valve chordal apparatus ("jet injury" location).  Thankfully the mitral valve is competent and will probably not require surgery.  Splenic infarcts  are questioned on CT.  This would be consistent with endocarditis although he has not had pain.  2. Severe AI: He will require aortic valve replacement following sterilization of blood cultures.  Also needs aortic root repair.  Will need coronary angiography, the due to the risk of embolization of the aortic vegetation, it may be best to delay heart catheterization until just before planned cardiac surgery.  He is hemodynamically well compensated and I do not see reason to perform urgent cardiac surgery, other than reducing the risk of embolization.  Will discuss with Dr. Servando Snare.  Reviewed briefly with Mr. Mrs. Levan the option of mechanical versus bioprosthetic aortic valve and the relative pros and cons of these.  At his age, either approach might be reasonable. 3. HTN:  Controlled.  Note very broad pulse pressure consistent with severe aortic insufficiency, also suggesting some degree of chronicity.   For questions or updates, please contact [facility] Please consult [email or web address] for contact info under Cardiology/STEMI.      Signed, Sanda Klein, MD  03/30/2017, 10:19 AM

## 2017-03-30 NOTE — Progress Notes (Signed)
    [facility] for Infectious Disease   Reason for visit: Follow up on subacute endocarditis  Interval History: blood cultures all remain negative to date; no fever, normal wbc; discussing care with Dr. Servando Snare  Physical Exam: Constitutional:  Vitals:   03/30/17 0556 03/30/17 1347  BP: (!) 122/53 (!) 127/51  Pulse: 83 87  Resp: 18   Temp: 99.2 F (37.3 C) 98.3 F (36.8 C)  SpO2: 96% 100%   patient appears in NAD   Review of Systems: Unable to discuss  Lab Results  Component Value Date   WBC 8.9 03/30/2017   HGB 10.6 (L) 03/30/2017   HCT 33.3 (L) 03/30/2017   MCV 79.5 03/30/2017   PLT 224 03/30/2017    Lab Results  Component Value Date   CREATININE 1.00 03/30/2017   BUN 15 03/30/2017   NA 133 (L) 03/30/2017   K 4.2 03/30/2017   CL 103 03/30/2017   CO2 25 03/30/2017    Lab Results  Component Value Date   ALT 13 (L) 03/29/2017   AST 19 03/29/2017   ALKPHOS 60 03/29/2017     Microbiology: Recent Results (from the past 240 hour(s))  Culture, blood (routine x 2)     Status: None (Preliminary result)   Collection Time: 03/29/17 12:30 PM  Result Value Ref Range Status   Specimen Description BLOOD RIGHT ANTECUBITAL  Final   Special Requests   Final    BOTTLES DRAWN AEROBIC ONLY Blood Culture adequate volume   Culture NO GROWTH < 24 HOURS  Final   Report Status PENDING  Incomplete  Culture, blood (routine x 2)     Status: None (Preliminary result)   Collection Time: 03/29/17 12:32 PM  Result Value Ref Range Status   Specimen Description BLOOD LEFT HAND  Final   Special Requests   Final    BOTTLES DRAWN AEROBIC ONLY Blood Culture adequate volume   Culture NO GROWTH < 24 HOURS  Final   Report Status PENDING  Incomplete  Culture, blood (routine x 2)     Status: None (Preliminary result)   Collection Time: 03/29/17 12:35 PM  Result Value Ref Range Status   Specimen Description BLOOD RIGHT HAND  Final   Special Requests   Final    BOTTLES DRAWN AEROBIC  ONLY Blood Culture adequate volume   Culture NO GROWTH < 24 HOURS  Final   Report Status PENDING  Incomplete  Culture, blood (routine x 2)     Status: None (Preliminary result)   Collection Time: 03/29/17  9:17 PM  Result Value Ref Range Status   Specimen Description BLOOD LEFT ANTECUBITAL  Final   Special Requests Blood Culture adequate volume IN PEDIATRIC BOTTLE  Final   Culture NO GROWTH < 24 HOURS  Final   Report Status PENDING  Incomplete  Culture, blood (routine x 2)     Status: None (Preliminary result)   Collection Time: 03/29/17  9:21 PM  Result Value Ref Range Status   Specimen Description BLOOD LEFT HAND  Final   Special Requests Blood Culture adequate volume IN PEDIATRIC BOTTLE  Final   Culture NO GROWTH < 24 HOURS  Final   Report Status PENDING  Incomplete    Impression/Plan:  1. Endocarditis - culture negative so far.  Other work up for culture negative endocarditis underway.  Continue with vancomycin and amp/sulbactam.  2. Screening - HIV negative.  Hepatitis C pending.

## 2017-03-31 DIAGNOSIS — B9561 Methicillin susceptible Staphylococcus aureus infection as the cause of diseases classified elsewhere: Secondary | ICD-10-CM

## 2017-03-31 DIAGNOSIS — I330 Acute and subacute infective endocarditis: Principal | ICD-10-CM

## 2017-03-31 DIAGNOSIS — I519 Heart disease, unspecified: Secondary | ICD-10-CM

## 2017-03-31 LAB — VANCOMYCIN, TROUGH: Vancomycin Tr: 22 ug/mL (ref 15–20)

## 2017-03-31 MED ORDER — CEFAZOLIN SODIUM-DEXTROSE 2-4 GM/100ML-% IV SOLN
2.0000 g | Freq: Three times a day (TID) | INTRAVENOUS | Status: DC
  Administered 2017-03-31 – 2017-04-03 (×10): 2 g via INTRAVENOUS
  Filled 2017-03-31 (×11): qty 100

## 2017-03-31 NOTE — Progress Notes (Signed)
Spoke with Dr Sallyanne Kuster re Dr Linus Salmons note to place PICC once repeat [facility] are negative x 48 hrs.    New order to cancel PICC order for now.

## 2017-03-31 NOTE — Progress Notes (Signed)
Pharmacy Antibiotic Note  [name] Anderson is a 61 y.o. male admitted on 03/29/2017 with endocarditis.  Pharmacy has been consulted for Vancocin and Unasyn dosing.  Vanc trough appear above goal but last dose given late and trough extrapolates closer to 16, at goal.  Plan: This patient's current antibiotics will be continued without adjustments.  Height: 5\' 11"  (180.3 cm) Weight: 185 lb 1.6 oz (84 kg) IBW/kg (Calculated) : 75.3  Temp (24hrs), Avg:98.1 F (36.7 C), Min:98 F (36.7 C), Max:98.3 F (36.8 C)  Recent Labs  Lab 03/28/17 0907 03/29/17 1250 03/30/17 0353 03/31/17 0512  WBC 9.3 8.2 8.9  --   CREATININE 0.90 0.91 1.00  --   VANCOTROUGH  --   --   --  22*    Estimated Creatinine Clearance: 82.6 mL/min (by C-G formula based on SCr of 1 mg/dL).    No Known Allergies  Antimicrobials this admission: Vanc 11/30>> Unasyn 11/30>>  Microbiology results: 11/30 1200 blood x 3 - staph spp 11/30 2000 blood x 2 - ng < 24 hrs so far 11/30 HIV - non-reactive  Thank you for allowing pharmacy to be a part of this patient's care.  Wynona Neat, PharmD, BCPS  03/31/2017 7:02 AM

## 2017-03-31 NOTE — Progress Notes (Signed)
[facility] for Infectious Disease   Reason for visit: Follow up on infective endocarditis  Interval History: now growing GPC in multiple blood cultures, Staph non-aureus on BCID, not methicillin resistant; WBC wnl, feels well with no complaints Day 3 antibiotics Day 1 cefazolin  Physical Exam: Constitutional:  Vitals:   03/30/17 2104 03/31/17 0540  BP: 115/64 (!) 111/59  Pulse: 80 79  Resp: 18 16  Temp: 98 F (36.7 C) 98.1 F (36.7 C)  SpO2: 98% 99%   patient appears in NAD Respiratory: Normal respiratory effort; CTA B Cardiovascular: RRR + SEM GI: soft, nt, nd  Review of Systems: Constitutional: negative for fevers, chills and fatigue Respiratory: negative for cough Gastrointestinal: negative for diarrhea  Lab Results  Component Value Date   WBC 8.9 03/30/2017   HGB 10.6 (L) 03/30/2017   HCT 33.3 (L) 03/30/2017   MCV 79.5 03/30/2017   PLT 224 03/30/2017    Lab Results  Component Value Date   CREATININE 1.00 03/30/2017   BUN 15 03/30/2017   NA 133 (L) 03/30/2017   K 4.2 03/30/2017   CL 103 03/30/2017   CO2 25 03/30/2017    Lab Results  Component Value Date   ALT 13 (L) 03/29/2017   AST 19 03/29/2017   ALKPHOS 60 03/29/2017     Microbiology: Recent Results (from the past 240 hour(s))  Culture, blood (routine x 2)     Status: None (Preliminary result)   Collection Time: 03/29/17 12:30 PM  Result Value Ref Range Status   Specimen Description BLOOD RIGHT ANTECUBITAL  Final   Special Requests   Final    BOTTLES DRAWN AEROBIC ONLY Blood Culture adequate volume   Culture  Setup Time   Final    GRAM POSITIVE COCCI IN CLUSTERS AEROBIC BOTTLE ONLY CRITICAL VALUE NOTED.  VALUE IS CONSISTENT WITH PREVIOUSLY REPORTED AND CALLED VALUE.    Culture GRAM POSITIVE COCCI  Final   Report Status PENDING  Incomplete  Culture, blood (routine x 2)     Status: None (Preliminary result)   Collection Time: 03/29/17 12:32 PM  Result Value Ref Range Status   Specimen Description BLOOD LEFT HAND  Final   Special Requests   Final    BOTTLES DRAWN AEROBIC ONLY Blood Culture adequate volume   Culture  Setup Time   Final    GRAM POSITIVE COCCI IN CLUSTERS AEROBIC BOTTLE ONLY CRITICAL RESULT CALLED TO, READ BACK BY AND VERIFIED WITH: A. JOHNSTON,PHARMD 2056 03/30/2017 T. TYSOR    Culture GRAM POSITIVE COCCI  Final   Report Status PENDING  Incomplete  Blood Culture ID Panel (Reflexed)     Status: Abnormal   Collection Time: 03/29/17 12:32 PM  Result Value Ref Range Status   Enterococcus species NOT DETECTED NOT DETECTED Final   Listeria monocytogenes NOT DETECTED NOT DETECTED Final   Staphylococcus species DETECTED (A) NOT DETECTED Final    Comment: Methicillin (oxacillin) susceptible coagulase negative staphylococcus. Possible blood culture contaminant (unless isolated from more than one blood culture draw or clinical case suggests pathogenicity). No antibiotic treatment is indicated for blood  culture contaminants. CRITICAL RESULT CALLED TO, READ BACK BY AND VERIFIED WITH: A. JOHNSTON,PHARMD 2056 03/30/2017 T. TYSOR    Staphylococcus aureus NOT DETECTED NOT DETECTED Final   Methicillin resistance NOT DETECTED NOT DETECTED Final   Streptococcus species NOT DETECTED NOT DETECTED Final   Streptococcus agalactiae NOT DETECTED NOT DETECTED Final   Streptococcus pneumoniae NOT DETECTED NOT DETECTED Final   Streptococcus  pyogenes NOT DETECTED NOT DETECTED Final   Acinetobacter baumannii NOT DETECTED NOT DETECTED Final   Enterobacteriaceae species NOT DETECTED NOT DETECTED Final   Enterobacter cloacae complex NOT DETECTED NOT DETECTED Final   Escherichia coli NOT DETECTED NOT DETECTED Final   Klebsiella oxytoca NOT DETECTED NOT DETECTED Final   Klebsiella pneumoniae NOT DETECTED NOT DETECTED Final   Proteus species NOT DETECTED NOT DETECTED Final   Serratia marcescens NOT DETECTED NOT DETECTED Final   Haemophilus influenzae NOT DETECTED NOT  DETECTED Final   Neisseria meningitidis NOT DETECTED NOT DETECTED Final   Pseudomonas aeruginosa NOT DETECTED NOT DETECTED Final   Candida albicans NOT DETECTED NOT DETECTED Final   Candida glabrata NOT DETECTED NOT DETECTED Final   Candida krusei NOT DETECTED NOT DETECTED Final   Candida parapsilosis NOT DETECTED NOT DETECTED Final   Candida tropicalis NOT DETECTED NOT DETECTED Final  Culture, blood (routine x 2)     Status: None (Preliminary result)   Collection Time: 03/29/17 12:35 PM  Result Value Ref Range Status   Specimen Description BLOOD RIGHT HAND  Final   Special Requests   Final    BOTTLES DRAWN AEROBIC ONLY Blood Culture adequate volume   Culture  Setup Time   Final    GRAM POSITIVE COCCI IN CLUSTERS AEROBIC BOTTLE ONLY CRITICAL VALUE NOTED.  VALUE IS CONSISTENT WITH PREVIOUSLY REPORTED AND CALLED VALUE.    Culture CULTURE REINCUBATED FOR BETTER GROWTH  Final   Report Status PENDING  Incomplete  Culture, blood (routine x 2)     Status: None (Preliminary result)   Collection Time: 03/29/17  9:17 PM  Result Value Ref Range Status   Specimen Description BLOOD LEFT ANTECUBITAL  Final   Special Requests Blood Culture adequate volume IN PEDIATRIC BOTTLE  Final   Culture  Setup Time   Final    GRAM POSITIVE COCCI IN PEDIATRIC BOTTLE CRITICAL VALUE NOTED.  VALUE IS CONSISTENT WITH PREVIOUSLY REPORTED AND CALLED VALUE.    Culture GRAM POSITIVE COCCI  Final   Report Status PENDING  Incomplete  Culture, blood (routine x 2)     Status: None (Preliminary result)   Collection Time: 03/29/17  9:21 PM  Result Value Ref Range Status   Specimen Description BLOOD LEFT HAND  Final   Special Requests Blood Culture adequate volume IN PEDIATRIC BOTTLE  Final   Culture  Setup Time   Final    GRAM POSITIVE COCCI IN CLUSTERS IN PEDIATRIC BOTTLE CRITICAL VALUE NOTED.  VALUE IS CONSISTENT WITH PREVIOUSLY REPORTED AND CALLED VALUE.    Culture NO GROWTH < 24 HOURS  Final   Report Status  PENDING  Incomplete    Impression/Plan:  1. Infective endocarditis with CoNS, methicillin sensitive - will use cefazolin for 6 week course. Repeat blood cultures tomorrow  2.  Access - will need picc line once blood cultures show clearance of bacteremia  3.  Medication monitoring - creat remains wnl.  Will continue to monitor  Dr Johnnye Sima on tomorrow

## 2017-03-31 NOTE — Progress Notes (Signed)
Progress Note  Patient Name: [name] [place] Date of Encounter: 03/31/2017  Primary Cardiologist: Stanford Breed  Subjective   No change in symptoms. He denies any dyspnea at rest or with light activity. Blood cultures growing gram-positive cocci in clusters in all samples, methicillin sensitive staph aureus  Inpatient Medications    Scheduled Meds: . aspirin  81 mg Oral Daily  . atorvastatin  10 mg Oral q1800  . feeding supplement (ENSURE ENLIVE)  237 mL Oral BID BM  . ferrous sulfate  325 mg Oral Daily  . heparin  5,000 Units Subcutaneous Q8H  . losartan  50 mg Oral Daily  . sodium chloride flush  3 mL Intravenous Q12H  . sodium chloride flush  3 mL Intravenous Q12H  . vitamin C  1,000 mg Oral Daily   Continuous Infusions: . sodium chloride    . sodium chloride 250 mL (03/29/17 2121)  . sodium chloride 1 mL/kg/hr (03/29/17 2122)  . ampicillin-sulbactam (UNASYN) IV 3 g (03/31/17 0942)  . vancomycin 1,000 mg (03/31/17 0601)   PRN Meds: sodium chloride, sodium chloride, acetaminophen, sodium chloride flush, sodium chloride flush   Vital Signs    Vitals:   03/30/17 0556 03/30/17 1347 03/30/17 2104 03/31/17 0540  BP: (!) 122/53 (!) 127/51 115/64 (!) 111/59  Pulse: 83 87 80 79  Resp: 18  18 16   Temp: 99.2 F (37.3 C) 98.3 F (36.8 C) 98 F (36.7 C) 98.1 F (36.7 C)  TempSrc: Oral Oral Oral Oral  SpO2: 96% 100% 98% 99%  Weight: 186 lb (84.4 kg)   185 lb 1.6 oz (84 kg)  Height:        Intake/Output Summary (Last 24 hours) at 03/31/2017 0958 Last data filed at 03/31/2017 0300 Gross per 24 hour  Intake 4204.23 ml  Output -  Net 4204.23 ml   Filed Weights   03/29/17 1441 03/30/17 0556 03/31/17 0540  Weight: 187 lb 12.8 oz (85.2 kg) 186 lb (84.4 kg) 185 lb 1.6 oz (84 kg)    Telemetry    Normal sinus rhythm, not tachycardic - Personally Reviewed  ECG    No new tracing - Personally Reviewed  Physical Exam  Looks comfortable GEN: No acute distress.   Neck: No  JVD Cardiac: RRR, 3/6 aortic ejection murmur, 1-2/6 aortic decrescendo diastolic murmur, rubs, or gallops.  Respiratory: Clear to auscultation bilaterally. GI: Soft, nontender, non-distended  MS: No edema; No deformity. Neuro:  Nonfocal  Psych: Normal affect   Labs    Chemistry Recent Labs  Lab 03/28/17 0907 03/29/17 1250 03/30/17 0353  NA 138 135 133*  K 5.0 4.5 4.2  CL 101 105 103  CO2 26 26 25   GLUCOSE 101* 90 100*  BUN 17 13 15   CREATININE 0.90 0.91 1.00  CALCIUM 9.3 8.7* 8.5*  PROT  --  7.2  --   ALBUMIN  --  3.1*  --   AST  --  19  --   ALT  --  13*  --   ALKPHOS  --  60  --   BILITOT  --  0.7  --   GFRNONAA 92 >60 >60  GFRAA 106 >60 >60  ANIONGAP  --  4* 5     Hematology Recent Labs  Lab 03/28/17 0907 03/29/17 1250 03/30/17 0353  WBC 9.3 8.2 8.9  RBC 4.46 4.31 4.19*  HGB 11.3* 11.0* 10.6*  HCT 34.8* 34.8* 33.3*  MCV 78* 80.7 79.5  MCH 25.3* 25.5* 25.3*  MCHC 32.5  31.6 31.8  RDW 17.5* 18.4* 18.2*  PLT 268 234 224    Cardiac EnzymesNo results for input(s): TROPONINI in the last 168 hours. No results for input(s): TROPIPOC in the last 168 hours.   BNPNo results for input(s): BNP, PROBNP in the last 168 hours.   DDimer No results for input(s): DDIMER in the last 168 hours.   Radiology    X-ray Chest Pa And Lateral  Result Date: 03/29/2017 CLINICAL DATA:  Endocarditis EXAM: CHEST  2 VIEW COMPARISON:  CTA chest dated 03/19/2017 FINDINGS: Lungs are clear.  No pleural effusion or pneumothorax. The heart is normal in size. Visualized osseous structures are within normal limits. IMPRESSION: No evidence of acute cardiopulmonary disease. Electronically Signed   By: Julian Hy M.D.   On: 03/29/2017 15:07    Cardiac Studies    Echocardiogram 03/19/17 Study Conclusions - Left ventricle: The cavity size was severely dilated. Wall thickness was normal. Systolic function was normal. The estimated ejection fraction was in the range of 50% to  55%. Wall motion was normal; there were no regional wall motion abnormalities. Doppler parameters are consistent with abnormal left ventricular relaxation (grade 1 diastolic dysfunction). There was no evidence of elevated ventricular filling pressure by Doppler parameters. - Aortic valve: There was severe regurgitation. - Aortic root: The aortic root was moderately dilated measuring 46 mm. - Ascending aorta: The ascending aorta was upper normal size measuring 40 mm. - Mitral valve: There was mild regurgitation. - Left atrium: The atrium was mildly dilated. - Right ventricle: The cavity size was normal. Wall thickness was normal. Systolic function was normal. - Right atrium: The atrium was normal in size. - Inferior vena cava: The vessel was normal in size. - Pericardium, extracardiac: There was no pericardial effusion.  Impressions: - When compared to the prior study from 04/13/2016 there are significant changes. LV size has increased from normal to moderately to severely dilated. LVEF has decreased from 60-65% to 50-55%. Aortic root remains moderately dilated with now severe aortic regurgitation. There is partially flail or redundant chordae of the anterior mitral valve leaflet with no significant mitral regurgitation.  CT surgery consult should be considered.  TEE 03/29/2017  - Left ventricle: Systolic function was normal. The estimated ejection fraction was in the range of 55% to 60%. Wall motion was normal; there were no regional wall motion abnormalities. - Aortic valve: There was a small vegetation on the left ventricular aspect of the left coronary cusp. There was severe regurgitation. - Mitral valve: No evidence of vegetation. There was mild regurgitation. - Right atrium: No evidence of thrombus in the atrial cavity or appendage. - Tricuspid valve: No evidence of vegetation. - Pulmonic valve: No evidence of  vegetation.  Impressions:  - Normal LV function; oscillating density on left coronary cusp with possible perforation and severe eccentric AI (findings concerning for SBE); mildly dilated aortic root (4.2 cm); mildly thickend MV with redundant MV chord (cannot R/O associated vegetation); mild MR and TR. Patient Profile     61 y.o. male with long-standing aortic insufficiency due to aortic root dilation, presenting with severe aortic insufficiency due to superimposed MSSA endocarditis with perforation of of leaflet as well as evidence of newasymptomatic left ventricular systolic dysfunction. May also have a vegetation on the mitral subvalvular apparatus, without significant mitral valve dysfunction. Additional problems include HTN,  hyperlipidemia, history of testicular cancer and mild obstructive sleep apnea. Assessment & Plan    1. Endocarditis: Will place PICC line.  There is  clear vegetation and perforation of the aortic cusp, I believe there is also a large vegetation on the mitral valve chordal apparatus ("jet injury" location).  Thankfully the mitral valve is competent and will probably not require surgery.  ID to decide on precise ABx prescription. 2. Severe AI: He will require aortic valve replacement following sterilization of blood cultures.  Also needs aortic root repair.  Will need coronary angiography, the due to the risk of embolization of the aortic vegetation, it may be best to delay heart catheterization until just before planned cardiac surgery.  He is hemodynamically well compensated and I do not see reason to perform urgent cardiac surgery, other than reducing the risk of embolization.  Will discuss with Dr. Servando Snare.  Reviewed briefly with Mr. Mrs. Brucato the option of mechanical versus bioprosthetic aortic valve and the relative pros and cons of these.  At his age, either approach might be reasonable. 3. HTN:  Controlled.  Note very broad pulse pressure consistent with  severe aortic insufficiency, also suggesting some degree of chronicity.    For questions or updates, please contact [place] Please consult [email or web address] for contact info under Cardiology/STEMI.      Signed, Sanda Klein, MD  03/31/2017, 9:58 AM

## 2017-04-01 DIAGNOSIS — R5383 Other fatigue: Secondary | ICD-10-CM

## 2017-04-01 DIAGNOSIS — R6883 Chills (without fever): Secondary | ICD-10-CM

## 2017-04-01 LAB — ANTINUCLEAR ANTIBODIES, IFA: ANA Ab, IFA: NEGATIVE

## 2017-04-01 LAB — BARTONELLA ANITBODY PANEL
B Quintana IgM: NEGATIVE titer
B henselae IgM: NEGATIVE titer

## 2017-04-01 LAB — MISC LABCORP TEST (SEND OUT): Labcorp test code: 16774

## 2017-04-01 LAB — BARTONELLA ANTIBODY PANEL
B henselae IgG: NEGATIVE titer
B quintana IgG: NEGATIVE titer

## 2017-04-01 NOTE — Progress Notes (Signed)
Progress Note  Patient Name: [name] [place] Date of Encounter: 04/01/2017  Primary Cardiologist: Dr Stanford Breed  Subjective   No chest pain or dyspnea  Inpatient Medications    Scheduled Meds: . aspirin  81 mg Oral Daily  . atorvastatin  10 mg Oral q1800  . feeding supplement (ENSURE ENLIVE)  237 mL Oral BID BM  . ferrous sulfate  325 mg Oral Daily  . heparin  5,000 Units Subcutaneous Q8H  . losartan  50 mg Oral Daily  . sodium chloride flush  3 mL Intravenous Q12H  . sodium chloride flush  3 mL Intravenous Q12H  . vitamin C  1,000 mg Oral Daily   Continuous Infusions: . sodium chloride    . sodium chloride 250 mL (03/29/17 2121)  . sodium chloride 1 mL/kg/hr (03/29/17 2122)  .  ceFAZolin (ANCEF) IV Stopped (04/01/17 2878)   PRN Meds: sodium chloride, sodium chloride, acetaminophen, sodium chloride flush, sodium chloride flush   Vital Signs    Vitals:   03/31/17 0540 03/31/17 1300 03/31/17 2121 04/01/17 0618  BP: (!) 111/59 (!) 119/54 (!) 117/57 (!) 133/59  Pulse: 79 85 80 78  Resp: 16 18 18 17   Temp: 98.1 F (36.7 C) 98.4 F (36.9 C) 98 F (36.7 C) 98.1 F (36.7 C)  TempSrc: Oral Oral Oral Oral  SpO2: 99% 99% 99% 100%  Weight: 185 lb 1.6 oz (84 kg)   187 lb 14.4 oz (85.2 kg)  Height:        Intake/Output Summary (Last 24 hours) at 04/01/2017 0817 Last data filed at 03/31/2017 2300 Gross per 24 hour  Intake 1000 ml  Output -  Net 1000 ml   Filed Weights   03/30/17 0556 03/31/17 0540 04/01/17 0618  Weight: 186 lb (84.4 kg) 185 lb 1.6 oz (84 kg) 187 lb 14.4 oz (85.2 kg)    Telemetry    Sinus- Personally Reviewed  Physical Exam   GEN: No acute distress.   Neck: No JVD Cardiac: RRR, 2/6 diastolic murmur Respiratory: Clear to auscultation bilaterally. GI: Soft, nontender, non-distended  MS: No edema; No deformity. Neuro:  Nonfocal  Psych: Normal affect   Labs    Chemistry Recent Labs  Lab 03/28/17 0907 03/29/17 1250 03/30/17 0353  NA 138  135 133*  K 5.0 4.5 4.2  CL 101 105 103  CO2 26 26 25   GLUCOSE 101* 90 100*  BUN 17 13 15   CREATININE 0.90 0.91 1.00  CALCIUM 9.3 8.7* 8.5*  PROT  --  7.2  --   ALBUMIN  --  3.1*  --   AST  --  19  --   ALT  --  13*  --   ALKPHOS  --  60  --   BILITOT  --  0.7  --   GFRNONAA 92 >60 >60  GFRAA 106 >60 >60  ANIONGAP  --  4* 5     Hematology Recent Labs  Lab 03/28/17 0907 03/29/17 1250 03/30/17 0353  WBC 9.3 8.2 8.9  RBC 4.46 4.31 4.19*  HGB 11.3* 11.0* 10.6*  HCT 34.8* 34.8* 33.3*  MCV 78* 80.7 79.5  MCH 25.3* 25.5* 25.3*  MCHC 32.5 31.6 31.8  RDW 17.5* 18.4* 18.2*  PLT 268 234 224      Patient Profile     61 y.o. 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 May also have a vegetation on the mitral subvalvular apparatus, without significant mitral valve dysfunction. Additional problems include HTN,hyperlipidemia, history of testicular cancer and mild obstructive sleep apnea.    Assessment & Plan    1 subacute bacterial endocarditis-blood cultures positive for methicillin sensitive coag neg staph.  Continue cefazolin per ID.  Will ultimately require PICC line for home antibiotics for 6 weeks.  2 severe aortic insufficiency/aortic aneurysm-patient has vegetations noted on aortic valve with perforated cusp.  There is severe aortic insufficiency.  There is possible vegetation on mitral valve cord.  Hemodynamically stable. He will ultimately require aortic valve replacement/aortic root replacement.  Dr. Servando Snare following.  He will need evaluation of his coronaries preoperatively.  Will discuss cardiac gated CT scan versus catheterization.  Will need dental evaluation preoperatively as well.  3 hypertension-blood pressure is controlled.  Continue present  medications.  4 hyperlipidemia-continue statin.  5 normocytic anemia-likely related to endocarditis.  For questions or updates, please contact [place] Please consult [email or web address] for contact info under Cardiology/STEMI.      Signed, Kirk Ruths, MD  04/01/2017, 8:17 AM

## 2017-04-01 NOTE — Progress Notes (Signed)
[facility] for Infectious Disease  Date of Admission:  03/29/2017     Total days of antibiotics 4  Cefazolin 1 day Vancomycin 1 day >> Stopped 12/1 Ampicillin-Sulbactam 3 days >> stopped 12/2          ASSESSMENT/PLAN 61 year old male with previous medical history of aortic insufficiency, hyperlipidemia, testicular cancer and Barrett's esophogus with found to have vegitation on TEE concerning for subacute endocardititis. Blood cultures from 11/30 with Coag negative staph aureas on 3/4 bottles confirming infectious source. Changed from Vancomycin and Unasyn to Cefazolin.   Coag Negative Staph  Endocarditis - Asymptomatic with no fevers. Tolerating antibiotics well. New blood cultures drawn this morning and pending. Sensitivities remain pending. Continue Cefazolin. Insert PICC when blood cultures are clear. Will require 6 weeks of antibiotics. Treatment planned discussed with patient and his wife.   Aortic Insufficiency - Planning for aortic valve replacement pending endocarditis treatment. CVTS following.    Therapeutic drug monitoring  - Kidney function normal. Continue to monitor with clearing vancomycin.   Active Problems:   AVD (aortic valve disease)   Endocarditis determined by echocardiography   . aspirin  81 mg Oral Daily  . atorvastatin  10 mg Oral q1800  . feeding supplement (ENSURE ENLIVE)  237 mL Oral BID BM  . ferrous sulfate  325 mg Oral Daily  . heparin  5,000 Units Subcutaneous Q8H  . losartan  50 mg Oral Daily  . sodium chloride flush  3 mL Intravenous Q12H  . sodium chloride flush  3 mL Intravenous Q12H  . vitamin C  1,000 mg Oral Daily    SUBJECTIVE: Afebrile with no new problems overnight.   No Known Allergies   Review of Systems: Review of Systems  Constitutional: Negative for chills and fever.  Respiratory: Negative for shortness of breath.   Cardiovascular: Negative for chest pain.  Skin: Negative for rash.    OBJECTIVE: Vitals:   03/31/17 0540 03/31/17 1300 03/31/17 2121 04/01/17 0618  BP: (!) 111/59 (!) 119/54 (!) 117/57 (!) 133/59  Pulse: 79 85 80 78  Resp: 16 18 18 17   Temp: 98.1 F (36.7 C) 98.4 F (36.9 C) 98 F (36.7 C) 98.1 F (36.7 C)  TempSrc: Oral Oral Oral Oral  SpO2: 99% 99% 99% 100%  Weight: 185 lb 1.6 oz (84 kg)   187 lb 14.4 oz (85.2 kg)  Height:       Body mass index is 26.21 kg/m.  Physical Exam  Constitutional: He is oriented to person, place, and time and well-developed, well-nourished, and in no distress.  Pleasant; Seated in the chair watching TV.   Cardiovascular: Normal rate, regular rhythm and intact distal pulses. Exam reveals no gallop and no friction rub.  Murmur heard. Pulmonary/Chest: Effort normal and breath sounds normal. No respiratory distress. He has no wheezes. He has no rales. He exhibits no tenderness.  Neurological: He is alert and oriented to person, place, and time.  Skin: Skin is warm and dry. No rash noted. No erythema.  Psychiatric: Mood, memory, affect and judgment normal.    Lab Results Lab Results  Component Value Date   WBC 8.9 03/30/2017   HGB 10.6 (L) 03/30/2017   HCT 33.3 (L) 03/30/2017   MCV 79.5 03/30/2017   PLT 224 03/30/2017    Lab Results  Component Value Date   CREATININE 1.00 03/30/2017   BUN 15 03/30/2017   NA 133 (L) 03/30/2017   K 4.2 03/30/2017   CL 103  03/30/2017   CO2 25 03/30/2017    Lab Results  Component Value Date   ALT 13 (L) 03/29/2017   AST 19 03/29/2017   ALKPHOS 60 03/29/2017   BILITOT 0.7 03/29/2017     Microbiology: Recent Results (from the past 240 hour(s))  Culture, blood (routine x 2)     Status: None (Preliminary result)   Collection Time: 03/29/17 12:30 PM  Result Value Ref Range Status   Specimen Description BLOOD RIGHT ANTECUBITAL  Final   Special Requests   Final    BOTTLES DRAWN AEROBIC ONLY Blood Culture adequate volume   Culture  Setup Time   Final    GRAM POSITIVE COCCI IN CLUSTERS AEROBIC  BOTTLE ONLY CRITICAL VALUE NOTED.  VALUE IS CONSISTENT WITH PREVIOUSLY REPORTED AND CALLED VALUE.    Culture GRAM POSITIVE COCCI  Final   Report Status PENDING  Incomplete  Culture, blood (routine x 2)     Status: Abnormal (Preliminary result)   Collection Time: 03/29/17 12:32 PM  Result Value Ref Range Status   Specimen Description BLOOD LEFT HAND  Final   Special Requests   Final    BOTTLES DRAWN AEROBIC ONLY Blood Culture adequate volume   Culture  Setup Time   Final    GRAM POSITIVE COCCI IN CLUSTERS AEROBIC BOTTLE ONLY CRITICAL RESULT CALLED TO, READ BACK BY AND VERIFIED WITH: A. JOHNSTON,PHARMD 2056 03/30/2017 T. TYSOR    Culture (A)  Final    STAPHYLOCOCCUS SPECIES (COAGULASE NEGATIVE) SUSCEPTIBILITIES TO FOLLOW    Report Status PENDING  Incomplete  Blood Culture ID Panel (Reflexed)     Status: Abnormal   Collection Time: 03/29/17 12:32 PM  Result Value Ref Range Status   Enterococcus species NOT DETECTED NOT DETECTED Final   Listeria monocytogenes NOT DETECTED NOT DETECTED Final   Staphylococcus species DETECTED (A) NOT DETECTED Final    Comment: Methicillin (oxacillin) susceptible coagulase negative staphylococcus. Possible blood culture contaminant (unless isolated from more than one blood culture draw or clinical case suggests pathogenicity). No antibiotic treatment is indicated for blood  culture contaminants. CRITICAL RESULT CALLED TO, READ BACK BY AND VERIFIED WITH: A. JOHNSTON,PHARMD 2056 03/30/2017 T. TYSOR    Staphylococcus aureus NOT DETECTED NOT DETECTED Final   Methicillin resistance NOT DETECTED NOT DETECTED Final   Streptococcus species NOT DETECTED NOT DETECTED Final   Streptococcus agalactiae NOT DETECTED NOT DETECTED Final   Streptococcus pneumoniae NOT DETECTED NOT DETECTED Final   Streptococcus pyogenes NOT DETECTED NOT DETECTED Final   Acinetobacter baumannii NOT DETECTED NOT DETECTED Final   Enterobacteriaceae species NOT DETECTED NOT DETECTED Final    Enterobacter cloacae complex NOT DETECTED NOT DETECTED Final   Escherichia coli NOT DETECTED NOT DETECTED Final   Klebsiella oxytoca NOT DETECTED NOT DETECTED Final   Klebsiella pneumoniae NOT DETECTED NOT DETECTED Final   Proteus species NOT DETECTED NOT DETECTED Final   Serratia marcescens NOT DETECTED NOT DETECTED Final   Haemophilus influenzae NOT DETECTED NOT DETECTED Final   Neisseria meningitidis NOT DETECTED NOT DETECTED Final   Pseudomonas aeruginosa NOT DETECTED NOT DETECTED Final   Candida albicans NOT DETECTED NOT DETECTED Final   Candida glabrata NOT DETECTED NOT DETECTED Final   Candida krusei NOT DETECTED NOT DETECTED Final   Candida parapsilosis NOT DETECTED NOT DETECTED Final   Candida tropicalis NOT DETECTED NOT DETECTED Final  Culture, blood (routine x 2)     Status: None (Preliminary result)   Collection Time: 03/29/17 12:35 PM  Result Value Ref Range  Status   Specimen Description BLOOD RIGHT HAND  Final   Special Requests   Final    BOTTLES DRAWN AEROBIC ONLY Blood Culture adequate volume   Culture  Setup Time   Final    GRAM POSITIVE COCCI IN CLUSTERS AEROBIC BOTTLE ONLY CRITICAL VALUE NOTED.  VALUE IS CONSISTENT WITH PREVIOUSLY REPORTED AND CALLED VALUE.    Culture GRAM POSITIVE COCCI  Final   Report Status PENDING  Incomplete  Culture, blood (routine x 2)     Status: Abnormal (Preliminary result)   Collection Time: 03/29/17  9:17 PM  Result Value Ref Range Status   Specimen Description BLOOD LEFT ANTECUBITAL  Final   Special Requests Blood Culture adequate volume IN PEDIATRIC BOTTLE  Final   Culture  Setup Time   Final    GRAM POSITIVE COCCI IN PEDIATRIC BOTTLE CRITICAL VALUE NOTED.  VALUE IS CONSISTENT WITH PREVIOUSLY REPORTED AND CALLED VALUE.    Culture STAPHYLOCOCCUS SPECIES (COAGULASE NEGATIVE) (A)  Final   Report Status PENDING  Incomplete  Culture, blood (routine x 2)     Status: Abnormal (Preliminary result)   Collection Time: 03/29/17  9:21  PM  Result Value Ref Range Status   Specimen Description BLOOD LEFT HAND  Final   Special Requests Blood Culture adequate volume IN PEDIATRIC BOTTLE  Final   Culture  Setup Time   Final    GRAM POSITIVE COCCI IN CLUSTERS IN PEDIATRIC BOTTLE CRITICAL VALUE NOTED.  VALUE IS CONSISTENT WITH PREVIOUSLY REPORTED AND CALLED VALUE.    Culture STAPHYLOCOCCUS SPECIES (COAGULASE NEGATIVE) (A)  Final   Report Status PENDING  Incomplete     Terri Piedra, NP [facility] [phone] Pager  04/01/2017  11:11 AM

## 2017-04-02 ENCOUNTER — Encounter (HOSPITAL_COMMUNITY)
Admission: AD | Disposition: A | Discharge status: Home Health | Payer: Self-pay | Source: Ambulatory Visit | Attending: Cardiology

## 2017-04-02 ENCOUNTER — Ambulatory Visit (HOSPITAL_COMMUNITY): Admission: RE | Admit: 2017-04-02 | Payer: 59 | Source: Ambulatory Visit | Admitting: Cardiovascular Disease

## 2017-04-02 LAB — CULTURE, BLOOD (ROUTINE X 2)
Special Requests: ADEQUATE
Special Requests: ADEQUATE
Special Requests: ADEQUATE
Special Requests: ADEQUATE
Special Requests: ADEQUATE

## 2017-04-02 LAB — HEPATITIS C ANTIBODY: HCV Ab: <0.1 s/co ratio (ref 0.0–0.9)

## 2017-04-02 SURGERY — RIGHT/LEFT HEART CATH AND CORONARY ANGIOGRAPHY
Anesthesia: LOCAL

## 2017-04-02 NOTE — Progress Notes (Signed)
[id number]       [place] [address]             [phone]                 4 Days Post-Op Procedure(s) (LRB): TRANSESOPHAGEAL ECHOCARDIOGRAM (TEE) (N/A)  LOS: 4 days   Subjective: Overall the patient feels well, denies fever or chills.  Has no signs or symptoms of congestive heart failure.  Objective: Vital signs in last 24 hours: Patient Vitals for the past 24 hrs:  BP Temp Temp src Pulse Resp SpO2 Weight  04/02/17 0542 (!) 124/59 97.7 F (36.5 C) Oral 78 18 99 % 187 lb 6.4 oz (85 kg)  04/01/17 2234 (!) 117/50 97.9 F (36.6 C) Oral 75 17 98 % -    Filed Weights   03/31/17 0540 04/01/17 0618 04/02/17 0542  Weight: 185 lb 1.6 oz (84 kg) 187 lb 14.4 oz (85.2 kg) 187 lb 6.4 oz (85 kg)    Hemodynamic parameters for last 24 hours:    Intake/Output from previous day: 12/03 0701 - 12/04 0700 In: 440 [P.O.:240; IV Piggyback:200] Out: -  Intake/Output this shift: Total I/O In: 365 [P.O.:365] Out: -   Scheduled Meds: . aspirin  81 mg Oral Daily  . atorvastatin  10 mg Oral q1800  . feeding supplement (ENSURE ENLIVE)  237 mL Oral BID BM  . ferrous sulfate  325 mg Oral Daily  . heparin  5,000 Units Subcutaneous Q8H  . losartan  50 mg Oral Daily  . sodium chloride flush  3 mL Intravenous Q12H  . sodium chloride flush  3 mL Intravenous Q12H  . vitamin C  1,000 mg Oral Daily   Continuous Infusions: . sodium chloride    . sodium chloride 250 mL (03/29/17 2121)  . sodium chloride Stopped (04/01/17 1901)  .  ceFAZolin (ANCEF) IV 2 g (04/02/17 0607)   PRN Meds:.sodium chloride, sodium chloride, acetaminophen, sodium chloride flush, sodium chloride flush  General appearance: cooperative, appears stated age and no distress Neurologic: intact Heart: diastolic murmur: mid diastolic 3/6, crescendo at lower left sternal border Lungs: clear to auscultation bilaterally Abdomen: soft, non-tender; bowel sounds normal; no masses,  no  organomegaly Extremities: extremities normal, atraumatic, no cyanosis or edema and Homans sign is negative, no sign of DVT  Lab Results: CBC:No results for input(s): WBC, HGB, HCT, PLT in the last 72 hours. BMET: No results for input(s): NA, K, CL, CO2, GLUCOSE, BUN, CREATININE, CALCIUM in the last 72 hours.  PT/INR: No results for input(s): LABPROT, INR in the last 72 hours.   Radiology No results found.   Assessment/Plan: S/P Procedure(s) (LRB): TRANSESOPHAGEAL ECHOCARDIOGRAM (TEE) (N/A) No signs or symptoms of congestive heart failure, other than findings of possible splenic infarct but without symptoms patient has no other new or evidence of peripheral embolization Place PICC line when cultures are negative Continue follow to determine timing of surgery which he obviously will need due to the severity of his AI.   [name] MD 04/02/2017 2:24 PM   Generally accepted indications for surgical treatment of endocarditis:  Valve abnormalities or regurgitation resulting in congestive heart failure Microorganisms that are not controlled by antimicrobial therapy (fungal) Endocarditis leading to valve dehiscence, perforation, rupture or fistula or large perivalvular abscess, recurrent emboli Persistent vegetation or fever/bacteremia despite optimal treatment vegetations  that are mobile and larger then>10 mm in diameter on the mitral valve vegetations that are increasing in  size despite antimicrobial therapy Mitral "kissing" vegetation  Patient ID: [name] [place], male   DOB: [birth year], 61 y.o.   MRN: [id number]

## 2017-04-02 NOTE — Progress Notes (Signed)
[facility] Infusion Coordinator will follow pt with ID team to support IV ABX at DC as ordered.  I met with the pt and wife tonight to review POC for home IV ABX and have appt with wife at 4 PM tomorrow for full teach session.  Pt and wife feel confident they can support IV ABX administration at home.   If patient discharges after hours, please call [phone].   Larry Sierras 04/02/2017, 9:41 PM

## 2017-04-02 NOTE — Progress Notes (Signed)
Progress Note  Patient Name: [name] Anderson Date of Encounter: 04/02/2017  Primary Cardiologist: Dr Stanford Breed  Subjective   Denies CP or dyspnea  Inpatient Medications    Scheduled Meds: . aspirin  81 mg Oral Daily  . atorvastatin  10 mg Oral q1800  . feeding supplement (ENSURE ENLIVE)  237 mL Oral BID BM  . ferrous sulfate  325 mg Oral Daily  . heparin  5,000 Units Subcutaneous Q8H  . losartan  50 mg Oral Daily  . sodium chloride flush  3 mL Intravenous Q12H  . sodium chloride flush  3 mL Intravenous Q12H  . vitamin C  1,000 mg Oral Daily   Continuous Infusions: . sodium chloride    . sodium chloride 250 mL (03/29/17 2121)  . sodium chloride Stopped (04/01/17 1901)  .  ceFAZolin (ANCEF) IV 2 g (04/02/17 6010)   PRN Meds: sodium chloride, sodium chloride, acetaminophen, sodium chloride flush, sodium chloride flush   Vital Signs    Vitals:   04/01/17 0618 04/01/17 1300 04/01/17 2234 04/02/17 0542  BP: (!) 133/59 (!) 106/53 (!) 117/50 (!) 124/59  Pulse: 78  75 78  Resp: 17  17 18   Temp: 98.1 F (36.7 C) 97.9 F (36.6 C) 97.9 F (36.6 C) 97.7 F (36.5 C)  TempSrc: Oral Oral Oral Oral  SpO2: 100% 99% 98% 99%  Weight: 187 lb 14.4 oz (85.2 kg)   187 lb 6.4 oz (85 kg)  Height:        Intake/Output Summary (Last 24 hours) at 04/02/2017 0837 Last data filed at 04/02/2017 9323 Gross per 24 hour  Intake 440 ml  Output -  Net 440 ml   Filed Weights   03/31/17 0540 04/01/17 0618 04/02/17 0542  Weight: 185 lb 1.6 oz (84 kg) 187 lb 14.4 oz (85.2 kg) 187 lb 6.4 oz (85 kg)    Telemetry    Sinus- Personally Reviewed  Physical Exam   GEN: WD/WN No acute distress.   Neck: supple Cardiac: RRR, 2/6 diastolic murmur, no systolic murmur Respiratory: Clear to auscultation bilaterally; no wheeze GI: Soft, nontender, non-distended, no masses  MS: No peripheral stigmata of SBE Neuro:  Grossly intact   Labs    Chemistry Recent Labs  Lab 03/28/17 0907 03/29/17 1250  03/30/17 0353  NA 138 135 133*  K 5.0 4.5 4.2  CL 101 105 103  CO2 26 26 25   GLUCOSE 101* 90 100*  BUN 17 13 15   CREATININE 0.90 0.91 1.00  CALCIUM 9.3 8.7* 8.5*  PROT  --  7.2  --   ALBUMIN  --  3.1*  --   AST  --  19  --   ALT  --  13*  --   ALKPHOS  --  60  --   BILITOT  --  0.7  --   GFRNONAA 92 >60 >60  GFRAA 106 >60 >60  ANIONGAP  --  4* 5     Hematology Recent Labs  Lab 03/28/17 0907 03/29/17 1250 03/30/17 0353  WBC 9.3 8.2 8.9  RBC 4.46 4.31 4.19*  HGB 11.3* 11.0* 10.6*  HCT 34.8* 34.8* 33.3*  MCV 78* 80.7 79.5  MCH 25.3* 25.5* 25.3*  MCHC 32.5 31.6 31.8  RDW 17.5* 18.4* 18.2*  PLT 268 234 224      Patient Profile     61 y.o. male with long-standing aortic insufficiency due to aortic root dilation, presenting with severe aortic insufficiency due to superimposed MSSA endocarditis with perforation  of of leaflet as well as evidence of newasymptomatic left ventricular systolic dysfunction. May also have a vegetation on the mitral subvalvular apparatus, without significant mitral valve dysfunction. Additional problems include HTN,hyperlipidemia, history of testicular cancer and mild obstructive sleep apnea.    Assessment & Plan    1 subacute bacterial endocarditis-blood cultures positive for methicillin sensitive coag neg staph.  Continue cefazolin per ID.  Will ultimately require PICC line for home antibiotics for 6 weeks. As outlined by ID, will await FU cultures; if negative, will have PICC line placed in AM.  2 severe aortic insufficiency/aortic aneurysm-patient has vegetations noted on aortic valve with perforated cusp.  There is severe aortic insufficiency.  There is possible vegetation on mitral valve cord.  Hemodynamically stable. He will ultimately require aortic valve replacement/aortic root replacement after completing antibiotics.  Dr. Servando Snare following.  He will need evaluation of his coronaries preoperatively.  Will discuss cardiac gated CT scan  versus catheterization.  Will need dental evaluation preoperatively as well.  3 hypertension-BP stable; continue present meds  4 hyperlipidemia-continue statin.  5 normocytic anemia-likely related to endocarditis.  For questions or updates, please contact [place] Please consult [email or web address] for contact info under Cardiology/STEMI.      Signed, Kirk Ruths, MD  04/02/2017, 8:37 AM

## 2017-04-03 ENCOUNTER — Other Ambulatory Visit (HOSPITAL_COMMUNITY): Payer: 59

## 2017-04-03 DIAGNOSIS — I38 Endocarditis, valve unspecified: Secondary | ICD-10-CM | POA: Diagnosis not present

## 2017-04-03 LAB — ANTIPHOSPHOLIPID SYNDROME EVAL, BLD
Anticardiolipin IgA: <9 APL U/mL (ref 0–11)
Anticardiolipin IgG: 43 GPL U/mL — ABNORMAL HIGH (ref 0–14)
Anticardiolipin IgM: 10 MPL U/mL (ref 0–12)
DRVVT: 47 s (ref 0.0–47.0)
PTT Lupus Anticoagulant: 38.7 s (ref 0.0–51.9)
Phosphatydalserine, IgA: 7 APS IgA (ref 0–20)
Phosphatydalserine, IgG: 33 GPS IgG — ABNORMAL HIGH (ref 0–11)
Phosphatydalserine, IgM: 11 MPS IgM (ref 0–25)

## 2017-04-03 MED ORDER — SODIUM CHLORIDE 0.9% FLUSH
10.0000 mL | Freq: Two times a day (BID) | INTRAVENOUS | Status: DC
Start: 2017-04-03 — End: 2017-04-03
  Administered 2017-04-03: 10 mL

## 2017-04-03 MED ORDER — HEPARIN SOD (PORK) LOCK FLUSH 100 UNIT/ML IV SOLN
250.0000 [IU] | Freq: Once | INTRAVENOUS | Status: AC
  Administered 2017-04-03: 250 [IU] via INTRAVENOUS
  Filled 2017-04-03: qty 2.5

## 2017-04-03 MED ORDER — SODIUM CHLORIDE 0.9% FLUSH: 10.0000 mL | INTRAVENOUS | Status: DC | PRN

## 2017-04-03 MED ORDER — ATORVASTATIN CALCIUM 10 MG PO TABS: 10.0000 mg | ORAL_TABLET | Freq: Every day | ORAL | 6 refills | Status: DC

## 2017-04-03 MED ORDER — CEFAZOLIN IV (FOR PTA / DISCHARGE USE ONLY): 2.0000 g | Freq: Three times a day (TID) | INTRAVENOUS | 0 refills | Status: AC

## 2017-04-03 NOTE — H&P (View-Only) (Signed)
Progress Note  Patient Name: [name] Anderson Date of Encounter: 04/03/2017  Primary Cardiologist: Dr Stanford Breed  Subjective   No dyspnea or CP  Inpatient Medications    Scheduled Meds: . aspirin  81 mg Oral Daily  . atorvastatin  10 mg Oral q1800  . feeding supplement (ENSURE ENLIVE)  237 mL Oral BID BM  . ferrous sulfate  325 mg Oral Daily  . heparin  5,000 Units Subcutaneous Q8H  . losartan  50 mg Oral Daily  . sodium chloride flush  3 mL Intravenous Q12H  . sodium chloride flush  3 mL Intravenous Q12H  . vitamin C  1,000 mg Oral Daily   Continuous Infusions: . sodium chloride    . sodium chloride 250 mL (03/29/17 2121)  . sodium chloride Stopped (04/01/17 1901)  .  ceFAZolin (ANCEF) IV 2 g (04/03/17 0616)   PRN Meds: sodium chloride, sodium chloride, acetaminophen, sodium chloride flush, sodium chloride flush   Vital Signs    Vitals:   04/02/17 0542 04/02/17 1500 04/02/17 2025 04/03/17 0614  BP: (!) 124/59 (!) 122/53 116/60 (!) 116/55  Pulse: 78 81 78   Resp: 18 18 18    Temp: 97.7 F (36.5 C) 98.2 F (36.8 C) 98.1 F (36.7 C) 98.2 F (36.8 C)  TempSrc: Oral Oral Oral Oral  SpO2: 99% 99% 99% 96%  Weight: 187 lb 6.4 oz (85 kg)   188 lb 1.6 oz (85.3 kg)  Height:        Intake/Output Summary (Last 24 hours) at 04/03/2017 0820 Last data filed at 04/03/2017 0616 Gross per 24 hour  Intake 1630 ml  Output -  Net 1630 ml   Filed Weights   04/01/17 0618 04/02/17 0542 04/03/17 0614  Weight: 187 lb 14.4 oz (85.2 kg) 187 lb 6.4 oz (85 kg) 188 lb 1.6 oz (85.3 kg)    Telemetry    Sinus with isolated couplet- Personally Reviewed  Physical Exam   GEN: WD/WN NAD Neck: supple, no JVD Cardiac: RRR, 2/6 DM Respiratory: CTA, no rhonchi GI: NT/ND, no masses MS: No edema Neuro:  No focal findings   Labs    Chemistry Recent Labs  Lab 03/28/17 0907 03/29/17 1250 03/30/17 0353  NA 138 135 133*  K 5.0 4.5 4.2  CL 101 105 103  CO2 26 26 25   GLUCOSE 101* 90  100*  BUN 17 13 15   CREATININE 0.90 0.91 1.00  CALCIUM 9.3 8.7* 8.5*  PROT  --  7.2  --   ALBUMIN  --  3.1*  --   AST  --  19  --   ALT  --  13*  --   ALKPHOS  --  60  --   BILITOT  --  0.7  --   GFRNONAA 92 >60 >60  GFRAA 106 >60 >60  ANIONGAP  --  4* 5     Hematology Recent Labs  Lab 03/28/17 0907 03/29/17 1250 03/30/17 0353  WBC 9.3 8.2 8.9  RBC 4.46 4.31 4.19*  HGB 11.3* 11.0* 10.6*  HCT 34.8* 34.8* 33.3*  MCV 78* 80.7 79.5  MCH 25.3* 25.5* 25.3*  MCHC 32.5 31.6 31.8  RDW 17.5* 18.4* 18.2*  PLT 268 234 224      Patient Profile     61 y.o. male with long-standing aortic insufficiency due to aortic root dilation, presenting with severe aortic insufficiency due to superimposed MSSA endocarditis with perforation of of leaflet as well as evidence of newasymptomatic left ventricular systolic  dysfunction. May also have a vegetation on the mitral subvalvular apparatus, without significant mitral valve dysfunction. Additional problems include HTN,hyperlipidemia, history of testicular cancer and mild obstructive sleep apnea.    Assessment & Plan    1 subacute bacterial endocarditis-blood cultures positive for methicillin sensitive coag neg staph.  Continue cefazolin per ID.  FU cultures negative thus far. Will arrange PICC line for home antibiotics.   2 severe aortic insufficiency/aortic aneurysm-patient has vegetations noted on aortic valve with perforated cusp.  There is severe aortic insufficiency.  There is possible vegetation on mitral valve cord.  Pt remains hemodynamically stable. He will ultimately require aortic valve replacement/aortic root replacement after completing antibiotics.  Dr. Servando Snare following.  Will arrange cardiac cath in 2 weeks to evaluate coronaries preoperatively (risks and benefits including MI, death and CVA discussed and he agrees to proceed. Pt has dental appt next week to evaluate teeth prior to AVR.  3 hypertension-controlled.   4  hyperlipidemia-continue statin.  5 normocytic anemia-likely related to endocarditis.  Arrange PICC line today; then DC with home antibiotics; arrange outpt cath. FU appt with Dr Servando Snare already arranged. FU with me in 4 weeks.  > 30 min PA and physician time D2  For questions or updates, please contact [place] Please consult [email or web address] for contact info under Cardiology/STEMI.      Signed, Kirk Ruths, MD  04/03/2017, 8:20 AM

## 2017-04-03 NOTE — Progress Notes (Signed)
Progress Note  Patient Name: [name] Anderson Date of Encounter: 04/03/2017  Primary Cardiologist: Dr Stanford Breed  Subjective   No dyspnea or CP  Inpatient Medications    Scheduled Meds: . aspirin  81 mg Oral Daily  . atorvastatin  10 mg Oral q1800  . feeding supplement (ENSURE ENLIVE)  237 mL Oral BID BM  . ferrous sulfate  325 mg Oral Daily  . heparin  5,000 Units Subcutaneous Q8H  . losartan  50 mg Oral Daily  . sodium chloride flush  3 mL Intravenous Q12H  . sodium chloride flush  3 mL Intravenous Q12H  . vitamin C  1,000 mg Oral Daily   Continuous Infusions: . sodium chloride    . sodium chloride 250 mL (03/29/17 2121)  . sodium chloride Stopped (04/01/17 1901)  .  ceFAZolin (ANCEF) IV 2 g (04/03/17 0616)   PRN Meds: sodium chloride, sodium chloride, acetaminophen, sodium chloride flush, sodium chloride flush   Vital Signs    Vitals:   04/02/17 0542 04/02/17 1500 04/02/17 2025 04/03/17 0614  BP: (!) 124/59 (!) 122/53 116/60 (!) 116/55  Pulse: 78 81 78   Resp: 18 18 18    Temp: 97.7 F (36.5 C) 98.2 F (36.8 C) 98.1 F (36.7 C) 98.2 F (36.8 C)  TempSrc: Oral Oral Oral Oral  SpO2: 99% 99% 99% 96%  Weight: 187 lb 6.4 oz (85 kg)   188 lb 1.6 oz (85.3 kg)  Height:        Intake/Output Summary (Last 24 hours) at 04/03/2017 0820 Last data filed at 04/03/2017 0616 Gross per 24 hour  Intake 1630 ml  Output -  Net 1630 ml   Filed Weights   04/01/17 0618 04/02/17 0542 04/03/17 0614  Weight: 187 lb 14.4 oz (85.2 kg) 187 lb 6.4 oz (85 kg) 188 lb 1.6 oz (85.3 kg)    Telemetry    Sinus with isolated couplet- Personally Reviewed  Physical Exam   GEN: WD/WN NAD Neck: supple, no JVD Cardiac: RRR, 2/6 DM Respiratory: CTA, no rhonchi GI: NT/ND, no masses MS: No edema Neuro:  No focal findings   Labs    Chemistry Recent Labs  Lab 03/28/17 0907 03/29/17 1250 03/30/17 0353  NA 138 135 133*  K 5.0 4.5 4.2  CL 101 105 103  CO2 26 26 25   GLUCOSE 101* 90  100*  BUN 17 13 15   CREATININE 0.90 0.91 1.00  CALCIUM 9.3 8.7* 8.5*  PROT  --  7.2  --   ALBUMIN  --  3.1*  --   AST  --  19  --   ALT  --  13*  --   ALKPHOS  --  60  --   BILITOT  --  0.7  --   GFRNONAA 92 >60 >60  GFRAA 106 >60 >60  ANIONGAP  --  4* 5     Hematology Recent Labs  Lab 03/28/17 0907 03/29/17 1250 03/30/17 0353  WBC 9.3 8.2 8.9  RBC 4.46 4.31 4.19*  HGB 11.3* 11.0* 10.6*  HCT 34.8* 34.8* 33.3*  MCV 78* 80.7 79.5  MCH 25.3* 25.5* 25.3*  MCHC 32.5 31.6 31.8  RDW 17.5* 18.4* 18.2*  PLT 268 234 224      Patient Profile     61 y.o. male with long-standing aortic insufficiency due to aortic root dilation, presenting with severe aortic insufficiency due to superimposed MSSA endocarditis with perforation of of leaflet as well as evidence of newasymptomatic left ventricular systolic  dysfunction. May also have a vegetation on the mitral subvalvular apparatus, without significant mitral valve dysfunction. Additional problems include HTN,hyperlipidemia, history of testicular cancer and mild obstructive sleep apnea.    Assessment & Plan    1 subacute bacterial endocarditis-blood cultures positive for methicillin sensitive coag neg staph.  Continue cefazolin per ID.  FU cultures negative thus far. Will arrange PICC line for home antibiotics.   2 severe aortic insufficiency/aortic aneurysm-patient has vegetations noted on aortic valve with perforated cusp.  There is severe aortic insufficiency.  There is possible vegetation on mitral valve cord.  Pt remains hemodynamically stable. He will ultimately require aortic valve replacement/aortic root replacement after completing antibiotics.  Dr. Servando Snare following.  Will arrange cardiac cath in 2 weeks to evaluate coronaries preoperatively (risks and benefits including MI, death and CVA discussed and he agrees to proceed. Pt has dental appt next week to evaluate teeth prior to AVR.  3 hypertension-controlled.   4  hyperlipidemia-continue statin.  5 normocytic anemia-likely related to endocarditis.  Arrange PICC line today; then DC with home antibiotics; arrange outpt cath. FU appt with Dr Servando Snare already arranged. FU with me in 4 weeks.  > 30 min PA and physician time D2  For questions or updates, please contact [place] Please consult [email or web address] for contact info under Cardiology/STEMI.      Signed, Kirk Ruths, MD  04/03/2017, 8:20 AM

## 2017-04-03 NOTE — Progress Notes (Signed)
PHARMACY CONSULT NOTE FOR:  OUTPATIENT  PARENTERAL ANTIBIOTIC THERAPY (OPAT)  Indication: CONS endocarditis   Regimen: Ancef 2 gram IV every 8 hours  End date: 05/15/17  IV antibiotic discharge orders are pended. To discharging provider:  please sign these orders via discharge navigator,  Select New Orders & click on the button choice - Manage This Unsigned Work.     Thank you for allowing pharmacy to be a part of this patient's care.  Vincenza Hews, PharmD, BCPS 04/03/2017, 12:04 PM

## 2017-04-03 NOTE — Progress Notes (Signed)
Peripherally Inserted Central Catheter/Midline Placement  The IV Nurse has discussed with the patient and/or persons authorized to consent for the patient, the purpose of this procedure and the potential benefits and risks involved with this procedure.  The benefits include less needle sticks, lab draws from the catheter, and the patient may be discharged home with the catheter. Risks include, but not limited to, infection, bleeding, blood clot (thrombus formation), and puncture of an artery; nerve damage and irregular heartbeat and possibility to perform a PICC exchange if needed/ordered by physician.  Alternatives to this procedure were also discussed.  Bard Power PICC patient education guide, fact sheet on infection prevention and patient information card has been provided to patient /or left at bedside.    PICC/Midline Placement Documentation  PICC Single Lumen 04/03/17 PICC Right Brachial 41 cm 0 cm (Active)  Indication for Insertion or Continuance of Line Home intravenous therapies (PICC only) 04/03/2017 11:00 AM  Exposed Catheter (cm) 0 cm 04/03/2017 11:00 AM  Site Assessment Clean;Dry;Intact 04/03/2017 11:00 AM  Line Status Flushed;Blood return noted 04/03/2017 11:00 AM  Dressing Type Transparent 04/03/2017 11:00 AM  Dressing Status Clean;Dry;Intact 04/03/2017 11:00 AM  Dressing Intervention New dressing 04/03/2017 11:00 AM  Dressing Change Due 04/10/17 04/03/2017 11:00 AM       [name] Anderson 04/03/2017, 11:36 AM

## 2017-04-03 NOTE — Progress Notes (Signed)
Patient received discharge information and acknowledged understanding of it. Patient received prescription. RN answered all questions.

## 2017-04-03 NOTE — Discharge Summary (Signed)
Discharge Summary    Patient ID: [name] Anderson,  MRN: [id number], DOB/AGE: 1955/09/06 61 y.o.  Admit date: 03/29/2017 Discharge date: 04/03/2017   Primary Care Provider: Antony Contras Primary Cardiologist: Dr. 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 May also have a vegetation on the mitral subvalvular apparatus, without significant mitral valve dysfunction.Additional problems include HTN,hyperlipidemia, history of testicular cancer and mild obstructive sleep apnea.   Pt presented for  FU aortic insufficiency.Echocardiogram repeated December 2017 and showed normal LV function, grade 1 diastolic dysfunction, mild to moderate aortic insufficiency, dilated aorta, mild mitral regurgitation. CTA November 2017 showed stable aortic root dilatation at 4.7 cm. Had syncope 8/18. Monitor in September 2018 showed no significant arrhythmia. Carotid Dopplers October 2018 showed no significant stenosis. Brain MRA November 2018 normal. Echocardiogram repeated November 2018. Ejection fraction 50-55%, severe left ventricular enlargement, grade 1 diastolic dysfunction, severe aortic insufficiency, dilated aortic root at 46 mm, mild mitral regurgitation and mild left atrial enlargement. CTA of thoracic aorta repeated November 2018. The aortic root measured 4.7 cm. There were wedge-shaped lesions in the spleen question splenic infarcts. Since last seen,patient denies dyspnea, chest pain  or palpitations. He occasionally has some dizziness but has had no recurrent syncope. He has had transient double vision but previous MRI negative.  He is not having symptoms but based on decreasing LV function would favor aortic valve replacement. He will need cardiac catheterization prior to procedure. The risks and benefits including myocardial infarction, CVA and death discussed and he agrees to proceed. Will arrange evaluation with cardiothoracic surgery.   Given his thoracic aortic aneurysm, will need aortic root replacement at time of AVR.   He was scheduled for TEE.  Hospital Course     Consultants: TCTS  TEE on 03/29/17 showed low normal LV systolic function, oscillating density on left coronary cusp concerning for endocarditis, severe AI, redundant MV chord, cannot rule out associated vegetation.   The patient denies feeling ill, chills, fever, chest pain or shortness of breath. He has had some mild occ dizziness, last in early November. He had an episode of double vision for about 5 minutes last month with no reoccurrence. He had an MRA of the head on 03/12/17 that was normal. He also had carotid dopplers on 02/18/2017 that showed No evidence of significant atherosclerotic plaque or carotid stenosis in the neck bilaterally. Some velocity elevation is present which is matched by velocity elevation in the common carotid artery. Some of this velocity elevation is attributable to tortuosity.  Pt was admitted and treated with IV ABX by ID for endocarditis. Blood cultures growing gram-positive cocci in clusters in all samples, methicillin sensitive staph aureus. Blood cultures on 04/01/17 with no growth to date. PICC was placed for home IV ABX.  Given his aortic insufficiency and thoracic aortic aneurysm, TCTS evaluated for AVR. Given his positive blood cultures, they will plan for 6 weeks of IV ABX before surgical intervention.  He was set up for outpatient heart cath to evaluate  coronaries prior to surgery.  Patient seen and examined by Dr. Stanford Breed today and was stable for discharge. All follow up has been arranged.  _____________  Discharge Vitals Blood pressure (!) 116/55, pulse 78, temperature 98.2 F (36.8 C), temperature source Oral, resp. rate 18, height 5' 11" (1.803 m), weight 188 lb 1.6 oz (85.3 kg), SpO2 96 %.  Filed Weights   04/01/17 0618 04/02/17 0542 04/03/17 0614  Weight: 187 lb 14.4 oz (85.2 kg) 187 lb 6.4 oz (85 kg) 188 lb 1.6 oz (85.3 kg)    Labs & Radiologic Studies    CBC No results for input(s): WBC, NEUTROABS, HGB, HCT, MCV, PLT in the last 72 hours. Basic Metabolic Panel No results for input(s): NA, K, CL, CO2, GLUCOSE, BUN, CREATININE, CALCIUM, MG, PHOS in the last 72 hours. Liver Function Tests No results for input(s): AST, ALT, ALKPHOS, BILITOT, PROT, ALBUMIN in the last 72 hours. No results for input(s): LIPASE, AMYLASE in the last 72 hours. Cardiac Enzymes No results for input(s): CKTOTAL, CKMB, CKMBINDEX, TROPONINI in the last 72 hours. BNP Invalid input(s): POCBNP D-Dimer No results for input(s): DDIMER in the last 72 hours. Hemoglobin A1C No results for input(s): HGBA1C in the last 72 hours. Fasting Lipid Panel No results for input(s): CHOL, HDL, LDLCALC, TRIG, CHOLHDL, LDLDIRECT in the last 72 hours. Thyroid Function Tests No results for input(s): TSH, T4TOTAL, T3FREE, THYROIDAB in the last 72 hours.  Invalid input(s): FREET3 _____________  X-ray Chest Pa And Lateral  Result Date: 03/29/2017 CLINICAL DATA:  Endocarditis EXAM: CHEST  2 VIEW COMPARISON:  CTA chest dated 03/19/2017 FINDINGS: Lungs are clear.  No pleural effusion or pneumothorax. The heart is normal in size. Visualized osseous structures are within normal limits. IMPRESSION: No evidence of acute cardiopulmonary disease. Electronically Signed   By: Julian Hy M.D.   On: 03/29/2017 15:07   Mr Jodene Nam Head Wo Contrast  Result Date:  03/12/2017 CLINICAL DATA:  Dizziness for 3 months.  Hypertension. EXAM: MRA HEAD WITHOUT CONTRAST TECHNIQUE: Angiographic images of the Circle of Willis were obtained using MRA technique without intravenous contrast. COMPARISON:  None. FINDINGS: Anterior circulation: Slightly smaller right ICA in the setting of relative right A1 hypoplasia. Robust anterior communicating artery. Vessels are smooth and widely patent. Negative for aneurysm. Posterior circulation: Left dominant vertebral artery. There is symmetric loss of signal in the proximal V4 segments and picas due to partial horizontal course. There is antegrade flow in both vertebral arteries on recent carotid Doppler. The vessels are smooth and widely patent. A small left posterior communicating artery is present. Negative for aneurysm. IMPRESSION: Normal intracranial MRA. Electronically Signed   By: Monte Fantasia M.D.   On: 03/12/2017 09:00   Ct Angio Chest Aorta W &/or Wo Contrast  Result Date: 03/19/2017 CLINICAL DATA:  61 year old male with a history of thoracic aortic aneurysm EXAM: CT ANGIOGRAPHY CHEST WITH CONTRAST TECHNIQUE: Multidetector CT imaging of the chest was performed using the standard protocol during bolus administration of intravenous contrast. Multiplanar CT image reconstructions and MIPs were obtained to evaluate the vascular anatomy. CONTRAST:  185m ISOVUE-370 IOPAMIDOL (ISOVUE-370) INJECTION 76% COMPARISON:  Prior CTA of the chest 03/29/2016 FINDINGS: Cardiovascular: Stable mild aneurysmal dilatation of the aortic root. Precise measurements are challenging giving artifact related to cardiac motion, however the maximal diameter does not exceed 4.7 cm as previously measured. In fact, I suspect the true measurement to be closer to 4.4 cm. The tubular portion of the ascending thoracic aorta remains stable at  3.8 cm. The transverse and descending thoracic aorta are normal in caliber. No significant aortic plaque. Mild left ventricular  dilatation. No thrombus visualized within the cardiac chambers or left atrial appendage. Mediastinum/Nodes: Unremarkable CT appearance of the thyroid gland. No suspicious mediastinal or hilar adenopathy. No soft tissue mediastinal mass. The thoracic esophagus is unremarkable. Lungs/Pleura: Lungs are clear. No pleural effusion or pneumothorax. Upper Abdomen: Interval development of multiple wedge-shaped regions of low attenuation in the superior aspect of the spleen as well as an approximately 6.6 x 5.2 cm low-attenuation region in the inferior aspect of the spleen. These findings are new compared to prior imaging. Musculoskeletal: No chest wall abnormality. No acute or significant osseous findings. Review of the MIP images confirms the above findings. IMPRESSION: 1. Stable mild aneurysmal dilatation of the aortic root which is no larger than 4.7 cm. I suspect the 4.7 cm measurement may be slightly exaggerated by cardiac motion artifact which is been present across multiple prior studies. The true measurement of the aortic root is likely closer to 4.4 cm. Consider cardiac gated CTA of the next annual follow-up evaluation. 2. Interval development of multiple wedge-shaped regions of low attenuation in the superior aspect of the spleen as well as a new 6.6 cm low-attenuation region in the inferior aspect of the spleen. 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 There is no significant or irregular atherosclerotic plaque in the thoracic, or upper abdominal aorta to serve as a donor site for emboli. Does the patient have a clinical history of atrial fibrillation? Has the patient experienced episodes of left upper quadrant pain over the past several weeks or months? 3. Left ventricular dilatation. Aortic aneurysm NOS (ICD10-I71.9). Signed, Criselda Peaches, MD [facility] Radiology Electronically Signed   By: Jacqulynn Cadet M.D.   On:  03/19/2017 10:07     Diagnostic Studies/Procedures    TEE 03/29/17: Study Conclusions - Left ventricle: Systolic function was normal. The estimated   ejection fraction was in the range of 55% to 60%. Wall motion was   normal; there were no regional wall motion abnormalities. - Aortic valve: There was a small vegetation on the left   ventricular aspect of the left coronary cusp. There was severe   regurgitation. - Mitral valve: No evidence of vegetation. There was mild   regurgitation. - Right atrium: No evidence of thrombus in the atrial cavity or   appendage. - Tricuspid valve: No evidence of vegetation. - Pulmonic valve: No evidence of vegetation.  Impressions: - Normal LV function; oscillating density on left coronary cusp   with possible perforation and severe eccentric AI (findings   concerning for SBE); mildly dilated aortic root (4.2 cm); mildly   thickend MV with redundant MV chord (cannot R/O associated   vegetation); mild MR and TR.   Echo 03/19/17: Study Conclusions - Left ventricle: The cavity size was severely dilated. Wall   thickness was normal. Systolic function was normal. The estimated   ejection fraction was in the range of 50% to 55%. Wall motion was   normal; there were no regional wall motion abnormalities. Doppler   parameters are consistent with abnormal left ventricular   relaxation (grade 1 diastolic dysfunction). There was no evidence   of elevated ventricular filling pressure by Doppler parameters. - Aortic valve: There was severe regurgitation. - Aortic root: The aortic root was moderately dilated measuring 46   mm. - Ascending aorta: The ascending aorta was upper normal size  measuring 40 mm. - Mitral valve: There was mild regurgitation. - Left atrium: The atrium was mildly dilated. - Right ventricle: The cavity size was normal. Wall thickness was   normal. Systolic function was normal. - Right atrium: The atrium was normal in size. -  Inferior vena cava: The vessel was normal in size. - Pericardium, extracardiac: There was no pericardial effusion.  Impressions: - When compared to the prior study from 04/13/2016 there are   significant changes.   LV size has increased from normal to moderately to severely   dilated.   LVEF has decreased from 60-65% to 50-55%.   Aortic root remains moderately dilated with now severe aortic   regurgitation.   There is partially flail or redundant chordae of the anterior   mitral valve leaflet with no significant mitral regurgitation.     CT surgery consult should be considered.     Disposition   Pt is being discharged home today in good condition.  Follow-up Plans & Appointments    Follow-up Information    [facility] Follow up on 04/18/2017.   Why:  Be at hospital admissions at [facility] for 0730 heart cath Contact information: [place] [id number] [phone]       Lelon Perla, MD Follow up on 04/25/2017.   Specialty:  Cardiology Why:  3:20 pm  Contact information: [address] [place] [address] [place], [facility]-Home Follow up.   Specialty:  Home Health Services Why:  Home Health RN and IV antibiotics-agency will call arrange initial visit Contact information: [address] [place] [address] [phone]          Discharge Instructions    Diet - low sodium heart healthy   Complete by:  As directed    Home infusion instructions [facility] May follow [facility] Dosing Protocol; May administer Cathflo as needed to maintain patency of vascular access device.; Flushing of vascular access device: per [facility] Protocol: 0.9% NaCl pre/post medica...   Complete by:  As directed    Instructions:  May follow [place] Dosing Protocol   Instructions:  May administer Cathflo as needed to maintain patency of vascular access device.    Instructions:  Flushing of vascular access device: per [facility] Protocol: 0.9% NaCl pre/post medication administration and prn patency; Heparin 100 u/ml, 48m for implanted ports and Heparin 10u/ml, 539mfor all other central venous catheters.   Instructions:  May follow [facility] Anaphylaxis Protocol for First Dose Administration in the home: 0.9% NaCl at 25-50 ml/hr to maintain IV access for protocol meds. Epinephrine 0.3 ml IV/IM PRN and Benadryl 25-50 IV/IM PRN s/s of anaphylaxis.   Instructions:  [facility] Coordinator (RN) to assist per patient IV care needs in the home PRN.   Increase activity slowly   Complete by:  As directed       Discharge Medications   Allergies as of 04/03/2017   No Known Allergies     Medication List    TAKE these medications   amoxicillin 500 MG capsule Commonly known as:  AMOXIL TAKE 4 TABLETS BY MOUTH 1 HOUR PRIOR TO DENTAL APPOINTMENT   aspirin 81 MG chewable tablet Chew 81 mg by mouth daily.   atorvastatin 10 MG tablet Commonly known as:  LIPITOR Take 1 tablet (10 mg total) by mouth daily at 6 PM.   ceFAZolin IVPB Commonly known as:  ANCEF Inject 2 g into the vein every 8 (eight) hours. Indication:  CONS endocarditis Last Day of Therapy:  05/15/17 Labs - Once weekly:  CBC/D and BMP, Labs - Every other week:  ESR and CRP   COSAMIN DS PO Take 1 tablet by mouth 2 (two) times daily.   [employer] TRIPLE MAGNESIUM COMPLEX PO Take 1 tablet by mouth 2 (two) times daily.   ECHINACEA PO Take 760 mg by mouth daily.   HYPERCARE 20 % external solution Generic drug:  aluminum chloride Apply 1 application topically 3 (three) times daily as needed (for sweating.).   Iron 325 (65 Fe) MG Tabs Take 325 mg by mouth daily.   losartan 50 MG tablet Commonly known as:  COZAAR Take 1 tablet (50 mg total) by mouth daily.   NON FORMULARY Take 2 capsules by mouth 2 (two) times daily. DoTerra (Microplex VMz) 2 Capsule twice daily   NON FORMULARY Take 2  capsules by mouth 2 (two) times daily. XE0 Mega "Doterra"   OPCON-A 0.027-0.315 % Soln Generic drug:  Naphazoline-Pheniramine Place 1-2 drops into both eyes 3 (three) times daily as needed (for allergy eyes.).   sildenafil 20 MG tablet Commonly known as:  REVATIO Take 20 mg by mouth daily as needed (for ED).   SUPER B COMPLEX/C PO Take 1 tablet by mouth daily.   triamcinolone cream 0.1 % Commonly known as:  KENALOG Apply 1 application topically daily as needed (FOR DRY SKIN/ITCHY SKIN.). Apply to area after bath (DO NOT APPLY TO FACE)   TYLENOL 500 MG tablet Generic drug:  acetaminophen Take 1,000 mg by mouth every 6 (six) hours as needed (for pain.).   vitamin C 1000 MG tablet Take 1,000 mg by mouth daily.            Home Infusion Instuctions  (From admission, onward)        Start     Ordered   04/03/17 0000  Home infusion instructions [facility] May follow [place] Dosing Protocol; May administer Cathflo as needed to maintain patency of vascular access device.; Flushing of vascular access device: per [facility] Protocol: 0.9% NaCl pre/post medica...    Question Answer Comment  Instructions May follow [facility] Dosing Protocol   Instructions May administer Cathflo as needed to maintain patency of vascular access device.   Instructions Flushing of vascular access device: per [facility] Protocol: 0.9% NaCl pre/post medication administration and prn patency; Heparin 100 u/ml, 64m for implanted ports and Heparin 10u/ml, 511mfor all other central venous catheters.   Instructions May follow [facility] Anaphylaxis Protocol for First Dose Administration in the home: 0.9% NaCl at 25-50 ml/hr to maintain IV access for protocol meds. Epinephrine 0.3 ml IV/IM PRN and Benadryl 25-50 IV/IM PRN s/s of anaphylaxis.   Instructions [facility] Infusion Coordinator (RN) to assist per patient IV care needs in the home PRN.      04/03/17 1208         Outstanding Labs/Studies   [facility]  04/18/17 FU appt BC 04/25/17 TCTS appt in January   Duration of Discharge Encounter   Greater than 30 minutes including physician time.  Signed, AnTami Linuke PA-C 04/03/2017, 1:00 PM

## 2017-04-03 NOTE — Care Management Note (Addendum)
Case Management Note  Patient Details  Name: [name] Anderson MRN: [id number] Date of Birth: Nov 24, 1955  Subjective/Objective:    Subacute bacterial endocarditis, severe aortic insufficiency with vegatation,  HTN  Action/Plan: Discharge Planning: NCM spoke to pt and wife. [facility] scheduled to come and discuss IV abx at home. No DME requested. Wife will be at home to assist with care.   PCP Moreen Fowler, DAVID MD  Expected Discharge Date:  04/03/2017              Expected Discharge Plan:  [place]  In-House Referral:  NA  Discharge planning Services  CM Consult  Post Acute Care Choice:  Home Health Choice offered to:  Patient  DME Arranged:  N/A DME Agency:  NA  HH Arranged:  RN, IV Antibiotics HH Agency:  [facility]  Status of Service:  Completed, signed off  If discussed at [place] of Stay Meetings, dates discussed:    Additional Comments:  Erenest Rasher, RN 04/03/2017, 12:08 PM

## 2017-04-04 ENCOUNTER — Encounter: Payer: Self-pay | Admitting: Cardiology

## 2017-04-04 DIAGNOSIS — Z5181 Encounter for therapeutic drug level monitoring: Secondary | ICD-10-CM | POA: Diagnosis not present

## 2017-04-04 DIAGNOSIS — I38 Endocarditis, valve unspecified: Secondary | ICD-10-CM | POA: Diagnosis not present

## 2017-04-06 LAB — CULTURE, BLOOD (ROUTINE X 2)
Culture: NO GROWTH
Culture: NO GROWTH
Special Requests: ADEQUATE
Special Requests: ADEQUATE

## 2017-04-10 ENCOUNTER — Encounter: Payer: Self-pay | Admitting: Cardiology

## 2017-04-11 DIAGNOSIS — Z7689 Persons encountering health services in other specified circumstances: Secondary | ICD-10-CM | POA: Diagnosis not present

## 2017-04-11 DIAGNOSIS — Z5181 Encounter for therapeutic drug level monitoring: Secondary | ICD-10-CM | POA: Diagnosis not present

## 2017-04-11 DIAGNOSIS — I38 Endocarditis, valve unspecified: Secondary | ICD-10-CM | POA: Diagnosis not present

## 2017-04-11 NOTE — Telephone Encounter (Signed)
Pt's wife aware that pt could have dental work done prior to AVR per Dr Stanford Breed .Adonis Housekeeper

## 2017-04-12 ENCOUNTER — Encounter: Payer: 59 | Admitting: Thoracic Surgery (Cardiothoracic Vascular Surgery)

## 2017-04-12 NOTE — Progress Notes (Signed)
HPI: FU SBE, aortic insufficiency and TAA.Had syncope 8/18. Monitor in September 2018 showed no significant arrhythmia. Carotid Dopplers October 2018 showed no significant stenosis. Brain MRA November 2018 normal. Echocardiogram repeated November 2018. Ejection fraction 50-55%, severe left ventricular enlargement, grade 1 diastolic dysfunction, severe aortic insufficiency, dilated aortic root at 46 mm, mild mitral regurgitation and mild left atrial enlargement. CTA of thoracic aorta repeated November 2018. The aortic root measured 4.7 cm. There were wedge-shaped lesions in the spleen question splenic infarcts. Transesophageal echocardiogram November 2018 showed normal LV function, aortic valve vegetation on left coronary cusp, severe aortic insufficiency, mildly dilated aortic root, redundant mitral valve chordae and associated vegetation could not be excluded, mild mitral regurgitation. Blood cultures grew coag negative staph. Patient placed on antibiotics with plans for aortic valve/aortic root replacement after completing 6 weeks. Cardiac catheterization 12/20 showed normal coronary arteries and normal right and left heart pressures. Since last seen,the patient denies any dyspnea on exertion, orthopnea, PND, pedal edema, palpitations, syncope or chest pain.   Current Outpatient Medications  Medication Sig Dispense Refill  . acetaminophen (TYLENOL) 500 MG tablet Take 1,000 mg by mouth every 6 (six) hours as needed (for pain.).     Marland Kitchen aluminum chloride (HYPERCARE) 20 % external solution Apply 1 application topically 3 (three) times daily as needed (for sweating.).    Marland Kitchen amoxicillin (AMOXIL) 500 MG capsule TAKE 4 TABLETS BY MOUTH 1 HOUR PRIOR TO DENTAL APPOINTMENT 4 capsule 1  . Ascorbic Acid (VITAMIN C) 1000 MG tablet Take 1,000 mg by mouth daily.    Marland Kitchen aspirin 81 MG chewable tablet Chew 81 mg by mouth daily.    Marland Kitchen atorvastatin (LIPITOR) 10 MG tablet Take 1 tablet (10 mg total) by mouth daily at 6  PM. 30 tablet 6  . ceFAZolin (ANCEF) IVPB Inject 2 g into the vein every 8 (eight) hours. Indication:  CONS endocarditis Last Day of Therapy:  05/15/17 Labs - Once weekly:  CBC/D and BMP, Labs - Every other week:  ESR and CRP 126 Units 0  . CVS TRIPLE MAGNESIUM COMPLEX PO Take 1 tablet by mouth 2 (two) times daily.    Marland Kitchen ECHINACEA PO Take 760 mg by mouth daily.    . Ferrous Sulfate (IRON) 325 (65 Fe) MG TABS Take 325 mg by mouth daily.     . Glucosamine-Chondroitin (COSAMIN DS PO) Take 1 tablet by mouth 2 (two) times daily.    Marland Kitchen losartan (COZAAR) 50 MG tablet Take 1 tablet (50 mg total) by mouth daily. 90 tablet 3  . Naphazoline-Pheniramine (OPCON-A) 0.027-0.315 % SOLN Place 1-2 drops into both eyes 3 (three) times daily as needed (for allergy eyes.).    Marland Kitchen NON FORMULARY Take 2 capsules by mouth 2 (two) times daily. DoTerra (Microplex VMz) 2 Capsule twice daily    . NON FORMULARY Take 2 capsules by mouth 2 (two) times daily. XE0 Mega "Doterra"    . sildenafil (REVATIO) 20 MG tablet Take 20 mg by mouth daily as needed (for ED).     . SUPER B COMPLEX/C PO Take 1 tablet by mouth daily.    Marland Kitchen triamcinolone cream (KENALOG) 0.1 % Apply 1 application topically daily as needed (FOR DRY SKIN/ITCHY SKIN.). Apply to area after bath (DO NOT APPLY TO FACE)  1   No current facility-administered medications for this visit.      Past Medical History:  Diagnosis Date  . Allergy   . Anemia   . Barrett's esophagus -  short segment 03/13/2017  . Cancer ([place]) 09/11/13   testicular  . ED (erectile dysfunction)   . Endocarditis    Archie Endo 03/29/2017  . H/O aneurysm 03/05/2017  . High cholesterol   . History of basal cell carcinoma excision    02/2002   --  NOSE  &   2013  RIGHT EAR  . History of kidney stones   . Hyperlipidemia   . Mild obstructive sleep apnea    PER STUDY 01-18-2005--  NO CPAP OR MOUTH GUARD  . Moderate aortic valve insufficiency   . Multiple pulmonary nodules    PER CT--  PROBABLE   GRANULOMATOUS  . Personal history of colonic polyps    TUBULAR ADENOMA--   . Seasonal allergies   . 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[place])   . Testicular mass    RIGHT    Past Surgical History:  Procedure Laterality Date  . COLONOSCOPY  01/24/06, 07/10/11  . EXTRACORPOREAL SHOCK WAVE LITHOTRIPSY    . HERNIA REPAIR    . MOHS SURGERY  02/2002  &  2013   TIP OF NOSE-///     RIGHT EAR  . ORCHIECTOMY Right 09/11/2013   Procedure:  RIGHT ORCHIECTOMY;  Surgeon: Jorja Loa, MD;  Location: [facility];  Service: Urology;  Laterality: Right;  . RIGHT/LEFT HEART CATH AND CORONARY ANGIOGRAPHY N/A 04/18/2017   Procedure: RIGHT/LEFT HEART CATH AND CORONARY ANGIOGRAPHY;  Surgeon: Sherren Mocha, MD;  Location: [facility];  Service: Cardiovascular;  Laterality: N/A;  . SCROTAL EXPLORATION Right 09/11/2013   Procedure: RIGHT INGUINAL EXPLORATION;  Surgeon: Jorja Loa, MD;  Location: [facility];  Service: Urology;  Laterality: Right;  . TEE WITHOUT CARDIOVERSION N/A 03/29/2017   Procedure: TRANSESOPHAGEAL ECHOCARDIOGRAM (TEE);  Surgeon: Lelon Perla, MD;  Location: [facility] ENDOSCOPY;  Service: Cardiovascular;  Laterality: N/A;  . TRANSTHORACIC ECHOCARDIOGRAM  02-25-2013   DR Sipriano Fendley   NORMAL LVSF/  EF 55-60%/   GRADE 2 DIASTOLIC DYSFUNCTION/  ECCENTRIC MODERATE AORTIC INSUFFICIENCY WITHOUT STENOSIS/  MILD DILATED AORTIC ROOT/ TRIVIAL MR, TR,  & PR  . UMBILICAL HERNIA REPAIR  06-08-2009  . URETEROSCOPIC LASER LITHOTRIPSY STONE EXTRACTION  2007    Social History   Socioeconomic History  . Marital status: Married    Spouse name: Coralyn Mark  . Number of children: 2  . Years of education: 17  . Highest education level: Not on file  Social Needs  . Financial resource strain: Not on file  . Food insecurity - worry: Not on file  . Food insecurity - inability: Not on file  . Transportation needs - medical: Not on file  . Transportation needs - non-medical: Not on  file  Occupational History  . 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 Smoking status: Current Some Day Smoker    Types: Pipe  . Smokeless tobacco: Former Systems developer    Types: [place] date: 04/26/2011  . Tobacco comment: OCCASIONAL  PIPE SMOKER  (NEVER SMOKED CIGARETTES)  Substance and Sexual Activity  . Alcohol use: Yes    Comment: 03/29/2017 "might have a glass of wine twice/year"  . Drug use: No  . Sexual activity: Yes  Other Topics Concern  . Not on file  Social History Narrative   Lives with wife Coralyn Mark   Caffeine use: coffee- 1 cup per day    Family History  Problem Relation Age of Onset  . Colon cancer Father   . Heart disease Paternal Grandmother  ROS: no fevers or chills, productive cough, hemoptysis, dysphasia, odynophagia, melena, hematochezia, dysuria, hematuria, rash, seizure activity, orthopnea, PND, pedal edema, claudication. Remaining systems are negative.  Physical Exam: Well-developed well-nourished in no acute distress.  Skin is warm and dry.  HEENT is normal.  Neck is supple.  Chest is clear to auscultation with normal expansion.  Cardiovascular exam is regular rate and rhythm. 1/6 systolic and 3/6 diastolic murmur. Abdominal exam nontender or distended. No masses palpated. Extremities show no edema. No peripheral stigmata of SBE. neuro grossly intact  A/P  1 subacute bacterial endocarditis-continue antibiotics.  2 severe aortic insufficiency-patient is scheduled to see Dr. Servando Snare for aortic valve replacement in the near future.  3 thoracic aortic aneurysm-patient will have aortic root replacement at time of AVR.  4 hypertension-blood pressure is controlled. Continue present medications.  5 hyperlipidemia-continue statin.  Kirk Ruths, MD

## 2017-04-13 DIAGNOSIS — I38 Endocarditis, valve unspecified: Secondary | ICD-10-CM | POA: Diagnosis not present

## 2017-04-15 ENCOUNTER — Telehealth: Payer: Self-pay | Admitting: Cardiology

## 2017-04-15 DIAGNOSIS — I38 Endocarditis, valve unspecified: Secondary | ICD-10-CM | POA: Diagnosis not present

## 2017-04-15 DIAGNOSIS — Z5181 Encounter for therapeutic drug level monitoring: Secondary | ICD-10-CM | POA: Diagnosis not present

## 2017-04-15 NOTE — Telephone Encounter (Signed)
New Message     Patient is calling regarding the heart cath for 12/20, would like to go over details please call

## 2017-04-15 NOTE — Telephone Encounter (Signed)
Spoke with pt, instructions regarding cath discussed in detail with the patient. Directions and medication instructions discussed.

## 2017-04-17 ENCOUNTER — Telehealth: Payer: Self-pay

## 2017-04-17 ENCOUNTER — Other Ambulatory Visit: Payer: Self-pay | Admitting: Pharmacist

## 2017-04-17 ENCOUNTER — Telehealth: Payer: Self-pay | Admitting: Cardiology

## 2017-04-17 DIAGNOSIS — I351 Nonrheumatic aortic (valve) insufficiency: Secondary | ICD-10-CM | POA: Diagnosis present

## 2017-04-17 NOTE — Telephone Encounter (Signed)
Returned call to patient.He stated he is to arrive at hospital at 5:30 am.tomorrow for a cardiac cath.Stated he takes antibiotics through pic line at 8:00 am.He was told by hospital he cannot bring home medications.He wanted to if ok to hold antibiotic in am.Advised I will send message to [facility] for advice.

## 2017-04-17 NOTE — Telephone Encounter (Signed)
Patient contacted pre-catheterization at [facility] scheduled for:  04/18/2017 @ 0730 Verified arrival time and place:  NT @ 0530 Confirmed AM meds to be taken pre-cath with sip of water: Take ASA Confirmed patient has responsible person to drive home post procedure and observe patient for 24 hours: yes Addl concerns:  Pt with PICC line-gets Ancef 2 G every 8 hours for endocarditis.  Normal morning dose @ 8:00 am.  Pt to call doctor to see if he can have that dose prior to arriving at short stay.  Will call this nurse back.   This nurse called short stay to make them aware.

## 2017-04-17 NOTE — Telephone Encounter (Signed)
Spoke with Dr. Burt Knack, per Dr. Burt Knack have Pt take 8 am dose of Ancef before he arrives at short stay. Call placed to Pt, notified to take am dose of IV ancef prior to arrival for procedure.  Pt indicates understanding.

## 2017-04-17 NOTE — Telephone Encounter (Signed)
New message    Patient calling with pre procedure questions. He is unsure about taking  his antibiotic prior to procedure tomorrow. Please call

## 2017-04-18 ENCOUNTER — Encounter (HOSPITAL_COMMUNITY)
Admission: RE | Disposition: A | Discharge status: Home / Self Care | Payer: Self-pay | Source: Ambulatory Visit | Attending: Cardiovascular Disease

## 2017-04-18 ENCOUNTER — Encounter (HOSPITAL_COMMUNITY): Payer: Self-pay | Admitting: Cardiovascular Disease

## 2017-04-18 ENCOUNTER — Ambulatory Visit (HOSPITAL_COMMUNITY)
Admission: RE | Admit: 2017-04-18 | Discharge: 2017-04-18 | Disposition: A | Discharge status: Home / Self Care | Payer: 59 | Source: Ambulatory Visit | Attending: Cardiovascular Disease | Admitting: Cardiovascular Disease

## 2017-04-18 DIAGNOSIS — Z01810 Encounter for preprocedural cardiovascular examination: Secondary | ICD-10-CM | POA: Insufficient documentation

## 2017-04-18 DIAGNOSIS — I351 Nonrheumatic aortic (valve) insufficiency: Secondary | ICD-10-CM | POA: Diagnosis not present

## 2017-04-18 DIAGNOSIS — D649 Anemia, unspecified: Secondary | ICD-10-CM | POA: Insufficient documentation

## 2017-04-18 DIAGNOSIS — I330 Acute and subacute infective endocarditis: Secondary | ICD-10-CM | POA: Insufficient documentation

## 2017-04-18 DIAGNOSIS — E785 Hyperlipidemia, unspecified: Secondary | ICD-10-CM | POA: Insufficient documentation

## 2017-04-18 DIAGNOSIS — Z8547 Personal history of malignant neoplasm of testis: Secondary | ICD-10-CM | POA: Insufficient documentation

## 2017-04-18 DIAGNOSIS — B9689 Other specified bacterial agents as the cause of diseases classified elsewhere: Secondary | ICD-10-CM | POA: Diagnosis not present

## 2017-04-18 DIAGNOSIS — I10 Essential (primary) hypertension: Secondary | ICD-10-CM | POA: Diagnosis not present

## 2017-04-18 DIAGNOSIS — G4733 Obstructive sleep apnea (adult) (pediatric): Secondary | ICD-10-CM | POA: Insufficient documentation

## 2017-04-18 HISTORY — PX: RIGHT/LEFT HEART CATH AND CORONARY ANGIOGRAPHY: CATH118266

## 2017-04-18 LAB — BASIC METABOLIC PANEL
Anion gap: 8 (ref 5–15)
BUN: 18 mg/dL (ref 6–20)
CO2: 25 mmol/L (ref 22–32)
Calcium: 9.1 mg/dL (ref 8.9–10.3)
Chloride: 105 mmol/L (ref 101–111)
Creatinine, Ser: 1.03 mg/dL (ref 0.61–1.24)
GFR calc Af Amer: >60 mL/min (ref >60)
GFR calc non Af Amer: >60 mL/min (ref >60)
Glucose, Bld: 101 mg/dL — ABNORMAL HIGH (ref 65–99)
Potassium: 4.3 mmol/L (ref 3.5–5.1)
Sodium: 138 mmol/L (ref 135–145)

## 2017-04-18 LAB — CBC
HCT: 40.3 % (ref 39.0–52.0)
Hemoglobin: 12.8 g/dL — ABNORMAL LOW (ref 13.0–17.0)
MCH: 26.4 pg (ref 26.0–34.0)
MCHC: 31.8 g/dL (ref 30.0–36.0)
MCV: 83.3 fL (ref 78.0–100.0)
Platelets: 211 10*3/uL (ref 150–400)
RBC: 4.84 MIL/uL (ref 4.22–5.81)
RDW: 19.1 % — ABNORMAL HIGH (ref 11.5–15.5)
WBC: 6.9 10*3/uL (ref 4.0–10.5)

## 2017-04-18 LAB — POCT I-STAT 3, VENOUS BLOOD GAS (G3P V)
Acid-base deficit: 2 mmol/L (ref 0.0–2.0)
Acid-base deficit: 3 mmol/L — ABNORMAL HIGH (ref 0.0–2.0)
Bicarbonate: 22.6 mmol/L (ref 20.0–28.0)
Bicarbonate: 23.6 mmol/L (ref 20.0–28.0)
O2 Saturation: 72 %
O2 Saturation: 73 %
TCO2: 24 mmol/L (ref 22–32)
TCO2: 25 mmol/L (ref 22–32)
pCO2, Ven: 41.3 mmHg — ABNORMAL LOW (ref 44.0–60.0)
pCO2, Ven: 41.4 mmHg — ABNORMAL LOW (ref 44.0–60.0)
pH, Ven: 7.346 (ref 7.250–7.430)
pH, Ven: 7.364 (ref 7.250–7.430)
pO2, Ven: 39 mmHg (ref 32.0–45.0)
pO2, Ven: 41 mmHg (ref 32.0–45.0)

## 2017-04-18 LAB — POCT I-STAT 3, ART BLOOD GAS (G3+)
Acid-base deficit: 3 mmol/L — ABNORMAL HIGH (ref 0.0–2.0)
Bicarbonate: 22 mmol/L (ref 20.0–28.0)
O2 Saturation: 96 %
TCO2: 23 mmol/L (ref 22–32)
pCO2 arterial: 37.3 mmHg (ref 32.0–48.0)
pH, Arterial: 7.38 (ref 7.350–7.450)
pO2, Arterial: 82 mmHg — ABNORMAL LOW (ref 83.0–108.0)

## 2017-04-18 LAB — PROTIME-INR
INR: 1
Prothrombin Time: 13.1 seconds (ref 11.4–15.2)

## 2017-04-18 SURGERY — RIGHT/LEFT HEART CATH AND CORONARY ANGIOGRAPHY
Anesthesia: LOCAL

## 2017-04-18 MED ORDER — SODIUM CHLORIDE 0.9 % WEIGHT BASED INFUSION
3.0000 mL/kg/h | INTRAVENOUS | Status: AC
  Administered 2017-04-18: 3 mL/kg/h via INTRAVENOUS

## 2017-04-18 MED ORDER — MIDAZOLAM HCL 2 MG/2ML IJ SOLN
INTRAMUSCULAR | Status: AC
  Filled 2017-04-18: qty 2

## 2017-04-18 MED ORDER — IOPAMIDOL (ISOVUE-370) INJECTION 76%
INTRAVENOUS | Status: DC | PRN
  Administered 2017-04-18: 50 mL via INTRA_ARTERIAL

## 2017-04-18 MED ORDER — SODIUM CHLORIDE 0.9 % IV SOLN: 250.0000 mL | INTRAVENOUS | Status: DC | PRN

## 2017-04-18 MED ORDER — SODIUM CHLORIDE 0.9% FLUSH: 3.0000 mL | Freq: Two times a day (BID) | INTRAVENOUS | Status: DC

## 2017-04-18 MED ORDER — SODIUM CHLORIDE 0.9% FLUSH: 3.0000 mL | INTRAVENOUS | Status: DC | PRN

## 2017-04-18 MED ORDER — VERAPAMIL HCL 2.5 MG/ML IV SOLN
INTRAVENOUS | Status: DC | PRN
  Administered 2017-04-18: 10 mL via INTRA_ARTERIAL

## 2017-04-18 MED ORDER — HEPARIN (PORCINE) IN NACL 2-0.9 UNIT/ML-% IJ SOLN
INTRAMUSCULAR | Status: AC
  Filled 2017-04-18: qty 1000

## 2017-04-18 MED ORDER — HEPARIN (PORCINE) IN NACL 2-0.9 UNIT/ML-% IJ SOLN
INTRAMUSCULAR | Status: AC | PRN
  Administered 2017-04-18: 1000 mL

## 2017-04-18 MED ORDER — LIDOCAINE HCL (PF) 1 % IJ SOLN
INTRAMUSCULAR | Status: AC
  Filled 2017-04-18: qty 30

## 2017-04-18 MED ORDER — LIDOCAINE HCL (PF) 1 % IJ SOLN
INTRAMUSCULAR | Status: DC | PRN
  Administered 2017-04-18 (×2): 5 mL

## 2017-04-18 MED ORDER — IOPAMIDOL (ISOVUE-370) INJECTION 76%
INTRAVENOUS | Status: AC
  Filled 2017-04-18: qty 100

## 2017-04-18 MED ORDER — FENTANYL CITRATE (PF) 100 MCG/2ML IJ SOLN
INTRAMUSCULAR | Status: AC
  Filled 2017-04-18: qty 2

## 2017-04-18 MED ORDER — FENTANYL CITRATE (PF) 100 MCG/2ML IJ SOLN
INTRAMUSCULAR | Status: DC | PRN
  Administered 2017-04-18: 25 ug via INTRAVENOUS

## 2017-04-18 MED ORDER — SODIUM CHLORIDE 0.9 % WEIGHT BASED INFUSION: 1.0000 mL/kg/h | INTRAVENOUS | Status: DC

## 2017-04-18 MED ORDER — MIDAZOLAM HCL 2 MG/2ML IJ SOLN
INTRAMUSCULAR | Status: DC | PRN
  Administered 2017-04-18: 2 mg via INTRAVENOUS

## 2017-04-18 MED ORDER — HEPARIN SODIUM (PORCINE) 1000 UNIT/ML IJ SOLN
INTRAMUSCULAR | Status: AC
  Filled 2017-04-18: qty 1

## 2017-04-18 MED ORDER — HEPARIN SODIUM (PORCINE) 1000 UNIT/ML IJ SOLN
INTRAMUSCULAR | Status: DC | PRN
  Administered 2017-04-18: 4000 [IU] via INTRAVENOUS

## 2017-04-18 MED ORDER — VERAPAMIL HCL 2.5 MG/ML IV SOLN
INTRAVENOUS | Status: AC
  Filled 2017-04-18: qty 2

## 2017-04-18 MED ORDER — ASPIRIN 81 MG PO CHEW: 81.0000 mg | CHEWABLE_TABLET | ORAL | Status: DC

## 2017-04-18 SURGICAL SUPPLY — 12 items

## 2017-04-18 NOTE — Interval H&P Note (Signed)
History and Physical Interval Note:  04/18/2017 7:41 AM  [name]  has presented today for surgery, with the diagnosis of pre valve  The various methods of treatment have been discussed with the patient and family. After consideration of risks, benefits and other options for treatment, the patient has consented to  Procedure(s): RIGHT/LEFT HEART CATH AND CORONARY ANGIOGRAPHY (N/A) as a surgical intervention .  The patient's history has been reviewed, patient examined, no change in status, stable for surgery.  I have reviewed the patient's chart and labs.  Questions were answered to the patient's satisfaction.    Chart reviewed. Pt with Ao valve endocarditis, presenting electively for pre-surgical cath (R/L). Reviewed risks, indications with patient and family.  [name] Anderson

## 2017-04-18 NOTE — Discharge Instructions (Signed)

## 2017-04-18 NOTE — Progress Notes (Signed)
Pulled L brachial sheath at 0850, pressure held for 8 minutes. VSS. Level 0, gauze/transparent dsg applied, Clean,dry,intact.

## 2017-04-22 DIAGNOSIS — I38 Endocarditis, valve unspecified: Secondary | ICD-10-CM | POA: Diagnosis not present

## 2017-04-22 DIAGNOSIS — Z5181 Encounter for therapeutic drug level monitoring: Secondary | ICD-10-CM | POA: Diagnosis not present

## 2017-04-25 ENCOUNTER — Encounter: Payer: Self-pay | Admitting: Cardiology

## 2017-04-25 ENCOUNTER — Ambulatory Visit (INDEPENDENT_AMBULATORY_CARE_PROVIDER_SITE_OTHER): Payer: 59 | Admitting: Cardiology

## 2017-04-25 VITALS — BP 126/52 | HR 76 | Ht 71.0 in | Wt 207.0 lb

## 2017-04-25 DIAGNOSIS — I77810 Thoracic aortic ectasia: Secondary | ICD-10-CM

## 2017-04-25 DIAGNOSIS — I38 Endocarditis, valve unspecified: Secondary | ICD-10-CM

## 2017-04-25 DIAGNOSIS — I351 Nonrheumatic aortic (valve) insufficiency: Secondary | ICD-10-CM

## 2017-04-25 DIAGNOSIS — E7800 Pure hypercholesterolemia, unspecified: Secondary | ICD-10-CM | POA: Diagnosis not present

## 2017-04-25 NOTE — Patient Instructions (Signed)
Your physician recommends that you schedule a follow-up appointment in: 3 MONTHS WITH DR CRENSHAW  If you need a refill on your cardiac medications before your next appointment, please call your pharmacy.   

## 2017-04-27 DIAGNOSIS — I38 Endocarditis, valve unspecified: Secondary | ICD-10-CM | POA: Diagnosis not present

## 2017-04-29 ENCOUNTER — Encounter: Payer: Self-pay | Admitting: Infectious Diseases

## 2017-04-29 DIAGNOSIS — Z5181 Encounter for therapeutic drug level monitoring: Secondary | ICD-10-CM | POA: Diagnosis not present

## 2017-04-29 DIAGNOSIS — I38 Endocarditis, valve unspecified: Secondary | ICD-10-CM | POA: Diagnosis not present

## 2017-04-30 DIAGNOSIS — I38 Endocarditis, valve unspecified: Secondary | ICD-10-CM | POA: Diagnosis not present

## 2017-05-01 DIAGNOSIS — M2550 Pain in unspecified joint: Secondary | ICD-10-CM | POA: Diagnosis not present

## 2017-05-01 DIAGNOSIS — I330 Acute and subacute infective endocarditis: Secondary | ICD-10-CM | POA: Diagnosis not present

## 2017-05-02 ENCOUNTER — Encounter: Payer: Self-pay | Admitting: Cardiothoracic Surgery

## 2017-05-02 ENCOUNTER — Other Ambulatory Visit: Payer: Self-pay | Admitting: *Deleted

## 2017-05-02 ENCOUNTER — Other Ambulatory Visit: Payer: Self-pay

## 2017-05-02 ENCOUNTER — Ambulatory Visit: Payer: 59 | Admitting: Cardiothoracic Surgery

## 2017-05-02 VITALS — BP 147/66 | HR 75 | Resp 20 | Ht 71.0 in | Wt 206.0 lb

## 2017-05-02 DIAGNOSIS — I712 Thoracic aortic aneurysm, without rupture: Secondary | ICD-10-CM

## 2017-05-02 DIAGNOSIS — I351 Nonrheumatic aortic (valve) insufficiency: Secondary | ICD-10-CM | POA: Diagnosis not present

## 2017-05-02 DIAGNOSIS — I358 Other nonrheumatic aortic valve disorders: Secondary | ICD-10-CM | POA: Diagnosis not present

## 2017-05-02 DIAGNOSIS — I38 Endocarditis, valve unspecified: Secondary | ICD-10-CM

## 2017-05-02 DIAGNOSIS — I7121 Aneurysm of the ascending aorta, without rupture: Secondary | ICD-10-CM

## 2017-05-02 NOTE — Patient Instructions (Signed)
Aortic Valve Replacement °Aortic valve replacement is surgical replacement of an aortic valve that cannot be repaired. This procedure may be done to replace: °· A narrow aortic valve (aortic valve stenosis). °· A deformed aortic valve (bicuspid aortic valve). °· An aortic valve that does not close all the way (aortic insufficiency). ° °The aortic valve is replaced with artificial (prosthetic) valve. Three types of prosthetic valves are available: °· Mechanical valves made entirely from man-made materials. °· Donor valves from human donors. These are only used in special situations. °· Biological valves made from animal tissues. ° °The type of prosthetic valve used will be determined based on various factors, including your age, your lifestyle, and other medical conditions you have. This procedure is done using an open surgical technique, meaning that a large incision will be made in your chest over your heart. °Tell a health care provider about: °· Any allergies you have. °· All medicines you are taking, including vitamins, herbs, eye drops, creams, and over-the-counter medicines. °· Any problems you or family members have had with anesthetic medicines. °· Any blood disorders you have. °· Any surgeries you have had. °· Any medical conditions you have. °· Whether you are pregnant or may be pregnant. °What are the risks? °Generally, this is a safe procedure. However, problems may occur, including: °· Infection. °· Bleeding. °· Allergic reactions to medicines. °· Damage to other structures or organs. °· Blood clotting caused by the prosthetic valve. °· Failure of the prosthetic valve. ° °What happens before the procedure? °· Ask your health care provider about: °? Changing or stopping your regular medicines. This is especially important if you are taking diabetes medicines or blood thinners. °? Taking medicines such as aspirin and ibuprofen. These medicines can thin your blood. Do not take these medicines before your  procedure if your health care provider instructs you not to. °· Follow instructions from your health care provider about eating or drinking restrictions. °· You may be given antibiotic medicine to help prevent infection. °· You may have tests, such as: °? Echocardiogram. °? Electrocardiogram (ECG). °? Blood tests. °? Urine tests. °· Plan to have someone take you home when you leave the hospital. °· Plan to have someone with you for at least 24 hours after you leave the hospital. °What happens during the procedure? °· To reduce your risk of infection: °? Your health care team will wash or sanitize their hands. °? Your skin will be washed with soap. °· An IV tube will be inserted into one of your veins. °· You will be given one or more of the following: °? A medicine to help you relax (sedative). °? A medicine to make you fall asleep (general anesthetic). °· You will be placed on a heart-lung bypass machine. This machine provides oxygen to your blood while your heart is undergoing surgery. °· An incision will be made in your chest, over your heart. °· Your damaged aortic valve will be removed. °· A prosthetic valve will be sewn into your heart. °· Your incision will be closed with stitches (sutures), skin glue, or adhesive tape. °· A bandage (dressing) will be placed over your incision. °The procedure may vary among health care providers and hospitals. °What happens after the procedure? °· Your blood pressure, heart rate, breathing rate, and blood oxygen level will be monitored often until the medicines you were given have worn off. °· Do not drive until your health care provider approves. °This information is not intended to replace advice   given to you by your health care provider. Make sure you discuss any questions you have with your health care provider. Document Released: 09/05/2004 Document Revised: 09/22/2015 Document Reviewed: 03/20/2015 Elsevier Interactive Patient Education  2017 [employer].  Thoracic  Aortic Aneurysm An aneurysm is a bulge in an artery. It happens when blood pushes up against a weakened or damaged artery wall. A thoracic aortic aneurysm is an aneurysm that occurs in the first part of the aorta, between the heart and the diaphragm. The aorta is the main artery of the body. It supplies blood from the heart to the rest of the body. Some aneurysms may not cause symptoms or problems. However, the major concern with a thoracic aortic aneurysm is that it can enlarge and burst (rupture), or blood can flow between the layers of the wall of the aorta through a tear (aorticdissection). Both of these conditions can cause bleeding inside the body and can be life-threatening if they are not diagnosed and treated right away. What are the causes? The exact cause of this condition is not known. What increases the risk? The following factors may make you more likely to develop this condition:  Being age 5 or older.  Having a hardening of the arteries caused by the buildup of fat and other substances in the lining of a blood vessel (arteriosclerosis).  Having inflammation of the walls of an artery (arteritis).  Having a genetic disease that weakens the body's connective tissue, such as Marfan syndrome.  Having an injury or trauma to the aorta.  Having an infection that is caused by bacteria, such as syphilis or staphylococcus, in the wall of the aorta (infectious aortitis).  Having high blood pressure (hypertension).  Being male.  Being white (Caucasian).  Having high cholesterol.  Having a family history of aneurysms.  Using tobacco.  Having chronic obstructive pulmonary disease (COPD).  What are the signs or symptoms? Symptoms of this condition vary depending on the size and rate of growth of the aneurysm. Most grow slowly and do not cause any symptoms. When symptoms do occur, they may include:  Pain in the chest, back, sides, or abdomen. The pain may vary in intensity. A  sudden onset of severe pain may indicate that the aneurysm has ruptured.  Hoarseness.  Cough.  Shortness of breath.  Swallowing problems.  Swelling in the face, arms, or legs.  Fever.  Unexplained weight loss.  How is this diagnosed? This condition may be diagnosed with:  An ultrasound.  X-rays.  A CT scan.  An MRI.  Tests to check the arteries for damage or blockages (angiogram).  Most unruptured thoracic aortic aneurysms cause no symptoms, so they are often found during exams for other conditions. How is this treated? Treatment for this condition depends on:  The size of the aneurysm.  How fast the aneurysm is growing.  Your age.  Risk factors for rupture.  Aneurysms that are smaller than 2.2 inches (5.5 cm) may be managed by using medicines to control blood pressure, manage pain, or fight infection. You may need regular monitoring to see if the aneurysm is getting bigger. Your health care provider may recommend that you have an ultrasound every year or every 6 months. How often you need to have an ultrasound depends on the size of the aneurysm, how fast it is growing, and whether you have a family history of aneurysms. Surgical repair may be needed if your aneurysm is larger than 2.2 inches or if it is growing  quickly. Follow these instructions at home: Eating and drinking  Eat a healthy diet. Your health care provider may recommend that you: ? Lower your salt (sodium) intake. In some people, too much salt can raise blood pressure and increase the risk of thoracic aortic aneurysm. ? Avoid foods that are high in saturated fat and cholesterol, such as red meat and dairy. ? Eat a diet that is low in sugar. ? Increase your fiber intake by including whole grains, vegetables, and fruits in your diet. Eating these foods may help to lower blood pressure.  Limit or avoid alcohol as recommended by your health care provider. Lifestyle  Follow instructions from your  health care provider about healthy lifestyle habits. Your health care provider may recommend that you: ? Do not use any products that contain nicotine or tobacco, such as cigarettes and e-cigarettes. If you need help quitting, ask your health care provider. ? Keep your blood pressure within normal limits. The target limit for most people is below 120/80. Check your blood pressure regularly. If it is high, ask your health care provider about ways that you can control it. ? Keep your blood sugar (glucose) level and cholesterol levels within normal limits. Target limits for most people are:  Blood glucose level: Less than 100 mg/dL.  Total cholesterol level: Less than 200 mg/dL. ? Maintain a healthy weight. Activity  Stay physically active and exercise regularly. Talk with your health care provider about how often you should exercise and ask which types of exercise are safe for you.  Avoid heavy lifting and activities that take a lot of effort (are strenuous). Ask your health care provider what activities are safe for you. General instructions  Keep all follow-up visits as told by your health care provider. This is important. ? Talk with your health care provider about regular screenings to see if the aneurysm is getting bigger.  Take over-the-counter and prescription medicines only as told by your health care provider. Contact a health care provider if:  You have discomfort in your upper back, neck, or abdomen.  You have trouble swallowing.  You have a cough or hoarseness.  You have a family history of aneurysms.  You have unexplained weight loss. Get help right away if:  You have sudden, severe pain in your upper back and abdomen. This pain may move into your chest and arms.  You have shortness of breath.  You have a fever. This information is not intended to replace advice given to you by your health care provider. Make sure you discuss any questions you have with your health  care provider. Document Released: 04/16/2005 Document Revised: 01/27/2016 Document Reviewed: 01/27/2016 Elsevier Interactive Patient Education  2018 [place].  Aortic Valve Replacement, Care After Refer to this sheet in the next few weeks. These instructions provide you with information about caring for yourself after your procedure. Your health care provider may also give you more specific instructions. Your treatment has been planned according to current medical practices, but problems sometimes occur. Call your health care provider if you have any problems or questions after your procedure. What can I expect after the procedure? After the procedure, it is common to have:  Pain around your incision area.  A small amount of blood or clear fluid coming from your incision.  Follow these instructions at home: Eating and drinking   Follow instructions from your health care provider about eating or drinking restrictions. ? Limit alcohol intake to no more than 1 drink  per day for nonpregnant women and 2 drinks per day for men. One drink equals 12 oz of beer, 5 oz of wine, or 1 oz of hard liquor. ? Limit how much caffeine you drink. Caffeine can affect your heart's rate and rhythm.  Drink enough fluid to keep your urine clear or pale yellow.  Eat a heart-healthy diet. This should include plenty of fresh fruits and vegetables. If you eat meat, it should be lean cuts. Avoid foods that are: ? High in salt, saturated fat, or sugar. ? Canned or highly processed. ? Fried. Activity  Return to your normal activities as told by your health care provider. Ask your health care provider what activities are safe for you.  Exercise regularly once you have recovered, as told by your health care provider.  Avoid sitting for more than 2 hours at a time without moving. Get up and move around at least once every 1-2 hours. This helps to prevent blood clots in the legs.  Do not lift anything that is  heavier than 10 lb (4.5 kg) until your health care provider approves.  Avoid pushing or pulling things with your arms until your health care provider approves. This includes pulling on handrails to help you climb stairs. Incision care   Follow instructions from your health care provider about how to take care of your incision. Make sure you: ? Wash your hands with soap and water before you change your bandage (dressing). If soap and water are not available, use hand sanitizer. ? Change your dressing as told by your health care provider. ? Leave stitches (sutures), skin glue, or adhesive strips in place. These skin closures may need to stay in place for 2 weeks or longer. If adhesive strip edges start to loosen and curl up, you may trim the loose edges. Do not remove adhesive strips completely unless your health care provider tells you to do that.  Check your incision area every day for signs of infection. Check for: ? More redness, swelling, or pain. ? More fluid or blood. ? Warmth. ? Pus or a bad smell. Medicines  Take over-the-counter and prescription medicines only as told by your health care provider.  If you were prescribed an antibiotic medicine, take it as told by your health care provider. Do not stop taking the antibiotic even if you start to feel better. Travel  Avoid airplane travel for as long as told by your health care provider.  When you travel, bring a list of your medicines and a record of your medical history with you. Carry your medicines with you. Driving  Ask your health care provider when it is safe for you to drive. Do not drive until your health care provider approves.  Do not drive or operate heavy machinery while taking prescription pain medicine. Lifestyle   Do not use any tobacco products, such as cigarettes, chewing tobacco, or e-cigarettes. If you need help quitting, ask your health care provider.  Resume sexual activity as told by your health care  provider. Do not use medicines for erectile dysfunction unless your health care provider approves, if this applies.  Work with your health care provider to keep your blood pressure and cholesterol under control, and to manage any other heart conditions that you have.  Maintain a healthy weight. General instructions  Do not take baths, swim, or use a hot tub until your health care provider approves.  Do not strain to have a bowel movement.  Avoid crossing your legs  while sitting down.  Check your temperature every day for a fever. A fever may be a sign of infection.  If you are a woman and you plan to become pregnant, talk with your health care provider before you become pregnant.  Wear compression stockings if your health care provider instructs you to do this. These stockings help to prevent blood clots and reduce swelling in your legs.  Tell all health care providers who care for you that you have an artificial (prosthetic) aortic valve. If you have or have had heart disease or endocarditis, tell all health care providers about these conditions as well.  Keep all follow-up visits as told by your health care provider. This is important. Contact a health care provider if:  You develop a skin rash.  You experience sudden, unexplained changes in your weight.  You have more redness, swelling, or pain around your incision.  You have more fluid or blood coming from your incision.  Your incision feels warm to the touch.  You have pus or a bad smell coming from your incision.  You have a fever. Get help right away if:  You develop chest pain that is different from the pain coming from your incision.  You develop shortness of breath or difficulty breathing.  You start to feel light-headed. These symptoms may represent a serious problem that is an emergency. Do not wait to see if the symptoms will go away. Get medical help right away. Call your local emergency services (911 in the  [place] Do not drive yourself to the hospital. This information is not intended to replace advice given to you by your health care provider. Make sure you discuss any questions you have with your health care provider. Document Released: 11/02/2004 Document Revised: 09/22/2015 Document Reviewed: 03/20/2015 Elsevier Interactive Patient Education  2017 [employer].

## 2017-05-02 NOTE — Consult Note (Addendum)
[address]       [place] [address]             [phone]        [name] Anderson [facility] Medical Record #[id number] Date of Birth: [birth year]  Referring: Dr Stanford Breed  Primary Care: Antony Contras, MD Primary Cardiologist: No primary care provider on file.  Chief Complaint:   Routine follow-up for aortic insufficiency, noted to have significantly more aortic insufficiency, likely endocarditis   History of Present Illness:     Patient is a 62 year old male with known previous cardiac history involvingaortic insufficiency and mildly dilated ascending aorta.  The patient has been followed for several years with serial echocardiograms and CTA of the chest.  About 5 weeks ago and echocardiogram was done  echo suggested more aortic insufficiency.  Repeat CTA of the chest suggested possible splenic infarcts.  TEE was performed because of these findings and suggested possible endocarditis, with increased aortic insufficiency and possible vegetation.  Although the patient did not present because of symptoms, in retrospect he had  felt more fatigued over the past several months, but denies any specific infections, denies fever and chills, has had no dental recent dental work, he has been noted to be anemic and recent upper GI endoscopy and colonoscopy were performed without specific causes of the anemia.   Following the TEE the patient was admitted to 3 blood cultures were obtained and grew Other than fatigue the patient has no significant symptoms of congestive heart failure, he was with a MRI of the brain in early November for 42-monthhistory of dizziness.  MRI was negative for stroke March 12, 2017.  Patient will complete 6 weeks of IV ceFAZolin (ANCEF) IV on January 16.  He is to have dental work done on January 7.  Since discharge from the hospital patient has had a cardiac catheterization that showed no significant coronary artery disease.  He notes that overall he  feels better denies fever chills, shortness of breath is improved.  Current Activity/ Functional Status: Patient is independent with mobility/ambulation, transfers, ADL's, IADL's.   Zubrod Score: At the time of surgery this patient's most appropriate activity status/level should be described as: _0     0    Normal activity, no symptoms _1     1    Restricted in physical strenuous activity but ambulatory, able to do out light work _2     2    Ambulatory and capable of self care, unable to do work activities, up and about                 more than 50%  Of the time                            _3     3    Only limited self care, in bed greater than 50% of waking hours _4     4    Completely disabled, no self care, confined to bed or chair _5     5    Moribund  Past Medical History:  Diagnosis Date  . Allergy   . Anemia   . Barrett's esophagus - short segment 03/13/2017  . Cancer ([place] 09/11/13   testicular  . ED (erectile dysfunction)   . Endocarditis    /[facility]11/30/2018  . H/O aneurysm 03/05/2017  . High cholesterol   . History of basal cell carcinoma excision  02/2002   --  NOSE  &   2013  RIGHT EAR  . History of kidney stones   . Hyperlipidemia   . Mild obstructive sleep apnea    PER STUDY 01-18-2005--  NO CPAP OR MOUTH GUARD  . Moderate aortic valve insufficiency   . 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 Personal history of colonic polyps    TUBULAR ADENOMA--   . Seasonal allergies   . 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[place])   . Testicular mass    RIGHT    Past Surgical History:  Procedure Laterality Date  . COLONOSCOPY  01/24/06, 07/10/11  . EXTRACORPOREAL SHOCK WAVE LITHOTRIPSY    . HERNIA REPAIR    . MOHS SURGERY  02/2002  &  2013   TIP OF NOSE-///     RIGHT EAR  . ORCHIECTOMY Right 09/11/2013   Procedure:  RIGHT ORCHIECTOMY;  Surgeon: Jorja Loa, MD;  Location: [facility];  Service: Urology;  Laterality: Right;  . RIGHT/LEFT HEART  CATH AND CORONARY ANGIOGRAPHY N/A 04/18/2017   Procedure: RIGHT/LEFT HEART CATH AND CORONARY ANGIOGRAPHY;  Surgeon: Sherren Mocha, MD;  Location: [facility];  Service: Cardiovascular;  Laterality: N/A;  . SCROTAL EXPLORATION Right 09/11/2013   Procedure: RIGHT INGUINAL EXPLORATION;  Surgeon: Jorja Loa, MD;  Location: [facility];  Service: Urology;  Laterality: Right;  . TEE WITHOUT CARDIOVERSION N/A 03/29/2017   Procedure: TRANSESOPHAGEAL ECHOCARDIOGRAM (TEE);  Surgeon: Lelon Perla, MD;  Location: [facility] ENDOSCOPY;  Service: Cardiovascular;  Laterality: N/A;  . TRANSTHORACIC ECHOCARDIOGRAM  02-25-2013   DR CRENSHAW   NORMAL LVSF/  EF 55-60%/   GRADE 2 DIASTOLIC DYSFUNCTION/  ECCENTRIC MODERATE AORTIC INSUFFICIENCY WITHOUT STENOSIS/  MILD DILATED AORTIC ROOT/ TRIVIAL MR, TR,  & PR  . UMBILICAL HERNIA REPAIR  06-08-2009  . URETEROSCOPIC LASER LITHOTRIPSY STONE EXTRACTION  2007    Social History   Tobacco Use  Smoking Status Current Some Day Smoker  . Types: Pipe  Smokeless Tobacco Former Systems developer  . Types: Chew  . Quit date: 04/26/2011  Tobacco Comment   OCCASIONAL  PIPE SMOKER  (NEVER SMOKED CIGARETTES)    Social History   Substance and Sexual Activity  Alcohol Use Yes   Comment: 03/29/2017 "might have a glass of wine twice/year"    Social History   Socioeconomic History  . Marital status: Married    Spouse name: Coralyn Mark  . Number of children: 2  . Years of education: 4  . Highest education level: Not on file  Social Needs  . Financial resource strain: Not on file  . Food insecurity - worry: Not on file  . Food insecurity - inability: Not on file  . Transportation needs - medical: Not on file  . Transportation needs - non-medical: Not on file  Occupational History  . 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 Smoking status: Current Some Day Smoker    Types: Pipe  . Smokeless tobacco: Former Systems developer    Types: [place] date:  04/26/2011  . Tobacco comment: OCCASIONAL  PIPE SMOKER  (NEVER SMOKED CIGARETTES)  Substance and Sexual Activity  . Alcohol use: Yes    Comment: 03/29/2017 "might have a glass of wine twice/year"  . Drug use: No  . Sexual activity: Yes  Other Topics Concern  . Not on file  Social History Narrative   Lives with wife Coralyn Mark   Caffeine use: coffee-  1 cup per day    No Known Allergies  Current Outpatient Medications  Medication Sig Dispense Refill  . acetaminophen (TYLENOL) 500 MG tablet Take 1,000 mg by mouth every 6 (six) hours as needed (for pain.).     Marland Kitchen aluminum chloride (HYPERCARE) 20 % external solution Apply 1 application topically 3 (three) times daily as needed (for sweating.).    Marland Kitchen amoxicillin (AMOXIL) 500 MG capsule TAKE 4 TABLETS BY MOUTH 1 HOUR PRIOR TO DENTAL APPOINTMENT 4 capsule 1  . Ascorbic Acid (VITAMIN C) 1000 MG tablet Take 1,000 mg by mouth daily.    Marland Kitchen aspirin 81 MG chewable tablet Chew 81 mg by mouth daily.    Marland Kitchen atorvastatin (LIPITOR) 10 MG tablet Take 1 tablet (10 mg total) by mouth daily at 6 PM. 30 tablet 6  . ceFAZolin (ANCEF) IVPB Inject 2 g into the vein every 8 (eight) hours. Indication:  CONS endocarditis Last Day of Therapy:  05/15/17 Labs - Once weekly:  CBC/D and BMP, Labs - Every other week:  ESR and CRP 126 Units 0  . CVS TRIPLE MAGNESIUM COMPLEX PO Take 1 tablet by mouth 2 (two) times daily.    Marland Kitchen ECHINACEA PO Take 760 mg by mouth daily.    . Ferrous Sulfate (IRON) 325 (65 Fe) MG TABS Take 325 mg by mouth daily.     . Glucosamine-Chondroitin (COSAMIN DS PO) Take 1 tablet by mouth 2 (two) times daily.    Marland Kitchen losartan (COZAAR) 50 MG tablet Take 1 tablet (50 mg total) by mouth daily. 90 tablet 3  . Naphazoline-Pheniramine (OPCON-A) 0.027-0.315 % SOLN Place 1-2 drops into both eyes 3 (three) times daily as needed (for allergy eyes.).    Marland Kitchen NON FORMULARY Take 2 capsules by mouth 2 (two) times daily. DoTerra (Microplex VMz) 2 Capsule twice daily    . NON  FORMULARY Take 2 capsules by mouth 2 (two) times daily. XE0 Mega "Doterra"    . sildenafil (REVATIO) 20 MG tablet Take 20 mg by mouth daily as needed (for ED).     . SUPER B COMPLEX/C PO Take 1 tablet by mouth daily.    Marland Kitchen triamcinolone cream (KENALOG) 0.1 % Apply 1 application topically daily as needed (FOR DRY SKIN/ITCHY SKIN.). Apply to area after bath (DO NOT APPLY TO FACE)  1   No current facility-administered medications for this visit.      (Not in a hospital admission)  Family History  Problem Relation Age of Onset  . Colon cancer Father   . Heart disease Paternal Grandmother      Review of Systems:  Pertinent items are noted in HPI.  Review of Systems  Constitutional: Negative for chills, diaphoresis, fever, malaise/fatigue and weight loss.  HENT: Negative.   Eyes: Negative.   Respiratory: Positive for shortness of breath. Negative for cough, hemoptysis, sputum production and wheezing.   Cardiovascular: Negative for chest pain, palpitations, orthopnea, claudication, leg swelling and PND.  Gastrointestinal: Negative for abdominal pain, blood in stool, constipation, diarrhea, heartburn, melena, nausea and vomiting.  Genitourinary: Negative.   Musculoskeletal: Negative.   Skin: Negative for itching and rash.  Neurological: Negative.  Negative for weakness.  Endo/Heme/Allergies: Negative.   Psychiatric/Behavioral: Negative.        Immunizations: Flu [n]; Pneumococcal[ n ];   Physical Exam: BP (!) 147/66   Pulse 75   Resp 20   Ht _0  (1.803 m)   Wt 206 lb (93.4 kg)   SpO2 98% Comment: RA  BMI 28.73 kg/m   General appearance: alert, cooperative, appears stated age and no distress Head: Normocephalic, without obvious abnormality, atraumatic Neck: no adenopathy, no carotid bruit, no JVD, supple, symmetrical, trachea midline and thyroid not enlarged, symmetric, no tenderness/mass/nodules Lymph nodes: Cervical, supraclavicular, and axillary nodes normal. Resp:  clear to auscultation bilaterally Back: symmetric, no curvature. ROM normal. No CVA tenderness. Cardio: regular rate and rhythm, S1, S2 normal, no murmur, click, rub or gallop GI: soft, non-tender; bowel sounds normal; no masses,  no organomegaly Extremities: extremities normal, atraumatic, no cyanosis or edema Neurologic: Grossly normal Palpable DP and PT pulses  Diagnostic Studies & Laboratory data:    Recent Lab Findings: Lab Results  Component Value Date   WBC 6.9 04/18/2017   HGB 12.8 (L) 04/18/2017   HCT 40.3 04/18/2017   PLT 211 04/18/2017   GLUCOSE 101 (H) 04/18/2017   CHOL 165 03/30/2017   TRIG 91 03/30/2017   HDL 24 (L) 03/30/2017   LDLDIRECT 160.9 02/12/2013   LDLCALC 123 (H) 03/30/2017   ALT 13 (L) 03/29/2017   AST 19 03/29/2017   NA 138 04/18/2017   K 4.3 04/18/2017   CL 105 04/18/2017   CREATININE 1.03 04/18/2017   BUN 18 04/18/2017   CO2 25 04/18/2017   TSH 3.911 03/29/2017   INR 1.00 04/18/2017  Micro:  Results for orders placed or performed during the hospital encounter of 03/29/17  Culture, blood (routine x 2)     Status: Abnormal   Collection Time: 03/29/17 12:30 PM  Result Value Ref Range Status   Specimen Description BLOOD RIGHT ANTECUBITAL  Final   Special Requests   Final    BOTTLES DRAWN AEROBIC ONLY Blood Culture adequate volume   Culture  Setup Time   Final    GRAM POSITIVE COCCI IN CLUSTERS AEROBIC BOTTLE ONLY CRITICAL VALUE NOTED.  VALUE IS CONSISTENT WITH PREVIOUSLY REPORTED AND CALLED VALUE.    Culture (A)  Final    STAPHYLOCOCCUS SPECIES (COAGULASE NEGATIVE) SUSCEPTIBILITIES PERFORMED ON PREVIOUS CULTURE WITHIN THE LAST 5 DAYS.    Report Status 04/02/2017 FINAL  Final  Culture, blood (routine x 2)     Status: Abnormal   Collection Time: 03/29/17 12:32 PM  Result Value Ref Range Status   Specimen Description BLOOD LEFT HAND  Final   Special Requests   Final    BOTTLES DRAWN AEROBIC ONLY Blood Culture adequate volume   Culture   Setup Time   Final    GRAM POSITIVE COCCI IN CLUSTERS AEROBIC BOTTLE ONLY CRITICAL RESULT CALLED TO, READ BACK BY AND VERIFIED WITH: A. JOHNSTON,PHARMD 2056 03/30/2017 T. TYSOR    Culture STAPHYLOCOCCUS SPECIES (COAGULASE NEGATIVE) (A)  Final   Report Status 04/02/2017 FINAL  Final   Organism ID, Bacteria STAPHYLOCOCCUS SPECIES (COAGULASE NEGATIVE)  Final      Susceptibility   Staphylococcus species (coagulase negative) - MIC*    CIPROFLOXACIN <=0.5 SENSITIVE Sensitive     ERYTHROMYCIN >=8 RESISTANT Resistant     GENTAMICIN <=0.5 SENSITIVE Sensitive     OXACILLIN <=0.25 SENSITIVE Sensitive     TETRACYCLINE <=1 SENSITIVE Sensitive     VANCOMYCIN 1 SENSITIVE Sensitive     TRIMETH/SULFA <=10 SENSITIVE Sensitive     CLINDAMYCIN <=0.25 SENSITIVE Sensitive     RIFAMPIN <=0.5 SENSITIVE Sensitive     Inducible Clindamycin NEGATIVE Sensitive     * STAPHYLOCOCCUS SPECIES (COAGULASE NEGATIVE)  Blood Culture ID Panel (Reflexed)     Status: Abnormal   Collection Time: 03/29/17 12:32 PM  Result Value Ref Range Status   Enterococcus species NOT DETECTED NOT DETECTED Final   Listeria monocytogenes NOT DETECTED NOT DETECTED Final   Staphylococcus species DETECTED (A) NOT DETECTED Final    Comment: Methicillin (oxacillin) susceptible coagulase negative staphylococcus. Possible blood culture contaminant (unless isolated from more than one blood culture draw or clinical case suggests pathogenicity). No antibiotic treatment is indicated for blood  culture contaminants. CRITICAL RESULT CALLED TO, READ BACK BY AND VERIFIED WITH: A. JOHNSTON,PHARMD 2056 03/30/2017 T. TYSOR    Staphylococcus aureus NOT DETECTED NOT DETECTED Final   Methicillin resistance NOT DETECTED NOT DETECTED Final   Streptococcus species NOT DETECTED NOT DETECTED Final   Streptococcus agalactiae NOT DETECTED NOT DETECTED Final   Streptococcus pneumoniae NOT DETECTED NOT DETECTED Final   Streptococcus pyogenes NOT DETECTED NOT  DETECTED Final   Acinetobacter baumannii NOT DETECTED NOT DETECTED Final   Enterobacteriaceae species NOT DETECTED NOT DETECTED Final   Enterobacter cloacae complex NOT DETECTED NOT DETECTED Final   Escherichia coli NOT DETECTED NOT DETECTED Final   Klebsiella oxytoca NOT DETECTED NOT DETECTED Final   Klebsiella pneumoniae NOT DETECTED NOT DETECTED Final   Proteus species NOT DETECTED NOT DETECTED Final   Serratia marcescens NOT DETECTED NOT DETECTED Final   Haemophilus influenzae NOT DETECTED NOT DETECTED Final   Neisseria meningitidis NOT DETECTED NOT DETECTED Final   Pseudomonas aeruginosa NOT DETECTED NOT DETECTED Final   Candida albicans NOT DETECTED NOT DETECTED Final   Candida glabrata NOT DETECTED NOT DETECTED Final   Candida krusei NOT DETECTED NOT DETECTED Final   Candida parapsilosis NOT DETECTED NOT DETECTED Final   Candida tropicalis NOT DETECTED NOT DETECTED Final  Culture, blood (routine x 2)     Status: Abnormal   Collection Time: 03/29/17 12:35 PM  Result Value Ref Range Status   Specimen Description BLOOD RIGHT HAND  Final   Special Requests   Final    BOTTLES DRAWN AEROBIC ONLY Blood Culture adequate volume   Culture  Setup Time   Final    GRAM POSITIVE COCCI IN CLUSTERS AEROBIC BOTTLE ONLY CRITICAL VALUE NOTED.  VALUE IS CONSISTENT WITH PREVIOUSLY REPORTED AND CALLED VALUE.    Culture (A)  Final    STAPHYLOCOCCUS SPECIES (COAGULASE NEGATIVE) SUSCEPTIBILITIES PERFORMED ON PREVIOUS CULTURE WITHIN THE LAST 5 DAYS.    Report Status 04/02/2017 FINAL  Final  Culture, blood (routine x 2)     Status: Abnormal   Collection Time: 03/29/17  9:17 PM  Result Value Ref Range Status   Specimen Description BLOOD LEFT ANTECUBITAL  Final   Special Requests Blood Culture adequate volume IN PEDIATRIC BOTTLE  Final   Culture  Setup Time   Final    GRAM POSITIVE COCCI IN PEDIATRIC BOTTLE CRITICAL VALUE NOTED.  VALUE IS CONSISTENT WITH PREVIOUSLY REPORTED AND CALLED VALUE.     Culture (A)  Final    STAPHYLOCOCCUS SPECIES (COAGULASE NEGATIVE) SUSCEPTIBILITIES PERFORMED ON PREVIOUS CULTURE WITHIN THE LAST 5 DAYS.    Report Status 04/02/2017 FINAL  Final  Culture, blood (routine x 2)     Status: Abnormal   Collection Time: 03/29/17  9:21 PM  Result Value Ref Range Status   Specimen Description BLOOD LEFT HAND  Final   Special Requests Blood Culture adequate volume IN PEDIATRIC BOTTLE  Final   Culture  Setup Time   Final    GRAM POSITIVE COCCI IN CLUSTERS IN PEDIATRIC BOTTLE CRITICAL VALUE NOTED.  VALUE IS CONSISTENT WITH PREVIOUSLY REPORTED AND CALLED VALUE.  Culture (A)  Final    STAPHYLOCOCCUS SPECIES (COAGULASE NEGATIVE) SUSCEPTIBILITIES PERFORMED ON PREVIOUS CULTURE WITHIN THE LAST 5 DAYS.    Report Status 04/02/2017 FINAL  Final  Culture, blood (routine x 2)     Status: None   Collection Time: 04/01/17  5:30 AM  Result Value Ref Range Status   Specimen Description BLOOD RIGHT ANTECUBITAL  Final   Special Requests   Final    BOTTLES DRAWN AEROBIC AND ANAEROBIC Blood Culture adequate volume   Culture NO GROWTH 5 DAYS  Final   Report Status 04/06/2017 FINAL  Final  Culture, blood (routine x 2)     Status: None   Collection Time: 04/01/17  5:35 AM  Result Value Ref Range Status   Specimen Description BLOOD LEFT ANTECUBITAL  Final   Special Requests   Final    BOTTLES DRAWN AEROBIC AND ANAEROBIC Blood Culture adequate volume   Culture NO GROWTH 5 DAYS  Final   Report Status 04/06/2017 FINAL  Final     Recent Radiology Findings:   Mr Virgel Paling CW Contrast  Result Date: 03/12/2017 CLINICAL DATA:  Dizziness for 3 months.  Hypertension. EXAM: MRA HEAD WITHOUT CONTRAST TECHNIQUE: Angiographic images of the Circle of Willis were obtained using MRA technique without intravenous contrast. COMPARISON:  None. FINDINGS: Anterior circulation: Slightly smaller right ICA in the setting of relative right A1 hypoplasia. Robust anterior communicating artery.  Vessels are smooth and widely patent. Negative for aneurysm. Posterior circulation: Left dominant vertebral artery. There is symmetric loss of signal in the proximal V4 segments and picas due to partial horizontal course. There is antegrade flow in both vertebral arteries on recent carotid Doppler. The vessels are smooth and widely patent. A small left posterior communicating artery is present. Negative for aneurysm. IMPRESSION: Normal intracranial MRA. Electronically Signed   By: Monte Fantasia M.D.   On: 03/12/2017 09:00   Ct Angio Chest Aorta W &/or Wo Contrast  Result Date: 03/19/2017 CLINICAL DATA:  62 year old male with a history of thoracic aortic aneurysm EXAM: CT ANGIOGRAPHY CHEST WITH CONTRAST TECHNIQUE: Multidetector CT imaging of the chest was performed using the standard protocol during bolus administration of intravenous contrast. Multiplanar CT image reconstructions and MIPs were obtained to evaluate the vascular anatomy. CONTRAST:  116m ISOVUE-370 IOPAMIDOL (ISOVUE-370) INJECTION 76% COMPARISON:  Prior CTA of the chest 03/29/2016 FINDINGS: Cardiovascular: Stable mild aneurysmal dilatation of the aortic root. Precise measurements are challenging giving artifact related to cardiac motion, however the maximal diameter does not exceed 4.7 cm as previously measured. In fact, I suspect the true measurement to be closer to 4.4 cm. The tubular portion of the ascending thoracic aorta remains stable at 3.8 cm. The transverse and descending thoracic aorta are normal in caliber. No significant aortic plaque. Mild left ventricular dilatation. No thrombus visualized within the cardiac chambers or left atrial appendage. Mediastinum/Nodes: Unremarkable CT appearance of the thyroid gland. No suspicious mediastinal or hilar adenopathy. No soft tissue mediastinal mass. The thoracic esophagus is unremarkable. Lungs/Pleura: Lungs are clear. No pleural effusion or pneumothorax. Upper Abdomen: Interval development  of multiple wedge-shaped regions of low attenuation in the superior aspect of the spleen as well as an approximately 6.6 x 5.2 cm low-attenuation region in the inferior aspect of the spleen. These findings are new compared to prior imaging. Musculoskeletal: No chest wall abnormality. No acute or significant osseous findings. Review of the MIP images confirms the above findings. IMPRESSION: 1. Stable mild aneurysmal dilatation of the aortic root which  is no larger than 4.7 cm. I suspect the 4.7 cm measurement may be slightly exaggerated by cardiac motion artifact which is been present across multiple prior studies. The true measurement of the aortic root is likely closer to 4.4 cm. Consider cardiac gated CTA of the next annual follow-up evaluation. 2. Interval development of multiple wedge-shaped regions of low attenuation in the superior aspect of the spleen as well as a new 6.6 cm low-attenuation region in the inferior aspect of the spleen. 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 There is no significant or irregular atherosclerotic plaque in the thoracic, or upper abdominal aorta to serve as a donor site for emboli. Does the patient have a clinical history of atrial fibrillation? Has the patient experienced episodes of left upper quadrant pain over the past several weeks or months? 3. Left ventricular dilatation. Aortic aneurysm NOS (ICD10-I71.9). Signed, Criselda Peaches, MD [facility] Radiology Electronically Signed   By: Jacqulynn Cadet M.D.   On: 03/19/2017 10:07   I have independently reviewed the above radiology studies  and reviewed the findings with the patient.  CATH: Procedures   RIGHT/LEFT HEART CATH AND CORONARY ANGIOGRAPHY  Conclusion   1. Widely patent coronary arteries (right dominant) 2. Normal right and left heart pressures   Coronary Findings   Diagnostic  Dominance: Right  Left Main  Vessel is  angiographically normal.  Left Anterior Descending  Vessel is angiographically normal.  First Diagonal Branch  The LAD is patent to the LV apex without obstructive disease.  Left Circumflex  Large vessel, angiographically normal.  Second Obtuse Marginal Branch  Vessel is angiographically normal.  Right Coronary Artery  Vessel is angiographically normal. Large, dominant vessel, angiographically normal  Hemodynamic Data    Most Recent Value  Fick Cardiac Output 6.78 L/min  Fick Cardiac Output Index 3.32 (L/min)/BSA  RA A Wave 6 mmHg  RA V Wave 4 mmHg  RA Mean 5 mmHg  RV Systolic Pressure 33 mmHg  RV Diastolic Pressure 3 mmHg  RV EDP 9 mmHg  PA Systolic Pressure 29 mmHg  PA Diastolic Pressure 12 mmHg  PA Mean 19 mmHg  PW A Wave 12 mmHg  PW V Wave 12 mmHg  PW Mean 9 mmHg  AO Systolic Pressure 549 mmHg  AO Diastolic Pressure 54 mmHg  AO Mean 80 mmHg  LV Systolic Pressure 826 mmHg  LV Diastolic Pressure 9 mmHg  LV EDP 15 mmHg  Arterial Occlusion Pressure Extended Systolic Pressure 415 mmHg  Arterial Occlusion Pressure Extended Diastolic Pressure 60 mmHg  Arterial Occlusion Pressure Extended Mean Pressure 82 mmHg  Left Ventricular Apex Extended Systolic Pressure 830 mmHg  Left Ventricular Apex Extended Diastolic Pressure 8 mmHg  Left Ventricular Apex Extended EDP Pressure 14 mmHg  QP/QS 0.96  TPVR Index 5.96 HRUI  TSVR Index 24.09 HRUI  PVR SVR Ratio 0.14  TPVR/TSVR Ratio          ECHO: Result status: Edited Result - FINAL                           [facility]                        [address]                        [place], [place] [address]  [phone]  ------------------------------------------------------------------- Echocardiography  (Report amended )  Patient:    [name] Anderson, [name] Anderson MR #:       [id number] Study Date: 03/19/2017 Gender:     M Age:        66 Height:     180.3 cm Weight:     89.8 kg BSA:         2.14 m^2 Pt. Status: Room:   SONOGRAPHER  Cindy Hazy, RDCS  ATTENDING    Ena Dawley, M.D.  PERFORMING   [facility], Outpatient  Joanna Hews [phone]  [place] [facility] [phone]  cc:  ------------------------------------------------------------------- LV EF: 50% -   55%  ------------------------------------------------------------------- Indications:      I35.9 Aortic Valve Disorder.  ------------------------------------------------------------------- History:   PMH:  Acquired from the patient and from the patient&'s chart.  PMH:  Obstructive Sleep Apnea-CPAP. Dilated Aortic Root. Risk factors:  Former tobacco use. Dyslipidemia.  ------------------------------------------------------------------- Study Conclusions  - Left ventricle: The cavity size was severely dilated. Wall   thickness was normal. Systolic function was normal. The estimated   ejection fraction was in the range of 50% to 55%. Wall motion was   normal; there were no regional wall motion abnormalities. Doppler   parameters are consistent with abnormal left ventricular   relaxation (grade 1 diastolic dysfunction). There was no evidence   of elevated ventricular filling pressure by Doppler parameters. - Aortic valve: There was severe regurgitation. - Aortic root: The aortic root was moderately dilated measuring 46   mm. - Ascending aorta: The ascending aorta was upper normal size   measuring 40 mm. - Mitral valve: There was mild regurgitation. - Left atrium: The atrium was mildly dilated. - Right ventricle: The cavity size was normal. Wall thickness was   normal. Systolic function was normal. - Right atrium: The atrium was normal in size. - Inferior vena cava: The vessel was normal in size. - Pericardium, extracardiac: There was no pericardial effusion.  Impressions:  - When compared to the prior study from 04/13/2016 there are   significant changes.   LV size has increased  from normal to moderately to severely   dilated.   LVEF has decreased from 60-65% to 50-55%.   Aortic root remains moderately dilated with now severe aortic   regurgitation.   There is partially flail or redundant chordae of the anterior   mitral valve leaflet with no significant mitral regurgitation.     CT surgery consult should be considered.  ------------------------------------------------------------------- Study data:   Study status:  Routine.  Procedure:  The patient reported no pain pre or post test. Transthoracic echocardiography for left ventricular function evaluation, for right ventricular function evaluation, and for assessment of valvular function. Image quality was adequate.  Study completion:  There were no complications.          Echocardiography.  M-mode, complete 2D, spectral Doppler, and color Doppler.  Birthdate:  Patient birthdate: 07/09/1955.  Age:  Patient is 62 yr old.  Sex:  Gender: male.    BMI: 27.6 kg/m^2.  Blood pressure:     125/64  Patient status:  Outpatient.  Study date:  Study date: 03/19/2017. Study time: 08:42 AM.  Location:  [facility]  -------------------------------------------------------------------  ------------------------------------------------------------------- Left ventricle:  The cavity size was severely dilated. Wall thickness was normal. Systolic function was normal. The estimated ejection fraction was in the range of 50% to 55%. Wall motion was normal; there were no regional wall  motion abnormalities. Doppler parameters are consistent with abnormal left ventricular relaxation (grade 1 diastolic dysfunction). There was no evidence of elevated ventricular filling pressure by Doppler parameters.  ------------------------------------------------------------------- Aortic valve:   Trileaflet; mildly thickened, mildly calcified leaflets. Mobility was not restricted.  Doppler:  Transvalvular velocity was within the normal  range. There was no stenosis. There was severe regurgitation.    VTI ratio of LVOT to aortic valve: 0.73. Valve area (VTI): 4.47 cm^2. Indexed valve area (VTI): 2.09 cm^2/m^2. Peak velocity ratio of LVOT to aortic valve: 0.68. Valve area (Vmax): 4.19 cm^2. Indexed valve area (Vmax): 1.96 cm^2/m^2. Mean velocity ratio of LVOT to aortic valve: 0.66. Valve area (Vmean): 4.05 cm^2. Indexed valve area (Vmean): 1.89 cm^2/m^2. Mean gradient (S): 7 mm Hg. Peak gradient (S): 12 mm Hg.  ------------------------------------------------------------------- Aorta:  Aortic root: The aortic root was moderately dilated measuring 46 mm. Ascending aorta: The ascending aorta was upper normal size measuring 40 mm.  ------------------------------------------------------------------- Mitral valve:   Structurally normal valve.   Mobility was not restricted.  Doppler:  Transvalvular velocity was within the normal range. There was no evidence for stenosis. There was mild regurgitation.  ------------------------------------------------------------------- Left atrium:  The atrium was mildly dilated.  ------------------------------------------------------------------- Right ventricle:  The cavity size was normal. Wall thickness was normal. Systolic function was normal.  ------------------------------------------------------------------- Pulmonic valve:    Structurally normal valve.   Cusp separation was normal.  Doppler:  Transvalvular velocity was within the normal range. There was no evidence for stenosis. There was trivial regurgitation.  ------------------------------------------------------------------- Tricuspid valve:   Structurally normal valve.    Doppler: Transvalvular velocity was within the normal range. There was no regurgitation.  ------------------------------------------------------------------- Pulmonary artery:   The main pulmonary artery was normal-sized. Systolic pressure could  not be accurately estimated.  ------------------------------------------------------------------- Right atrium:  The atrium was normal in size.  ------------------------------------------------------------------- Pericardium:  There was no pericardial effusion.  ------------------------------------------------------------------- Systemic veins: Inferior vena cava: The vessel was normal in size.  ------------------------------------------------------------------- Measurements   Left ventricle                            Value          Reference  LV ID, ED, PLAX chordal           (H)     64    mm       43 - 52  LV ID, ES, PLAX chordal           (H)     46.1  mm       23 - 38  LV fx shortening, PLAX chordal    (L)     28    %        >=29  LV PW thickness, ED                       10    mm       ---------  IVS/LV PW ratio, ED                       0.9            <=1.3  Stroke volume, 2D                         161   ml       ---------  Stroke volume/bsa,  2D                     75    ml/m^2   ---------  LV e&', lateral                            5.59  cm/s     ---------  LV E/e&', lateral                          10.41          ---------  LV e&', medial                             8.77  cm/s     ---------  LV E/e&', medial                           6.64           ---------  LV e&', average                            7.18  cm/s     ---------  LV E/e&', average                          8.11           ---------    Ventricular septum                        Value          Reference  IVS thickness, ED                         9.01  mm       ---------    LVOT                                      Value          Reference  LVOT ID, S                                28    mm       ---------  LVOT area                                 6.16  cm^2     ---------  LVOT ID                                   28    mm       ---------  LVOT peak velocity, S                     119   cm/s     ---------   LVOT mean velocity, S                     85.4  cm/s     ---------  LVOT VTI, S                               26.1  cm       ---------  LVOT peak gradient, S                     6     mm Hg    ---------  Stroke volume (SV), LVOT DP               160.7 ml       ---------  Stroke index (SV/bsa), LVOT DP            75.2  ml/m^2   ---------    Aortic valve                              Value          Reference  Aortic valve peak velocity, S             175   cm/s     ---------  Aortic valve mean velocity, S             130   cm/s     ---------  Aortic valve VTI, S                       36    cm       ---------  Aortic mean gradient, S                   7     mm Hg    ---------  Aortic peak gradient, S                   12    mm Hg    ---------  VTI ratio, LVOT/AV                        0.73           ---------  Aortic valve area, VTI                    4.47  cm^2     ---------  Aortic valve area/bsa, VTI                2.09  cm^2/m^2 ---------  Velocity ratio, peak, LVOT/AV             0.68           ---------  Aortic valve area, peak velocity          4.19  cm^2     ---------  Aortic valve area/bsa, peak               1.96  cm^2/m^2 ---------  velocity  Velocity ratio, mean, LVOT/AV             0.66           ---------  Aortic valve area, mean velocity          4.05  cm^2     ---------  Aortic valve area/bsa, mean               1.89  cm^2/m^2 ---------  velocity  Aortic regurg pressure half-time  356   ms       ---------    Aorta                                     Value          Reference  Aortic root ID, ED                        46    mm       ---------  Ascending aorta ID, A-P, S                40    mm       ---------    Left atrium                               Value          Reference  LA ID, A-P, ES                            40    mm       ---------  LA ID/bsa, A-P                            1.87  cm/m^2   <=2.2  LA volume, S                              60    ml        ---------  LA volume/bsa, S                          28.1  ml/m^2   ---------  LA volume, ES, 1-p A4C                    54    ml       ---------  LA volume/bsa, ES, 1-p A4C                25.3  ml/m^2   ---------  LA volume, ES, 1-p A2C                    62    ml       ---------  LA volume/bsa, ES, 1-p A2C                29    ml/m^2   ---------    Mitral valve                              Value          Reference  Mitral E-wave peak velocity               58.2  cm/s     ---------  Mitral A-wave peak velocity               76    cm/s     ---------  Mitral deceleration time          (H)     271   ms  150 - 230  Mitral E/A ratio, peak                    0.8            ---------    Right ventricle                           Value          Reference  RV s&', lateral, S                         20.8  cm/s     ---------  Legend: (L)  and  (H)  mark values outside specified reference range.  ------------------------------------------------------------------- Lance Morin, M.D. 2018-11-20T17:55:38   TEE: *[facility]*                         [address], [place] [address]                            [phone]  ------------------------------------------------------------------- Transesophageal Echocardiography  Patient:    [name] Anderson, [name] Anderson MR #:       [id number] Study Date: 03/29/2017 Gender:     M Age:        26 Height:     180.3 cm Weight:     88.2 kg BSA:        2.12 m^2 Pt. Status: Room:   ADMITTING    Kirk Ruths  ATTENDING    Kirk Ruths  ORDERING     Kirk Ruths  PERFORMING   Kirk Ruths  REFERRING    Kirk Ruths  SONOGRAPHER  Jannett Celestine, RDCS  cc:  ------------------------------------------------------------------- LV EF: 55% -   60%  ------------------------------------------------------------------- History:   PMH:  Aortic Valve  Disease.  Risk factors: Dyslipidemia.  ------------------------------------------------------------------- Study Conclusions  - Left ventricle: Systolic function was normal. The estimated   ejection fraction was in the range of 55% to 60%. Wall motion was   normal; there were no regional wall motion abnormalities. - Aortic valve: There was a small vegetation on the left   ventricular aspect of the left coronary cusp. There was severe   regurgitation. - Mitral valve: No evidence of vegetation. There was mild   regurgitation. - Right atrium: No evidence of thrombus in the atrial cavity or   appendage. - Tricuspid valve: No evidence of vegetation. - Pulmonic valve: No evidence of vegetation.  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  ------------------------------------------------------------------- Study data:   Study status:  Routine.  Consent:  The risks, benefits, and alternatives to the procedure were explained to the patient and informed consent was obtained.  Procedure:  The patient reported no pain pre or post test. Initial setup. The patient was brought to the laboratory. Surface ECG leads were  monitored. Sedation. Sedation was administered by cardiology staff. Transesophageal echocardiography. A transesophageal probe was inserted by the attending cardiologist. Image quality was adequate.  Study completion:  The patient tolerated the procedure well. There were no complications.          Diagnostic transesophageal echocardiography.  2D and color Doppler.  Birthdate:  Patient birthdate: [birth year].  Age:  Patient is 62 yr old.  Sex:  Gender: male.    BMI: 27.1 kg/m^2.  Blood pressure:     117/50  Patient status:   Outpatient.  Study date:  Study date: 03/29/2017. Study time: 10:58 AM.  Location:  Endoscopy.  -------------------------------------------------------------------  ------------------------------------------------------------------- Left ventricle:  Systolic function was normal. The estimated ejection fraction was in the range of 55% to 60%. Wall motion was normal; there were no regional wall motion abnormalities.  ------------------------------------------------------------------- Aortic valve:   Trileaflet; mildly thickened leaflets. There was a small vegetation on the left ventricular aspect of the left coronary cusp.  Doppler:  There was severe regurgitation.  ------------------------------------------------------------------- Aorta:  Descending aorta: The descending aorta had mild diffuse disease.  ------------------------------------------------------------------- Mitral valve:   Mildly thickened leaflets . Leaflet separation was normal.  No evidence of vegetation.  Doppler:  There was mild regurgitation.  ------------------------------------------------------------------- Left atrium:  The atrium was normal in size.  ------------------------------------------------------------------- Right ventricle:  The cavity size was normal. Systolic function was normal.  ------------------------------------------------------------------- Pulmonic valve:    Structurally normal valve.   Cusp separation was normal.  No evidence of vegetation.  ------------------------------------------------------------------- Tricuspid valve:   Structurally normal valve.   Leaflet separation was normal.  No evidence of vegetation.  Doppler:  There was mild regurgitation.  ------------------------------------------------------------------- Right atrium:  The atrium was normal in size.  No evidence of thrombus in the atrial cavity or  appendage.  ------------------------------------------------------------------- Pericardium:  There was no pericardial effusion.   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 - RESECTION MARGINS, NEGATIVE FOR MALIGNANCY. - PLEASE SEE ONCOLOGY TEMPLATE FOR DETAILS. Microscopic Comment ONCOLOGY TABLE - TESTIS 1. Specimen and laterality: Right testis 2. Tumor focality: Unifocal 3. Macroscopic extent of tumor: Limited to testicular parenchyma. 4. Maximum tumor size (cm): 6.3 cm 5. Histologic type: Seminoma 6. Microscopic tumor extension: Limited to testis 7. Spermatic cord and surgical margins: Negative 8. Lymph-Vascular invasion: Present 9. Intratubular germ cell neoplasia: Present 10. Lymph nodes: # examined: N/A; # positive: N/A 11. TNM code: pT2, pNX 12. Serum tumor markers: See patient's medical record 13. Comment: The cut surfaces of the tumor show a 6.3 cm well circumscribed tan soft mass. Microscopically, sections show uniform malignant cells with prominent nucleoli, abundant cytoplasm with mitotic activity and tumor necrosis. Angiolymphatic invasion is present. The tumor is limited to testis. 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 Immunohistochemical stains were performed and the neoplastic cells show the following immunoprofile: CD117 - negative OCT4- positive EMA- negative CK AE1/AE3- negative PLAP - positive CD30 - virtually negative CD20 - negative AFP - negative 1 of 2 FINAL for MADOC, HOLQUIN ([id number]) Microscopic Comment(continued) HCG- negative The control stained appropriately. The overall morphologic and immunohistochemical finding are diagnostic for seminoma. The final resection margin is negative. (HCL:kh 09-15-13) H. CATHERINE LI  MD   Aortic Size Index=     4.7    /Body surface area is 2.16 meters squared. = 2.2  < 2.75 cm/m2      4% risk per year  2.75 to 4.25          8% risk per year > 4.25 cm/m2    20% risk per year   Assessment / Plan:    1/Significant aortic insufficiency without overt symptoms of congestive heart failure but with evidence of left ventricular dilatation and relatively well preserved LV function.  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  The patient will complete 6 weeks of IV antibiotics January 16.  I plan to see him back on January 24.  We discussed proceeding with replacement of aortic valve and a sending aorta February 4.  When he is seen on January 24 we will plan repeat blood cultures to ensure negativity before proceeding with valve replacement February 4.  Risks and options of surgery were discussed with the patient and his wife in detail.  We also discussed the pros and cons of mechanical versus tissue valves.  The patient prefers a tissue valve to avoid long-term Coumadin.   The goals risks and alternatives of the planned surgical procedure  have been discussed with the patient in detail. The risks of the procedure including death, infection, heart block and need for permanent  pacemaker  stroke, myocardial infarction, bleeding, blood transfusion have all been discussed specifically.  [facility] is willing  to proceed with the planned  procedure.    [name] MD      [address] [place] [address] Office [phone]   Philip  05/02/2017 1:38 PM

## 2017-05-02 NOTE — Progress Notes (Signed)
[address]  [place] [address]  [phone]  [name] Anderson  [facility] Medical Record #[id number]  Date of Birth: [birth year]  Referring: Dr Stanford Breed  Primary Care: Antony Contras, MD  Primary Cardiologist: No primary care provider on file.  Chief Complaint: Routine follow-up for aortic insufficiency, noted to have significantly more aortic insufficiency, likely endocarditis  History of Present Illness:  Patient is a 62 year old male with known previous cardiac history involvingaortic insufficiency and mildly dilated ascending aorta. The patient has been followed for several years with serial echocardiograms and CTA of the chest. About 5 weeks ago and echocardiogram was done echo suggested more aortic insufficiency. Repeat CTA of the chest suggested possible splenic infarcts. TEE was performed because of these findings and suggested possible endocarditis, with increased aortic insufficiency and possible vegetation. Although the patient did not present because of symptoms, in retrospect he had felt more fatigued over the past several months, but denies any specific infections, denies fever and chills, has had no dental recent dental work, he has been noted to be anemic and recent upper GI endoscopy and colonoscopy were performed without specific causes of the anemia.  Following the TEE the patient was admitted to 3 blood cultures were obtained and grew  Other than fatigue the patient has no significant symptoms of congestive heart failure, he was with a MRI of the brain in early November for 55-monthhistory of dizziness. MRI was negative for stroke March 12, 2017.  Patient will complete 6 weeks of IV ceFAZolin (ANCEF) IV on January 16. He is to have dental work done on January 7. Since discharge from the hospital patient has had a cardiac catheterization that showed no significant coronary artery disease. He notes that overall he feels better denies fever chills, shortness of breath  is improved.  Current Activity/ Functional Status:  Patient is independent with mobility/ambulation, transfers, ADL's, IADL's.  Zubrod Score:  At the time of surgery this patient's most appropriate activity status/level should be described as:  0 Normal activity, no symptoms  1 Restricted in physical strenuous activity but ambulatory, able to do out light work  2 Ambulatory and capable of self care, unable to do work activities, up and about more than 50% Of the time  3 Only limited self care, in bed greater than 50% of waking hours  4 Completely disabled, no self care, confined to bed or chair  5 Moribund      Past Medical History:  Diagnosis Date  . Allergy   . Anemia   . Barrett's esophagus - short segment 03/13/2017  . [place] 09[place]   testicular  . ED (erectile dysfunction)   . Endocarditis    /[facility]11/30/2018  . H/O aneurysm 03/05/2017  . High cholesterol   . History of basal cell carcinoma excision    02/2002 -- NOSE & 2013 RIGHT EAR  . History of kidney stones   . Hyperlipidemia   . Mild obstructive sleep apnea    PER STUDY 01-18-2005-- NO CPAP OR MOUTH GUARD  . Moderate aortic valve insufficiency   . Multiple pulmonary nodules    PER CT-- PROBABLE GRANULOMATOUS  . Personal history of colonic polyps    TUBULAR ADENOMA--   . Seasonal allergies   . 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[facility]   . Testicular mass    RIGHT        Past Surgical History:  Procedure Laterality Date  . COLONOSCOPY  01/24/06, 07/10/11  . EXTRACORPOREAL SHOCK WAVE LITHOTRIPSY    .  HERNIA REPAIR    . MOHS SURGERY  02/2002 & 2013   TIP OF NOSE-/// RIGHT EAR  . ORCHIECTOMY Right 09/11/2013   Procedure: RIGHT ORCHIECTOMY; Surgeon: Jorja Loa, MD; Location: [facility]; Service: Urology; Laterality: Right;  . RIGHT/LEFT HEART CATH AND CORONARY ANGIOGRAPHY N/A 04/18/2017   Procedure: RIGHT/LEFT HEART CATH AND CORONARY ANGIOGRAPHY; Surgeon: Sherren Mocha, MD; Location: [facility]; Service: Cardiovascular; Laterality: N/A;  . SCROTAL EXPLORATION Right 09/11/2013   Procedure: RIGHT INGUINAL EXPLORATION; Surgeon: Jorja Loa, MD; [facility]; Service: Urology; Laterality: Right;  . TEE WITHOUT CARDIOVERSION N/A 03/29/2017   Procedure: TRANSESOPHAGEAL ECHOCARDIOGRAM (TEE); Surgeon: Lelon Perla, MD; Location: [facility] ENDOSCOPY; Service: Cardiovascular; Laterality: N/A;  . TRANSTHORACIC ECHOCARDIOGRAM  02-25-2013 DR CRENSHAW   NORMAL LVSF/ EF 36-14%/ GRADE 2 DIASTOLIC DYSFUNCTION/ ECCENTRIC MODERATE AORTIC INSUFFICIENCY WITHOUT STENOSIS/ MILD DILATED AORTIC ROOT/ TRIVIAL MR, TR, & PR  . UMBILICAL HERNIA REPAIR  06-08-2009  . URETEROSCOPIC LASER LITHOTRIPSY STONE EXTRACTION  2007   Social History       Tobacco Use  Smoking Status Current Some Day Smoker  . Types: Pipe  Smokeless Tobacco Former Systems developer  . Types: Chew  . Quit date: 04/26/2011  Tobacco Comment   OCCASIONAL PIPE SMOKER (NEVER SMOKED CIGARETTES)   Social History       Substance and Sexual Activity  Alcohol Use Yes   Comment: 03/29/2017 "might have a glass of wine twice/year"   Social History        Socioeconomic History  . Marital status: Married    Spouse name: Coralyn Mark  . Number of children: 2  . Years of education: 88  . Highest education level: Not on file  Social Needs  . Financial resource strain: Not on file  . Food insecurity - worry: Not on file  . Food insecurity - inability: Not on file  . Transportation needs - medical: Not on file  . Transportation needs - non-medical: Not on file  Occupational History  . 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 Smoking status: Current Some Day Smoker    Types: Pipe  . Smokeless tobacco: Former Systems developer    Types: [place] date: 04/26/2011  . Tobacco comment: OCCASIONAL PIPE SMOKER (NEVER SMOKED CIGARETTES)  Substance and Sexual Activity  . Alcohol use: Yes    Comment: 03/29/2017 "might have a glass  of wine twice/year"  . Drug use: No  . Sexual activity: Yes  Other Topics Concern  . Not on file  Social History Narrative   Lives with wife Coralyn Mark   Caffeine use: coffee- 1 cup per day   No Known Allergies        Current Outpatient Medications  Medication Sig Dispense Refill  . acetaminophen (TYLENOL) 500 MG tablet Take 1,000 mg by mouth every 6 (six) hours as needed (for pain.).     Marland Kitchen aluminum chloride (HYPERCARE) 20 % external solution Apply 1 application topically 3 (three) times daily as needed (for sweating.).    Marland Kitchen amoxicillin (AMOXIL) 500 MG capsule TAKE 4 TABLETS BY MOUTH 1 HOUR PRIOR TO DENTAL APPOINTMENT 4 capsule 1  . Ascorbic Acid (VITAMIN C) 1000 MG tablet Take 1,000 mg by mouth daily.    Marland Kitchen aspirin 81 MG chewable tablet Chew 81 mg by mouth daily.    Marland Kitchen atorvastatin (LIPITOR) 10 MG tablet Take 1 tablet (10 mg total) by mouth daily at 6 PM. 30 tablet 6  .  ceFAZolin (ANCEF) IVPB Inject 2 g into the vein every 8 (eight) hours. Indication: CONS endocarditis  Last Day of Therapy: 05/15/17  Labs - Once weekly: CBC/D and BMP,  Labs - Every other week: ESR and CRP 126 Units 0  . CVS TRIPLE MAGNESIUM COMPLEX PO Take 1 tablet by mouth 2 (two) times daily.    Marland Kitchen ECHINACEA PO Take 760 mg by mouth daily.    . Ferrous Sulfate (IRON) 325 (65 Fe) MG TABS Take 325 mg by mouth daily.     . Glucosamine-Chondroitin (COSAMIN DS PO) Take 1 tablet by mouth 2 (two) times daily.    Marland Kitchen losartan (COZAAR) 50 MG tablet Take 1 tablet (50 mg total) by mouth daily. 90 tablet 3  . Naphazoline-Pheniramine (OPCON-A) 0.027-0.315 % SOLN Place 1-2 drops into both eyes 3 (three) times daily as needed (for allergy eyes.).    Marland Kitchen NON FORMULARY Take 2 capsules by mouth 2 (two) times daily. DoTerra (Microplex VMz) 2 Capsule twice daily    . NON FORMULARY Take 2 capsules by mouth 2 (two) times daily. XE0 Mega "Doterra"    . sildenafil (REVATIO) 20 MG tablet Take 20 mg by mouth daily as needed (for ED).     . SUPER B  COMPLEX/C PO Take 1 tablet by mouth daily.    Marland Kitchen triamcinolone cream (KENALOG) 0.1 % Apply 1 application topically daily as needed (FOR DRY SKIN/ITCHY SKIN.). Apply to area after bath (DO NOT APPLY TO FACE)  1   No current facility-administered medications for this visit.   (Not in a hospital admission)       Family History  Problem Relation Age of Onset  . Colon cancer Father   . Heart disease Paternal Grandmother    Review of Systems:  Pertinent items are noted in HPI.  Review of Systems  Constitutional: Negative for chills, diaphoresis, fever, malaise/fatigue and weight loss.  HENT: Negative.  Eyes: Negative.  Respiratory: Positive for shortness of breath. Negative for cough, hemoptysis, sputum production and wheezing.  Cardiovascular: Negative for chest pain, palpitations, orthopnea, claudication, leg swelling and PND.  Gastrointestinal: Negative for abdominal pain, blood in stool, constipation, diarrhea, heartburn, melena, nausea and vomiting.  Genitourinary: Negative.  Musculoskeletal: Negative.  Skin: Negative for itching and rash.  Neurological: Negative. Negative for weakness.  Endo/Heme/Allergies: Negative.  Psychiatric/Behavioral: Negative.   Immunizations: Flu [n]; Pneumococcal[ n ];  Physical Exam:  BP (!) 147/66  Pulse 75  Resp 20  Ht 5' 11" (1.803 m)  Wt 206 lb (93.4 kg)  SpO2 98% Comment: RA  BMI 28.73 kg/m  General appearance: alert, cooperative, appears stated age and no distress  Head: Normocephalic, without obvious abnormality, atraumatic  Neck: no adenopathy, no carotid bruit, no JVD, supple, symmetrical, trachea midline and thyroid not enlarged, symmetric, no tenderness/mass/nodules  Lymph nodes: Cervical, supraclavicular, and axillary nodes normal.  Resp: clear to auscultation bilaterally  Back: symmetric, no curvature. ROM normal. No CVA tenderness.  Cardio: regular rate and rhythm, S1, S2 normal, no murmur, click, rub or gallop  GI: soft,  non-tender; bowel sounds normal; no masses, no organomegaly  Extremities: extremities normal, atraumatic, no cyanosis or edema  Neurologic: Grossly normal  Palpable DP and PT pulses  Diagnostic Studies & Laboratory data:  Recent Lab Findings:  Recent Labs  Micro:         Results for orders placed or performed during the hospital encounter of 03/29/17  Culture, blood (routine x 2) Status: Abnormal   Collection Time: 03/29/17 12:30 PM  Result Value Ref Range Status   Specimen Description BLOOD RIGHT ANTECUBITAL  Final   Special Requests   Final    BOTTLES DRAWN AEROBIC ONLY Blood Culture adequate volume   Culture Setup Time   Final    GRAM POSITIVE COCCI IN CLUSTERS  AEROBIC BOTTLE ONLY  CRITICAL VALUE NOTED. VALUE IS CONSISTENT WITH PREVIOUSLY REPORTED AND CALLED VALUE.    Culture (A)  Final    STAPHYLOCOCCUS SPECIES (COAGULASE NEGATIVE)  SUSCEPTIBILITIES PERFORMED ON PREVIOUS CULTURE WITHIN THE LAST 5 DAYS.    Report Status 04/02/2017 FINAL  Final  Culture, blood (routine x 2) Status: Abnormal   Collection Time: 03/29/17 12:32 PM  Result Value Ref Range Status   Specimen Description BLOOD LEFT HAND  Final   Special Requests   Final    BOTTLES DRAWN AEROBIC ONLY Blood Culture adequate volume   Culture Setup Time   Final    GRAM POSITIVE COCCI IN CLUSTERS  AEROBIC BOTTLE ONLY  CRITICAL RESULT CALLED TO, READ BACK BY AND VERIFIED WITH: A. JOHNSTON,PHARMD 2056 03/30/2017 T. TYSOR    Culture STAPHYLOCOCCUS SPECIES (COAGULASE NEGATIVE) (A)  Final   Report Status 04/02/2017 FINAL  Final   Organism ID, Bacteria STAPHYLOCOCCUS SPECIES (COAGULASE NEGATIVE)  Final  Susceptibility   Staphylococcus species (coagulase negative) - MIC*    CIPROFLOXACIN <=0.5 SENSITIVE Sensitive     ERYTHROMYCIN >=8 RESISTANT Resistant     GENTAMICIN  <=0.5 SENSITIVE Sensitive     OXACILLIN <=0.25 SENSITIVE Sensitive     TETRACYCLINE <=1 SENSITIVE Sensitive     VANCOMYCIN 1 SENSITIVE Sensitive     TRIMETH/SULFA <=10 SENSITIVE Sensitive     CLINDAMYCIN <=0.25 SENSITIVE Sensitive     RIFAMPIN <=0.5 SENSITIVE Sensitive     Inducible Clindamycin NEGATIVE Sensitive    * STAPHYLOCOCCUS SPECIES (COAGULASE NEGATIVE)  Blood Culture ID Panel (Reflexed) Status: Abnormal   Collection Time: 03/29/17 12:32 PM  Result Value Ref Range Status   Enterococcus species NOT DETECTED NOT DETECTED Final   Listeria monocytogenes NOT DETECTED NOT DETECTED Final   Staphylococcus species DETECTED (A) NOT DETECTED Final    Comment: Methicillin (oxacillin) susceptible coagulase negative staphylococcus. Possible blood culture contaminant (unless isolated from more than one blood culture draw or clinical case suggests pathogenicity). No antibiotic treatment is indicated for blood  culture contaminants.  CRITICAL RESULT CALLED TO, READ BACK BY AND VERIFIED WITH:  A. JOHNSTON,PHARMD 2056 03/30/2017 T. TYSOR    Staphylococcus aureus NOT DETECTED NOT DETECTED Final   Methicillin resistance NOT DETECTED NOT DETECTED Final   Streptococcus species NOT DETECTED NOT DETECTED Final   Streptococcus agalactiae NOT DETECTED NOT DETECTED Final   Streptococcus pneumoniae NOT DETECTED NOT DETECTED Final   Streptococcus pyogenes NOT DETECTED NOT DETECTED Final   Acinetobacter baumannii NOT DETECTED NOT DETECTED Final   Enterobacteriaceae species NOT DETECTED NOT DETECTED Final   Enterobacter cloacae complex NOT DETECTED NOT DETECTED Final   Escherichia coli NOT DETECTED NOT DETECTED Final   Klebsiella oxytoca NOT DETECTED NOT DETECTED Final   Klebsiella pneumoniae NOT DETECTED NOT DETECTED Final   Proteus species NOT DETECTED NOT DETECTED Final   Serratia marcescens NOT DETECTED NOT DETECTED Final   Haemophilus influenzae NOT DETECTED NOT DETECTED Final   Neisseria meningitidis  NOT DETECTED NOT DETECTED Final  Pseudomonas aeruginosa NOT DETECTED NOT DETECTED Final   Candida albicans NOT DETECTED NOT DETECTED Final   Candida glabrata NOT DETECTED NOT DETECTED Final   Candida krusei NOT DETECTED NOT DETECTED Final   Candida parapsilosis NOT DETECTED NOT DETECTED Final   Candida tropicalis NOT DETECTED NOT DETECTED Final  Culture, blood (routine x 2) Status: Abnormal   Collection Time: 03/29/17 12:35 PM  Result Value Ref Range Status   Specimen Description BLOOD RIGHT HAND  Final   Special Requests   Final    BOTTLES DRAWN AEROBIC ONLY Blood Culture adequate volume   Culture Setup Time   Final    GRAM POSITIVE COCCI IN CLUSTERS  AEROBIC BOTTLE ONLY  CRITICAL VALUE NOTED. VALUE IS CONSISTENT WITH PREVIOUSLY REPORTED AND CALLED VALUE.    Culture (A)  Final    STAPHYLOCOCCUS SPECIES (COAGULASE NEGATIVE)  SUSCEPTIBILITIES PERFORMED ON PREVIOUS CULTURE WITHIN THE LAST 5 DAYS.    Report Status 04/02/2017 FINAL  Final  Culture, blood (routine x 2) Status: Abnormal   Collection Time: 03/29/17 9:17 PM  Result Value Ref Range Status   Specimen Description BLOOD LEFT ANTECUBITAL  Final   Special Requests Blood Culture adequate volume IN PEDIATRIC BOTTLE  Final   Culture Setup Time   Final    GRAM POSITIVE COCCI  IN PEDIATRIC BOTTLE  CRITICAL VALUE NOTED. VALUE IS CONSISTENT WITH PREVIOUSLY REPORTED AND CALLED VALUE.    Culture (A)  Final    STAPHYLOCOCCUS SPECIES (COAGULASE NEGATIVE)  SUSCEPTIBILITIES PERFORMED ON PREVIOUS CULTURE WITHIN THE LAST 5 DAYS.    Report Status 04/02/2017 FINAL  Final  Culture, blood (routine x 2) Status: Abnormal   Collection Time: 03/29/17 9:21 PM  Result Value Ref Range Status   Specimen Description BLOOD LEFT HAND  Final   Special Requests Blood Culture adequate volume IN PEDIATRIC BOTTLE  Final   Culture Setup Time   Final    GRAM POSITIVE COCCI IN CLUSTERS  IN PEDIATRIC BOTTLE  CRITICAL VALUE NOTED. VALUE IS CONSISTENT WITH  PREVIOUSLY REPORTED AND CALLED VALUE.    Culture (A)  Final    STAPHYLOCOCCUS SPECIES (COAGULASE NEGATIVE)  SUSCEPTIBILITIES PERFORMED ON PREVIOUS CULTURE WITHIN THE LAST 5 DAYS.    Report Status 04/02/2017 FINAL  Final  Culture, blood (routine x 2) Status: None   Collection Time: 04/01/17 5:30 AM  Result Value Ref Range Status   Specimen Description BLOOD RIGHT ANTECUBITAL  Final   Special Requests   Final    BOTTLES DRAWN AEROBIC AND ANAEROBIC Blood Culture adequate volume   Culture NO GROWTH 5 DAYS  Final   Report Status 04/06/2017 FINAL  Final  Culture, blood (routine x 2) Status: None   Collection Time: 04/01/17 5:35 AM  Result Value Ref Range Status   Specimen Description BLOOD LEFT ANTECUBITAL  Final   Special Requests   Final    BOTTLES DRAWN AEROBIC AND ANAEROBIC Blood Culture adequate volume   Culture NO GROWTH 5 DAYS  Final   Report Status 04/06/2017 FINAL  Final   Recent Radiology Findings:  Mr Virgel Paling PO Contrast  Result Date: 03/12/2017  CLINICAL DATA: Dizziness for 3 months. Hypertension. EXAM: MRA HEAD WITHOUT CONTRAST TECHNIQUE: Angiographic images of the Circle of Willis were obtained using MRA technique without intravenous contrast. COMPARISON: None. FINDINGS: Anterior circulation: Slightly smaller right ICA in the setting of relative right A1 hypoplasia. Robust anterior communicating artery. Vessels are smooth and widely patent. Negative for aneurysm. Posterior circulation: Left dominant vertebral artery. There is  symmetric loss of signal in the proximal V4 segments and picas due to partial horizontal course. There is antegrade flow in both vertebral arteries on recent carotid Doppler. The vessels are smooth and widely patent. A small left posterior communicating artery is present. Negative for aneurysm. IMPRESSION: Normal intracranial MRA. Electronically Signed By: Monte Fantasia M.D. On: 03/12/2017 09:00  Ct Angio Chest Aorta W &/or Wo Contrast  Result Date:  03/19/2017  CLINICAL DATA: 62 year old male with a history of thoracic aortic aneurysm EXAM: CT ANGIOGRAPHY CHEST WITH CONTRAST TECHNIQUE: Multidetector CT imaging of the chest was performed using the standard protocol during bolus administration of intravenous contrast. Multiplanar CT image reconstructions and MIPs were obtained to evaluate the vascular anatomy. CONTRAST: 172m ISOVUE-370 IOPAMIDOL (ISOVUE-370) INJECTION 76% COMPARISON: Prior CTA of the chest 03/29/2016 FINDINGS: Cardiovascular: Stable mild aneurysmal dilatation of the aortic root. Precise measurements are challenging giving artifact related to cardiac motion, however the maximal diameter does not exceed 4.7 cm as previously measured. In fact, I suspect the true measurement to be closer to 4.4 cm. The tubular portion of the ascending thoracic aorta remains stable at 3.8 cm. The transverse and descending thoracic aorta are normal in caliber. No significant aortic plaque. Mild left ventricular dilatation. No thrombus visualized within the cardiac chambers or left atrial appendage. Mediastinum/Nodes: Unremarkable CT appearance of the thyroid gland. No suspicious mediastinal or hilar adenopathy. No soft tissue mediastinal mass. The thoracic esophagus is unremarkable. Lungs/Pleura: Lungs are clear. No pleural effusion or pneumothorax. Upper Abdomen: Interval development of multiple wedge-shaped regions of low attenuation in the superior aspect of the spleen as well as an approximately 6.6 x 5.2 cm low-attenuation region in the inferior aspect of the spleen. These findings are new compared to prior imaging. Musculoskeletal: No chest wall abnormality. No acute or significant osseous findings. Review of the MIP images confirms the above findings. IMPRESSION: 1. Stable mild aneurysmal dilatation of the aortic root which is no larger than 4.7 cm. I suspect the 4.7 cm measurement may be slightly exaggerated by cardiac motion artifact which is been present  across multiple prior studies. The true measurement of the aortic root is likely closer to 4.4 cm. Consider cardiac gated CTA of the next annual follow-up evaluation. 2. Interval development of multiple wedge-shaped regions of low attenuation in the superior aspect of the spleen as well as a new 6.6 cm low-attenuation region in the inferior aspect of the spleen. 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 There is no significant or irregular atherosclerotic plaque in the thoracic, or upper abdominal aorta to serve as a donor site for emboli. Does the patient have a clinical history of atrial fibrillation? Has the patient experienced episodes of left upper quadrant pain over the past several weeks or months? 3. Left ventricular dilatation. Aortic aneurysm NOS (ICD10-I71.9). Signed, HCriselda Peaches MD [facility] Electronically Signed By: HJacqulynn CadetM.D. On: 03/19/2017 10:07  I have independently reviewed the above radiology studies and reviewed the findings with the patient.  CATH: Procedures  RIGHT/LEFT HEART CATH AND CORONARY ANGIOGRAPHY  Conclusion  1. Widely patent coronary arteries (right dominant)  2. Normal right and left heart pressures  Coronary Findings  Diagnostic  Dominance: Right  Left Main  Vessel is angiographically normal.  Left Anterior Descending  Vessel is angiographically normal.  First Diagonal Branch  The LAD is patent to the LV apex without obstructive disease.  Left Circumflex  Large vessel, angiographically normal.  Second Obtuse Marginal Branch  Vessel is angiographically normal.  Right Coronary Artery  Vessel is angiographically normal. Large, dominant vessel, angiographically normal  Hemodynamic Data    Most Recent Value  Fick Cardiac Output 6.78 L/min  Fick Cardiac Output Index 3.32 (L/min)/BSA  RA A Wave 6 mmHg  RA V Wave 4 mmHg  RA Mean 5 mmHg  RV Systolic  Pressure 33 mmHg  RV Diastolic Pressure 3 mmHg  RV EDP 9 mmHg  PA Systolic Pressure 29 mmHg  PA Diastolic Pressure 12 mmHg  PA Mean 19 mmHg  PW A Wave 12 mmHg  PW V Wave 12 mmHg  PW Mean 9 mmHg  AO Systolic Pressure 567 mmHg  AO Diastolic Pressure 54 mmHg  AO Mean 80 mmHg  LV Systolic Pressure 014 mmHg  LV Diastolic Pressure 9 mmHg  LV EDP 15 mmHg  Arterial Occlusion Pressure Extended Systolic Pressure 103 mmHg  Arterial Occlusion Pressure Extended Diastolic Pressure 60 mmHg  Arterial Occlusion Pressure Extended Mean Pressure 82 mmHg  Left Ventricular Apex Extended Systolic Pressure 013 mmHg  Left Ventricular Apex Extended Diastolic Pressure 8 mmHg  Left Ventricular Apex Extended EDP Pressure 14 mmHg  QP/QS 0.96  TPVR Index 5.96 HRUI  TSVR Index 24.09 HRUI  PVR SVR Ratio 0.14  TPVR/TSVR Ratio   ECHO:  Result status: Edited Result - FINAL  [facility]  [address], [place] [address]  [phone]  -------------------------------------------------------------------  Echocardiography  (Report amended )  Patient: [name] Anderson, [name] Anderson  MR #: [id number]  Study Date: 03/19/2017  Gender: M  Age: 14  Height: 180.3 cm  Weight: 89.8 kg  BSA: 2.14 m^2  Pt. Status:  Room:  SONOGRAPHER Cindy Hazy, RDCS  ATTENDING Ena Dawley, M.D.  PERFORMING [facility], Outpatient  Joanna Hews [phone]  [place] Guardian [id number]  cc:  -------------------------------------------------------------------  LV EF: 50% - 55%  -------------------------------------------------------------------  Indications: I35.9 Aortic Valve Disorder.  -------------------------------------------------------------------  History: PMH: Acquired from the patient and from the patient&'s  chart. PMH: Obstructive Sleep Apnea-CPAP. Dilated Aortic Root.  Risk factors: Former tobacco use. Dyslipidemia.  -------------------------------------------------------------------  Study Conclusions  -  Left ventricle: The cavity size was severely dilated. Wall  thickness was normal. Systolic function was normal. The estimated  ejection fraction was in the range of 50% to 55%. Wall motion was  normal; there were no regional wall motion abnormalities. Doppler  parameters are consistent with abnormal left ventricular  relaxation (grade 1 diastolic dysfunction). There was no evidence  of elevated ventricular filling pressure by Doppler parameters.  - Aortic valve: There was severe regurgitation.  - Aortic root: The aortic root was moderately dilated measuring 46  mm.  - Ascending aorta: The ascending aorta was upper normal size  measuring 40 mm.  - Mitral valve: There was mild regurgitation.  - Left atrium: The atrium was mildly dilated.  - Right ventricle: The cavity size was normal. Wall thickness was  normal. Systolic function was normal.  - Right atrium: The atrium was normal in size.  - Inferior vena cava: The vessel was normal in size.  - Pericardium, extracardiac: There was no pericardial effusion.  Impressions:  - When compared to the prior study from 04/13/2016 there are  significant changes.  LV size has increased from normal to moderately to severely  dilated.  LVEF has decreased from 60-65% to 50-55%.  Aortic root remains moderately dilated with now severe aortic  regurgitation.  There is partially flail or  redundant chordae of the anterior  mitral valve leaflet with no significant mitral regurgitation.  CT surgery consult should be considered.  -------------------------------------------------------------------  Study data: Study status: Routine. Procedure: The patient  reported no pain pre or post test. Transthoracic echocardiography  for left ventricular function evaluation, for right ventricular  function evaluation, and for assessment of valvular function. Image  quality was adequate. Study completion: There were no  complications. Echocardiography. M-mode, complete  2D,  spectral Doppler, and color Doppler. Birthdate: Patient  birthdate: [birth year]. Age: Patient is 62 yr old. Sex: Gender:  male. BMI: 27.6 kg/m^2. Blood pressure: 125/64 Patient  status: Outpatient. Study date: Study date: 03/19/2017. Study  time: 08:42 AM. Location: [facility]  -------------------------------------------------------------------  -------------------------------------------------------------------  Left ventricle: The cavity size was severely dilated. Wall  thickness was normal. Systolic function was normal. The estimated  ejection fraction was in the range of 50% to 55%. Wall motion was  normal; there were no regional wall motion abnormalities. Doppler  parameters are consistent with abnormal left ventricular relaxation  (grade 1 diastolic dysfunction). There was no evidence of elevated  ventricular filling pressure by Doppler parameters.  -------------------------------------------------------------------  Aortic valve: Trileaflet; mildly thickened, mildly calcified  leaflets. Mobility was not restricted. Doppler: Transvalvular  velocity was within the normal range. There was no stenosis. There  was severe regurgitation. VTI ratio of LVOT to aortic valve:  0.73. Valve area (VTI): 4.47 cm^2. Indexed valve area (VTI): 2.09  cm^2/m^2. Peak velocity ratio of LVOT to aortic valve: 0.68. Valve  area (Vmax): 4.19 cm^2. Indexed valve area (Vmax): 1.96 cm^2/m^2.  Mean velocity ratio of LVOT to aortic valve: 0.66. Valve area  (Vmean): 4.05 cm^2. Indexed valve area (Vmean): 1.89 cm^2/m^2.  Mean gradient (S): 7 mm Hg. Peak gradient (S): 12 mm Hg.  -------------------------------------------------------------------  Aorta: Aortic root: The aortic root was moderately dilated  measuring 46 mm.  Ascending aorta: The ascending aorta was upper normal size  measuring 40 mm.  -------------------------------------------------------------------  Mitral valve: Structurally normal  valve. Mobility was not  restricted. Doppler: Transvalvular velocity was within the normal  range. There was no evidence for stenosis. There was mild  regurgitation.  -------------------------------------------------------------------  Left atrium: The atrium was mildly dilated.  -------------------------------------------------------------------  Right ventricle: The cavity size was normal. Wall thickness was  normal. Systolic function was normal.  -------------------------------------------------------------------  Pulmonic valve: Structurally normal valve. Cusp separation was  normal. Doppler: Transvalvular velocity was within the normal  range. There was no evidence for stenosis. There was trivial  regurgitation.  -------------------------------------------------------------------  Tricuspid valve: Structurally normal valve. Doppler:  Transvalvular velocity was within the normal range. There was no  regurgitation.  -------------------------------------------------------------------  Pulmonary artery: The main pulmonary artery was normal-sized.  Systolic pressure could not be accurately estimated.  -------------------------------------------------------------------  Right atrium: The atrium was normal in size.  -------------------------------------------------------------------  Pericardium: There was no pericardial effusion.  -------------------------------------------------------------------  Systemic veins:  Inferior vena cava: The vessel was normal in size.  -------------------------------------------------------------------  Measurements  Left ventricle Value Reference  LV ID, ED, PLAX chordal (H) 64 mm 43 - 52  LV ID, ES, PLAX chordal (H) 46.1 mm 23 - 38  LV fx shortening, PLAX chordal (L) 28 % >=29  LV PW thickness, ED 10 mm ---------  IVS/LV PW ratio, ED 0.9 <=1.3  Stroke volume, 2D 161 ml ---------  Stroke volume/bsa, 2D 75 ml/m^2 ---------  LV e&', lateral 5.59 cm/s  ---------  LV E/e&', lateral 10.41 ---------  LV e&',  medial 8.77 cm/s ---------  LV E/e&', medial 6.64 ---------  LV e&', average 7.18 cm/s ---------  LV E/e&', average 8.11 ---------  Ventricular septum Value Reference  IVS thickness, ED 9.01 mm ---------  LVOT Value Reference  LVOT ID, S 28 mm ---------  LVOT area 6.16 cm^2 ---------  LVOT ID 28 mm ---------  LVOT peak velocity, S 119 cm/s ---------  LVOT mean velocity, S 85.4 cm/s ---------  LVOT VTI, S 26.1 cm ---------  LVOT peak gradient, S 6 mm Hg ---------  Stroke volume (SV), LVOT DP 160.7 ml ---------  Stroke index (SV/bsa), LVOT DP 75.2 ml/m^2 ---------  Aortic valve Value Reference  Aortic valve peak velocity, S 175 cm/s ---------  Aortic valve mean velocity, S 130 cm/s ---------  Aortic valve VTI, S 36 cm ---------  Aortic mean gradient, S 7 mm Hg ---------  Aortic peak gradient, S 12 mm Hg ---------  VTI ratio, LVOT/AV 0.73 ---------  Aortic valve area, VTI 4.47 cm^2 ---------  Aortic valve area/bsa, VTI 2.09 cm^2/m^2 ---------  Velocity ratio, peak, LVOT/AV 0.68 ---------  Aortic valve area, peak velocity 4.19 cm^2 ---------  Aortic valve area/bsa, peak 1.96 cm^2/m^2 ---------  velocity  Velocity ratio, mean, LVOT/AV 0.66 ---------  Aortic valve area, mean velocity 4.05 cm^2 ---------  Aortic valve area/bsa, mean 1.89 cm^2/m^2 ---------  velocity  Aortic regurg pressure half-time 356 ms ---------  Aorta Value Reference  Aortic root ID, ED 46 mm ---------  Ascending aorta ID, A-P, S 40 mm ---------  Left atrium Value Reference  LA ID, A-P, ES 40 mm ---------  LA ID/bsa, A-P 1.87 cm/m^2 <=2.2  LA volume, S 60 ml ---------  LA volume/bsa, S 28.1 ml/m^2 ---------  LA volume, ES, 1-p A4C 54 ml ---------  LA volume/bsa, ES, 1-p A4C 25.3 ml/m^2 ---------  LA volume, ES, 1-p A2C 62 ml ---------  LA volume/bsa, ES, 1-p A2C 29 ml/m^2 ---------  Mitral valve Value Reference  Mitral E-wave peak velocity 58.2 cm/s  ---------  Mitral A-wave peak velocity 76 cm/s ---------  Mitral deceleration time (H) 271 ms 150 - 230  Mitral E/A ratio, peak 0.8 ---------  Right ventricle Value Reference  RV s&', lateral, S 20.8 cm/s ---------  Legend:  (L) and (H) mark values outside specified reference range.  -------------------------------------------------------------------  Lance Morin, M.D.  2018-11-20T17:55:38  TEE: *[facility]*  [address], [place] [address]  [phone]  -------------------------------------------------------------------  Transesophageal Echocardiography  Patient: [name] Anderson, [name] Anderson  MR #: [id number]  Study Date: 03/29/2017  Gender: M  Age: 71  Height: 180.3 cm  Weight: 88.2 kg  BSA: 2.12 m^2  Pt. Status:  Room:  ADMITTING Kirk Ruths  ATTENDING Kirk Ruths  ORDERING Kirk Ruths  PERFORMING Kirk Ruths  REFERRING Kirk Ruths  SONOGRAPHER Jannett Celestine, RDCS  cc:  -------------------------------------------------------------------  LV EF: 55% - 60%  -------------------------------------------------------------------  History: PMH: Aortic Valve Disease. Risk factors:  Dyslipidemia.  -------------------------------------------------------------------  Study Conclusions  - Left ventricle: Systolic function was normal. The estimated  ejection fraction was in the range of 55% to 60%. Wall motion was  normal; there were no regional wall motion abnormalities.  - Aortic valve: There was a small vegetation on the left  ventricular aspect of the left coronary cusp. There was severe  regurgitation.  - Mitral valve: No evidence of vegetation. There was mild  regurgitation.  - Right atrium: No evidence of thrombus in the atrial cavity  or  appendage.  - Tricuspid valve: No evidence of vegetation.  - Pulmonic valve: No evidence of vegetation.  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  -------------------------------------------------------------------  Study data: Study status: Routine. Consent: The risks,  benefits, and alternatives to the procedure were explained to the  patient and informed consent was obtained. Procedure: The patient  reported no pain pre or post test. Initial setup. The patient was  brought to the laboratory. Surface ECG leads were monitored.  Sedation. Sedation was administered by cardiology staff.  Transesophageal echocardiography. A transesophageal probe was  inserted by the attending cardiologist. Image quality was adequate.  Study completion: The patient tolerated the procedure well. There  were no complications. Diagnostic transesophageal  echocardiography. 2D and color Doppler. Birthdate: Patient  birthdate: 1956-04-19. Age: Patient is 62 yr old. Sex: Gender:  male. BMI: 27.1 kg/m^2. Blood pressure: 117/50 Patient  status: Outpatient. Study date: Study date: 03/29/2017. Study  time: 10:58 AM. Location: Endoscopy.  -------------------------------------------------------------------  -------------------------------------------------------------------  Left ventricle: Systolic function was normal. The estimated  ejection fraction was in the range of 55% to 60%. Wall motion was  normal; there were no regional wall motion abnormalities.  -------------------------------------------------------------------  Aortic valve: Trileaflet; mildly thickened leaflets. There was a  small vegetation on the left ventricular aspect of the left  coronary cusp. Doppler: There was severe regurgitation.  -------------------------------------------------------------------  Aorta: Descending aorta: The descending aorta had mild diffuse   disease.  -------------------------------------------------------------------  Mitral valve: Mildly thickened leaflets . Leaflet separation was  normal. No evidence of vegetation. Doppler: There was mild  regurgitation.  -------------------------------------------------------------------  Left atrium: The atrium was normal in size.  -------------------------------------------------------------------  Right ventricle: The cavity size was normal. Systolic function was  normal.  -------------------------------------------------------------------  Pulmonic valve: Structurally normal valve. Cusp separation was  normal. No evidence of vegetation.  -------------------------------------------------------------------  Tricuspid valve: Structurally normal valve. Leaflet separation  was normal. No evidence of vegetation. Doppler: There was mild  regurgitation.  -------------------------------------------------------------------  Right atrium: The atrium was normal in size. No evidence of  thrombus in the atrial cavity or appendage.  -------------------------------------------------------------------  Pericardium: There was no pericardial effusion.  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  - RESECTION MARGINS, NEGATIVE FOR MALIGNANCY.  - PLEASE SEE ONCOLOGY TEMPLATE FOR DETAILS.  Microscopic Comment  ONCOLOGY TABLE - TESTIS  1. Specimen and laterality: Right testis  2. Tumor focality: Unifocal  3. Macroscopic extent of tumor: Limited to testicular parenchyma.  4. Maximum tumor size (cm): 6.3 cm  5. Histologic type: Seminoma  6. Microscopic tumor extension: Limited to testis  7. Spermatic cord and surgical margins: Negative  8. Lymph-Vascular invasion: Present  9. Intratubular germ cell  neoplasia: Present  10. Lymph nodes: # examined: N/A; # positive: N/A  11. TNM code: pT2, pNX  12. Serum tumor markers: See patient's medical record  13. Comment: The cut surfaces of the tumor show a 6.3 cm well circumscribed tan soft mass.  Microscopically, sections show uniform malignant cells with prominent nucleoli, abundant cytoplasm with  mitotic activity and tumor necrosis. Angiolymphatic invasion is present. The tumor is limited to testis.  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 Immunohistochemical stains were performed and  the neoplastic cells show the following immunoprofile:  CD117 - negative  OCT4- positive  EMA- negative  CK AE1/AE3- negative  PLAP - positive  CD30 - virtually negative  CD20 - negative  AFP - negative  1 of 2  FINAL for [name] Anderson, [name] Anderson ([id number])  Microscopic Comment(continued)  HCG- negative  The control stained appropriately. The overall morphologic and immunohistochemical finding are diagnostic  for seminoma. The final resection margin is negative. (HCL:kh 09-15-13)  H. CATHERINE LI MD  Aortic Size Index= 4.7 /Body surface area is 2.16 meters squared. = 2.2  < 2.75 cm/m2 4% risk per year  2.75 to 4.25 8% risk per year  > 4.25 cm/m2 20% risk per year  Assessment / Plan:  1/Significant aortic insufficiency without overt symptoms of congestive heart failure but with evidence of left ventricular dilatation and relatively well preserved LV function. 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 The patient will complete 6 weeks of IV antibiotics January 16. I plan to see him back on January 24. We discussed proceeding with replacement of aortic valve and a sending aorta February 4. When he is seen on January 24 we will plan repeat blood cultures to ensure negativity before proceeding with valve replacement February 4. Risks and options of surgery were discussed with the patient and his wife in detail. We also discussed the pros and cons of mechanical versus tissue valves. The patient prefers a tissue valve to avoid long-term Coumadin.  The goals risks and alternatives of the planned surgical procedure have been discussed with the patient in detail. The risks of the procedure including death, infection, heart block and need for permanent pacemaker stroke, myocardial infarction, bleeding, blood transfusion have all been discussed specifically. [name] Anderson is willing to proceed with the planned procedure.  [name] MD   [address]  [place] [address]  Office [phone]  [place]  05/02/2017 1:38 PM

## 2017-05-02 NOTE — Consult Note (Signed)
[address]       [place] [address]             [phone]        Jaimes R Shelden Gleneagle Medical Record #[id number] Date of Birth: [birth year]  Referring: Dr Stanford Breed  Primary Care: Antony Contras, MD Primary Cardiologist: No primary care provider on file.  Chief Complaint:   Routine follow-up for aortic insufficiency, noted to have significantly more aortic insufficiency, likely endocarditis   History of Present Illness:     Patient is a 62 year old male with known previous cardiac history involvingaortic insufficiency and mildly dilated ascending aorta.  The patient has been followed for several years with serial echocardiograms and CTA of the chest.  About 5 weeks ago and echocardiogram was done  echo suggested more aortic insufficiency.  Repeat CTA of the chest suggested possible splenic infarcts.  TEE was performed because of these findings and suggested possible endocarditis, with increased aortic insufficiency and possible vegetation.  Although the patient did not present because of symptoms, in retrospect he had  felt more fatigued over the past several months, but denies any specific infections, denies fever and chills, has had no dental recent dental work, he has been noted to be anemic and recent upper GI endoscopy and colonoscopy were performed without specific causes of the anemia.   Following the TEE the patient was admitted to 3 blood cultures were obtained and grew Other than fatigue the patient has no significant symptoms of congestive heart failure, he was with a MRI of the brain in early November for 42-monthhistory of dizziness.  MRI was negative for stroke March 12, 2017.  Patient will complete 6 weeks of IV ceFAZolin (ANCEF) IV on January 16.  He is to have dental work done on January 7.  Since discharge from the hospital patient has had a cardiac catheterization that showed no significant coronary artery disease.  He notes that overall he  feels better denies fever chills, shortness of breath is improved.  Current Activity/ Functional Status: Patient is independent with mobility/ambulation, transfers, ADL's, IADL's.   Zubrod Score: At the time of surgery this patient's most appropriate activity status/level should be described as: _0     0    Normal activity, no symptoms _1     1    Restricted in physical strenuous activity but ambulatory, able to do out light work _2     2    Ambulatory and capable of self care, unable to do work activities, up and about                 more than 50%  Of the time                            _3     3    Only limited self care, in bed greater than 50% of waking hours _4     4    Completely disabled, no self care, confined to bed or chair _5     5    Moribund  Past Medical History:  Diagnosis Date  . Allergy   . Anemia   . Barrett's esophagus - short segment 03/13/2017  . Cancer [place] 09/11/13   testicular  . ED (erectile dysfunction)   . Endocarditis    /[facility]11/30/2018  . H/O aneurysm 03/05/2017  . High cholesterol   . History of basal cell carcinoma excision  02/2002   --  NOSE  &   2013  RIGHT EAR  . History of kidney stones   . Hyperlipidemia   . Mild obstructive sleep apnea    PER STUDY 01-18-2005--  NO CPAP OR MOUTH GUARD  . Moderate aortic valve insufficiency   . 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 Personal history of colonic polyps    TUBULAR ADENOMA--   . Seasonal allergies   . 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[place])   . Testicular mass    RIGHT    Past Surgical History:  Procedure Laterality Date  . COLONOSCOPY  01/24/06, 07/10/11  . EXTRACORPOREAL SHOCK WAVE LITHOTRIPSY    . HERNIA REPAIR    . MOHS SURGERY  02/2002  &  2013   TIP OF NOSE-///     RIGHT EAR  . ORCHIECTOMY Right 09/11/2013   Procedure:  RIGHT ORCHIECTOMY;  Surgeon: Jorja Loa, MD;  Location: [facility];  Service: Urology;  Laterality: Right;  . RIGHT/LEFT HEART  CATH AND CORONARY ANGIOGRAPHY N/A 04/18/2017   Procedure: RIGHT/LEFT HEART CATH AND CORONARY ANGIOGRAPHY;  Surgeon: Sherren Mocha, MD;  Location: [facility];  Service: Cardiovascular;  Laterality: N/A;  . SCROTAL EXPLORATION Right 09/11/2013   Procedure: RIGHT INGUINAL EXPLORATION;  Surgeon: Jorja Loa, MD;  Location: [facility];  Service: Urology;  Laterality: Right;  . TEE WITHOUT CARDIOVERSION N/A 03/29/2017   Procedure: TRANSESOPHAGEAL ECHOCARDIOGRAM (TEE);  Surgeon: Lelon Perla, MD;  Location: [facility] ENDOSCOPY;  Service: Cardiovascular;  Laterality: N/A;  . TRANSTHORACIC ECHOCARDIOGRAM  02-25-2013   DR CRENSHAW   NORMAL LVSF/  EF 55-60%/   GRADE 2 DIASTOLIC DYSFUNCTION/  ECCENTRIC MODERATE AORTIC INSUFFICIENCY WITHOUT STENOSIS/  MILD DILATED AORTIC ROOT/ TRIVIAL MR, TR,  & PR  . UMBILICAL HERNIA REPAIR  06-08-2009  . URETEROSCOPIC LASER LITHOTRIPSY STONE EXTRACTION  2007    Social History   Tobacco Use  Smoking Status Current Some Day Smoker  . Types: Pipe  Smokeless Tobacco Former Systems developer  . Types: Chew  . Quit date: 04/26/2011  Tobacco Comment   OCCASIONAL  PIPE SMOKER  (NEVER SMOKED CIGARETTES)    Social History   Substance and Sexual Activity  Alcohol Use Yes   Comment: 03/29/2017 "might have a glass of wine twice/year"    Social History   Socioeconomic History  . Marital status: Married    Spouse name: Coralyn Mark  . Number of children: 2  . Years of education: 4  . Highest education level: Not on file  Social Needs  . Financial resource strain: Not on file  . Food insecurity - worry: Not on file  . Food insecurity - inability: Not on file  . Transportation needs - medical: Not on file  . Transportation needs - non-medical: Not on file  Occupational History  . 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 Smoking status: Current Some Day Smoker    Types: Pipe  . Smokeless tobacco: Former Systems developer    Types: [place] date:  04/26/2011  . Tobacco comment: OCCASIONAL  PIPE SMOKER  (NEVER SMOKED CIGARETTES)  Substance and Sexual Activity  . Alcohol use: Yes    Comment: 03/29/2017 "might have a glass of wine twice/year"  . Drug use: No  . Sexual activity: Yes  Other Topics Concern  . Not on file  Social History Narrative   Lives with wife Coralyn Mark   Caffeine use: coffee-  1 cup per day    No Known Allergies  Current Outpatient Medications  Medication Sig Dispense Refill  . acetaminophen (TYLENOL) 500 MG tablet Take 1,000 mg by mouth every 6 (six) hours as needed (for pain.).     Marland Kitchen aluminum chloride (HYPERCARE) 20 % external solution Apply 1 application topically 3 (three) times daily as needed (for sweating.).    Marland Kitchen amoxicillin (AMOXIL) 500 MG capsule TAKE 4 TABLETS BY MOUTH 1 HOUR PRIOR TO DENTAL APPOINTMENT 4 capsule 1  . Ascorbic Acid (VITAMIN C) 1000 MG tablet Take 1,000 mg by mouth daily.    Marland Kitchen aspirin 81 MG chewable tablet Chew 81 mg by mouth daily.    Marland Kitchen atorvastatin (LIPITOR) 10 MG tablet Take 1 tablet (10 mg total) by mouth daily at 6 PM. 30 tablet 6  . ceFAZolin (ANCEF) IVPB Inject 2 g into the vein every 8 (eight) hours. Indication:  CONS endocarditis Last Day of Therapy:  05/15/17 Labs - Once weekly:  CBC/D and BMP, Labs - Every other week:  ESR and CRP 126 Units 0  . CVS TRIPLE MAGNESIUM COMPLEX PO Take 1 tablet by mouth 2 (two) times daily.    Marland Kitchen ECHINACEA PO Take 760 mg by mouth daily.    . Ferrous Sulfate (IRON) 325 (65 Fe) MG TABS Take 325 mg by mouth daily.     . Glucosamine-Chondroitin (COSAMIN DS PO) Take 1 tablet by mouth 2 (two) times daily.    Marland Kitchen losartan (COZAAR) 50 MG tablet Take 1 tablet (50 mg total) by mouth daily. 90 tablet 3  . Naphazoline-Pheniramine (OPCON-A) 0.027-0.315 % SOLN Place 1-2 drops into both eyes 3 (three) times daily as needed (for allergy eyes.).    Marland Kitchen NON FORMULARY Take 2 capsules by mouth 2 (two) times daily. DoTerra (Microplex VMz) 2 Capsule twice daily    . NON  FORMULARY Take 2 capsules by mouth 2 (two) times daily. XE0 Mega "Doterra"    . sildenafil (REVATIO) 20 MG tablet Take 20 mg by mouth daily as needed (for ED).     . SUPER B COMPLEX/C PO Take 1 tablet by mouth daily.    Marland Kitchen triamcinolone cream (KENALOG) 0.1 % Apply 1 application topically daily as needed (FOR DRY SKIN/ITCHY SKIN.). Apply to area after bath (DO NOT APPLY TO FACE)  1   No current facility-administered medications for this visit.      (Not in a hospital admission)  Family History  Problem Relation Age of Onset  . Colon cancer Father   . Heart disease Paternal Grandmother      Review of Systems:  Pertinent items are noted in HPI.  Review of Systems  Constitutional: Negative for chills, diaphoresis, fever, malaise/fatigue and weight loss.  HENT: Negative.   Eyes: Negative.   Respiratory: Positive for shortness of breath. Negative for cough, hemoptysis, sputum production and wheezing.   Cardiovascular: Negative for chest pain, palpitations, orthopnea, claudication, leg swelling and PND.  Gastrointestinal: Negative for abdominal pain, blood in stool, constipation, diarrhea, heartburn, melena, nausea and vomiting.  Genitourinary: Negative.   Musculoskeletal: Negative.   Skin: Negative for itching and rash.  Neurological: Negative.  Negative for weakness.  Endo/Heme/Allergies: Negative.   Psychiatric/Behavioral: Negative.        Immunizations: Flu [n]; Pneumococcal[ n ];   Physical Exam: BP (!) 147/66   Pulse 75   Resp 20   Ht _0  (1.803 m)   Wt 206 lb (93.4 kg)   SpO2 98% Comment: RA  BMI 28.73 kg/m   General appearance: alert, cooperative, appears stated age and no distress Head: Normocephalic, without obvious abnormality, atraumatic Neck: no adenopathy, no carotid bruit, no JVD, supple, symmetrical, trachea midline and thyroid not enlarged, symmetric, no tenderness/mass/nodules Lymph nodes: Cervical, supraclavicular, and axillary nodes normal. Resp:  clear to auscultation bilaterally Back: symmetric, no curvature. ROM normal. No CVA tenderness. Cardio: regular rate and rhythm, S1, S2 normal, no murmur, click, rub or gallop GI: soft, non-tender; bowel sounds normal; no masses,  no organomegaly Extremities: extremities normal, atraumatic, no cyanosis or edema Neurologic: Grossly normal Palpable DP and PT pulses  Diagnostic Studies & Laboratory data:    Recent Lab Findings: Lab Results  Component Value Date   WBC 6.9 04/18/2017   HGB 12.8 (L) 04/18/2017   HCT 40.3 04/18/2017   PLT 211 04/18/2017   GLUCOSE 101 (H) 04/18/2017   CHOL 165 03/30/2017   TRIG 91 03/30/2017   HDL 24 (L) 03/30/2017   LDLDIRECT 160.9 02/12/2013   LDLCALC 123 (H) 03/30/2017   ALT 13 (L) 03/29/2017   AST 19 03/29/2017   NA 138 04/18/2017   K 4.3 04/18/2017   CL 105 04/18/2017   CREATININE 1.03 04/18/2017   BUN 18 04/18/2017   CO2 25 04/18/2017   TSH 3.911 03/29/2017   INR 1.00 04/18/2017  Micro:  Results for orders placed or performed during the hospital encounter of 03/29/17  Culture, blood (routine x 2)     Status: Abnormal   Collection Time: 03/29/17 12:30 PM  Result Value Ref Range Status   Specimen Description BLOOD RIGHT ANTECUBITAL  Final   Special Requests   Final    BOTTLES DRAWN AEROBIC ONLY Blood Culture adequate volume   Culture  Setup Time   Final    GRAM POSITIVE COCCI IN CLUSTERS AEROBIC BOTTLE ONLY CRITICAL VALUE NOTED.  VALUE IS CONSISTENT WITH PREVIOUSLY REPORTED AND CALLED VALUE.    Culture (A)  Final    STAPHYLOCOCCUS SPECIES (COAGULASE NEGATIVE) SUSCEPTIBILITIES PERFORMED ON PREVIOUS CULTURE WITHIN THE LAST 5 DAYS.    Report Status 04/02/2017 FINAL  Final  Culture, blood (routine x 2)     Status: Abnormal   Collection Time: 03/29/17 12:32 PM  Result Value Ref Range Status   Specimen Description BLOOD LEFT HAND  Final   Special Requests   Final    BOTTLES DRAWN AEROBIC ONLY Blood Culture adequate volume   Culture   Setup Time   Final    GRAM POSITIVE COCCI IN CLUSTERS AEROBIC BOTTLE ONLY CRITICAL RESULT CALLED TO, READ BACK BY AND VERIFIED WITH: A. JOHNSTON,PHARMD 2056 03/30/2017 T. TYSOR    Culture STAPHYLOCOCCUS SPECIES (COAGULASE NEGATIVE) (A)  Final   Report Status 04/02/2017 FINAL  Final   Organism ID, Bacteria STAPHYLOCOCCUS SPECIES (COAGULASE NEGATIVE)  Final      Susceptibility   Staphylococcus species (coagulase negative) - MIC*    CIPROFLOXACIN <=0.5 SENSITIVE Sensitive     ERYTHROMYCIN >=8 RESISTANT Resistant     GENTAMICIN <=0.5 SENSITIVE Sensitive     OXACILLIN <=0.25 SENSITIVE Sensitive     TETRACYCLINE <=1 SENSITIVE Sensitive     VANCOMYCIN 1 SENSITIVE Sensitive     TRIMETH/SULFA <=10 SENSITIVE Sensitive     CLINDAMYCIN <=0.25 SENSITIVE Sensitive     RIFAMPIN <=0.5 SENSITIVE Sensitive     Inducible Clindamycin NEGATIVE Sensitive     * STAPHYLOCOCCUS SPECIES (COAGULASE NEGATIVE)  Blood Culture ID Panel (Reflexed)     Status: Abnormal   Collection Time: 03/29/17 12:32 PM  Result Value Ref Range Status   Enterococcus species NOT DETECTED NOT DETECTED Final   Listeria monocytogenes NOT DETECTED NOT DETECTED Final   Staphylococcus species DETECTED (A) NOT DETECTED Final    Comment: Methicillin (oxacillin) susceptible coagulase negative staphylococcus. Possible blood culture contaminant (unless isolated from more than one blood culture draw or clinical case suggests pathogenicity). No antibiotic treatment is indicated for blood  culture contaminants. CRITICAL RESULT CALLED TO, READ BACK BY AND VERIFIED WITH: A. JOHNSTON,PHARMD 2056 03/30/2017 T. TYSOR    Staphylococcus aureus NOT DETECTED NOT DETECTED Final   Methicillin resistance NOT DETECTED NOT DETECTED Final   Streptococcus species NOT DETECTED NOT DETECTED Final   Streptococcus agalactiae NOT DETECTED NOT DETECTED Final   Streptococcus pneumoniae NOT DETECTED NOT DETECTED Final   Streptococcus pyogenes NOT DETECTED NOT  DETECTED Final   Acinetobacter baumannii NOT DETECTED NOT DETECTED Final   Enterobacteriaceae species NOT DETECTED NOT DETECTED Final   Enterobacter cloacae complex NOT DETECTED NOT DETECTED Final   Escherichia coli NOT DETECTED NOT DETECTED Final   Klebsiella oxytoca NOT DETECTED NOT DETECTED Final   Klebsiella pneumoniae NOT DETECTED NOT DETECTED Final   Proteus species NOT DETECTED NOT DETECTED Final   Serratia marcescens NOT DETECTED NOT DETECTED Final   Haemophilus influenzae NOT DETECTED NOT DETECTED Final   Neisseria meningitidis NOT DETECTED NOT DETECTED Final   Pseudomonas aeruginosa NOT DETECTED NOT DETECTED Final   Candida albicans NOT DETECTED NOT DETECTED Final   Candida glabrata NOT DETECTED NOT DETECTED Final   Candida krusei NOT DETECTED NOT DETECTED Final   Candida parapsilosis NOT DETECTED NOT DETECTED Final   Candida tropicalis NOT DETECTED NOT DETECTED Final  Culture, blood (routine x 2)     Status: Abnormal   Collection Time: 03/29/17 12:35 PM  Result Value Ref Range Status   Specimen Description BLOOD RIGHT HAND  Final   Special Requests   Final    BOTTLES DRAWN AEROBIC ONLY Blood Culture adequate volume   Culture  Setup Time   Final    GRAM POSITIVE COCCI IN CLUSTERS AEROBIC BOTTLE ONLY CRITICAL VALUE NOTED.  VALUE IS CONSISTENT WITH PREVIOUSLY REPORTED AND CALLED VALUE.    Culture (A)  Final    STAPHYLOCOCCUS SPECIES (COAGULASE NEGATIVE) SUSCEPTIBILITIES PERFORMED ON PREVIOUS CULTURE WITHIN THE LAST 5 DAYS.    Report Status 04/02/2017 FINAL  Final  Culture, blood (routine x 2)     Status: Abnormal   Collection Time: 03/29/17  9:17 PM  Result Value Ref Range Status   Specimen Description BLOOD LEFT ANTECUBITAL  Final   Special Requests Blood Culture adequate volume IN PEDIATRIC BOTTLE  Final   Culture  Setup Time   Final    GRAM POSITIVE COCCI IN PEDIATRIC BOTTLE CRITICAL VALUE NOTED.  VALUE IS CONSISTENT WITH PREVIOUSLY REPORTED AND CALLED VALUE.     Culture (A)  Final    STAPHYLOCOCCUS SPECIES (COAGULASE NEGATIVE) SUSCEPTIBILITIES PERFORMED ON PREVIOUS CULTURE WITHIN THE LAST 5 DAYS.    Report Status 04/02/2017 FINAL  Final  Culture, blood (routine x 2)     Status: Abnormal   Collection Time: 03/29/17  9:21 PM  Result Value Ref Range Status   Specimen Description BLOOD LEFT HAND  Final   Special Requests Blood Culture adequate volume IN PEDIATRIC BOTTLE  Final   Culture  Setup Time   Final    GRAM POSITIVE COCCI IN CLUSTERS IN PEDIATRIC BOTTLE CRITICAL VALUE NOTED.  VALUE IS CONSISTENT WITH PREVIOUSLY REPORTED AND CALLED VALUE.  Culture (A)  Final    STAPHYLOCOCCUS SPECIES (COAGULASE NEGATIVE) SUSCEPTIBILITIES PERFORMED ON PREVIOUS CULTURE WITHIN THE LAST 5 DAYS.    Report Status 04/02/2017 FINAL  Final  Culture, blood (routine x 2)     Status: None   Collection Time: 04/01/17  5:30 AM  Result Value Ref Range Status   Specimen Description BLOOD RIGHT ANTECUBITAL  Final   Special Requests   Final    BOTTLES DRAWN AEROBIC AND ANAEROBIC Blood Culture adequate volume   Culture NO GROWTH 5 DAYS  Final   Report Status 04/06/2017 FINAL  Final  Culture, blood (routine x 2)     Status: None   Collection Time: 04/01/17  5:35 AM  Result Value Ref Range Status   Specimen Description BLOOD LEFT ANTECUBITAL  Final   Special Requests   Final    BOTTLES DRAWN AEROBIC AND ANAEROBIC Blood Culture adequate volume   Culture NO GROWTH 5 DAYS  Final   Report Status 04/06/2017 FINAL  Final     Recent Radiology Findings:   Mr Virgel Paling CW Contrast  Result Date: 03/12/2017 CLINICAL DATA:  Dizziness for 3 months.  Hypertension. EXAM: MRA HEAD WITHOUT CONTRAST TECHNIQUE: Angiographic images of the Circle of Willis were obtained using MRA technique without intravenous contrast. COMPARISON:  None. FINDINGS: Anterior circulation: Slightly smaller right ICA in the setting of relative right A1 hypoplasia. Robust anterior communicating artery.  Vessels are smooth and widely patent. Negative for aneurysm. Posterior circulation: Left dominant vertebral artery. There is symmetric loss of signal in the proximal V4 segments and picas due to partial horizontal course. There is antegrade flow in both vertebral arteries on recent carotid Doppler. The vessels are smooth and widely patent. A small left posterior communicating artery is present. Negative for aneurysm. IMPRESSION: Normal intracranial MRA. Electronically Signed   By: Monte Fantasia M.D.   On: 03/12/2017 09:00   Ct Angio Chest Aorta W &/or Wo Contrast  Result Date: 03/19/2017 CLINICAL DATA:  62 year old male with a history of thoracic aortic aneurysm EXAM: CT ANGIOGRAPHY CHEST WITH CONTRAST TECHNIQUE: Multidetector CT imaging of the chest was performed using the standard protocol during bolus administration of intravenous contrast. Multiplanar CT image reconstructions and MIPs were obtained to evaluate the vascular anatomy. CONTRAST:  116m ISOVUE-370 IOPAMIDOL (ISOVUE-370) INJECTION 76% COMPARISON:  Prior CTA of the chest 03/29/2016 FINDINGS: Cardiovascular: Stable mild aneurysmal dilatation of the aortic root. Precise measurements are challenging giving artifact related to cardiac motion, however the maximal diameter does not exceed 4.7 cm as previously measured. In fact, I suspect the true measurement to be closer to 4.4 cm. The tubular portion of the ascending thoracic aorta remains stable at 3.8 cm. The transverse and descending thoracic aorta are normal in caliber. No significant aortic plaque. Mild left ventricular dilatation. No thrombus visualized within the cardiac chambers or left atrial appendage. Mediastinum/Nodes: Unremarkable CT appearance of the thyroid gland. No suspicious mediastinal or hilar adenopathy. No soft tissue mediastinal mass. The thoracic esophagus is unremarkable. Lungs/Pleura: Lungs are clear. No pleural effusion or pneumothorax. Upper Abdomen: Interval development  of multiple wedge-shaped regions of low attenuation in the superior aspect of the spleen as well as an approximately 6.6 x 5.2 cm low-attenuation region in the inferior aspect of the spleen. These findings are new compared to prior imaging. Musculoskeletal: No chest wall abnormality. No acute or significant osseous findings. Review of the MIP images confirms the above findings. IMPRESSION: 1. Stable mild aneurysmal dilatation of the aortic root which  is no larger than 4.7 cm. I suspect the 4.7 cm measurement may be slightly exaggerated by cardiac motion artifact which is been present across multiple prior studies. The true measurement of the aortic root is likely closer to 4.4 cm. Consider cardiac gated CTA of the next annual follow-up evaluation. 2. Interval development of multiple wedge-shaped regions of low attenuation in the superior aspect of the spleen as well as a new 6.6 cm low-attenuation region in the inferior aspect of the spleen. 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 There is no significant or irregular atherosclerotic plaque in the thoracic, or upper abdominal aorta to serve as a donor site for emboli. Does the patient have a clinical history of atrial fibrillation? Has the patient experienced episodes of left upper quadrant pain over the past several weeks or months? 3. Left ventricular dilatation. Aortic aneurysm NOS (ICD10-I71.9). Signed, Criselda Peaches, MD [facility] Radiology Electronically Signed   By: Jacqulynn Cadet M.D.   On: 03/19/2017 10:07   I have independently reviewed the above radiology studies  and reviewed the findings with the patient.  CATH: Procedures   RIGHT/LEFT HEART CATH AND CORONARY ANGIOGRAPHY  Conclusion   1. Widely patent coronary arteries (right dominant) 2. Normal right and left heart pressures   Coronary Findings   Diagnostic  Dominance: Right  Left Main  Vessel is  angiographically normal.  Left Anterior Descending  Vessel is angiographically normal.  First Diagonal Branch  The LAD is patent to the LV apex without obstructive disease.  Left Circumflex  Large vessel, angiographically normal.  Second Obtuse Marginal Branch  Vessel is angiographically normal.  Right Coronary Artery  Vessel is angiographically normal. Large, dominant vessel, angiographically normal  Hemodynamic Data    Most Recent Value  Fick Cardiac Output 6.78 L/min  Fick Cardiac Output Index 3.32 (L/min)/BSA  RA A Wave 6 mmHg  RA V Wave 4 mmHg  RA Mean 5 mmHg  RV Systolic Pressure 33 mmHg  RV Diastolic Pressure 3 mmHg  RV EDP 9 mmHg  PA Systolic Pressure 29 mmHg  PA Diastolic Pressure 12 mmHg  PA Mean 19 mmHg  PW A Wave 12 mmHg  PW V Wave 12 mmHg  PW Mean 9 mmHg  AO Systolic Pressure 549 mmHg  AO Diastolic Pressure 54 mmHg  AO Mean 80 mmHg  LV Systolic Pressure 826 mmHg  LV Diastolic Pressure 9 mmHg  LV EDP 15 mmHg  Arterial Occlusion Pressure Extended Systolic Pressure 415 mmHg  Arterial Occlusion Pressure Extended Diastolic Pressure 60 mmHg  Arterial Occlusion Pressure Extended Mean Pressure 82 mmHg  Left Ventricular Apex Extended Systolic Pressure 830 mmHg  Left Ventricular Apex Extended Diastolic Pressure 8 mmHg  Left Ventricular Apex Extended EDP Pressure 14 mmHg  QP/QS 0.96  TPVR Index 5.96 HRUI  TSVR Index 24.09 HRUI  PVR SVR Ratio 0.14  TPVR/TSVR Ratio          ECHO: Result status: Edited Result - FINAL                           [facility]                        [address]                        [place], [place] [address]  [phone]  ------------------------------------------------------------------- Echocardiography  (Report amended )  Patient:    Rashi, Giuliani MR #:       [id number] Study Date: 03/19/2017 Gender:     M Age:        62 Height:     180.3 cm Weight:     89.8 kg BSA:         2.14 m^2 Pt. Status: Room:   SONOGRAPHER  Cindy Hazy, RDCS  ATTENDING    Ena Dawley, M.D.  PERFORMING   [facility], Outpatient  Joanna Hews [phone]  [place] [facility] [phone]  cc:  ------------------------------------------------------------------- LV EF: 50% -   55%  ------------------------------------------------------------------- Indications:      I35.9 Aortic Valve Disorder.  ------------------------------------------------------------------- History:   PMH:  Acquired from the patient and from the patient&'s chart.  PMH:  Obstructive Sleep Apnea-CPAP. Dilated Aortic Root. Risk factors:  Former tobacco use. Dyslipidemia.  ------------------------------------------------------------------- Study Conclusions  - Left ventricle: The cavity size was severely dilated. Wall   thickness was normal. Systolic function was normal. The estimated   ejection fraction was in the range of 50% to 55%. Wall motion was   normal; there were no regional wall motion abnormalities. Doppler   parameters are consistent with abnormal left ventricular   relaxation (grade 1 diastolic dysfunction). There was no evidence   of elevated ventricular filling pressure by Doppler parameters. - Aortic valve: There was severe regurgitation. - Aortic root: The aortic root was moderately dilated measuring 46   mm. - Ascending aorta: The ascending aorta was upper normal size   measuring 40 mm. - Mitral valve: There was mild regurgitation. - Left atrium: The atrium was mildly dilated. - Right ventricle: The cavity size was normal. Wall thickness was   normal. Systolic function was normal. - Right atrium: The atrium was normal in size. - Inferior vena cava: The vessel was normal in size. - Pericardium, extracardiac: There was no pericardial effusion.  Impressions:  - When compared to the prior study from 04/13/2016 there are   significant changes.   LV size has increased  from normal to moderately to severely   dilated.   LVEF has decreased from 60-65% to 50-55%.   Aortic root remains moderately dilated with now severe aortic   regurgitation.   There is partially flail or redundant chordae of the anterior   mitral valve leaflet with no significant mitral regurgitation.     CT surgery consult should be considered.  ------------------------------------------------------------------- Study data:   Study status:  Routine.  Procedure:  The patient reported no pain pre or post test. Transthoracic echocardiography for left ventricular function evaluation, for right ventricular function evaluation, and for assessment of valvular function. Image quality was adequate.  Study completion:  There were no complications.          Echocardiography.  M-mode, complete 2D, spectral Doppler, and color Doppler.  Birthdate:  Patient birthdate: 07/09/1955.  Age:  Patient is 62 yr old.  Sex:  Gender: male.    BMI: 27.6 kg/m^2.  Blood pressure:     125/64  Patient status:  Outpatient.  Study date:  Study date: 03/19/2017. Study time: 08:42 AM.  Location:  [facility]  -------------------------------------------------------------------  ------------------------------------------------------------------- Left ventricle:  The cavity size was severely dilated. Wall thickness was normal. Systolic function was normal. The estimated ejection fraction was in the range of 50% to 55%. Wall motion was normal; there were no regional wall  motion abnormalities. Doppler parameters are consistent with abnormal left ventricular relaxation (grade 1 diastolic dysfunction). There was no evidence of elevated ventricular filling pressure by Doppler parameters.  ------------------------------------------------------------------- Aortic valve:   Trileaflet; mildly thickened, mildly calcified leaflets. Mobility was not restricted.  Doppler:  Transvalvular velocity was within the normal  range. There was no stenosis. There was severe regurgitation.    VTI ratio of LVOT to aortic valve: 0.73. Valve area (VTI): 4.47 cm^2. Indexed valve area (VTI): 2.09 cm^2/m^2. Peak velocity ratio of LVOT to aortic valve: 0.68. Valve area (Vmax): 4.19 cm^2. Indexed valve area (Vmax): 1.96 cm^2/m^2. Mean velocity ratio of LVOT to aortic valve: 0.66. Valve area (Vmean): 4.05 cm^2. Indexed valve area (Vmean): 1.89 cm^2/m^2. Mean gradient (S): 7 mm Hg. Peak gradient (S): 12 mm Hg.  ------------------------------------------------------------------- Aorta:  Aortic root: The aortic root was moderately dilated measuring 46 mm. Ascending aorta: The ascending aorta was upper normal size measuring 40 mm.  ------------------------------------------------------------------- Mitral valve:   Structurally normal valve.   Mobility was not restricted.  Doppler:  Transvalvular velocity was within the normal range. There was no evidence for stenosis. There was mild regurgitation.  ------------------------------------------------------------------- Left atrium:  The atrium was mildly dilated.  ------------------------------------------------------------------- Right ventricle:  The cavity size was normal. Wall thickness was normal. Systolic function was normal.  ------------------------------------------------------------------- Pulmonic valve:    Structurally normal valve.   Cusp separation was normal.  Doppler:  Transvalvular velocity was within the normal range. There was no evidence for stenosis. There was trivial regurgitation.  ------------------------------------------------------------------- Tricuspid valve:   Structurally normal valve.    Doppler: Transvalvular velocity was within the normal range. There was no regurgitation.  ------------------------------------------------------------------- Pulmonary artery:   The main pulmonary artery was normal-sized. Systolic pressure could  not be accurately estimated.  ------------------------------------------------------------------- Right atrium:  The atrium was normal in size.  ------------------------------------------------------------------- Pericardium:  There was no pericardial effusion.  ------------------------------------------------------------------- Systemic veins: Inferior vena cava: The vessel was normal in size.  ------------------------------------------------------------------- Measurements   Left ventricle                            Value          Reference  LV ID, ED, PLAX chordal           (H)     64    mm       43 - 52  LV ID, ES, PLAX chordal           (H)     46.1  mm       23 - 38  LV fx shortening, PLAX chordal    (L)     28    %        >=29  LV PW thickness, ED                       10    mm       ---------  IVS/LV PW ratio, ED                       0.9            <=1.3  Stroke volume, 2D                         161   ml       ---------  Stroke volume/bsa,  2D                     75    ml/m^2   ---------  LV e&', lateral                            5.59  cm/s     ---------  LV E/e&', lateral                          10.41          ---------  LV e&', medial                             8.77  cm/s     ---------  LV E/e&', medial                           6.64           ---------  LV e&', average                            7.18  cm/s     ---------  LV E/e&', average                          8.11           ---------    Ventricular septum                        Value          Reference  IVS thickness, ED                         9.01  mm       ---------    LVOT                                      Value          Reference  LVOT ID, S                                28    mm       ---------  LVOT area                                 6.16  cm^2     ---------  LVOT ID                                   28    mm       ---------  LVOT peak velocity, S                     119   cm/s     ---------   LVOT mean velocity, S                     85.4  cm/s     ---------  LVOT VTI, S                               26.1  cm       ---------  LVOT peak gradient, S                     6     mm Hg    ---------  Stroke volume (SV), LVOT DP               160.7 ml       ---------  Stroke index (SV/bsa), LVOT DP            75.2  ml/m^2   ---------    Aortic valve                              Value          Reference  Aortic valve peak velocity, S             175   cm/s     ---------  Aortic valve mean velocity, S             130   cm/s     ---------  Aortic valve VTI, S                       36    cm       ---------  Aortic mean gradient, S                   7     mm Hg    ---------  Aortic peak gradient, S                   12    mm Hg    ---------  VTI ratio, LVOT/AV                        0.73           ---------  Aortic valve area, VTI                    4.47  cm^2     ---------  Aortic valve area/bsa, VTI                2.09  cm^2/m^2 ---------  Velocity ratio, peak, LVOT/AV             0.68           ---------  Aortic valve area, peak velocity          4.19  cm^2     ---------  Aortic valve area/bsa, peak               1.96  cm^2/m^2 ---------  velocity  Velocity ratio, mean, LVOT/AV             0.66           ---------  Aortic valve area, mean velocity          4.05  cm^2     ---------  Aortic valve area/bsa, mean               1.89  cm^2/m^2 ---------  velocity  Aortic regurg pressure half-time  356   ms       ---------    Aorta                                     Value          Reference  Aortic root ID, ED                        46    mm       ---------  Ascending aorta ID, A-P, S                40    mm       ---------    Left atrium                               Value          Reference  LA ID, A-P, ES                            40    mm       ---------  LA ID/bsa, A-P                            1.87  cm/m^2   <=2.2  LA volume, S                              60    ml        ---------  LA volume/bsa, S                          28.1  ml/m^2   ---------  LA volume, ES, 1-p A4C                    54    ml       ---------  LA volume/bsa, ES, 1-p A4C                25.3  ml/m^2   ---------  LA volume, ES, 1-p A2C                    62    ml       ---------  LA volume/bsa, ES, 1-p A2C                29    ml/m^2   ---------    Mitral valve                              Value          Reference  Mitral E-wave peak velocity               58.2  cm/s     ---------  Mitral A-wave peak velocity               76    cm/s     ---------  Mitral deceleration time          (H)     271   ms  150 - 230  Mitral E/A ratio, peak                    0.8            ---------    Right ventricle                           Value          Reference  RV s&', lateral, S                         20.8  cm/s     ---------  Legend: (L)  and  (H)  mark values outside specified reference range.  ------------------------------------------------------------------- Lance Morin, M.D. 2018-11-20T17:55:38   TEE: *[facility]*                         [address], [place] [address]                            [phone]  ------------------------------------------------------------------- Transesophageal Echocardiography  Patient:    Larkin, Morelos MR #:       [id number] Study Date: 03/29/2017 Gender:     M Age:        26 Height:     180.3 cm Weight:     88.2 kg BSA:        2.12 m^2 Pt. Status: Room:   ADMITTING    Kirk Ruths  ATTENDING    Kirk Ruths  ORDERING     Kirk Ruths  PERFORMING   Kirk Ruths  REFERRING    Kirk Ruths  SONOGRAPHER  Jannett Celestine, RDCS  cc:  ------------------------------------------------------------------- LV EF: 55% -   60%  ------------------------------------------------------------------- History:   PMH:  Aortic Valve  Disease.  Risk factors: Dyslipidemia.  ------------------------------------------------------------------- Study Conclusions  - Left ventricle: Systolic function was normal. The estimated   ejection fraction was in the range of 55% to 60%. Wall motion was   normal; there were no regional wall motion abnormalities. - Aortic valve: There was a small vegetation on the left   ventricular aspect of the left coronary cusp. There was severe   regurgitation. - Mitral valve: No evidence of vegetation. There was mild   regurgitation. - Right atrium: No evidence of thrombus in the atrial cavity or   appendage. - Tricuspid valve: No evidence of vegetation. - Pulmonic valve: No evidence of vegetation.  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  ------------------------------------------------------------------- Study data:   Study status:  Routine.  Consent:  The risks, benefits, and alternatives to the procedure were explained to the patient and informed consent was obtained.  Procedure:  The patient reported no pain pre or post test. Initial setup. The patient was brought to the laboratory. Surface ECG leads were  monitored. Sedation. Sedation was administered by cardiology staff. Transesophageal echocardiography. A transesophageal probe was inserted by the attending cardiologist. Image quality was adequate.  Study completion:  The patient tolerated the procedure well. There were no complications.          Diagnostic transesophageal echocardiography.  2D and color Doppler.  Birthdate:  Patient birthdate: [birth year].  Age:  Patient is 62 yr old.  Sex:  Gender: male.    BMI: 27.1 kg/m^2.  Blood pressure:     117/50  Patient status:   Outpatient.  Study date:  Study date: 03/29/2017. Study time: 10:58 AM.  Location:  Endoscopy.  -------------------------------------------------------------------  ------------------------------------------------------------------- Left ventricle:  Systolic function was normal. The estimated ejection fraction was in the range of 55% to 60%. Wall motion was normal; there were no regional wall motion abnormalities.  ------------------------------------------------------------------- Aortic valve:   Trileaflet; mildly thickened leaflets. There was a small vegetation on the left ventricular aspect of the left coronary cusp.  Doppler:  There was severe regurgitation.  ------------------------------------------------------------------- Aorta:  Descending aorta: The descending aorta had mild diffuse disease.  ------------------------------------------------------------------- Mitral valve:   Mildly thickened leaflets . Leaflet separation was normal.  No evidence of vegetation.  Doppler:  There was mild regurgitation.  ------------------------------------------------------------------- Left atrium:  The atrium was normal in size.  ------------------------------------------------------------------- Right ventricle:  The cavity size was normal. Systolic function was normal.  ------------------------------------------------------------------- Pulmonic valve:    Structurally normal valve.   Cusp separation was normal.  No evidence of vegetation.  ------------------------------------------------------------------- Tricuspid valve:   Structurally normal valve.   Leaflet separation was normal.  No evidence of vegetation.  Doppler:  There was mild regurgitation.  ------------------------------------------------------------------- Right atrium:  The atrium was normal in size.  No evidence of thrombus in the atrial cavity or  appendage.  ------------------------------------------------------------------- Pericardium:  There was no pericardial effusion.   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 - RESECTION MARGINS, NEGATIVE FOR MALIGNANCY. - PLEASE SEE ONCOLOGY TEMPLATE FOR DETAILS. Microscopic Comment ONCOLOGY TABLE - TESTIS 1. Specimen and laterality: Right testis 2. Tumor focality: Unifocal 3. Macroscopic extent of tumor: Limited to testicular parenchyma. 4. Maximum tumor size (cm): 6.3 cm 5. Histologic type: Seminoma 6. Microscopic tumor extension: Limited to testis 7. Spermatic cord and surgical margins: Negative 8. Lymph-Vascular invasion: Present 9. Intratubular germ cell neoplasia: Present 10. Lymph nodes: # examined: N/A; # positive: N/A 11. TNM code: pT2, pNX 12. Serum tumor markers: See patient's medical record 13. Comment: The cut surfaces of the tumor show a 6.3 cm well circumscribed tan soft mass. Microscopically, sections show uniform malignant cells with prominent nucleoli, abundant cytoplasm with mitotic activity and tumor necrosis. Angiolymphatic invasion is present. The tumor is limited to testis. 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 Immunohistochemical stains were performed and the neoplastic cells show the following immunoprofile: CD117 - negative OCT4- positive EMA- negative CK AE1/AE3- negative PLAP - positive CD30 - virtually negative CD20 - negative AFP - negative 1 of 2 FINAL for MADOC, HOLQUIN ([id number]) Microscopic Comment(continued) HCG- negative The control stained appropriately. The overall morphologic and immunohistochemical finding are diagnostic for seminoma. The final resection margin is negative. (HCL:kh 09-15-13) H. CATHERINE LI  MD   Aortic Size Index=     4.7    /Body surface area is 2.16 meters squared. = 2.2  < 2.75 cm/m2      4% risk per year  2.75 to 4.25          8% risk per year > 4.25 cm/m2    20% risk per year   Assessment / Plan:    1/Significant aortic insufficiency without overt symptoms of congestive heart failure but with evidence of left ventricular dilatation and relatively well preserved LV function.  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  The patient will complete 6 weeks of IV antibiotics January 16.  I plan to see him back on January 24.  We discussed proceeding with replacement of aortic valve and a sending aorta February 4.  When he is seen on January 24 we will plan repeat blood cultures to ensure negativity before proceeding with valve replacement February 4.  Risks and options of surgery were discussed with the patient and his wife in detail.  We also discussed the pros and cons of mechanical versus tissue valves.  The patient prefers a tissue valve to avoid long-term Coumadin.   The goals risks and alternatives of the planned surgical procedure  have been discussed with the patient in detail. The risks of the procedure including death, infection, heart block and need for permanent  pacemaker  stroke, myocardial infarction, bleeding, blood transfusion have all been discussed specifically.  HARSHA YUSKO is willing  to proceed with the planned  procedure.    [name] MD      [address] [place] [address] Office [phone]   Beeper [phone]  05/02/2017 2:46 PM

## 2017-05-03 ENCOUNTER — Other Ambulatory Visit: Payer: Self-pay | Admitting: Pharmacist

## 2017-05-06 ENCOUNTER — Other Ambulatory Visit: Payer: Self-pay | Admitting: *Deleted

## 2017-05-06 ENCOUNTER — Encounter: Payer: Self-pay | Admitting: Infectious Diseases

## 2017-05-06 DIAGNOSIS — Z5181 Encounter for therapeutic drug level monitoring: Secondary | ICD-10-CM | POA: Diagnosis not present

## 2017-05-06 DIAGNOSIS — I351 Nonrheumatic aortic (valve) insufficiency: Secondary | ICD-10-CM

## 2017-05-06 DIAGNOSIS — I38 Endocarditis, valve unspecified: Secondary | ICD-10-CM | POA: Diagnosis not present

## 2017-05-06 DIAGNOSIS — I318 Other specified diseases of pericardium: Secondary | ICD-10-CM | POA: Diagnosis not present

## 2017-05-09 ENCOUNTER — Other Ambulatory Visit: Payer: Self-pay | Admitting: Pharmacist

## 2017-05-11 DIAGNOSIS — I38 Endocarditis, valve unspecified: Secondary | ICD-10-CM | POA: Diagnosis not present

## 2017-05-13 ENCOUNTER — Telehealth: Payer: Self-pay | Admitting: *Deleted

## 2017-05-13 DIAGNOSIS — I38 Endocarditis, valve unspecified: Secondary | ICD-10-CM | POA: Diagnosis not present

## 2017-05-13 DIAGNOSIS — Z7689 Persons encountering health services in other specified circumstances: Secondary | ICD-10-CM | POA: Diagnosis not present

## 2017-05-13 DIAGNOSIS — Z5181 Encounter for therapeutic drug level monitoring: Secondary | ICD-10-CM | POA: Diagnosis not present

## 2017-05-13 NOTE — Telephone Encounter (Signed)
Wife states that patient's IV ABX is to end Wed., 1/16//19 per [facility].  Who will take out the [facility] line?  RN spoke with Dr. Johnnye Sima.  Verbal order received for [facility] to be removed after last dose if IV ABX on 05/15/17.  Order called to Stanton Kidney, RN at [facility].  Patient needs to be scheduled for [facility] appointment with either Dr. Johnnye Sima or Dr. Linus Salmons.  Scheduled with Dr. Johnnye Sima for 05/15/17 @ 1430.

## 2017-05-15 ENCOUNTER — Ambulatory Visit: Payer: 59 | Admitting: Infectious Diseases

## 2017-05-15 DIAGNOSIS — B957 Other staphylococcus as the cause of diseases classified elsewhere: Secondary | ICD-10-CM

## 2017-05-15 DIAGNOSIS — C4441 Basal cell carcinoma of skin of scalp and neck: Secondary | ICD-10-CM

## 2017-05-15 DIAGNOSIS — I330 Acute and subacute infective endocarditis: Secondary | ICD-10-CM | POA: Diagnosis not present

## 2017-05-15 NOTE — Progress Notes (Signed)
   Subjective:    Patient ID: [name] Anderson, male    DOB: Mar 01, 1956, 62 y.o.   MRN: [id number]  HPI 62 yo M with hx of Aortic insufficiency and then syncopal event in August. He then developed worsening fatigue and underwent TEE which showed a mobile mass on the Ao valve. He was started on vanco/unasyn. He had 3/4 positive BCx for Coag Neg staph. He was transitioned to ancef and was d/c home on 12-5.   He has been doing well. He feels like his strength has impoved. He has no further night sweats.  No problems with his PIC.  He has had pain in his hips with ambulation. He was seen by Rheum who did plain films which did not show any abnormalities (per pt).    Review of Systems  Constitutional: Negative for appetite change, chills, fever and unexpected weight change.  Gastrointestinal: Negative for constipation and diarrhea.  Genitourinary: Negative for difficulty urinating.  Musculoskeletal: Positive for arthralgias.  Neurological: Negative for dizziness and light-headedness.  Psychiatric/Behavioral: Negative for sleep disturbance.  Please see HPI. All other systems reviewed and negative.     Objective:   Physical Exam  Constitutional: He appears well-developed and well-nourished.  HENT:  Mouth/Throat: No oropharyngeal exudate.  Eyes: EOM are normal. Pupils are equal, round, and reactive to light.  Neck: Neck supple.  Cardiovascular: Normal rate and regular rhythm.  Murmur heard. Pulmonary/Chest: Effort normal and breath sounds normal.  Abdominal: Soft. Bowel sounds are normal. There is no tenderness. There is no rebound.  Musculoskeletal: He exhibits no edema.       Arms: No nail bed lesions.   Lymphadenopathy:    He has no cervical adenopathy.  Skin:     Psychiatric: He has a normal mood and affect.      Assessment & Plan:

## 2017-05-15 NOTE — Assessment & Plan Note (Signed)
He has an area of scaling and erythema on his R temple. I encouraged him to f/u with his dermatologist for this (Dr Denna Haggard).

## 2017-05-15 NOTE — Assessment & Plan Note (Addendum)
He is doing well He will complete his anbx today.  [facility] to pull his pic tomorrow.  He will repeat BCx on 05-20-17 as well as CRP and ESR. He is given a prescription for this.  My only concern is his hip pain. If his ESR or CRP are elevated, consider MRI of his hips to r/o septic arthritis.  He will f/u with CV and CVTS for his planned AVR and aneurysm repair next month.  I will call him with results of his BCx ESR and CRP from [facility].

## 2017-05-17 ENCOUNTER — Other Ambulatory Visit: Payer: Self-pay | Admitting: Pharmacist

## 2017-05-20 DIAGNOSIS — Z5181 Encounter for therapeutic drug level monitoring: Secondary | ICD-10-CM | POA: Diagnosis not present

## 2017-05-20 DIAGNOSIS — Z7689 Persons encountering health services in other specified circumstances: Secondary | ICD-10-CM | POA: Diagnosis not present

## 2017-05-20 DIAGNOSIS — I38 Endocarditis, valve unspecified: Secondary | ICD-10-CM | POA: Diagnosis not present

## 2017-05-21 ENCOUNTER — Telehealth: Payer: Self-pay | Admitting: Infectious Diseases

## 2017-05-22 NOTE — Telephone Encounter (Signed)
error 

## 2017-05-23 ENCOUNTER — Encounter: Payer: Self-pay | Admitting: Cardiothoracic Surgery

## 2017-05-23 ENCOUNTER — Other Ambulatory Visit: Payer: Self-pay

## 2017-05-23 ENCOUNTER — Other Ambulatory Visit: Payer: Self-pay | Admitting: *Deleted

## 2017-05-23 ENCOUNTER — Ambulatory Visit: Payer: 59 | Admitting: Cardiothoracic Surgery

## 2017-05-23 VITALS — BP 127/62 | HR 74 | Resp 18 | Ht 71.0 in | Wt 208.0 lb

## 2017-05-23 DIAGNOSIS — Z01818 Encounter for other preprocedural examination: Secondary | ICD-10-CM

## 2017-05-23 DIAGNOSIS — I358 Other nonrheumatic aortic valve disorders: Secondary | ICD-10-CM | POA: Diagnosis not present

## 2017-05-23 DIAGNOSIS — I712 Thoracic aortic aneurysm, without rupture: Secondary | ICD-10-CM

## 2017-05-23 DIAGNOSIS — I7121 Aneurysm of the ascending aorta, without rupture: Secondary | ICD-10-CM

## 2017-05-23 DIAGNOSIS — M009 Pyogenic arthritis, unspecified: Secondary | ICD-10-CM

## 2017-05-23 NOTE — Progress Notes (Deleted)
[address]  [place] [address]  [phone]  [name] Anderson  [place] Medical Record #[id number]  Date of Birth: [birth year]  Referring: Dr Stanford Breed  Primary Care: Antony Contras, MD  Primary Cardiologist: No primary care provider on file.  Chief Complaint: Routine follow-up for aortic insufficiency, noted to have significantly more aortic insufficiency, likely endocarditis  History of Present Illness:  Patient is a 62 year old male with known previous cardiac history involvingaortic insufficiency and mildly dilated ascending aorta. The patient has been followed for several years with serial echocardiograms and CTA of the chest. About 5 weeks ago and echocardiogram was done echo suggested more aortic insufficiency. Repeat CTA of the chest suggested possible splenic infarcts. TEE was performed because of these findings and suggested possible endocarditis, with increased aortic insufficiency and possible vegetation. Although the patient did not present because of symptoms, in retrospect he had felt more fatigued over the past several months, but denies any specific infections, denies fever and chills, has had no dental recent dental work, he has been noted to be anemic and recent upper GI endoscopy and colonoscopy were performed without specific causes of the anemia.  Following the TEE the patient was admitted to 3 blood cultures were obtained and grew  Other than fatigue the patient has no significant symptoms of congestive heart failure, he was with a MRI of the brain in early November for 55-monthhistory of dizziness. MRI was negative for stroke March 12, 2017.  Patient will complete 6 weeks of IV ceFAZolin (ANCEF) IV on January 16. He is to have dental work done on January 7. Since discharge from the hospital patient has had a cardiac catheterization that showed no significant coronary artery disease. He notes that overall he feels better denies fever chills, shortness of breath  is improved.  Current Activity/ Functional Status:  Patient is independent with mobility/ambulation, transfers, ADL's, IADL's.  Zubrod Score:  At the time of surgery this patient's most appropriate activity status/level should be described as:  0 Normal activity, no symptoms  1 Restricted in physical strenuous activity but ambulatory, able to do out light work  2 Ambulatory and capable of self care, unable to do work activities, up and about more than 50% Of the time  3 Only limited self care, in bed greater than 50% of waking hours  4 Completely disabled, no self care, confined to bed or chair  5 Moribund      Past Medical History:  Diagnosis Date  . Allergy   . Anemia   . Barrett's esophagus - short segment 03/13/2017  . [place]   testicular  . ED (erectile dysfunction)   . Endocarditis    /[facility]11/30/2018  . H/O aneurysm 03/05/2017  . High cholesterol   . History of basal cell carcinoma excision    02/2002 -- NOSE & 2013 RIGHT EAR  . History of kidney stones   . Hyperlipidemia   . Mild obstructive sleep apnea    PER STUDY 01-18-2005-- NO CPAP OR MOUTH GUARD  . Moderate aortic valve insufficiency   . Multiple pulmonary nodules    PER CT-- PROBABLE GRANULOMATOUS  . Personal history of colonic polyps    TUBULAR ADENOMA--   . Seasonal allergies   . 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   . Testicular mass    RIGHT        Past Surgical History:  Procedure Laterality Date  . COLONOSCOPY  01/24/06, 07/10/11  . EXTRACORPOREAL SHOCK WAVE LITHOTRIPSY    .  HERNIA REPAIR    . MOHS SURGERY  02/2002 & 2013   TIP OF NOSE-/// RIGHT EAR  . ORCHIECTOMY Right 09/11/2013   Procedure: RIGHT ORCHIECTOMY; Surgeon: Jorja Loa, MD; Location: [facility]; Service: Urology; Laterality: Right;  . RIGHT/LEFT HEART CATH AND CORONARY ANGIOGRAPHY N/A 04/18/2017   Procedure: RIGHT/LEFT HEART CATH AND CORONARY ANGIOGRAPHY; Surgeon: Sherren Mocha, MD; Location: [facility]; Service: Cardiovascular; Laterality: N/A;  . SCROTAL EXPLORATION Right 09/11/2013   Procedure: RIGHT INGUINAL EXPLORATION; Surgeon: Jorja Loa, MD; Location: [facility]; Service: Urology; Laterality: Right;  . TEE WITHOUT CARDIOVERSION N/A 03/29/2017   Procedure: TRANSESOPHAGEAL ECHOCARDIOGRAM (TEE); Surgeon: Lelon Perla, MD; Location: [facility] ENDOSCOPY; Service: Cardiovascular; Laterality: N/A;  . TRANSTHORACIC ECHOCARDIOGRAM  02-25-2013 DR CRENSHAW   NORMAL LVSF/ EF 94-80%/ GRADE 2 DIASTOLIC DYSFUNCTION/ ECCENTRIC MODERATE AORTIC INSUFFICIENCY WITHOUT STENOSIS/ MILD DILATED AORTIC ROOT/ TRIVIAL MR, TR, & PR  . UMBILICAL HERNIA REPAIR  06-08-2009  . URETEROSCOPIC LASER LITHOTRIPSY STONE EXTRACTION  2007   Social History       Tobacco Use  Smoking Status Current Some Day Smoker  . Types: Pipe  Smokeless Tobacco Former Systems developer  . Types: Chew  . Quit date: 04/26/2011  Tobacco Comment   OCCASIONAL PIPE SMOKER (NEVER SMOKED CIGARETTES)   Social History       Substance and Sexual Activity  Alcohol Use Yes   Comment: 03/29/2017 "might have a glass of wine twice/year"   Social History        Socioeconomic History  . Marital status: Married    Spouse name: Coralyn Mark  . Number of children: 2  . Years of education: 12  Occupational History  . 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 Smoking status: Current Some Day Smoker    Types: Pipe  . Smokeless tobacco: Former Systems developer    Types: [place] date: 04/26/2011  . Tobacco comment: OCCASIONAL PIPE SMOKER (NEVER SMOKED CIGARETTES)  Substance and Sexual Activity  . Alcohol use: Yes    Comment: 03/29/2017 "might have a glass of wine twice/year"  . Drug use: No  . Sexual activity: Yes  Other Topics Concern  . Not on file  Social History Narrative   Lives with wife Coralyn Mark   Caffeine use: coffee- 1 cup per day   No Known Allergies        Current Outpatient Medications  Medication Sig Dispense  Refill  . acetaminophen (TYLENOL) 500 MG tablet Take 1,000 mg by mouth every 6 (six) hours as needed (for pain.).     Marland Kitchen aluminum chloride (HYPERCARE) 20 % external solution Apply 1 application topically 3 (three) times daily as needed (for sweating.).    Marland Kitchen amoxicillin (AMOXIL) 500 MG capsule TAKE 4 TABLETS BY MOUTH 1 HOUR PRIOR TO DENTAL APPOINTMENT 4 capsule 1  . Ascorbic Acid (VITAMIN C) 1000 MG tablet Take 1,000 mg by mouth daily.    Marland Kitchen aspirin 81 MG chewable tablet Chew 81 mg by mouth daily.    Marland Kitchen atorvastatin (LIPITOR) 10 MG tablet Take 1 tablet (10 mg total) by mouth daily at 6 PM. 30 tablet 6  . ceFAZolin (ANCEF) IVPB Inject 2 g into the vein every 8 (eight) hours. Indication: CONS endocarditis  Last Day of Therapy: 05/15/17  Labs - Once weekly: CBC/D and BMP,  Labs - Every other week: ESR and CRP 126 Units 0  . CVS TRIPLE MAGNESIUM COMPLEX PO Take 1 tablet by mouth 2 (  two) times daily.    Marland Kitchen ECHINACEA PO Take 760 mg by mouth daily.    . Ferrous Sulfate (IRON) 325 (65 Fe) MG TABS Take 325 mg by mouth daily.     . Glucosamine-Chondroitin (COSAMIN DS PO) Take 1 tablet by mouth 2 (two) times daily.    Marland Kitchen losartan (COZAAR) 50 MG tablet Take 1 tablet (50 mg total) by mouth daily. 90 tablet 3  . Naphazoline-Pheniramine (OPCON-A) 0.027-0.315 % SOLN Place 1-2 drops into both eyes 3 (three) times daily as needed (for allergy eyes.).    Marland Kitchen NON FORMULARY Take 2 capsules by mouth 2 (two) times daily. DoTerra (Microplex VMz) 2 Capsule twice daily    . NON FORMULARY Take 2 capsules by mouth 2 (two) times daily. XE0 Mega "Doterra"    . sildenafil (REVATIO) 20 MG tablet Take 20 mg by mouth daily as needed (for ED).     . SUPER B COMPLEX/C PO Take 1 tablet by mouth daily.    Marland Kitchen triamcinolone cream (KENALOG) 0.1 % Apply 1 application topically daily as needed (FOR DRY SKIN/ITCHY SKIN.). Apply to area after bath (DO NOT APPLY TO FACE)  1   No current facility-administered medications for this visit.   (Not in  a hospital admission)       Family History  Problem Relation Age of Onset  . Colon cancer Father   . Heart disease Paternal Grandmother    Review of Systems:  Pertinent items are noted in HPI.  Review of Systems  Constitutional: Negative for chills, diaphoresis, fever, malaise/fatigue and weight loss.  HENT: Negative.  Eyes: Negative.  Respiratory: Positive for shortness of breath. Negative for cough, hemoptysis, sputum production and wheezing.  Cardiovascular: Negative for chest pain, palpitations, orthopnea, claudication, leg swelling and PND.  Gastrointestinal: Negative for abdominal pain, blood in stool, constipation, diarrhea, heartburn, melena, nausea and vomiting.  Genitourinary: Negative.  Musculoskeletal: Negative.  Skin: Negative for itching and rash.  Neurological: Negative. Negative for weakness.  Endo/Heme/Allergies: Negative.  Psychiatric/Behavioral: Negative.   Immunizations: Flu [n]; Pneumococcal[ n ];  Physical Exam:  BP (!) 147/66  Pulse 75  Resp 20  Ht '5\' 11"'  (1.803 m)  Wt 206 lb (93.4 kg)  SpO2 98% Comment: RA  BMI 28.73 kg/m  General appearance: alert, cooperative, appears stated age and no distress  Head: Normocephalic, without obvious abnormality, atraumatic  Neck: no adenopathy, no carotid bruit, no JVD, supple, symmetrical, trachea midline and thyroid not enlarged, symmetric, no tenderness/mass/nodules  Lymph nodes: Cervical, supraclavicular, and axillary nodes normal.  Resp: clear to auscultation bilaterally  Back: symmetric, no curvature. ROM normal. No CVA tenderness.  Cardio: regular rate and rhythm, S1, S2 normal, no murmur, click, rub or gallop  GI: soft, non-tender; bowel sounds normal; no masses, no organomegaly  Extremities: extremities normal, atraumatic, no cyanosis or edema  Neurologic: Grossly normal  Palpable DP and PT pulses  Diagnostic Studies & Laboratory data:  Recent Lab Findings:  Recent Labs  Micro:         Results for orders placed or performed during the hospital encounter of 03/29/17  Culture, blood (routine x 2) Status: Abnormal   Collection Time: 03/29/17 12:30 PM  Result Value Ref Range Status   Specimen Description BLOOD RIGHT ANTECUBITAL  Final   Special Requests   Final    BOTTLES DRAWN AEROBIC ONLY Blood Culture adequate volume   Culture Setup Time   Final    GRAM POSITIVE COCCI IN CLUSTERS  AEROBIC BOTTLE ONLY  CRITICAL VALUE NOTED. VALUE IS CONSISTENT WITH PREVIOUSLY REPORTED AND CALLED VALUE.    Culture (A)  Final    STAPHYLOCOCCUS SPECIES (COAGULASE NEGATIVE)  SUSCEPTIBILITIES PERFORMED ON PREVIOUS CULTURE WITHIN THE LAST 5 DAYS.    Report Status 04/02/2017 FINAL  Final  Culture, blood (routine x 2) Status: Abnormal   Collection Time: 03/29/17 12:32 PM  Result Value Ref Range Status   Specimen Description BLOOD LEFT HAND  Final   Special Requests   Final    BOTTLES DRAWN AEROBIC ONLY Blood Culture adequate volume   Culture Setup Time   Final    GRAM POSITIVE COCCI IN CLUSTERS  AEROBIC BOTTLE ONLY  CRITICAL RESULT CALLED TO, READ BACK BY AND VERIFIED WITH: A. JOHNSTON,PHARMD 2056 03/30/2017 T. TYSOR    Culture STAPHYLOCOCCUS SPECIES (COAGULASE NEGATIVE) (A)  Final   Report Status 04/02/2017 FINAL  Final   Organism ID, Bacteria STAPHYLOCOCCUS SPECIES (COAGULASE NEGATIVE)  Final  Susceptibility   Staphylococcus species (coagulase negative) - MIC*    CIPROFLOXACIN <=0.5 SENSITIVE Sensitive     ERYTHROMYCIN >=8 RESISTANT Resistant     GENTAMICIN <=0.5 SENSITIVE Sensitive     OXACILLIN <=0.25 SENSITIVE Sensitive     TETRACYCLINE <=1 SENSITIVE Sensitive     VANCOMYCIN 1 SENSITIVE Sensitive     TRIMETH/SULFA <=10 SENSITIVE Sensitive     CLINDAMYCIN <=0.25 SENSITIVE Sensitive     RIFAMPIN <=0.5 SENSITIVE Sensitive     Inducible  Clindamycin NEGATIVE Sensitive    * STAPHYLOCOCCUS SPECIES (COAGULASE NEGATIVE)  Blood Culture ID Panel (Reflexed) Status: Abnormal   Collection Time: 03/29/17 12:32 PM  Result Value Ref Range Status   Enterococcus species NOT DETECTED NOT DETECTED Final   Listeria monocytogenes NOT DETECTED NOT DETECTED Final   Staphylococcus species DETECTED (A) NOT DETECTED Final    Comment: Methicillin (oxacillin) susceptible coagulase negative staphylococcus. Possible blood culture contaminant (unless isolated from more than one blood culture draw or clinical case suggests pathogenicity). No antibiotic treatment is indicated for blood  culture contaminants.  CRITICAL RESULT CALLED TO, READ BACK BY AND VERIFIED WITH:  A. JOHNSTON,PHARMD 2056 03/30/2017 T. TYSOR    Staphylococcus aureus NOT DETECTED NOT DETECTED Final   Methicillin resistance NOT DETECTED NOT DETECTED Final   Streptococcus species NOT DETECTED NOT DETECTED Final   Streptococcus agalactiae NOT DETECTED NOT DETECTED Final   Streptococcus pneumoniae NOT DETECTED NOT DETECTED Final   Streptococcus pyogenes NOT DETECTED NOT DETECTED Final   Acinetobacter baumannii NOT DETECTED NOT DETECTED Final   Enterobacteriaceae species NOT DETECTED NOT DETECTED Final   Enterobacter cloacae complex NOT DETECTED NOT DETECTED Final   Escherichia coli NOT DETECTED NOT DETECTED Final   Klebsiella oxytoca NOT DETECTED NOT DETECTED Final   Klebsiella pneumoniae NOT DETECTED NOT DETECTED Final   Proteus species NOT DETECTED NOT DETECTED Final   Serratia marcescens NOT DETECTED NOT DETECTED Final   Haemophilus influenzae NOT DETECTED NOT DETECTED Final   Neisseria meningitidis NOT DETECTED NOT DETECTED Final  Pseudomonas aeruginosa NOT DETECTED NOT DETECTED Final   Candida albicans NOT DETECTED NOT DETECTED Final   Candida glabrata NOT DETECTED NOT DETECTED Final   Candida krusei NOT DETECTED NOT DETECTED Final   Candida parapsilosis NOT DETECTED NOT  DETECTED Final   Candida tropicalis NOT DETECTED NOT DETECTED Final  Culture, blood (routine x 2) Status: Abnormal   Collection Time: 03/29/17 12:35 PM  Result Value Ref Range Status   Specimen Description BLOOD RIGHT HAND  Final   Special Requests   Final    BOTTLES DRAWN AEROBIC ONLY Blood Culture adequate volume   Culture Setup Time   Final    GRAM POSITIVE COCCI IN CLUSTERS  AEROBIC BOTTLE ONLY  CRITICAL VALUE NOTED. VALUE IS CONSISTENT WITH PREVIOUSLY REPORTED AND CALLED VALUE.    Culture (A)  Final    STAPHYLOCOCCUS SPECIES (COAGULASE NEGATIVE)  SUSCEPTIBILITIES PERFORMED ON PREVIOUS CULTURE WITHIN THE LAST 5 DAYS.    Report Status 04/02/2017 FINAL  Final  Culture, blood (routine x 2) Status: Abnormal   Collection Time: 03/29/17 9:17 PM  Result Value Ref Range Status   Specimen Description BLOOD LEFT ANTECUBITAL  Final   Special Requests Blood Culture adequate volume IN PEDIATRIC BOTTLE  Final   Culture Setup Time   Final    GRAM POSITIVE COCCI  IN PEDIATRIC BOTTLE  CRITICAL VALUE NOTED. VALUE IS CONSISTENT WITH PREVIOUSLY REPORTED AND CALLED VALUE.    Culture (A)  Final    STAPHYLOCOCCUS SPECIES (COAGULASE NEGATIVE)  SUSCEPTIBILITIES PERFORMED ON PREVIOUS CULTURE WITHIN THE LAST 5 DAYS.    Report Status 04/02/2017 FINAL  Final  Culture, blood (routine x 2) Status: Abnormal   Collection Time: 03/29/17 9:21 PM  Result Value Ref Range Status   Specimen Description BLOOD LEFT HAND  Final   Special Requests Blood Culture adequate volume IN PEDIATRIC BOTTLE  Final   Culture Setup Time   Final    GRAM POSITIVE COCCI IN CLUSTERS  IN PEDIATRIC BOTTLE  CRITICAL VALUE NOTED. VALUE IS CONSISTENT WITH PREVIOUSLY REPORTED AND CALLED VALUE.    Culture (A)  Final    STAPHYLOCOCCUS SPECIES (COAGULASE NEGATIVE)  SUSCEPTIBILITIES PERFORMED ON PREVIOUS CULTURE WITHIN THE LAST 5 DAYS.    Report Status 04/02/2017 FINAL  Final  Culture, blood (routine x 2) Status: None   Collection Time:  04/01/17 5:30 AM  Result Value Ref Range Status   Specimen Description BLOOD RIGHT ANTECUBITAL  Final   Special Requests   Final    BOTTLES DRAWN AEROBIC AND ANAEROBIC Blood Culture adequate volume   Culture NO GROWTH 5 DAYS  Final   Report Status 04/06/2017 FINAL  Final  Culture, blood (routine x 2) Status: None   Collection Time: 04/01/17 5:35 AM  Result Value Ref Range Status   Specimen Description BLOOD LEFT ANTECUBITAL  Final   Special Requests   Final    BOTTLES DRAWN AEROBIC AND ANAEROBIC Blood Culture adequate volume   Culture NO GROWTH 5 DAYS  Final   Report Status 04/06/2017 FINAL  Final   Recent Radiology Findings:  Mr Virgel Paling PT Contrast  Result Date: 03/12/2017  CLINICAL DATA: Dizziness for 3 months. Hypertension. EXAM: MRA HEAD WITHOUT CONTRAST TECHNIQUE: Angiographic images of the Circle of Willis were obtained using MRA technique without intravenous contrast. COMPARISON: None. FINDINGS: Anterior circulation: Slightly smaller right ICA in the setting of relative right A1 hypoplasia. Robust anterior communicating artery. Vessels are smooth and widely patent. Negative for aneurysm. Posterior circulation: Left dominant vertebral artery. There  is symmetric loss of signal in the proximal V4 segments and picas due to partial horizontal course. There is antegrade flow in both vertebral arteries on recent carotid Doppler. The vessels are smooth and widely patent. A small left posterior communicating artery is present. Negative for aneurysm. IMPRESSION: Normal intracranial MRA. Electronically Signed By: Monte Fantasia M.D. On: 03/12/2017 09:00  Ct Angio Chest Aorta W &/or Wo Contrast  Result Date: 03/19/2017  CLINICAL DATA: 62 year old male with a history of thoracic aortic aneurysm EXAM: CT ANGIOGRAPHY CHEST WITH CONTRAST TECHNIQUE: Multidetector CT imaging of the chest was performed using the standard protocol during bolus administration of intravenous contrast. Multiplanar CT image  reconstructions and MIPs were obtained to evaluate the vascular anatomy. CONTRAST: 172m ISOVUE-370 IOPAMIDOL (ISOVUE-370) INJECTION 76% COMPARISON: Prior CTA of the chest 03/29/2016 FINDINGS: Cardiovascular: Stable mild aneurysmal dilatation of the aortic root. Precise measurements are challenging giving artifact related to cardiac motion, however the maximal diameter does not exceed 4.7 cm as previously measured. In fact, I suspect the true measurement to be closer to 4.4 cm. The tubular portion of the ascending thoracic aorta remains stable at 3.8 cm. The transverse and descending thoracic aorta are normal in caliber. No significant aortic plaque. Mild left ventricular dilatation. No thrombus visualized within the cardiac chambers or left atrial appendage. Mediastinum/Nodes: Unremarkable CT appearance of the thyroid gland. No suspicious mediastinal or hilar adenopathy. No soft tissue mediastinal mass. The thoracic esophagus is unremarkable. Lungs/Pleura: Lungs are clear. No pleural effusion or pneumothorax. Upper Abdomen: Interval development of multiple wedge-shaped regions of low attenuation in the superior aspect of the spleen as well as an approximately 6.6 x 5.2 cm low-attenuation region in the inferior aspect of the spleen. These findings are new compared to prior imaging. Musculoskeletal: No chest wall abnormality. No acute or significant osseous findings. Review of the MIP images confirms the above findings. IMPRESSION: 1. Stable mild aneurysmal dilatation of the aortic root which is no larger than 4.7 cm. I suspect the 4.7 cm measurement may be slightly exaggerated by cardiac motion artifact which is been present across multiple prior studies. The true measurement of the aortic root is likely closer to 4.4 cm. Consider cardiac gated CTA of the next annual follow-up evaluation. 2. Interval development of multiple wedge-shaped regions of low attenuation in the superior aspect of the spleen as well as a new  6.6 cm low-attenuation region in the inferior aspect of the spleen. 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 There is no significant or irregular atherosclerotic plaque in the thoracic, or upper abdominal aorta to serve as a donor site for emboli. Does the patient have a clinical history of atrial fibrillation? Has the patient experienced episodes of left upper quadrant pain over the past several weeks or months? 3. Left ventricular dilatation. Aortic aneurysm NOS (ICD10-I71.9). Signed, HCriselda Peaches MD [facility] Electronically Signed By: HJacqulynn CadetM.D. On: 03/19/2017 10:07  I have independently reviewed the above radiology studies and reviewed the findings with the patient.  CATH: Procedures  RIGHT/LEFT HEART CATH AND CORONARY ANGIOGRAPHY  Conclusion  1. Widely patent coronary arteries (right dominant)  2. Normal right and left heart pressures  Coronary Findings  Diagnostic  Dominance: Right  Left Main  Vessel is angiographically normal.  Left Anterior Descending  Vessel is angiographically normal.  First Diagonal Branch  The LAD is patent to the LV apex without obstructive disease.  Left Circumflex  Large vessel, angiographically normal.  Second Obtuse Marginal Branch  Vessel is angiographically normal.  Right Coronary Artery  Vessel is angiographically normal. Large, dominant vessel, angiographically normal  Hemodynamic Data    Most Recent Value  Fick Cardiac Output 6.78 L/min  Fick Cardiac Output Index 3.32 (L/min)/BSA  RA A Wave 6 mmHg  RA V Wave 4 mmHg  RA Mean 5 mmHg  RV Systolic Pressure 33 mmHg  RV Diastolic Pressure 3 mmHg  RV EDP 9 mmHg  PA Systolic Pressure 29 mmHg  PA Diastolic Pressure 12 mmHg  PA Mean 19 mmHg  PW A Wave 12 mmHg  PW V Wave 12 mmHg  PW Mean 9 mmHg  AO Systolic Pressure 130 mmHg  AO Diastolic Pressure 54 mmHg  AO Mean 80 mmHg  LV Systolic  Pressure 865 mmHg  LV Diastolic Pressure 9 mmHg  LV EDP 15 mmHg  Arterial Occlusion Pressure Extended Systolic Pressure 784 mmHg  Arterial Occlusion Pressure Extended Diastolic Pressure 60 mmHg  Arterial Occlusion Pressure Extended Mean Pressure 82 mmHg  Left Ventricular Apex Extended Systolic Pressure 696 mmHg  Left Ventricular Apex Extended Diastolic Pressure 8 mmHg  Left Ventricular Apex Extended EDP Pressure 14 mmHg  QP/QS 0.96  TPVR Index 5.96 HRUI  TSVR Index 24.09 HRUI  PVR SVR Ratio 0.14  TPVR/TSVR Ratio   ECHO:  Result status: Edited Result - FINAL  [name]  [address], [place] [address]  [phone]  -------------------------------------------------------------------  Echocardiography  (Report amended )  Patient: [name] Anderson, [place]  MR #: [id number]  Study Date: 03/19/2017  Gender: M  Age: 65  Height: 180.3 cm  Weight: 89.8 kg  BSA: 2.14 m^2  Pt. Status:  Room:  SONOGRAPHER Cindy Hazy, RDCS  ATTENDING Ena Dawley, M.D.  PERFORMING [facility], Outpatient  Joanna Hews [phone]  [place] Guardian [phone]  cc:  -------------------------------------------------------------------  LV EF: 50% - 55%  -------------------------------------------------------------------  Indications: I35.9 Aortic Valve Disorder.  -------------------------------------------------------------------  History: PMH: Acquired from the patient and from the patient&'s  chart. PMH: Obstructive Sleep Apnea-CPAP. Dilated Aortic Root.  Risk factors: Former tobacco use. Dyslipidemia.  -------------------------------------------------------------------  Study Conclusions  - Left ventricle: The cavity size was severely dilated. Wall  thickness was normal. Systolic function was normal. The estimated  ejection fraction was in the range of 50% to 55%. Wall motion was  normal; there were no regional wall motion abnormalities. Doppler  parameters are consistent with  abnormal left ventricular  relaxation (grade 1 diastolic dysfunction). There was no evidence  of elevated ventricular filling pressure by Doppler parameters.  - Aortic valve: There was severe regurgitation.  - Aortic root: The aortic root was moderately dilated measuring 46  mm.  - Ascending aorta: The ascending aorta was upper normal size  measuring 40 mm.  - Mitral valve: There was mild regurgitation.  - Left atrium: The atrium was mildly dilated.  - Right ventricle: The cavity size was normal. Wall thickness was  normal. Systolic function was normal.  - Right atrium: The atrium was normal in size.  - Inferior vena cava: The vessel was normal in size.  - Pericardium, extracardiac: There was no pericardial effusion.  Impressions:  - When compared to the prior study from 04/13/2016 there are  significant changes.  LV size has increased from normal to moderately to severely  dilated.  LVEF has decreased from 60-65% to 50-55%.  Aortic root remains moderately dilated with now severe aortic  regurgitation.  There is partially flail or  redundant chordae of the anterior  mitral valve leaflet with no significant mitral regurgitation.  CT surgery consult should be considered.  -------------------------------------------------------------------  Study data: Study status: Routine. Procedure: The patient  reported no pain pre or post test. Transthoracic echocardiography  for left ventricular function evaluation, for right ventricular  function evaluation, and for assessment of valvular function. Image  quality was adequate. Study completion: There were no  complications. Echocardiography. M-mode, complete 2D,  spectral Doppler, and color Doppler. Birthdate: Patient  birthdate: [birth year]. Age: Patient is 62 yr old. Sex: Gender:  male. BMI: 27.6 kg/m^2. Blood pressure: 125/64 Patient  status: Outpatient. Study date: Study date: 03/19/2017. Study  time: 08:42 AM. Location: [facility]    -------------------------------------------------------------------  -------------------------------------------------------------------  Left ventricle: The cavity size was severely dilated. Wall  thickness was normal. Systolic function was normal. The estimated  ejection fraction was in the range of 50% to 55%. Wall motion was  normal; there were no regional wall motion abnormalities. Doppler  parameters are consistent with abnormal left ventricular relaxation  (grade 1 diastolic dysfunction). There was no evidence of elevated  ventricular filling pressure by Doppler parameters.  -------------------------------------------------------------------  Aortic valve: Trileaflet; mildly thickened, mildly calcified  leaflets. Mobility was not restricted. Doppler: Transvalvular  velocity was within the normal range. There was no stenosis. There  was severe regurgitation. VTI ratio of LVOT to aortic valve:  0.73. Valve area (VTI): 4.47 cm^2. Indexed valve area (VTI): 2.09  cm^2/m^2. Peak velocity ratio of LVOT to aortic valve: 0.68. Valve  area (Vmax): 4.19 cm^2. Indexed valve area (Vmax): 1.96 cm^2/m^2.  Mean velocity ratio of LVOT to aortic valve: 0.66. Valve area  (Vmean): 4.05 cm^2. Indexed valve area (Vmean): 1.89 cm^2/m^2.  Mean gradient (S): 7 mm Hg. Peak gradient (S): 12 mm Hg.  -------------------------------------------------------------------  Aorta: Aortic root: The aortic root was moderately dilated  measuring 46 mm.  Ascending aorta: The ascending aorta was upper normal size  measuring 40 mm.  -------------------------------------------------------------------  Mitral valve: Structurally normal valve. Mobility was not  restricted. Doppler: Transvalvular velocity was within the normal  range. There was no evidence for stenosis. There was mild  regurgitation.  -------------------------------------------------------------------  Left atrium: The atrium was mildly dilated.   -------------------------------------------------------------------  Right ventricle: The cavity size was normal. Wall thickness was  normal. Systolic function was normal.  -------------------------------------------------------------------  Pulmonic valve: Structurally normal valve. Cusp separation was  normal. Doppler: Transvalvular velocity was within the normal  range. There was no evidence for stenosis. There was trivial  regurgitation.  -------------------------------------------------------------------  Tricuspid valve: Structurally normal valve. Doppler:  Transvalvular velocity was within the normal range. There was no  regurgitation.  -------------------------------------------------------------------  Pulmonary artery: The main pulmonary artery was normal-sized.  Systolic pressure could not be accurately estimated.  -------------------------------------------------------------------  Right atrium: The atrium was normal in size.  -------------------------------------------------------------------  Pericardium: There was no pericardial effusion.  -------------------------------------------------------------------  Systemic veins:  Inferior vena cava: The vessel was normal in size.  -------------------------------------------------------------------  Measurements  Left ventricle Value Reference  LV ID, ED, PLAX chordal (H) 64 mm 43 - 52  LV ID, ES, PLAX chordal (H) 46.1 mm 23 - 38  LV fx shortening, PLAX chordal (L) 28 % >=29  LV PW thickness, ED 10 mm ---------  IVS/LV PW ratio, ED 0.9 <=1.3  Stroke volume, 2D 161 ml ---------  Stroke volume/bsa, 2D 75 ml/m^2 ---------  LV e&', lateral 5.59 cm/s ---------  LV E/e&', lateral 10.41 ---------  LV e&',  medial 8.77 cm/s ---------  LV E/e&', medial 6.64 ---------  LV e&', average 7.18 cm/s ---------  LV E/e&', average 8.11 ---------  Ventricular septum Value Reference  IVS thickness, ED 9.01 mm ---------  LVOT Value  Reference  LVOT ID, S 28 mm ---------  LVOT area 6.16 cm^2 ---------  LVOT ID 28 mm ---------  LVOT peak velocity, S 119 cm/s ---------  LVOT mean velocity, S 85.4 cm/s ---------  LVOT VTI, S 26.1 cm ---------  LVOT peak gradient, S 6 mm Hg ---------  Stroke volume (SV), LVOT DP 160.7 ml ---------  Stroke index (SV/bsa), LVOT DP 75.2 ml/m^2 ---------  Aortic valve Value Reference  Aortic valve peak velocity, S 175 cm/s ---------  Aortic valve mean velocity, S 130 cm/s ---------  Aortic valve VTI, S 36 cm ---------  Aortic mean gradient, S 7 mm Hg ---------  Aortic peak gradient, S 12 mm Hg ---------  VTI ratio, LVOT/AV 0.73 ---------  Aortic valve area, VTI 4.47 cm^2 ---------  Aortic valve area/bsa, VTI 2.09 cm^2/m^2 ---------  Velocity ratio, peak, LVOT/AV 0.68 ---------  Aortic valve area, peak velocity 4.19 cm^2 ---------  Aortic valve area/bsa, peak 1.96 cm^2/m^2 ---------  velocity  Velocity ratio, mean, LVOT/AV 0.66 ---------  Aortic valve area, mean velocity 4.05 cm^2 ---------  Aortic valve area/bsa, mean 1.89 cm^2/m^2 ---------  velocity  Aortic regurg pressure half-time 356 ms ---------  Aorta Value Reference  Aortic root ID, ED 46 mm ---------  Ascending aorta ID, A-P, S 40 mm ---------  Left atrium Value Reference  LA ID, A-P, ES 40 mm ---------  LA ID/bsa, A-P 1.87 cm/m^2 <=2.2  LA volume, S 60 ml ---------  LA volume/bsa, S 28.1 ml/m^2 ---------  LA volume, ES, 1-p A4C 54 ml ---------  LA volume/bsa, ES, 1-p A4C 25.3 ml/m^2 ---------  LA volume, ES, 1-p A2C 62 ml ---------  LA volume/bsa, ES, 1-p A2C 29 ml/m^2 ---------  Mitral valve Value Reference  Mitral E-wave peak velocity 58.2 cm/s ---------  Mitral A-wave peak velocity 76 cm/s ---------  Mitral deceleration time (H) 271 ms 150 - 230  Mitral E/A ratio, peak 0.8 ---------  Right ventricle Value Reference  RV s&', lateral, S 20.8 cm/s ---------  Legend:  (L) and (H) mark values outside specified  reference range.  -------------------------------------------------------------------  Lance Morin, M.D.  2018-11-20T17:55:38  TEE: *[facility]*  [address], [place] [address]  [phone]  -------------------------------------------------------------------  Transesophageal Echocardiography  Patient: [name] Anderson, [name] Anderson  MR #: [id number]  Study Date: 03/29/2017  Gender: M  Age: 25  Height: 180.3 cm  Weight: 88.2 kg  BSA: 2.12 m^2  Pt. Status:  Room:  ADMITTING Kirk Ruths  ATTENDING Kirk Ruths  ORDERING Kirk Ruths  PERFORMING Kirk Ruths  REFERRING Kirk Ruths  SONOGRAPHER Jannett Celestine, RDCS  cc:  -------------------------------------------------------------------  LV EF: 55% - 60%  -------------------------------------------------------------------  History: PMH: Aortic Valve Disease. Risk factors:  Dyslipidemia.  -------------------------------------------------------------------  Study Conclusions  - Left ventricle: Systolic function was normal. The estimated  ejection fraction was in the range of 55% to 60%. Wall motion was  normal; there were no regional wall motion abnormalities.  - Aortic valve: There was a small vegetation on the left  ventricular aspect of the left coronary cusp. There was severe  regurgitation.  - Mitral valve: No evidence of vegetation. There was mild  regurgitation.  - Right atrium: No evidence of thrombus in the atrial  cavity or  appendage.  - Tricuspid valve: No evidence of vegetation.  - Pulmonic valve: No evidence of vegetation.  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    -------------------------------------------------------------------  Study data: Study status: Routine. Consent: The risks,  benefits, and alternatives to the procedure were explained to the  patient and informed consent was obtained. Procedure: The patient  reported no pain pre or post test. Initial setup. The patient was  brought to the laboratory. Surface ECG leads were monitored.  Sedation. Sedation was administered by cardiology staff.  Transesophageal echocardiography. A transesophageal probe was  inserted by the attending cardiologist. Image quality was adequate.  Study completion: The patient tolerated the procedure well. There  were no complications. Diagnostic transesophageal  echocardiography. 2D and color Doppler. Birthdate: Patient  birthdate: 03-Feb-1956. Age: Patient is 63 yr old. Sex: Gender:  male. BMI: 27.1 kg/m^2. Blood pressure: 117/50 Patient  status: Outpatient. Study date: Study date: 03/29/2017. Study  time: 10:58 AM. Location: Endoscopy.  -------------------------------------------------------------------  -------------------------------------------------------------------  Left ventricle: Systolic function was normal. The estimated  ejection fraction was in the range of 55% to 60%. Wall motion was  normal; there were no regional wall motion abnormalities.  -------------------------------------------------------------------  Aortic valve: Trileaflet; mildly thickened leaflets. There was a  small vegetation on the left ventricular aspect of the left  coronary cusp. Doppler: There was severe regurgitation.  -------------------------------------------------------------------  Aorta: Descending aorta: The descending aorta had mild diffuse  disease.  -------------------------------------------------------------------  Mitral valve: Mildly thickened leaflets . Leaflet separation was  normal. No evidence of vegetation. Doppler: There was mild  regurgitation.   -------------------------------------------------------------------  Left atrium: The atrium was normal in size.  -------------------------------------------------------------------  Right ventricle: The cavity size was normal. Systolic function was  normal.  -------------------------------------------------------------------  Pulmonic valve: Structurally normal valve. Cusp separation was  normal. No evidence of vegetation.  -------------------------------------------------------------------  Tricuspid valve: Structurally normal valve. Leaflet separation  was normal. No evidence of vegetation. Doppler: There was mild  regurgitation.  -------------------------------------------------------------------  Right atrium: The atrium was normal in size. No evidence of  thrombus in the atrial cavity or appendage.  -------------------------------------------------------------------  Pericardium: There was no pericardial effusion.  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  - RESECTION MARGINS, NEGATIVE FOR MALIGNANCY.  - PLEASE SEE ONCOLOGY TEMPLATE FOR DETAILS.  Microscopic Comment  ONCOLOGY TABLE - TESTIS  1. Specimen and laterality: Right testis  2. Tumor focality: Unifocal  3. Macroscopic extent of tumor: Limited to testicular parenchyma.  4. Maximum tumor size (cm): 6.3 cm  5. Histologic type: Seminoma  6. Microscopic tumor extension: Limited to testis  7. Spermatic cord and surgical margins: Negative  8. Lymph-Vascular invasion: Present  9. Intratubular germ cell neoplasia: Present  10. Lymph nodes: # examined: N/A; # positive: N/A  11. TNM code: pT2, pNX  12. Serum tumor markers: See patient's medical record  13. Comment: The cut surfaces of the tumor show a 6.3 cm well  circumscribed tan soft mass.  Microscopically, sections show uniform malignant cells with prominent nucleoli, abundant cytoplasm with  mitotic activity and tumor necrosis. Angiolymphatic invasion is present. The tumor is limited to  testis.  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 Immunohistochemical stains were performed and  the neoplastic cells show the following immunoprofile:  CD117 - negative  OCT4- positive  EMA- negative  CK AE1/AE3- negative  PLAP - positive  CD30 - virtually negative  CD20 - negative  AFP - negative  1 of 2  FINAL for [name] Anderson, [name] Anderson ([id number])  Microscopic Comment(continued)  HCG- negative  The control stained appropriately. The overall morphologic and immunohistochemical finding are diagnostic  for seminoma. The final resection margin is negative. (HCL:kh 09-15-13)  H. CATHERINE LI MD  Aortic Size Index= 4.7 /Body surface area is 2.16 meters squared. = 2.2  < 2.75 cm/m2 4% risk per year  2.75 to 4.25 8% risk per year  > 4.25 cm/m2 20% risk per year  Assessment / Plan:  1/Significant aortic insufficiency without overt symptoms of congestive heart failure but with evidence of left ventricular dilatation and relatively well preserved LV function. 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 The patient will complete 6 weeks of IV antibiotics January 16. I plan to see him back on January 24. We discussed proceeding with replacement of aortic valve and a sending  aorta February 4. When he is seen on January 24 we will plan repeat blood cultures to ensure negativity before proceeding with valve replacement February 4. Risks and options of surgery were discussed with the patient and his wife in detail. We also discussed the pros and cons of mechanical versus tissue valves. The patient prefers a tissue valve to avoid long-term Coumadin.  The goals risks and alternatives of the planned surgical procedure have been discussed with the patient in detail. The risks of the procedure including death, infection, heart block and need for permanent pacemaker stroke, myocardial infarction, bleeding, blood transfusion have all been discussed specifically. [name] Anderson is willing to proceed with the planned procedure.   [name] MD   [address]  [place] [address]  Office [phone]  [place]  05/02/2017 1:38 PM

## 2017-05-23 NOTE — Progress Notes (Signed)
[address]       [place] [address]             [phone]                    Rande R Iversen [place] Medical Record #[id number] Date of Birth: May 21, 1955  Referring: Lelon Perla, MD Primary Care: Antony Contras, MD Primary Cardiologist: No primary care provider on file.  Chief Complaint:    Chief Complaint  Patient presents with  . Follow-up    to discuss AVR replacement    History of Present Illness:    [name] Anderson 62 y.o. male is seen in the office  Today to finalize plans concerning aortic root replacement and aortic valve replacement.  Earlier this week the patient had blood cultures drawn the results are still pending, sed rate was elevated to 70 C-reactive protein normal at 2.9.  The patient does have chronic pain in both hips right slightly more than the left.  The patient has a known previous cardiac history of aortic insufficiency and mildly dilated ascending aorta.  The patient has been followed for several years with serial echocardiograms and CTA of the chest.  About 10  weeks ago echocardiogram was done suggesting increased  aortic insufficiency.  Repeat CTA of the chest suggested possible splenic infarcts.  TEE was performed because of these findings and suggested possible endocarditis, with increased aortic insufficiency and possible vegetation.  Although the patient did not present because of symptoms, in retrospect he had  felt more fatigued over the past several months, but denies any specific infections, denies fever and chills, has had no dental recent dental work, he has been noted to be anemic and recent upper GI endoscopy and colonoscopy were performed without specific causes of the anemia.   Following the TEE the patient was admitted to 3 blood cultures were obtained and grew Other than fatigue the patient has no significant symptoms of congestive heart failure, he was with a MRI of the brain in early November for 27-monthhistory of  dizziness.  MRI was negative for stroke March 12, 2017.  Patient has completed 6 weeks of IV ceFAZolin (ANCEF) IV on January 16.  .  Since discharge from the hospital patient has had a cardiac catheterization that showed no significant coronary artery disease.  He notes that overall he feels better denies fever chills, shortness of breath is improved.  Does have chronic pain in both hips.     Current Activity/ Functional Status:  Patient is independent with mobility/ambulation, transfers, ADL's, IADL's.   Zubrod Score: At the time of surgery this patient's most appropriate activity status/level should be described as: '[]'     0    Normal activity, no symptoms '[]'     1    Restricted in physical strenuous activity but ambulatory, able to do out light work '[]'     2    Ambulatory and capable of self care, unable to do work activities, up and about               >50 % of waking hours                              '[]'     3    Only limited self care, in bed greater than 50% of waking hours '[]'     4    Completely disabled,  no self care, confined to bed or chair '[]'     5    Moribund   Past Medical History:  Diagnosis Date  . Allergy   . Anemia   . Barrett's esophagus - short segment 03/13/2017  . Cancer ([place]) 09/11/13   testicular  . ED (erectile dysfunction)   . Endocarditis    Archie Endo 03/29/2017  . H/O aneurysm 03/05/2017  . High cholesterol   . History of basal cell carcinoma excision    02/2002   --  NOSE  &   2013  RIGHT EAR  . History of kidney stones   . Hyperlipidemia   . Mild obstructive sleep apnea    PER STUDY 01-18-2005--  NO CPAP OR MOUTH GUARD  . Moderate aortic valve insufficiency   . 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 Personal history of colonic polyps    TUBULAR ADENOMA--   . Seasonal allergies   . 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[place])   . Testicular mass    RIGHT    Past Surgical History:  Procedure Laterality Date  . COLONOSCOPY  01/24/06,  07/10/11  . EXTRACORPOREAL SHOCK WAVE LITHOTRIPSY    . HERNIA REPAIR    . MOHS SURGERY  02/2002  &  2013   TIP OF NOSE-///     RIGHT EAR  . ORCHIECTOMY Right 09/11/2013   Procedure:  RIGHT ORCHIECTOMY;  Surgeon: Jorja Loa, MD;  Location[facility];  Service: Urology;  Laterality: Right;  . RIGHT/LEFT HEART CATH AND CORONARY ANGIOGRAPHY N/A 04/18/2017   Procedure: RIGHT/LEFT HEART CATH AND CORONARY ANGIOGRAPHY;  Surgeon: Sherren Mocha, MD;  Location: [facility];  Service: Cardiovascular;  Laterality: N/A;  . SCROTAL EXPLORATION Right 09/11/2013   Procedure: RIGHT INGUINAL EXPLORATION;  Surgeon: Jorja Loa, MD;  Location: [facility];  Service: Urology;  Laterality: Right;  . TEE WITHOUT CARDIOVERSION N/A 03/29/2017   Procedure: TRANSESOPHAGEAL ECHOCARDIOGRAM (TEE);  Surgeon: Lelon Perla, MD;  Location: [facility] ENDOSCOPY;  Service: Cardiovascular;  Laterality: N/A;  . TRANSTHORACIC ECHOCARDIOGRAM  02-25-2013   DR CRENSHAW   NORMAL LVSF/  EF 55-60%/   GRADE 2 DIASTOLIC DYSFUNCTION/  ECCENTRIC MODERATE AORTIC INSUFFICIENCY WITHOUT STENOSIS/  MILD DILATED AORTIC ROOT/ TRIVIAL MR, TR,  & PR  . UMBILICAL HERNIA REPAIR  06-08-2009  . URETEROSCOPIC LASER LITHOTRIPSY STONE EXTRACTION  2007    Family History  Problem Relation Age of Onset  . Colon cancer Father   . Heart disease Paternal Grandmother     Social History   Socioeconomic History  . Marital status: Married    Spouse name: Coralyn Mark  . Number of children: 2  . Years of education: 40  . Highest education level: Not on file  Social Needs  . Financial resource strain: Not on file  . Food insecurity - worry: Not on file  . Food insecurity - inability: Not on file  . Transportation needs - medical: Not on file  . Transportation needs - non-medical: Not on file  Occupational History  . 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 Smoking status: Current Some Day Smoker     Types: Pipe  . Smokeless tobacco: Former Systems developer    Types: [place] date: 04/26/2011  . Tobacco comment: OCCASIONAL  PIPE SMOKER  (NEVER SMOKED CIGARETTES)  Substance and Sexual Activity  . Alcohol use: Yes    Comment: 03/29/2017 "might have a glass of wine  twice/year"  . Drug use: No  . Sexual activity: Yes  Other Topics Concern  . Not on file  Social History Narrative   Lives with wife Coralyn Mark   Caffeine use: coffee- 1 cup per day    Social History   Tobacco Use  Smoking Status Current Some Day Smoker  . Types: Pipe  Smokeless Tobacco Former Systems developer  . Types: Chew  . Quit date: 04/26/2011  Tobacco Comment   OCCASIONAL  PIPE SMOKER  (NEVER SMOKED CIGARETTES)    Social History   Substance and Sexual Activity  Alcohol Use Yes   Comment: 03/29/2017 "might have a glass of wine twice/year"     No Known Allergies  Current Outpatient Medications  Medication Sig Dispense Refill  . acetaminophen (TYLENOL) 500 MG tablet Take 1,000 mg by mouth every 6 (six) hours as needed (for pain.).     Marland Kitchen aluminum chloride (HYPERCARE) 20 % external solution Apply 1 application topically 3 (three) times daily as needed (for sweating.).    Marland Kitchen amoxicillin (AMOXIL) 500 MG capsule TAKE 4 TABLETS BY MOUTH 1 HOUR PRIOR TO DENTAL APPOINTMENT 4 capsule 1  . Ascorbic Acid (VITAMIN C) 1000 MG tablet Take 1,000 mg by mouth daily.    Marland Kitchen aspirin 81 MG chewable tablet Chew 81 mg by mouth daily.    Marland Kitchen atorvastatin (LIPITOR) 10 MG tablet Take 1 tablet (10 mg total) by mouth daily at 6 PM. 30 tablet 6  . betamethasone valerate (VALISONE) 0.1 % cream Apply 1 application topically 2 (two) times daily as needed (for skin irritation.).    Marland Kitchen cetirizine (ZYRTEC) 10 MG tablet Take 10 mg by mouth daily as needed for allergies.    . CVS TRIPLE MAGNESIUM COMPLEX PO Take 1 tablet by mouth 2 (two) times daily.    Marland Kitchen ECHINACEA PO Take 760 mg by mouth daily.    . Ferrous Sulfate (IRON) 325 (65 Fe) MG TABS Take 325 mg by mouth  daily.     . fluorometholone (FML) 0.1 % ophthalmic suspension Place 1 drop into both eyes 3 (three) times daily as needed (for irritated/itchy eyes.).    Marland Kitchen Glucosamine-Chondroitin (COSAMIN DS PO) Take 1 tablet by mouth 2 (two) times daily.    Marland Kitchen loratadine (CLARITIN) 10 MG tablet Take 10 mg by mouth daily as needed for allergies.    Marland Kitchen losartan (COZAAR) 50 MG tablet Take 1 tablet (50 mg total) by mouth daily. 90 tablet 3  . Naphazoline-Pheniramine (OPCON-A) 0.027-0.315 % SOLN Place 1-2 drops into both eyes 3 (three) times daily as needed (for allergy eyes.).    Marland Kitchen NON FORMULARY Take 2 capsules by mouth 2 (two) times daily. DoTerra (Microplex VMz) 2 Capsule twice daily    . NON FORMULARY Take 2 capsules by mouth 2 (two) times daily. XE0 Mega "Doterra"    . sildenafil (REVATIO) 20 MG tablet Take 20 mg by mouth daily as needed (for ED).     . SUPER B COMPLEX/C PO Take 1 tablet by mouth daily.    Marland Kitchen triamcinolone cream (KENALOG) 0.1 % Apply 1 application topically daily as needed (FOR DRY SKIN/ITCHY SKIN.). Apply to area after bath (DO NOT APPLY TO FACE)  1   No current facility-administered medications for this visit.     Pertinent items are noted in HPI.   Review of Systems:  Review of Systems  Constitutional: Positive for malaise/fatigue. Negative for chills, diaphoresis, fever and weight loss.  HENT: Negative.   Eyes: Negative.   Respiratory:  Negative.   Cardiovascular: Positive for palpitations. Negative for chest pain, orthopnea, claudication, leg swelling and PND.  Gastrointestinal: Negative.   Genitourinary: Negative.   Musculoskeletal: Negative.   Skin: Negative.   Neurological: Negative.  Negative for weakness.  Endo/Heme/Allergies: Negative.   Psychiatric/Behavioral: Negative.     Physical Exam: BP 127/62 (BP Location: Left Arm, Patient Position: Sitting, Cuff Size: Large)   Pulse 74   Resp 18   Ht '5\' 11"'  (1.803 m)   Wt 208 lb (94.3 kg)   SpO2 98% Comment: RA  BMI 29.01  kg/m   PHYSICAL EXAMINATION: General appearance: alert, cooperative, appears older than stated age and no distress Head: Normocephalic, without obvious abnormality, atraumatic Neck: no adenopathy, no carotid bruit, no JVD, supple, symmetrical, trachea midline and thyroid not enlarged, symmetric, no tenderness/mass/nodules Lymph nodes: Cervical, supraclavicular, and axillary nodes normal. Resp: clear to auscultation bilaterally Back: symmetric, no curvature. ROM normal. No CVA tenderness. Cardio: diastolic murmur: holodiastolic 3/6, decrescendo throughout the precordium GI: soft, non-tender; bowel sounds normal; no masses,  no organomegaly Extremities: extremities normal, atraumatic, no cyanosis or edema, Homans sign is negative, no sign of DVT and No evidence of peripheral embolization Neurologic: Grossly normal  Diagnostic Studies & Laboratory data:     Recent Radiology Findings:   No results found.   I have independently reviewed the above radiologic studies.  Recent Lab Findings: Lab Results  Component Value Date   WBC 6.9 04/18/2017   HGB 12.8 (L) 04/18/2017   HCT 40.3 04/18/2017   PLT 211 04/18/2017   GLUCOSE 101 (H) 04/18/2017   CHOL 165 03/30/2017   TRIG 91 03/30/2017   HDL 24 (L) 03/30/2017   LDLDIRECT 160.9 02/12/2013   LDLCALC 123 (H) 03/30/2017   ALT 13 (L) 03/29/2017   AST 19 03/29/2017   NA 138 04/18/2017   K 4.3 04/18/2017   CL 105 04/18/2017   CREATININE 1.03 04/18/2017   BUN 18 04/18/2017   CO2 25 04/18/2017   TSH 3.911 03/29/2017   INR 1.00 04/18/2017   Recent Lab Findings:  Micro:         Results for orders placed or performed during the hospital encounter of 03/29/17  Culture, blood (routine x 2) Status: Abnormal   Collection Time: 03/29/17 12:30 PM  Result Value Ref Range Status   Specimen Description BLOOD RIGHT ANTECUBITAL  Final   Special Requests   Final    BOTTLES DRAWN AEROBIC ONLY Blood Culture adequate volume   Culture Setup Time    Final    GRAM POSITIVE COCCI IN CLUSTERS  AEROBIC BOTTLE ONLY  CRITICAL VALUE NOTED. VALUE IS CONSISTENT WITH PREVIOUSLY REPORTED AND CALLED VALUE.    Culture (A)  Final    STAPHYLOCOCCUS SPECIES (COAGULASE NEGATIVE)  SUSCEPTIBILITIES PERFORMED ON PREVIOUS CULTURE WITHIN THE LAST 5 DAYS.    Report Status 04/02/2017 FINAL  Final  Culture, blood (routine x 2) Status: Abnormal   Collection Time: 03/29/17 12:32 PM  Result Value Ref Range Status   Specimen Description BLOOD LEFT HAND  Final   Special Requests   Final    BOTTLES DRAWN AEROBIC ONLY Blood Culture adequate volume   Culture Setup Time   Final    GRAM POSITIVE COCCI IN CLUSTERS  AEROBIC BOTTLE ONLY  CRITICAL RESULT CALLED TO, READ BACK BY AND VERIFIED WITH: A. JOHNSTON,PHARMD 2056 03/30/2017 T. TYSOR    Culture STAPHYLOCOCCUS SPECIES (COAGULASE NEGATIVE) (A)  Final   Report Status 04/02/2017 FINAL  Final   Organism ID,  Bacteria STAPHYLOCOCCUS SPECIES (COAGULASE NEGATIVE)  Final  Susceptibility   Staphylococcus species (coagulase negative) - MIC*    CIPROFLOXACIN <=0.5 SENSITIVE Sensitive     ERYTHROMYCIN >=8 RESISTANT Resistant     GENTAMICIN <=0.5 SENSITIVE Sensitive     OXACILLIN <=0.25 SENSITIVE Sensitive     TETRACYCLINE <=1 SENSITIVE Sensitive     VANCOMYCIN 1 SENSITIVE Sensitive     TRIMETH/SULFA <=10 SENSITIVE Sensitive     CLINDAMYCIN <=0.25 SENSITIVE Sensitive     RIFAMPIN <=0.5 SENSITIVE Sensitive     Inducible Clindamycin NEGATIVE Sensitive    * STAPHYLOCOCCUS SPECIES (COAGULASE NEGATIVE)  Blood Culture ID Panel (Reflexed) Status: Abnormal   Collection Time: 03/29/17 12:32 PM  Result Value Ref Range Status   Enterococcus species NOT DETECTED NOT DETECTED Final   Listeria monocytogenes NOT DETECTED NOT DETECTED Final   Staphylococcus species DETECTED (A) NOT DETECTED Final    Comment: Methicillin (oxacillin) susceptible coagulase negative staphylococcus. Possible blood culture contaminant (unless isolated from  more than one blood culture draw or clinical case suggests pathogenicity). No antibiotic treatment is indicated for blood  culture contaminants.  CRITICAL RESULT CALLED TO, READ BACK BY AND VERIFIED WITH:  A. JOHNSTON,PHARMD 2056 03/30/2017 T. TYSOR    Staphylococcus aureus NOT DETECTED NOT DETECTED Final   Methicillin resistance NOT DETECTED NOT DETECTED Final   Streptococcus species NOT DETECTED NOT DETECTED Final   Streptococcus agalactiae NOT DETECTED NOT DETECTED Final   Streptococcus pneumoniae NOT DETECTED NOT DETECTED Final   Streptococcus pyogenes NOT DETECTED NOT DETECTED Final   Acinetobacter baumannii NOT DETECTED NOT DETECTED Final   Enterobacteriaceae species NOT DETECTED NOT DETECTED Final   Enterobacter cloacae complex NOT DETECTED NOT DETECTED Final   Escherichia coli NOT DETECTED NOT DETECTED Final   Klebsiella oxytoca NOT DETECTED NOT DETECTED Final   Klebsiella pneumoniae NOT DETECTED NOT DETECTED Final   Proteus species NOT DETECTED NOT DETECTED Final   Serratia marcescens NOT DETECTED NOT DETECTED Final   Haemophilus influenzae NOT DETECTED NOT DETECTED Final   Neisseria meningitidis NOT DETECTED NOT DETECTED Final   Pseudomonas aeruginosa NOT DETECTED NOT DETECTED Final   Candida albicans NOT DETECTED NOT DETECTED Final   Candida glabrata NOT DETECTED NOT DETECTED Final   Candida krusei NOT DETECTED NOT DETECTED Final   Candida parapsilosis NOT DETECTED NOT DETECTED Final   Candida tropicalis NOT DETECTED NOT DETECTED Final  Culture, blood (routine x 2) Status: Abnormal   Collection Time: 03/29/17 12:35 PM  Result Value Ref Range Status   Specimen Description BLOOD RIGHT HAND  Final   Special Requests   Final    BOTTLES DRAWN AEROBIC ONLY Blood Culture adequate volume   Culture Setup Time   Final    GRAM POSITIVE COCCI IN CLUSTERS  AEROBIC BOTTLE ONLY  CRITICAL VALUE NOTED. VALUE IS CONSISTENT WITH PREVIOUSLY REPORTED AND CALLED VALUE.    Culture (A)  Final      STAPHYLOCOCCUS SPECIES (COAGULASE NEGATIVE)  SUSCEPTIBILITIES PERFORMED ON PREVIOUS CULTURE WITHIN THE LAST 5 DAYS.    Report Status 04/02/2017 FINAL  Final  Culture, blood (routine x 2) Status: Abnormal   Collection Time: 03/29/17 9:17 PM  Result Value Ref Range Status   Specimen Description BLOOD LEFT ANTECUBITAL  Final   Special Requests Blood Culture adequate volume IN PEDIATRIC BOTTLE  Final   Culture Setup Time   Final    GRAM POSITIVE COCCI  IN PEDIATRIC BOTTLE  CRITICAL VALUE NOTED. VALUE IS CONSISTENT WITH PREVIOUSLY REPORTED AND CALLED VALUE.  Culture (A)  Final    STAPHYLOCOCCUS SPECIES (COAGULASE NEGATIVE)  SUSCEPTIBILITIES PERFORMED ON PREVIOUS CULTURE WITHIN THE LAST 5 DAYS.    Report Status 04/02/2017 FINAL  Final  Culture, blood (routine x 2) Status: Abnormal   Collection Time: 03/29/17 9:21 PM  Result Value Ref Range Status   Specimen Description BLOOD LEFT HAND  Final   Special Requests Blood Culture adequate volume IN PEDIATRIC BOTTLE  Final   Culture Setup Time   Final    GRAM POSITIVE COCCI IN CLUSTERS  IN PEDIATRIC BOTTLE  CRITICAL VALUE NOTED. VALUE IS CONSISTENT WITH PREVIOUSLY REPORTED AND CALLED VALUE.    Culture (A)  Final    STAPHYLOCOCCUS SPECIES (COAGULASE NEGATIVE)  SUSCEPTIBILITIES PERFORMED ON PREVIOUS CULTURE WITHIN THE LAST 5 DAYS.    Report Status 04/02/2017 FINAL  Final  Culture, blood (routine x 2) Status: None   Collection Time: 04/01/17 5:30 AM  Result Value Ref Range Status   Specimen Description BLOOD RIGHT ANTECUBITAL  Final   Special Requests   Final    BOTTLES DRAWN AEROBIC AND ANAEROBIC Blood Culture adequate volume   Culture NO GROWTH 5 DAYS  Final   Report Status 04/06/2017 FINAL  Final  Culture, blood (routine x 2) Status: None   Collection Time: 04/01/17 5:35 AM  Result Value Ref Range Status   Specimen Description BLOOD LEFT ANTECUBITAL  Final   Special Requests   Final    BOTTLES DRAWN AEROBIC AND ANAEROBIC Blood  Culture adequate volume   Culture NO GROWTH 5 DAYS  Final   Report Status 04/06/2017 FINAL  Final   Recent Radiology Findings:  Mr Virgel [place] Contrast  Result Date: 03/12/2017  CLINICAL DATA: Dizziness for 3 months. Hypertension. EXAM: MRA HEAD WITHOUT CONTRAST TECHNIQUE: Angiographic images of the Circle of Willis were obtained using MRA technique without intravenous contrast. COMPARISON: None. FINDINGS: Anterior circulation: Slightly smaller right ICA in the setting of relative right A1 hypoplasia. Robust anterior communicating artery. Vessels are smooth and widely patent. Negative for aneurysm. Posterior circulation: Left dominant vertebral artery. There is symmetric loss of signal in the proximal V4 segments and picas due to partial horizontal course. There is antegrade flow in both vertebral arteries on recent carotid Doppler. The vessels are smooth and widely patent. A small left posterior communicating artery is present. Negative for aneurysm. IMPRESSION: Normal intracranial MRA. Electronically Signed By: Monte Fantasia M.D. On: 03/12/2017 09:00  Ct Angio Chest Aorta W &/or Wo Contrast  Result Date: 03/19/2017  CLINICAL DATA: 62 year old male with a history of thoracic aortic aneurysm EXAM: CT ANGIOGRAPHY CHEST WITH CONTRAST TECHNIQUE: Multidetector CT imaging of the chest was performed using the standard protocol during bolus administration of intravenous contrast. Multiplanar CT image reconstructions and MIPs were obtained to evaluate the vascular anatomy. CONTRAST: 168m ISOVUE-370 IOPAMIDOL (ISOVUE-370) INJECTION 76% COMPARISON: Prior CTA of the chest 03/29/2016 FINDINGS: Cardiovascular: Stable mild aneurysmal dilatation of the aortic root. Precise measurements are challenging giving artifact related to cardiac motion, however the maximal diameter does not exceed 4.7 cm as previously measured. In fact, I suspect the true measurement to be closer to 4.4 cm. The tubular portion of the ascending  thoracic aorta remains stable at 3.8 cm. The transverse and descending thoracic aorta are normal in caliber. No significant aortic plaque. Mild left ventricular dilatation. No thrombus visualized within the cardiac chambers or left atrial appendage. Mediastinum/Nodes: Unremarkable CT appearance of the thyroid gland. No suspicious mediastinal or hilar adenopathy. No soft tissue mediastinal mass. The  thoracic esophagus is unremarkable. Lungs/Pleura: Lungs are clear. No pleural effusion or pneumothorax. Upper Abdomen: Interval development of multiple wedge-shaped regions of low attenuation in the superior aspect of the spleen as well as an approximately 6.6 x 5.2 cm low-attenuation region in the inferior aspect of the spleen. These findings are new compared to prior imaging. Musculoskeletal: No chest wall abnormality. No acute or significant osseous findings. Review of the MIP images confirms the above findings. IMPRESSION: 1. Stable mild aneurysmal dilatation of the aortic root which is no larger than 4.7 cm. I suspect the 4.7 cm measurement may be slightly exaggerated by cardiac motion artifact which is been present across multiple prior studies. The true measurement of the aortic root is likely closer to 4.4 cm. Consider cardiac gated CTA of the next annual follow-up evaluation. 2. Interval development of multiple wedge-shaped regions of low attenuation in the superior aspect of the spleen as well as a new 6.6 cm low-attenuation region in the inferior aspect of the spleen. 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 There is no significant or irregular atherosclerotic plaque in the thoracic, or upper abdominal aorta to serve as a donor site for emboli. Does the patient have a clinical history of atrial fibrillation? Has the patient experienced episodes of left upper quadrant pain over the past several weeks or months? 3. Left ventricular dilatation. Aortic aneurysm NOS (ICD10-I71.9). Signed,  Criselda Peaches, MD [facility] Radiology Electronically Signed By: Jacqulynn Cadet M.D. On: 03/19/2017 10:07  I have independently reviewed the above radiology studies and reviewed the findings with the patient.  CATH: Procedures  RIGHT/LEFT HEART CATH AND CORONARY ANGIOGRAPHY  Conclusion  1. Widely patent coronary arteries (right dominant)  2. Normal right and left heart pressures  Coronary Findings  Diagnostic  Dominance: Right  Left Main  Vessel is angiographically normal.  Left Anterior Descending  Vessel is angiographically normal.  First Diagonal Branch  The LAD is patent to the LV apex without obstructive disease.  Left Circumflex  Large vessel, angiographically normal.  Second Obtuse Marginal Branch  Vessel is angiographically normal.  Right Coronary Artery  Vessel is angiographically normal. Large, dominant vessel, angiographically normal  Hemodynamic Data    Most Recent Value  Fick Cardiac Output 6.78 L/min  Fick Cardiac Output Index 3.32 (L/min)/BSA  RA A Wave 6 mmHg  RA V Wave 4 mmHg  RA Mean 5 mmHg  RV Systolic Pressure 33 mmHg  RV Diastolic Pressure 3 mmHg  RV EDP 9 mmHg  PA Systolic Pressure 29 mmHg  PA Diastolic Pressure 12 mmHg  PA Mean 19 mmHg  PW A Wave 12 mmHg  PW V Wave 12 mmHg  PW Mean 9 mmHg  AO Systolic Pressure 741 mmHg  AO Diastolic Pressure 54 mmHg  AO Mean 80 mmHg  LV Systolic Pressure 287 mmHg  LV Diastolic Pressure 9 mmHg  LV EDP 15 mmHg  Arterial Occlusion Pressure Extended Systolic Pressure 867 mmHg  Arterial Occlusion Pressure Extended Diastolic Pressure 60 mmHg  Arterial Occlusion Pressure Extended Mean Pressure 82 mmHg  Left Ventricular Apex Extended Systolic Pressure 672 mmHg  Left Ventricular Apex Extended Diastolic Pressure 8 mmHg  Left Ventricular Apex Extended EDP Pressure 14 mmHg  QP/QS 0.96  TPVR Index 5.96 HRUI  TSVR Index 24.09 HRUI  PVR SVR Ratio 0.14  TPVR/TSVR  Ratio   ECHO:  Result status: Edited Result - FINAL  [facility]  [address]  [place], [place] [address]  [phone]  -------------------------------------------------------------------  Echocardiography  (Report amended )  Patient: Adolf, [place]  MR #: [id number]  Study Date: 03/19/2017  Gender: M  Age: 77  Height: 180.3 cm  Weight: 89.8 kg  BSA: 2.14 m^2  Pt. Status:  Room:  SONOGRAPHER Cindy Hazy, RDCS  ATTENDING Ena Dawley, M.D.  PERFORMING [facility], Outpatient  Joanna Hews [id number]  [place] Guardian [id number]  cc:  -------------------------------------------------------------------  LV EF: 50% - 55%  -------------------------------------------------------------------  Indications: I35.9 Aortic Valve Disorder.  -------------------------------------------------------------------  History: PMH: Acquired from the patient and from the patient&'s  chart. PMH: Obstructive Sleep Apnea-CPAP. Dilated Aortic Root.  Risk factors: Former tobacco use. Dyslipidemia.  -------------------------------------------------------------------  Study Conclusions  - Left ventricle: The cavity size was severely dilated. Wall  thickness was normal. Systolic function was normal. The estimated  ejection fraction was in the range of 50% to 55%. Wall motion was  normal; there were no regional wall motion abnormalities. Doppler  parameters are consistent with abnormal left ventricular  relaxation (grade 1 diastolic dysfunction). There was no evidence  of elevated ventricular filling pressure by Doppler parameters.  - Aortic valve: There was severe regurgitation.  - Aortic root: The aortic root was moderately dilated measuring 46  mm.  - Ascending aorta: The ascending aorta was upper normal size  measuring 40 mm.  - Mitral valve: There was mild regurgitation.  - Left atrium: The atrium was mildly dilated.  - Right ventricle: The cavity size was normal. Wall thickness  was  normal. Systolic function was normal.  - Right atrium: The atrium was normal in size.  - Inferior vena cava: The vessel was normal in size.  - Pericardium, extracardiac: There was no pericardial effusion.  Impressions:  - When compared to the prior study from 04/13/2016 there are  significant changes.  LV size has increased from normal to moderately to severely  dilated.  LVEF has decreased from 60-65% to 50-55%.  Aortic root remains moderately dilated with now severe aortic  regurgitation.  There is partially flail or redundant chordae of the anterior  mitral valve leaflet with no significant mitral regurgitation.  CT surgery consult should be considered.  -------------------------------------------------------------------  Study data: Study status: Routine. Procedure: The patient  reported no pain pre or post test. Transthoracic echocardiography  for left ventricular function evaluation, for right ventricular  function evaluation, and for assessment of valvular function. Image  quality was adequate. Study completion: There were no  complications. Echocardiography. M-mode, complete 2D,  spectral Doppler, and color Doppler. Birthdate: Patient  birthdate: 26-Mar-1956. Age: Patient is 62 yr old. Sex: Gender:  male. BMI: 27.6 kg/m^2. Blood pressure: 125/64 Patient  status: Outpatient. Study date: Study date: 03/19/2017. Study  time: 08:42 AM. Location: [facility]  -------------------------------------------------------------------  -------------------------------------------------------------------  Left ventricle: The cavity size was severely dilated. Wall  thickness was normal. Systolic function was normal. The estimated  ejection fraction was in the range of 50% to 55%. Wall motion was  normal; there were no regional wall motion abnormalities. Doppler  parameters are consistent with abnormal left ventricular relaxation  (grade 1 diastolic dysfunction). There was no  evidence of elevated  ventricular filling pressure by Doppler parameters.  -------------------------------------------------------------------  Aortic valve: Trileaflet; mildly thickened, mildly calcified  leaflets. Mobility was not restricted. Doppler: Transvalvular  velocity was within the normal range. There was no stenosis. There  was severe regurgitation. VTI ratio of LVOT to aortic valve:  0.73. Valve area (VTI):  4.47 cm^2. Indexed valve area (VTI): 2.09  cm^2/m^2. Peak velocity ratio of LVOT to aortic valve: 0.68. Valve  area (Vmax): 4.19 cm^2. Indexed valve area (Vmax): 1.96 cm^2/m^2.  Mean velocity ratio of LVOT to aortic valve: 0.66. Valve area  (Vmean): 4.05 cm^2. Indexed valve area (Vmean): 1.89 cm^2/m^2.  Mean gradient (S): 7 mm Hg. Peak gradient (S): 12 mm Hg.  -------------------------------------------------------------------  Aorta: Aortic root: The aortic root was moderately dilated  measuring 46 mm.  Ascending aorta: The ascending aorta was upper normal size  measuring 40 mm.  -------------------------------------------------------------------  Mitral valve: Structurally normal valve. Mobility was not  restricted. Doppler: Transvalvular velocity was within the normal  range. There was no evidence for stenosis. There was mild  regurgitation.  -------------------------------------------------------------------  Left atrium: The atrium was mildly dilated.  -------------------------------------------------------------------  Right ventricle: The cavity size was normal. Wall thickness was  normal. Systolic function was normal.  -------------------------------------------------------------------  Pulmonic valve: Structurally normal valve. Cusp separation was  normal. Doppler: Transvalvular velocity was within the normal  range. There was no evidence for stenosis. There was trivial  regurgitation.  -------------------------------------------------------------------    Tricuspid valve: Structurally normal valve. Doppler:  Transvalvular velocity was within the normal range. There was no  regurgitation.  -------------------------------------------------------------------  Pulmonary artery: The main pulmonary artery was normal-sized.  Systolic pressure could not be accurately estimated.  -------------------------------------------------------------------  Right atrium: The atrium was normal in size.  -------------------------------------------------------------------  Pericardium: There was no pericardial effusion.  -------------------------------------------------------------------  Systemic veins:  Inferior vena cava: The vessel was normal in size.  -------------------------------------------------------------------  Measurements  Left ventricle Value Reference  LV ID, ED, PLAX chordal (H) 64 mm 43 - 52  LV ID, ES, PLAX chordal (H) 46.1 mm 23 - 38  LV fx shortening, PLAX chordal (L) 28 % >=29  LV PW thickness, ED 10 mm ---------  IVS/LV PW ratio, ED 0.9 <=1.3  Stroke volume, 2D 161 ml ---------  Stroke volume/bsa, 2D 75 ml/m^2 ---------  LV e&', lateral 5.59 cm/s ---------  LV E/e&', lateral 10.41 ---------  LV e&', medial 8.77 cm/s ---------  LV E/e&', medial 6.64 ---------  LV e&', average 7.18 cm/s ---------  LV E/e&', average 8.11 ---------  Ventricular septum Value Reference  IVS thickness, ED 9.01 mm ---------  LVOT Value Reference  LVOT ID, S 28 mm ---------  LVOT area 6.16 cm^2 ---------  LVOT ID 28 mm ---------  LVOT peak velocity, S 119 cm/s ---------  LVOT mean velocity, S 85.4 cm/s ---------  LVOT VTI, S 26.1 cm ---------  LVOT peak gradient, S 6 mm Hg ---------  Stroke volume (SV), LVOT DP 160.7 ml ---------  Stroke index (SV/bsa), LVOT DP 75.2 ml/m^2 ---------  Aortic valve Value Reference  Aortic valve peak velocity, S 175 cm/s ---------  Aortic valve mean velocity, S 130 cm/s ---------  Aortic valve VTI, S 36 cm  ---------  Aortic mean gradient, S 7 mm Hg ---------  Aortic peak gradient, S 12 mm Hg ---------  VTI ratio, LVOT/AV 0.73 ---------  Aortic valve area, VTI 4.47 cm^2 ---------  Aortic valve area/bsa, VTI 2.09 cm^2/m^2 ---------  Velocity ratio, peak, LVOT/AV 0.68 ---------  Aortic valve area, peak velocity 4.19 cm^2 ---------  Aortic valve area/bsa, peak 1.96 cm^2/m^2 ---------  velocity  Velocity ratio, mean, LVOT/AV 0.66 ---------  Aortic valve area, mean velocity 4.05 cm^2 ---------  Aortic valve area/bsa, mean 1.89 cm^2/m^2 ---------  velocity  Aortic regurg pressure half-time 356 ms ---------  Aorta Value Reference  Aortic root ID, ED 46 mm ---------  Ascending aorta ID, A-P, S 40 mm ---------  Left atrium Value Reference  LA ID, A-P, ES 40 mm ---------  LA ID/bsa, A-P 1.87 cm/m^2 <=2.2  LA volume, S 60 ml ---------  LA volume/bsa, S 28.1 ml/m^2 ---------  LA volume, ES, 1-p A4C 54 ml ---------  LA volume/bsa, ES, 1-p A4C 25.3 ml/m^2 ---------  LA volume, ES, 1-p A2C 62 ml ---------  LA volume/bsa, ES, 1-p A2C 29 ml/m^2 ---------  Mitral valve Value Reference  Mitral E-wave peak velocity 58.2 cm/s ---------  Mitral A-wave peak velocity 76 cm/s ---------  Mitral deceleration time (H) 271 ms 150 - 230  Mitral E/A ratio, peak 0.8 ---------  Right ventricle Value Reference  RV s&', lateral, S 20.8 cm/s ---------  Legend:  (L) and (H) mark values outside specified reference range.  -------------------------------------------------------------------  Lance Morin, M.D.  2018-11-20T17:55:38  TEE: *[facility]*  [address], [place] [address]  [phone]  -------------------------------------------------------------------  Transesophageal Echocardiography  Patient: Nikesh, Teschner  MR #: [id number]  Study Date: 03/29/2017  Gender: M  Age: 19  Height: 180.3 cm  Weight: 88.2 kg  BSA: 2.12 m^2  Pt. Status:  Room:   ADMITTING Kirk Ruths  ATTENDING Kirk Ruths  ORDERING Kirk Ruths  PERFORMING Kirk Ruths  REFERRING Kirk Ruths  SONOGRAPHER Jannett Celestine, RDCS  cc:  -------------------------------------------------------------------  LV EF: 55% - 60%  -------------------------------------------------------------------  History: PMH: Aortic Valve Disease. Risk factors:  Dyslipidemia.  -------------------------------------------------------------------  Study Conclusions  - Left ventricle: Systolic function was normal. The estimated  ejection fraction was in the range of 55% to 60%. Wall motion was  normal; there were no regional wall motion abnormalities.  - Aortic valve: There was a small vegetation on the left  ventricular aspect of the left coronary cusp. There was severe  regurgitation.  - Mitral valve: No evidence of vegetation. There was mild  regurgitation.  - Right atrium: No evidence of thrombus in the atrial cavity or  appendage.  - Tricuspid valve: No evidence of vegetation.  - Pulmonic valve: No evidence of vegetation.  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  -------------------------------------------------------------------  Study data: Study status: Routine. Consent: The risks,  benefits, and alternatives to the procedure were explained to the  patient and informed consent was obtained. Procedure: The patient  reported no pain pre or post test. Initial setup. The patient was  brought to the laboratory. Surface ECG leads were monitored.  Sedation. Sedation was administered by cardiology staff.  Transesophageal echocardiography. A transesophageal probe was  inserted by the attending cardiologist. Image  quality was adequate.  Study completion: The patient tolerated the procedure well. There  were no complications. Diagnostic transesophageal  echocardiography. 2D and color Doppler. Birthdate: Patient  birthdate: 18-Apr-1956. Age: Patient is 62 yr old. Sex: Gender:  male. BMI: 27.1 kg/m^2. Blood pressure: 117/50 Patient  status: Outpatient. Study date: Study date: 03/29/2017. Study  time: 10:58 AM. Location: Endoscopy.  -------------------------------------------------------------------  -------------------------------------------------------------------  Left ventricle: Systolic function was normal. The estimated  ejection fraction was in the range of 55% to 60%. Wall motion was  normal; there were no regional wall motion abnormalities.  -------------------------------------------------------------------  Aortic valve: Trileaflet; mildly thickened leaflets. There was a  small vegetation on the left ventricular aspect of the left  coronary cusp. Doppler: There was severe regurgitation.  -------------------------------------------------------------------  Aorta: Descending aorta: The descending aorta had mild diffuse  disease.  -------------------------------------------------------------------  Mitral valve: Mildly thickened leaflets . Leaflet separation was  normal. No evidence of vegetation. Doppler: There was mild  regurgitation.  -------------------------------------------------------------------  Left atrium: The atrium was normal in size.  -------------------------------------------------------------------  Right ventricle: The cavity size was normal. Systolic function was  normal.  -------------------------------------------------------------------  Pulmonic valve: Structurally normal valve. Cusp separation was  normal. No evidence of vegetation.  -------------------------------------------------------------------  Tricuspid valve: Structurally normal valve. Leaflet separation   was normal. No evidence of vegetation. Doppler: There was mild  regurgitation.  -------------------------------------------------------------------  Right atrium: The atrium was normal in size. No evidence of  thrombus in the atrial cavity or appendage.  -------------------------------------------------------------------  Pericardium: There was no pericardial effusion.  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  - RESECTION MARGINS, NEGATIVE FOR MALIGNANCY.  - PLEASE SEE ONCOLOGY TEMPLATE FOR DETAILS.  Microscopic Comment  ONCOLOGY TABLE - TESTIS  1. Specimen and laterality: Right testis  2. Tumor focality: Unifocal  3. Macroscopic extent of tumor: Limited to testicular parenchyma.  4. Maximum tumor size (cm): 6.3 cm  5. Histologic type: Seminoma  6. Microscopic tumor extension: Limited to testis  7. Spermatic cord and surgical margins: Negative  8. Lymph-Vascular invasion: Present  9. Intratubular germ cell neoplasia: Present  10. Lymph nodes: # examined: N/A; # positive: N/A  11. TNM code: pT2, pNX  12. Serum tumor markers: See patient's medical record  13. Comment: The cut surfaces of the tumor show a 6.3 cm well circumscribed tan soft mass.  Microscopically, sections show uniform malignant cells with prominent nucleoli, abundant cytoplasm with  mitotic activity and tumor necrosis. Angiolymphatic invasion is present. The tumor is limited to testis.  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 Immunohistochemical stains were performed and  the neoplastic cells show the following immunoprofile:  CD117 - negative  OCT4- positive  EMA- negative  CK AE1/AE3- negative  PLAP - positive  CD30 - virtually negative  CD20 - negative  AFP - negative  1 of 2  FINAL for  JAYR, LUPERCIO ([id number])  Microscopic Comment(continued)  HCG- negative  The control stained appropriately. The overall morphologic and immunohistochemical finding are diagnostic  for seminoma. The final resection margin is negative. (HCL:kh 09-15-13)  H. CATHERINE LI MD  Aortic Size Index= 4.7 /Body surface area is 2.16 meters squared. = 2.2  < 2.75 cm/m2 4% risk per year  2.75 to 4.25 8% risk per year  > 4.25 cm/m2 20% risk per year     Assessment / Plan:  1/Significant aortic insufficiency without overt symptoms of congestive heart failure but with evidence of left ventricular dilatation and relatively well preserved LV function.  With culture proven endocarditis, and possible embolic phenomenon to the spleen.  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 The patient has completed 6 weeks of IV antibiotics on January 16.  Follow blood cultures are still pending drawn several days ago.  At the suggestion of infectious disease we have arranged for bilateral hip MRIs prior to her surgery.  Of again reviewed with the patient and his wife the risks and options of surgery . We also discussed the pros and cons of mechanical versus tissue valves. The patient prefers a tissue valve to avoid long-term Coumadin.    The goals risks and alternatives of the planned surgical procedure have been discussed with the patient in detail. The risks of the procedure including death, infection, heart block and need for permanent pacemaker stroke, myocardial infarction, bleeding, blood transfusion have all been discussed specifically. EVERADO PILLSBURY is willing to proceed with the planned procedure.  Tentatively plan for February 4          [name] MD      [address] [place] [address] Office [phone]   Beeper [phone]  05/23/2017 10:12 AM

## 2017-05-23 NOTE — Patient Instructions (Signed)
Stop ASA Stop Losartan Feb 2     Aortic Valve Replacement, Care After Refer to this sheet in the next few weeks. These instructions provide you with information about caring for yourself after your procedure. Your health care provider may also give you more specific instructions. Your treatment has been planned according to current medical practices, but problems sometimes occur. Call your health care provider if you have any problems or questions after your procedure. What can I expect after the procedure? After the procedure, it is common to have:  Pain around your incision area.  A small amount of blood or clear fluid coming from your incision.  Follow these instructions at home: Eating and drinking   Follow instructions from your health care provider about eating or drinking restrictions. ? Limit alcohol intake to no more than 1 drink per day for nonpregnant women and 2 drinks per day for men. One drink equals 12 oz of beer, 5 oz of wine, or 1 oz of hard liquor. ? Limit how much caffeine you drink. Caffeine can affect your heart's rate and rhythm.  Drink enough fluid to keep your urine clear or pale yellow.  Eat a heart-healthy diet. This should include plenty of fresh fruits and vegetables. If you eat meat, it should be lean cuts. Avoid foods that are: ? High in salt, saturated fat, or sugar. ? Canned or highly processed. ? Fried. Activity  Return to your normal activities as told by your health care provider. Ask your health care provider what activities are safe for you.  Exercise regularly once you have recovered, as told by your health care provider.  Avoid sitting for more than 2 hours at a time without moving. Get up and move around at least once every 1-2 hours. This helps to prevent blood clots in the legs.  Do not lift anything that is heavier than 10 lb (4.5 kg) until your health care provider approves.  Avoid pushing or pulling things with your arms until your  health care provider approves. This includes pulling on handrails to help you climb stairs. Incision care   Follow instructions from your health care provider about how to take care of your incision. Make sure you: ? Wash your hands with soap and water before you change your bandage (dressing). If soap and water are not available, use hand sanitizer. ? Change your dressing as told by your health care provider. ? Leave stitches (sutures), skin glue, or adhesive strips in place. These skin closures may need to stay in place for 2 weeks or longer. If adhesive strip edges start to loosen and curl up, you may trim the loose edges. Do not remove adhesive strips completely unless your health care provider tells you to do that.  Check your incision area every day for signs of infection. Check for: ? More redness, swelling, or pain. ? More fluid or blood. ? Warmth. ? Pus or a bad smell. Medicines  Take over-the-counter and prescription medicines only as told by your health care provider.  If you were prescribed an antibiotic medicine, take it as told by your health care provider. Do not stop taking the antibiotic even if you start to feel better. Travel  Avoid airplane travel for as long as told by your health care provider.  When you travel, bring a list of your medicines and a record of your medical history with you. Carry your medicines with you. Driving  Ask your health care provider when it is safe  for you to drive. Do not drive until your health care provider approves.  Do not drive or operate heavy machinery while taking prescription pain medicine. Lifestyle   Do not use any tobacco products, such as cigarettes, chewing tobacco, or e-cigarettes. If you need help quitting, ask your health care provider.  Resume sexual activity as told by your health care provider. Do not use medicines for erectile dysfunction unless your health care provider approves, if this applies.  Work with your  health care provider to keep your blood pressure and cholesterol under control, and to manage any other heart conditions that you have.  Maintain a healthy weight. General instructions  Do not take baths, swim, or use a hot tub until your health care provider approves.  Do not strain to have a bowel movement.  Avoid crossing your legs while sitting down.  Check your temperature every day for a fever. A fever may be a sign of infection.  If you are a woman and you plan to become pregnant, talk with your health care provider before you become pregnant.  Wear compression stockings if your health care provider instructs you to do this. These stockings help to prevent blood clots and reduce swelling in your legs.  Tell all health care providers who care for you that you have an artificial (prosthetic) aortic valve. If you have or have had heart disease or endocarditis, tell all health care providers about these conditions as well.  Keep all follow-up visits as told by your health care provider. This is important. Contact a health care provider if:  You develop a skin rash.  You experience sudden, unexplained changes in your weight.  You have more redness, swelling, or pain around your incision.  You have more fluid or blood coming from your incision.  Your incision feels warm to the touch.  You have pus or a bad smell coming from your incision.  You have a fever. Get help right away if:  You develop chest pain that is different from the pain coming from your incision.  You develop shortness of breath or difficulty breathing.  You start to feel light-headed. These symptoms may represent a serious problem that is an emergency. Do not wait to see if the symptoms will go away. Get medical help right away. Call your local emergency services (911 in the [place] Do not drive yourself to the hospital. This information is not intended to replace advice given to you by your health care  provider. Make sure you discuss any questions you have with your health care provider. Document Released: 11/02/2004 Document Revised: 09/22/2015 Document Reviewed: 03/20/2015 Elsevier Interactive Patient Education  2017 [employer].  Aortic Valve Replacement Aortic valve replacement is surgical replacement of an aortic valve that cannot be repaired. This procedure may be done to replace:  A narrow aortic valve (aortic valve stenosis).  A deformed aortic valve (bicuspid aortic valve).  An aortic valve that does not close all the way (aortic insufficiency).  The aortic valve is replaced with artificial (prosthetic) valve. Three types of prosthetic valves are available:  Mechanical valves made entirely from man-made materials.  Donor valves from human donors. These are only used in special situations.  Biological valves made from animal tissues.  The type of prosthetic valve used will be determined based on various factors, including your age, your lifestyle, and other medical conditions you have. This procedure is done using an open surgical technique, meaning that a large incision will be made  in your chest over your heart. Tell a health care provider about:  Any allergies you have.  All medicines you are taking, including vitamins, herbs, eye drops, creams, and over-the-counter medicines.  Any problems you or family members have had with anesthetic medicines.  Any blood disorders you have.  Any surgeries you have had.  Any medical conditions you have.  Whether you are pregnant or may be pregnant. What are the risks? Generally, this is a safe procedure. However, problems may occur, including:  Infection.  Bleeding.  Allergic reactions to medicines.  Damage to other structures or organs.  Blood clotting caused by the prosthetic valve.  Failure of the prosthetic valve.  What happens before the procedure?  Ask your health care provider about: ? Changing or  stopping your regular medicines. This is especially important if you are taking diabetes medicines or blood thinners. ? Taking medicines such as aspirin and ibuprofen. These medicines can thin your blood. Do not take these medicines before your procedure if your health care provider instructs you not to.  Follow instructions from your health care provider about eating or drinking restrictions.  You may be given antibiotic medicine to help prevent infection.  You may have tests, such as: ? Echocardiogram. ? Electrocardiogram (ECG). ? Blood tests. ? Urine tests.  Plan to have someone take you home when you leave the hospital.  Plan to have someone with you for at least 24 hours after you leave the hospital. What happens during the procedure?  To reduce your risk of infection: ? Your health care team will wash or sanitize their hands. ? Your skin will be washed with soap.  An IV tube will be inserted into one of your veins.  You will be given one or more of the following: ? A medicine to help you relax (sedative). ? A medicine to make you fall asleep (general anesthetic).  You will be placed on a heart-lung bypass machine. This machine provides oxygen to your blood while your heart is undergoing surgery.  An incision will be made in your chest, over your heart.  Your damaged aortic valve will be removed.  A prosthetic valve will be sewn into your heart.  Your incision will be closed with stitches (sutures), skin glue, or adhesive tape.  A bandage (dressing) will be placed over your incision. The procedure may vary among health care providers and hospitals. What happens after the procedure?  Your blood pressure, heart rate, breathing rate, and blood oxygen level will be monitored often until the medicines you were given have worn off.  Do not drive until your health care provider approves. This information is not intended to replace advice given to you by your health care  provider. Make sure you discuss any questions you have with your health care provider. Document Released: 09/05/2004 Document Revised: 09/22/2015 Document Reviewed: 03/20/2015 Elsevier Interactive Patient Education  2017 [employer].  Thoracic Aortic Aneurysm An aneurysm is a bulge in an artery. It happens when blood pushes up against a weakened or damaged artery wall. A thoracic aortic aneurysm is an aneurysm that occurs in the first part of the aorta, between the heart and the diaphragm. The aorta is the main artery of the body. It supplies blood from the heart to the rest of the body. Some aneurysms may not cause symptoms or problems. However, the major concern with a thoracic aortic aneurysm is that it can enlarge and burst (rupture), or blood can flow between the layers of  the wall of the aorta through a tear (aorticdissection). Both of these conditions can cause bleeding inside the body and can be life-threatening if they are not diagnosed and treated right away. What are the causes? The exact cause of this condition is not known. What increases the risk? The following factors may make you more likely to develop this condition:  Being age 51 or older.  Having a hardening of the arteries caused by the buildup of fat and other substances in the lining of a blood vessel (arteriosclerosis).  Having inflammation of the walls of an artery (arteritis).  Having a genetic disease that weakens the body's connective tissue, such as Marfan syndrome.  Having an injury or trauma to the aorta.  Having an infection that is caused by bacteria, such as syphilis or staphylococcus, in the wall of the aorta (infectious aortitis).  Having high blood pressure (hypertension).  Being male.  Being white (Caucasian).  Having high cholesterol.  Having a family history of aneurysms.  Using tobacco.  Having chronic obstructive pulmonary disease (COPD).  What are the signs or symptoms? Symptoms of  this condition vary depending on the size and rate of growth of the aneurysm. Most grow slowly and do not cause any symptoms. When symptoms do occur, they may include:  Pain in the chest, back, sides, or abdomen. The pain may vary in intensity. A sudden onset of severe pain may indicate that the aneurysm has ruptured.  Hoarseness.  Cough.  Shortness of breath.  Swallowing problems.  Swelling in the face, arms, or legs.  Fever.  Unexplained weight loss.  How is this diagnosed? This condition may be diagnosed with:  An ultrasound.  X-rays.  A CT scan.  An MRI.  Tests to check the arteries for damage or blockages (angiogram).  Most unruptured thoracic aortic aneurysms cause no symptoms, so they are often found during exams for other conditions. How is this treated? Treatment for this condition depends on:  The size of the aneurysm.  How fast the aneurysm is growing.  Your age.  Risk factors for rupture.  Aneurysms that are smaller than 2.2 inches (5.5 cm) may be managed by using medicines to control blood pressure, manage pain, or fight infection. You may need regular monitoring to see if the aneurysm is getting bigger. Your health care provider may recommend that you have an ultrasound every year or every 6 months. How often you need to have an ultrasound depends on the size of the aneurysm, how fast it is growing, and whether you have a family history of aneurysms. Surgical repair may be needed if your aneurysm is larger than 2.2 inches or if it is growing quickly. Follow these instructions at home: Eating and drinking  Eat a healthy diet. Your health care provider may recommend that you: ? Lower your salt (sodium) intake. In some people, too much salt can raise blood pressure and increase the risk of thoracic aortic aneurysm. ? Avoid foods that are high in saturated fat and cholesterol, such as red meat and dairy. ? Eat a diet that is low in sugar. ? Increase your  fiber intake by including whole grains, vegetables, and fruits in your diet. Eating these foods may help to lower blood pressure.  Limit or avoid alcohol as recommended by your health care provider. Lifestyle  Follow instructions from your health care provider about healthy lifestyle habits. Your health care provider may recommend that you: ? Do not use any products that contain nicotine or  tobacco, such as cigarettes and e-cigarettes. If you need help quitting, ask your health care provider. ? Keep your blood pressure within normal limits. The target limit for most people is below 120/80. Check your blood pressure regularly. If it is high, ask your health care provider about ways that you can control it. ? Keep your blood sugar (glucose) level and cholesterol levels within normal limits. Target limits for most people are:  Blood glucose level: Less than 100 mg/dL.  Total cholesterol level: Less than 200 mg/dL. ? Maintain a healthy weight. Activity  Stay physically active and exercise regularly. Talk with your health care provider about how often you should exercise and ask which types of exercise are safe for you.  Avoid heavy lifting and activities that take a lot of effort (are strenuous). Ask your health care provider what activities are safe for you. General instructions  Keep all follow-up visits as told by your health care provider. This is important. ? Talk with your health care provider about regular screenings to see if the aneurysm is getting bigger.  Take over-the-counter and prescription medicines only as told by your health care provider. Contact a health care provider if:  You have discomfort in your upper back, neck, or abdomen.  You have trouble swallowing.  You have a cough or hoarseness.  You have a family history of aneurysms.  You have unexplained weight loss. Get help right away if:  You have sudden, severe pain in your upper back and abdomen. This pain may  move into your chest and arms.  You have shortness of breath.  You have a fever. This information is not intended to replace advice given to you by your health care provider. Make sure you discuss any questions you have with your health care provider. Document Released: 04/16/2005 Document Revised: 01/27/2016 Document Reviewed: 01/27/2016 Elsevier Interactive Patient Education  [employer].

## 2017-05-24 ENCOUNTER — Other Ambulatory Visit: Payer: Self-pay | Admitting: Pharmacist

## 2017-05-27 ENCOUNTER — Other Ambulatory Visit: Payer: Self-pay | Admitting: Pharmacist

## 2017-05-29 NOTE — Pre-Procedure Instructions (Signed)
[name] Anderson  05/29/2017      [facility] - [address], [place] [address] Phone: [phone] Fax: [phone]    Your procedure is scheduled on February 4  Report to [facility]  Call this number if you have problems the morning of surgery:  [phone]   Remember:  Do not eat food or drink liquids after midnight.  Continue all medications as directed by your physician except follow these medication instructions before surgery below   Take these medicines the morning of surgery with A SIP OF WATER  acetaminophen (TYLENOL) cetirizine (ZYRTEC)  Eye drops if needed  7 days prior to surgery STOP taking any Aspirin(unless otherwise instructed by your surgeon), Aleve, Naproxen, Ibuprofen, Motrin, Advil, Goody's, BC's, all herbal medications, fish oil, and all vitamins  Follow your doctors instructions regarding your Aspirin.  If no instructions were given by your doctor, then you will need to call the prescribing office office to get instructions.      Do not wear jewelry  Do not wear lotions, powders, or cologne, or deodorant.  Men may shave face and neck.  Do not bring valuables to the hospital.  [facility] is not responsible for any belongings or valuables.  Contacts, dentures or bridgework may not be worn into surgery.  Leave your suitcase in the car.  After surgery it may be brought to your room.  For patients admitted to the hospital, discharge time will be determined by your treatment team.  Patients discharged the day of surgery will not be allowed to drive home.    Special instructions:   [facility]- Preparing For Surgery  Before surgery, you can play an important role. Because skin is not sterile, your skin needs to be as free of germs as possible. You can reduce the number of germs on your skin by washing with CHG  (chlorahexidine gluconate) Soap before surgery.  CHG is an antiseptic cleaner which kills germs and bonds with the skin to continue killing germs even after washing.  Please do not use if you have an allergy to CHG or antibacterial soaps. If your skin becomes reddened/irritated stop using the CHG.  Do not shave (including legs and underarms) for at least 48 hours prior to first CHG shower. It is OK to shave your face.  Please follow these instructions carefully.   1. Shower the NIGHT BEFORE SURGERY and the MORNING OF SURGERY with CHG.   2. If you chose to wash your hair, wash your hair first as usual with your normal shampoo.  3. After you shampoo, rinse your hair and body thoroughly to remove the shampoo.  4. Use CHG as you would any other liquid soap. You can apply CHG directly to the skin and wash gently with a scrungie or a clean washcloth.   5. Apply the CHG Soap to your body ONLY FROM THE NECK DOWN.  Do not use on open wounds or open sores. Avoid contact with your eyes, ears, mouth and genitals (private parts). Wash Face and genitals (private parts)  with your normal soap.  6. Wash thoroughly, paying special attention to the area where your surgery will be performed.  7. Thoroughly rinse your body with warm water from the neck down.  8. DO NOT shower/wash with your normal soap after using and rinsing off the CHG Soap.  9. Pat yourself dry with a CLEAN TOWEL.  10. Wear CLEAN PAJAMAS to bed the night before surgery, wear comfortable clothes the morning of surgery  11. Place CLEAN SHEETS on your bed the night of your first shower and DO NOT SLEEP WITH PETS.    Day of Surgery: Do not apply any deodorants/lotions. Please wear clean clothes to the hospital/surgery center.      Please read over the following fact sheets that you were given.

## 2017-05-30 ENCOUNTER — Ambulatory Visit (HOSPITAL_COMMUNITY)
Admission: RE | Admit: 2017-05-30 | Discharge: 2017-05-30 | Disposition: A | Discharge status: Home / Self Care | Payer: 59 | Source: Ambulatory Visit | Attending: Cardiothoracic Surgery | Admitting: Cardiothoracic Surgery

## 2017-05-30 ENCOUNTER — Encounter (HOSPITAL_COMMUNITY)
Admission: RE | Admit: 2017-05-30 | Discharge: 2017-05-30 | Disposition: A | Discharge status: Home / Self Care | Payer: 59 | Source: Ambulatory Visit | Attending: Cardiothoracic Surgery | Admitting: Cardiothoracic Surgery

## 2017-05-30 ENCOUNTER — Other Ambulatory Visit: Payer: Self-pay

## 2017-05-30 ENCOUNTER — Ambulatory Visit (HOSPITAL_COMMUNITY): Payer: 59

## 2017-05-30 ENCOUNTER — Ambulatory Visit (HOSPITAL_BASED_OUTPATIENT_CLINIC_OR_DEPARTMENT_OTHER)
Admission: RE | Admit: 2017-05-30 | Discharge: 2017-05-30 | Disposition: A | Discharge status: Home / Self Care | Payer: 59 | Source: Ambulatory Visit | Attending: Cardiothoracic Surgery | Admitting: Cardiothoracic Surgery

## 2017-05-30 ENCOUNTER — Encounter (HOSPITAL_COMMUNITY): Payer: Self-pay

## 2017-05-30 DIAGNOSIS — I351 Nonrheumatic aortic (valve) insufficiency: Secondary | ICD-10-CM | POA: Insufficient documentation

## 2017-05-30 DIAGNOSIS — J449 Chronic obstructive pulmonary disease, unspecified: Secondary | ICD-10-CM | POA: Diagnosis not present

## 2017-05-30 DIAGNOSIS — Z952 Presence of prosthetic heart valve: Secondary | ICD-10-CM | POA: Diagnosis not present

## 2017-05-30 DIAGNOSIS — Z01812 Encounter for preprocedural laboratory examination: Secondary | ICD-10-CM | POA: Insufficient documentation

## 2017-05-30 DIAGNOSIS — Z01810 Encounter for preprocedural cardiovascular examination: Secondary | ICD-10-CM | POA: Insufficient documentation

## 2017-05-30 HISTORY — DX: Adverse effect of unspecified anesthetic, initial encounter: T41.45XA

## 2017-05-30 HISTORY — DX: Other complications of anesthesia, initial encounter: T88.59XA

## 2017-05-30 LAB — PULMONARY FUNCTION TEST
DL/VA % pred: 86 %
DL/VA: 4.02 ml/min/mmHg/L
DLCO unc % pred: 72 %
DLCO unc: 24.56 ml/min/mmHg
FEF 25-75 Post: 3.46 L/sec
FEF 25-75 Pre: 4.16 L/sec
FEF2575-%Change-Post: -16 %
FEF2575-%Pred-Post: 115 %
FEF2575-%Pred-Pre: 138 %
FEV1-%Change-Post: -3 %
FEV1-%Pred-Post: 90 %
FEV1-%Pred-Pre: 94 %
FEV1-Post: 3.35 L
FEV1-Pre: 3.47 L
FEV1FVC-%Change-Post: 3 %
FEV1FVC-%Pred-Pre: 109 %
FEV6-%Change-Post: -6 %
FEV6-%Pred-Post: 84 %
FEV6-%Pred-Pre: 89 %
FEV6-Post: 3.92 L
FEV6-Pre: 4.18 L
FEV6FVC-%Change-Post: 0 %
FEV6FVC-%Pred-Post: 105 %
FEV6FVC-%Pred-Pre: 104 %
FVC-%Change-Post: -7 %
FVC-%Pred-Post: 80 %
FVC-%Pred-Pre: 86 %
FVC-Post: 3.92 L
FVC-Pre: 4.22 L
Post FEV1/FVC ratio: 85 %
Post FEV6/FVC ratio: 100 %
Pre FEV1/FVC ratio: 82 %
Pre FEV6/FVC Ratio: 99 %
RV % pred: 79 %
RV: 1.84 L
TLC % pred: 83 %
TLC: 6.04 L

## 2017-05-30 LAB — BLOOD GAS, ARTERIAL
Acid-base deficit: 0.8 mmol/L (ref 0.0–2.0)
Bicarbonate: 22.9 mmol/L (ref 20.0–28.0)
Drawn by: 421801
FIO2: 21
O2 Saturation: 97 %
Patient temperature: 98.6
pCO2 arterial: 35 mmHg (ref 32.0–48.0)
pH, Arterial: 7.432 (ref 7.350–7.450)
pO2, Arterial: 86.7 mmHg (ref 83.0–108.0)

## 2017-05-30 LAB — URINALYSIS, ROUTINE W REFLEX MICROSCOPIC
Bilirubin Urine: NEGATIVE
Glucose, UA: NEGATIVE mg/dL
Hgb urine dipstick: NEGATIVE
Ketones, ur: NEGATIVE mg/dL
Leukocytes, UA: NEGATIVE
Nitrite: NEGATIVE
Protein, ur: NEGATIVE mg/dL
Specific Gravity, Urine: 1.014 (ref 1.005–1.030)
pH: 5 (ref 5.0–8.0)

## 2017-05-30 LAB — CBC
HCT: 42.5 % (ref 39.0–52.0)
Hemoglobin: 14.8 g/dL (ref 13.0–17.0)
MCH: 29.1 pg (ref 26.0–34.0)
MCHC: 34.8 g/dL (ref 30.0–36.0)
MCV: 83.5 fL (ref 78.0–100.0)
Platelets: 182 10*3/uL (ref 150–400)
RBC: 5.09 MIL/uL (ref 4.22–5.81)
RDW: 15.7 % — ABNORMAL HIGH (ref 11.5–15.5)
WBC: 9.6 10*3/uL (ref 4.0–10.5)

## 2017-05-30 LAB — COMPREHENSIVE METABOLIC PANEL
ALT: 13 U/L — ABNORMAL LOW (ref 17–63)
AST: 19 U/L (ref 15–41)
Albumin: 3.9 g/dL (ref 3.5–5.0)
Alkaline Phosphatase: 63 U/L (ref 38–126)
Anion gap: 11 (ref 5–15)
BUN: 17 mg/dL (ref 6–20)
CO2: 18 mmol/L — ABNORMAL LOW (ref 22–32)
Calcium: 9.1 mg/dL (ref 8.9–10.3)
Chloride: 108 mmol/L (ref 101–111)
Creatinine, Ser: 0.96 mg/dL (ref 0.61–1.24)
GFR calc Af Amer: >60 mL/min (ref >60)
GFR calc non Af Amer: >60 mL/min (ref >60)
Glucose, Bld: 96 mg/dL (ref 65–99)
Potassium: 4.3 mmol/L (ref 3.5–5.1)
Sodium: 137 mmol/L (ref 135–145)
Total Bilirubin: 0.8 mg/dL (ref 0.3–1.2)
Total Protein: 7.3 g/dL (ref 6.5–8.1)

## 2017-05-30 LAB — TYPE AND SCREEN
ABO/RH(D): A POS
Antibody Screen: NEGATIVE

## 2017-05-30 LAB — SURGICAL PCR SCREEN
MRSA, PCR: NEGATIVE
Staphylococcus aureus: POSITIVE — AB

## 2017-05-30 LAB — HEMOGLOBIN A1C
Hgb A1c MFr Bld: 5.2 % (ref 4.8–5.6)
Mean Plasma Glucose: 102.54 mg/dL

## 2017-05-30 LAB — ABO/RH: ABO/RH(D): A POS

## 2017-05-30 LAB — APTT: aPTT: 32 seconds (ref 24–36)

## 2017-05-30 LAB — PROTIME-INR
INR: 1.04
Prothrombin Time: 13.5 seconds (ref 11.4–15.2)

## 2017-05-30 MED ORDER — ALBUTEROL SULFATE (2.5 MG/3ML) 0.083% IN NEBU
2.5000 mg | INHALATION_SOLUTION | Freq: Once | RESPIRATORY_TRACT | Status: AC
  Administered 2017-05-30: 2.5 mg via RESPIRATORY_TRACT

## 2017-05-30 NOTE — Progress Notes (Signed)
PCP - Antony Contras Cardiologist - [place]  Chest x-ray - 05/30/17 EKG - 05/30/17 Stress Test - 2017 but cant remember  ECHO - 2019 Cardiac [id number] -   Sleep Study - > 10 years no CPAP CPAP - NO     Aspirin Instructions: last dose 1/24  Anesthesia review:  YES  Patient denies shortness of breath, fever, cough and chest pain at PAT appointment   Patient verbalized understanding of instructions that were given to them at the PAT appointment. Patient was also instructed that they will need to review over the PAT instructions again at home before surgery.

## 2017-05-30 NOTE — Progress Notes (Signed)
Pre Operative AVR Dopplers completed. Bilateral - No evidence of significant ICA stenosis. Vertebral artery flow is antegrade. Palmar arch - Doppler waveforms remained normal bilaterally with both radial and ulnar compressions. [facility], [place] 05/30/2017. 11:57 AM

## 2017-05-30 NOTE — Progress Notes (Signed)
prescription called in at [facility], patient verbalized understanding of instructions

## 2017-05-31 ENCOUNTER — Other Ambulatory Visit: Payer: 59

## 2017-05-31 MED ORDER — DOPAMINE-DEXTROSE 3.2-5 MG/ML-% IV SOLN
0.0000 ug/kg/min | INTRAVENOUS | Status: DC
  Filled 2017-05-31: qty 250

## 2017-05-31 MED ORDER — NITROGLYCERIN IN D5W 200-5 MCG/ML-% IV SOLN
2.0000 ug/min | INTRAVENOUS | Status: DC
  Filled 2017-05-31: qty 250

## 2017-05-31 MED ORDER — VANCOMYCIN HCL 10 G IV SOLR
1500.0000 mg | INTRAVENOUS | Status: DC
  Filled 2017-05-31: qty 1500

## 2017-05-31 MED ORDER — METOPROLOL TARTRATE 12.5 MG HALF TABLET
12.5000 mg | ORAL_TABLET | Freq: Once | ORAL | Status: DC
  Administered 2017-06-05: 12.5 mg via ORAL

## 2017-05-31 MED ORDER — PLASMA-LYTE 148 IV SOLN
INTRAVENOUS | Status: DC
  Filled 2017-05-31: qty 2.5

## 2017-05-31 MED ORDER — SODIUM CHLORIDE 0.9 % IV SOLN
30.0000 ug/min | INTRAVENOUS | Status: DC
  Filled 2017-05-31: qty 2

## 2017-05-31 MED ORDER — TRANEXAMIC ACID 1000 MG/10ML IV SOLN
1.5000 mg/kg/h | INTRAVENOUS | Status: DC
  Filled 2017-05-31: qty 25

## 2017-05-31 MED ORDER — SODIUM CHLORIDE 0.9 % IV SOLN
INTRAVENOUS | Status: DC
  Filled 2017-05-31: qty 30

## 2017-05-31 MED ORDER — MILRINONE LACTATE IN DEXTROSE 20-5 MG/100ML-% IV SOLN
0.1250 ug/kg/min | INTRAVENOUS | Status: DC
  Filled 2017-05-31: qty 100

## 2017-05-31 MED ORDER — EPINEPHRINE PF 1 MG/ML IJ SOLN
0.0000 ug/min | INTRAMUSCULAR | Status: DC
  Filled 2017-05-31: qty 4

## 2017-05-31 MED ORDER — TRANEXAMIC ACID (OHS) BOLUS VIA INFUSION
15.0000 mg/kg | INTRAVENOUS | Status: DC
  Filled 2017-05-31: qty 1422

## 2017-05-31 MED ORDER — SODIUM CHLORIDE 0.9 % IV SOLN
INTRAVENOUS | Status: DC
  Filled 2017-05-31: qty 1

## 2017-05-31 MED ORDER — DEXTROSE 5 % IV SOLN
1.5000 g | INTRAVENOUS | Status: DC
  Filled 2017-05-31: qty 1.5

## 2017-05-31 MED ORDER — POTASSIUM CHLORIDE 2 MEQ/ML IV SOLN
80.0000 meq | INTRAVENOUS | Status: DC
  Filled 2017-05-31: qty 40

## 2017-05-31 MED ORDER — TRANEXAMIC ACID (OHS) PUMP PRIME SOLUTION
2.0000 mg/kg | INTRAVENOUS | Status: DC
Start: 2017-06-03 — End: 2017-06-04
  Filled 2017-05-31: qty 1.9

## 2017-05-31 MED ORDER — DEXTROSE 5 % IV SOLN
750.0000 mg | INTRAVENOUS | Status: DC
  Filled 2017-05-31: qty 750

## 2017-05-31 MED ORDER — MAGNESIUM SULFATE 50 % IJ SOLN
40.0000 meq | INTRAMUSCULAR | Status: DC
  Filled 2017-05-31: qty 9.85

## 2017-05-31 MED ORDER — DEXMEDETOMIDINE HCL IN NACL 400 MCG/100ML IV SOLN
0.1000 ug/kg/h | INTRAVENOUS | Status: DC
  Filled 2017-05-31: qty 100

## 2017-05-31 MED ORDER — CHLORHEXIDINE GLUCONATE 0.12 % MT SOLN
15.0000 mL | Freq: Once | OROMUCOSAL | Status: DC
  Administered 2017-06-05: 15 mL via OROMUCOSAL

## 2017-06-01 ENCOUNTER — Ambulatory Visit
Admission: RE | Admit: 2017-06-01 | Discharge: 2017-06-01 | Disposition: A | Discharge status: Home / Self Care | Payer: 59 | Source: Ambulatory Visit | Attending: Cardiothoracic Surgery | Admitting: Cardiothoracic Surgery

## 2017-06-01 DIAGNOSIS — M25552 Pain in left hip: Secondary | ICD-10-CM | POA: Diagnosis not present

## 2017-06-01 DIAGNOSIS — Z01818 Encounter for other preprocedural examination: Secondary | ICD-10-CM

## 2017-06-01 DIAGNOSIS — M009 Pyogenic arthritis, unspecified: Secondary | ICD-10-CM

## 2017-06-01 DIAGNOSIS — M25551 Pain in right hip: Secondary | ICD-10-CM | POA: Diagnosis not present

## 2017-06-04 MED ORDER — NITROGLYCERIN IN D5W 200-5 MCG/ML-% IV SOLN
2.0000 ug/min | INTRAVENOUS | Status: DC
  Filled 2017-06-04: qty 250

## 2017-06-04 MED ORDER — SODIUM CHLORIDE 0.9 % IV SOLN
INTRAVENOUS | Status: DC
  Filled 2017-06-04: qty 30

## 2017-06-04 MED ORDER — POTASSIUM CHLORIDE 2 MEQ/ML IV SOLN
80.0000 meq | INTRAVENOUS | Status: DC
  Filled 2017-06-04: qty 40

## 2017-06-04 MED ORDER — VANCOMYCIN HCL 10 G IV SOLR
1500.0000 mg | INTRAVENOUS | Status: AC
  Administered 2017-06-05: 1500 mg via INTRAVENOUS
  Filled 2017-06-04: qty 1500

## 2017-06-04 MED ORDER — DOPAMINE-DEXTROSE 3.2-5 MG/ML-% IV SOLN
0.0000 ug/kg/min | INTRAVENOUS | Status: DC
  Filled 2017-06-04: qty 250

## 2017-06-04 MED ORDER — DEXTROSE 5 % IV SOLN
1.5000 g | INTRAVENOUS | Status: AC
  Administered 2017-06-05: 1.5 g via INTRAVENOUS
  Filled 2017-06-04: qty 1.5

## 2017-06-04 MED ORDER — NOREPINEPHRINE BITARTRATE 1 MG/ML IV SOLN
0.0000 ug/min | INTRAVENOUS | Status: DC
  Filled 2017-06-04: qty 4

## 2017-06-04 MED ORDER — MAGNESIUM SULFATE 50 % IJ SOLN
40.0000 meq | INTRAMUSCULAR | Status: DC
  Filled 2017-06-04: qty 9.85

## 2017-06-04 MED ORDER — SODIUM CHLORIDE 0.9 % IV SOLN
30.0000 ug/min | INTRAVENOUS | Status: AC
  Administered 2017-06-05: 25 ug/min via INTRAVENOUS
  Filled 2017-06-04: qty 2

## 2017-06-04 MED ORDER — EPINEPHRINE PF 1 MG/ML IJ SOLN
0.0000 ug/min | INTRAVENOUS | Status: DC
  Filled 2017-06-04: qty 4

## 2017-06-04 MED ORDER — DEXMEDETOMIDINE HCL IN NACL 400 MCG/100ML IV SOLN
0.1000 ug/kg/h | INTRAVENOUS | Status: AC
  Administered 2017-06-05: .5 ug/kg/h via INTRAVENOUS
  Filled 2017-06-04: qty 100

## 2017-06-04 MED ORDER — SODIUM CHLORIDE 0.9 % IV SOLN
INTRAVENOUS | Status: AC
  Administered 2017-06-05: .8 [IU]/h via INTRAVENOUS
  Filled 2017-06-04: qty 1

## 2017-06-05 ENCOUNTER — Inpatient Hospital Stay (HOSPITAL_COMMUNITY): Payer: 59

## 2017-06-05 ENCOUNTER — Inpatient Hospital Stay (HOSPITAL_COMMUNITY): Payer: 59 | Admitting: Emergency Medicine

## 2017-06-05 ENCOUNTER — Inpatient Hospital Stay (HOSPITAL_COMMUNITY)
Admission: RE | Admit: 2017-06-05 | Discharge: 2017-06-09 | DRG: 220 | Disposition: A | Discharge status: Home / Self Care | Payer: 59 | Source: Ambulatory Visit | Attending: Cardiothoracic Surgery | Admitting: Cardiothoracic Surgery

## 2017-06-05 ENCOUNTER — Encounter (HOSPITAL_COMMUNITY): Payer: Self-pay | Admitting: *Deleted

## 2017-06-05 ENCOUNTER — Encounter (HOSPITAL_COMMUNITY)
Admission: RE | Disposition: A | Discharge status: Home / Self Care | Payer: Self-pay | Source: Ambulatory Visit | Attending: Cardiothoracic Surgery

## 2017-06-05 ENCOUNTER — Other Ambulatory Visit: Payer: Self-pay

## 2017-06-05 DIAGNOSIS — I4891 Unspecified atrial fibrillation: Secondary | ICD-10-CM | POA: Diagnosis not present

## 2017-06-05 DIAGNOSIS — D696 Thrombocytopenia, unspecified: Secondary | ICD-10-CM | POA: Diagnosis not present

## 2017-06-05 DIAGNOSIS — Z79899 Other long term (current) drug therapy: Secondary | ICD-10-CM

## 2017-06-05 DIAGNOSIS — Z87442 Personal history of urinary calculi: Secondary | ICD-10-CM | POA: Diagnosis not present

## 2017-06-05 DIAGNOSIS — D62 Acute posthemorrhagic anemia: Secondary | ICD-10-CM | POA: Diagnosis not present

## 2017-06-05 DIAGNOSIS — Z85828 Personal history of other malignant neoplasm of skin: Secondary | ICD-10-CM

## 2017-06-05 DIAGNOSIS — I330 Acute and subacute infective endocarditis: Secondary | ICD-10-CM | POA: Diagnosis not present

## 2017-06-05 DIAGNOSIS — J9811 Atelectasis: Secondary | ICD-10-CM | POA: Diagnosis not present

## 2017-06-05 DIAGNOSIS — I351 Nonrheumatic aortic (valve) insufficiency: Secondary | ICD-10-CM | POA: Diagnosis not present

## 2017-06-05 DIAGNOSIS — F17210 Nicotine dependence, cigarettes, uncomplicated: Secondary | ICD-10-CM | POA: Diagnosis present

## 2017-06-05 DIAGNOSIS — I77819 Aortic ectasia, unspecified site: Secondary | ICD-10-CM | POA: Diagnosis not present

## 2017-06-05 DIAGNOSIS — Z8547 Personal history of malignant neoplasm of testis: Secondary | ICD-10-CM | POA: Diagnosis not present

## 2017-06-05 DIAGNOSIS — E8770 Fluid overload, unspecified: Secondary | ICD-10-CM | POA: Diagnosis not present

## 2017-06-05 DIAGNOSIS — R002 Palpitations: Secondary | ICD-10-CM | POA: Diagnosis not present

## 2017-06-05 DIAGNOSIS — I339 Acute and subacute endocarditis, unspecified: Secondary | ICD-10-CM | POA: Diagnosis not present

## 2017-06-05 DIAGNOSIS — E7800 Pure hypercholesterolemia, unspecified: Secondary | ICD-10-CM | POA: Diagnosis present

## 2017-06-05 DIAGNOSIS — Z86010 Personal history of colonic polyps: Secondary | ICD-10-CM

## 2017-06-05 DIAGNOSIS — Z952 Presence of prosthetic heart valve: Secondary | ICD-10-CM

## 2017-06-05 DIAGNOSIS — I38 Endocarditis, valve unspecified: Secondary | ICD-10-CM | POA: Diagnosis not present

## 2017-06-05 DIAGNOSIS — Z9689 Presence of other specified functional implants: Secondary | ICD-10-CM

## 2017-06-05 DIAGNOSIS — Z4659 Encounter for fitting and adjustment of other gastrointestinal appliance and device: Secondary | ICD-10-CM

## 2017-06-05 DIAGNOSIS — Z4682 Encounter for fitting and adjustment of non-vascular catheter: Secondary | ICD-10-CM | POA: Diagnosis not present

## 2017-06-05 DIAGNOSIS — I350 Nonrheumatic aortic (valve) stenosis: Secondary | ICD-10-CM | POA: Diagnosis not present

## 2017-06-05 HISTORY — PX: AORTIC VALVE REPLACEMENT: SHX41

## 2017-06-05 HISTORY — PX: TEE WITHOUT CARDIOVERSION: SHX5443

## 2017-06-05 LAB — POCT I-STAT, CHEM 8
BUN: 21 mg/dL — ABNORMAL HIGH (ref 6–20)
BUN: 21 mg/dL — ABNORMAL HIGH (ref 6–20)
BUN: 20 mg/dL (ref 6–20)
BUN: 21 mg/dL — ABNORMAL HIGH (ref 6–20)
BUN: 20 mg/dL (ref 6–20)
BUN: 19 mg/dL (ref 6–20)
Calcium, Ion: 1.25 mmol/L (ref 1.15–1.40)
Calcium, Ion: 1.19 mmol/L (ref 1.15–1.40)
Calcium, Ion: 1.04 mmol/L — ABNORMAL LOW (ref 1.15–1.40)
Calcium, Ion: 1.09 mmol/L — ABNORMAL LOW (ref 1.15–1.40)
Calcium, Ion: 1.05 mmol/L — ABNORMAL LOW (ref 1.15–1.40)
Calcium, Ion: 1.08 mmol/L — ABNORMAL LOW (ref 1.15–1.40)
Chloride: 106 mmol/L (ref 101–111)
Chloride: 107 mmol/L (ref 101–111)
Chloride: 104 mmol/L (ref 101–111)
Chloride: 105 mmol/L (ref 101–111)
Chloride: 104 mmol/L (ref 101–111)
Chloride: 106 mmol/L (ref 101–111)
Creatinine, Ser: 0.9 mg/dL (ref 0.61–1.24)
Creatinine, Ser: 0.9 mg/dL (ref 0.61–1.24)
Creatinine, Ser: 0.9 mg/dL (ref 0.61–1.24)
Creatinine, Ser: 0.9 mg/dL (ref 0.61–1.24)
Creatinine, Ser: 0.7 mg/dL (ref 0.61–1.24)
Creatinine, Ser: 0.9 mg/dL (ref 0.61–1.24)
Glucose, Bld: 99 mg/dL (ref 65–99)
Glucose, Bld: 101 mg/dL — ABNORMAL HIGH (ref 65–99)
Glucose, Bld: 99 mg/dL (ref 65–99)
Glucose, Bld: 135 mg/dL — ABNORMAL HIGH (ref 65–99)
Glucose, Bld: 138 mg/dL — ABNORMAL HIGH (ref 65–99)
Glucose, Bld: 164 mg/dL — ABNORMAL HIGH (ref 65–99)
HCT: 37 % — ABNORMAL LOW (ref 39.0–52.0)
HCT: 34 % — ABNORMAL LOW (ref 39.0–52.0)
HCT: 29 % — ABNORMAL LOW (ref 39.0–52.0)
HCT: 29 % — ABNORMAL LOW (ref 39.0–52.0)
HCT: 31 % — ABNORMAL LOW (ref 39.0–52.0)
HCT: 36 % — ABNORMAL LOW (ref 39.0–52.0)
Hemoglobin: 12.6 g/dL — ABNORMAL LOW (ref 13.0–17.0)
Hemoglobin: 11.6 g/dL — ABNORMAL LOW (ref 13.0–17.0)
Hemoglobin: 9.9 g/dL — ABNORMAL LOW (ref 13.0–17.0)
Hemoglobin: 9.9 g/dL — ABNORMAL LOW (ref 13.0–17.0)
Hemoglobin: 10.5 g/dL — ABNORMAL LOW (ref 13.0–17.0)
Hemoglobin: 12.2 g/dL — ABNORMAL LOW (ref 13.0–17.0)
Potassium: 4.4 mmol/L (ref 3.5–5.1)
Potassium: 4.8 mmol/L (ref 3.5–5.1)
Potassium: 4.7 mmol/L (ref 3.5–5.1)
Potassium: 5.2 mmol/L — ABNORMAL HIGH (ref 3.5–5.1)
Potassium: 4.6 mmol/L (ref 3.5–5.1)
Potassium: 4.7 mmol/L (ref 3.5–5.1)
Sodium: 142 mmol/L (ref 135–145)
Sodium: 141 mmol/L (ref 135–145)
Sodium: 141 mmol/L (ref 135–145)
Sodium: 139 mmol/L (ref 135–145)
Sodium: 139 mmol/L (ref 135–145)
Sodium: 140 mmol/L (ref 135–145)
TCO2: 27 mmol/L (ref 22–32)
TCO2: 25 mmol/L (ref 22–32)
TCO2: 26 mmol/L (ref 22–32)
TCO2: 26 mmol/L (ref 22–32)
TCO2: 25 mmol/L (ref 22–32)
TCO2: 21 mmol/L — ABNORMAL LOW (ref 22–32)

## 2017-06-05 LAB — POCT I-STAT 3, ART BLOOD GAS (G3+)
Acid-base deficit: 2 mmol/L (ref 0.0–2.0)
Acid-base deficit: 4 mmol/L — ABNORMAL HIGH (ref 0.0–2.0)
Acid-base deficit: 7 mmol/L — ABNORMAL HIGH (ref 0.0–2.0)
Acid-base deficit: 5 mmol/L — ABNORMAL HIGH (ref 0.0–2.0)
Bicarbonate: 24.5 mmol/L (ref 20.0–28.0)
Bicarbonate: 24.4 mmol/L (ref 20.0–28.0)
Bicarbonate: 21.9 mmol/L (ref 20.0–28.0)
Bicarbonate: 19.3 mmol/L — ABNORMAL LOW (ref 20.0–28.0)
Bicarbonate: 20.6 mmol/L (ref 20.0–28.0)
O2 Saturation: 100 %
O2 Saturation: 100 %
O2 Saturation: 98 %
O2 Saturation: 99 %
O2 Saturation: 97 %
Patient temperature: 37.1
Patient temperature: 36.9
TCO2: 26 mmol/L (ref 22–32)
TCO2: 26 mmol/L (ref 22–32)
TCO2: 23 mmol/L (ref 22–32)
TCO2: 21 mmol/L — ABNORMAL LOW (ref 22–32)
TCO2: 22 mmol/L (ref 22–32)
pCO2 arterial: 40.5 mmHg (ref 32.0–48.0)
pCO2 arterial: 46 mmHg (ref 32.0–48.0)
pCO2 arterial: 43.3 mmHg (ref 32.0–48.0)
pCO2 arterial: 39.3 mmHg (ref 32.0–48.0)
pCO2 arterial: 37.9 mmHg (ref 32.0–48.0)
pH, Arterial: 7.39 (ref 7.350–7.450)
pH, Arterial: 7.333 — ABNORMAL LOW (ref 7.350–7.450)
pH, Arterial: 7.312 — ABNORMAL LOW (ref 7.350–7.450)
pH, Arterial: 7.3 — ABNORMAL LOW (ref 7.350–7.450)
pH, Arterial: 7.342 — ABNORMAL LOW (ref 7.350–7.450)
pO2, Arterial: 374 mmHg — ABNORMAL HIGH (ref 83.0–108.0)
pO2, Arterial: 366 mmHg — ABNORMAL HIGH (ref 83.0–108.0)
pO2, Arterial: 116 mmHg — ABNORMAL HIGH (ref 83.0–108.0)
pO2, Arterial: 139 mmHg — ABNORMAL HIGH (ref 83.0–108.0)
pO2, Arterial: 91 mmHg (ref 83.0–108.0)

## 2017-06-05 LAB — GRAM STAIN

## 2017-06-05 LAB — CBC
HCT: 38.3 % — ABNORMAL LOW (ref 39.0–52.0)
HCT: 38.4 % — ABNORMAL LOW (ref 39.0–52.0)
Hemoglobin: 12.8 g/dL — ABNORMAL LOW (ref 13.0–17.0)
Hemoglobin: 12.8 g/dL — ABNORMAL LOW (ref 13.0–17.0)
MCH: 27.9 pg (ref 26.0–34.0)
MCH: 27.9 pg (ref 26.0–34.0)
MCHC: 33.4 g/dL (ref 30.0–36.0)
MCHC: 33.3 g/dL (ref 30.0–36.0)
MCV: 83.4 fL (ref 78.0–100.0)
MCV: 83.7 fL (ref 78.0–100.0)
Platelets: 123 10*3/uL — ABNORMAL LOW (ref 150–400)
Platelets: 139 10*3/uL — ABNORMAL LOW (ref 150–400)
RBC: 4.59 MIL/uL (ref 4.22–5.81)
RBC: 4.59 MIL/uL (ref 4.22–5.81)
RDW: 15.1 % (ref 11.5–15.5)
RDW: 15.3 % (ref 11.5–15.5)
WBC: 15.3 10*3/uL — ABNORMAL HIGH (ref 4.0–10.5)
WBC: 14.6 10*3/uL — ABNORMAL HIGH (ref 4.0–10.5)

## 2017-06-05 LAB — CREATININE, SERUM
Creatinine, Ser: 1.03 mg/dL (ref 0.61–1.24)
GFR calc Af Amer: >60 mL/min (ref >60)
GFR calc non Af Amer: >60 mL/min (ref >60)

## 2017-06-05 LAB — HEMOGLOBIN AND HEMATOCRIT, BLOOD
HCT: 30.7 % — ABNORMAL LOW (ref 39.0–52.0)
Hemoglobin: 10.3 g/dL — ABNORMAL LOW (ref 13.0–17.0)

## 2017-06-05 LAB — GLUCOSE, CAPILLARY
Glucose-Capillary: 111 mg/dL — ABNORMAL HIGH (ref 65–99)
Glucose-Capillary: 112 mg/dL — ABNORMAL HIGH (ref 65–99)
Glucose-Capillary: 111 mg/dL — ABNORMAL HIGH (ref 65–99)
Glucose-Capillary: 152 mg/dL — ABNORMAL HIGH (ref 65–99)

## 2017-06-05 LAB — POCT I-STAT 4, (NA,K, GLUC, HGB,HCT)
Glucose, Bld: 117 mg/dL — ABNORMAL HIGH (ref 65–99)
HCT: 35 % — ABNORMAL LOW (ref 39.0–52.0)
Hemoglobin: 11.9 g/dL — ABNORMAL LOW (ref 13.0–17.0)
Potassium: 4.6 mmol/L (ref 3.5–5.1)
Sodium: 142 mmol/L (ref 135–145)

## 2017-06-05 LAB — MAGNESIUM: Magnesium: 2.9 mg/dL — ABNORMAL HIGH (ref 1.7–2.4)

## 2017-06-05 LAB — PROTIME-INR
INR: 1.38
Prothrombin Time: 16.9 seconds — ABNORMAL HIGH (ref 11.4–15.2)

## 2017-06-05 LAB — PLATELET COUNT: Platelets: 149 10*3/uL — ABNORMAL LOW (ref 150–400)

## 2017-06-05 LAB — APTT: aPTT: 32 seconds (ref 24–36)

## 2017-06-05 SURGERY — REPLACEMENT, AORTIC VALVE, OPEN
Anesthesia: General | Site: Chest

## 2017-06-05 MED ORDER — MORPHINE SULFATE (PF) 4 MG/ML IV SOLN: 1.0000 mg | INTRAVENOUS | Status: AC | PRN

## 2017-06-05 MED ORDER — INSULIN ASPART 100 UNIT/ML ~~LOC~~ SOLN
0.0000 [IU] | SUBCUTANEOUS | Status: DC
  Administered 2017-06-05: 2 [IU] via SUBCUTANEOUS
  Administered 2017-06-05: 4 [IU] via SUBCUTANEOUS
  Administered 2017-06-06: 2 [IU] via SUBCUTANEOUS

## 2017-06-05 MED ORDER — ALBUMIN HUMAN 5 % IV SOLN
12.5000 g | Freq: Four times a day (QID) | INTRAVENOUS | Status: DC | PRN
  Administered 2017-06-05 (×2): 12.5 g via INTRAVENOUS

## 2017-06-05 MED ORDER — TRANEXAMIC ACID (OHS) PUMP PRIME SOLUTION
2.0000 mg/kg | INTRAVENOUS | Status: DC
  Filled 2017-06-05: qty 1.9

## 2017-06-05 MED ORDER — TRANEXAMIC ACID (OHS) BOLUS VIA INFUSION
15.0000 mg/kg | INTRAVENOUS | Status: AC
  Administered 2017-06-05: 1422 mg via INTRAVENOUS
  Filled 2017-06-05: qty 1422

## 2017-06-05 MED ORDER — MAGNESIUM SULFATE 4 GM/100ML IV SOLN
4.0000 g | Freq: Once | INTRAVENOUS | Status: AC
  Administered 2017-06-05: 4 g via INTRAVENOUS
  Filled 2017-06-05: qty 100

## 2017-06-05 MED ORDER — SODIUM CHLORIDE 0.9% FLUSH
10.0000 mL | Freq: Two times a day (BID) | INTRAVENOUS | Status: DC
  Administered 2017-06-05 – 2017-06-09 (×3): 10 mL

## 2017-06-05 MED ORDER — METOPROLOL TARTRATE 12.5 MG HALF TABLET
12.5000 mg | ORAL_TABLET | Freq: Two times a day (BID) | ORAL | Status: DC
  Administered 2017-06-05 – 2017-06-07 (×4): 12.5 mg via ORAL
  Filled 2017-06-05 (×4): qty 1

## 2017-06-05 MED ORDER — ORAL CARE MOUTH RINSE
15.0000 mL | Freq: Two times a day (BID) | OROMUCOSAL | Status: DC
  Administered 2017-06-05 – 2017-06-09 (×6): 15 mL via OROMUCOSAL

## 2017-06-05 MED ORDER — METOPROLOL TARTRATE 5 MG/5ML IV SOLN: 2.5000 mg | INTRAVENOUS | Status: DC | PRN

## 2017-06-05 MED ORDER — ACETAMINOPHEN 650 MG RE SUPP
650.0000 mg | Freq: Once | RECTAL | Status: AC
  Administered 2017-06-05: 650 mg via RECTAL

## 2017-06-05 MED ORDER — LACTATED RINGERS IV SOLN
INTRAVENOUS | Status: DC
  Administered 2017-06-05: 20 mL/h via INTRAVENOUS
  Administered 2017-06-06: 04:00:00 via INTRAVENOUS

## 2017-06-05 MED ORDER — ASPIRIN 81 MG PO CHEW: 324.0000 mg | CHEWABLE_TABLET | Freq: Every day | ORAL | Status: DC

## 2017-06-05 MED ORDER — FAMOTIDINE IN NACL 20-0.9 MG/50ML-% IV SOLN
20.0000 mg | Freq: Two times a day (BID) | INTRAVENOUS | Status: AC
  Administered 2017-06-05: 20 mg via INTRAVENOUS

## 2017-06-05 MED ORDER — TRANEXAMIC ACID 1000 MG/10ML IV SOLN
1.5000 mg/kg/h | INTRAVENOUS | Status: AC
  Administered 2017-06-05: 1.5 mg/kg/h via INTRAVENOUS
  Filled 2017-06-05: qty 25

## 2017-06-05 MED ORDER — FENTANYL CITRATE (PF) 250 MCG/5ML IJ SOLN
INTRAMUSCULAR | Status: DC | PRN
  Administered 2017-06-05: 150 ug via INTRAVENOUS
  Administered 2017-06-05: 50 ug via INTRAVENOUS
  Administered 2017-06-05: 250 ug via INTRAVENOUS
  Administered 2017-06-05: 150 ug via INTRAVENOUS
  Administered 2017-06-05: 200 ug via INTRAVENOUS
  Administered 2017-06-05: 150 ug via INTRAVENOUS
  Administered 2017-06-05: 200 ug via INTRAVENOUS
  Administered 2017-06-05: 100 ug via INTRAVENOUS
  Administered 2017-06-05: 250 ug via INTRAVENOUS

## 2017-06-05 MED ORDER — EPHEDRINE 5 MG/ML INJ
INTRAVENOUS | Status: AC
  Filled 2017-06-05: qty 10

## 2017-06-05 MED ORDER — PNEUMOCOCCAL VAC POLYVALENT 25 MCG/0.5ML IJ INJ: 0.5000 mL | INJECTION | INTRAMUSCULAR | Status: DC | PRN

## 2017-06-05 MED ORDER — ARTIFICIAL TEARS OPHTHALMIC OINT
TOPICAL_OINTMENT | OPHTHALMIC | Status: DC | PRN
  Administered 2017-06-05: 1 via OPHTHALMIC

## 2017-06-05 MED ORDER — ROCURONIUM BROMIDE 10 MG/ML (PF) SYRINGE
PREFILLED_SYRINGE | INTRAVENOUS | Status: AC
  Filled 2017-06-05: qty 10

## 2017-06-05 MED ORDER — SODIUM CHLORIDE 0.9 % IV SOLN
INTRAVENOUS | Status: DC
  Filled 2017-06-05: qty 1

## 2017-06-05 MED ORDER — SODIUM CHLORIDE 0.9 % IV SOLN: 250.0000 mL | INTRAVENOUS | Status: DC

## 2017-06-05 MED ORDER — PHENYLEPHRINE 40 MCG/ML (10ML) SYRINGE FOR IV PUSH (FOR BLOOD PRESSURE SUPPORT)
PREFILLED_SYRINGE | INTRAVENOUS | Status: DC | PRN
  Administered 2017-06-05: 80 ug via INTRAVENOUS
  Administered 2017-06-05: 40 ug via INTRAVENOUS
  Administered 2017-06-05: 80 ug via INTRAVENOUS

## 2017-06-05 MED ORDER — ORAL CARE MOUTH RINSE: 15.0000 mL | Freq: Four times a day (QID) | OROMUCOSAL | Status: DC

## 2017-06-05 MED ORDER — SODIUM CHLORIDE 0.9% FLUSH
3.0000 mL | Freq: Two times a day (BID) | INTRAVENOUS | Status: DC
  Administered 2017-06-07: 3 mL via INTRAVENOUS

## 2017-06-05 MED ORDER — LACTATED RINGERS IV SOLN
500.0000 mL | Freq: Once | INTRAVENOUS | Status: AC | PRN
  Administered 2017-06-05: 500 mL via INTRAVENOUS

## 2017-06-05 MED ORDER — MIDAZOLAM HCL 5 MG/5ML IJ SOLN
INTRAMUSCULAR | Status: DC | PRN
  Administered 2017-06-05: 5 mg via INTRAVENOUS
  Administered 2017-06-05 (×2): 1 mg via INTRAVENOUS
  Administered 2017-06-05: 3 mg via INTRAVENOUS

## 2017-06-05 MED ORDER — NITROGLYCERIN IN D5W 200-5 MCG/ML-% IV SOLN: 0.0000 ug/min | INTRAVENOUS | Status: DC

## 2017-06-05 MED ORDER — SODIUM CHLORIDE 0.9 % IV SOLN
0.0000 ug/min | INTRAVENOUS | Status: DC
  Filled 2017-06-05: qty 2

## 2017-06-05 MED ORDER — BISACODYL 5 MG PO TBEC
10.0000 mg | DELAYED_RELEASE_TABLET | Freq: Every day | ORAL | Status: DC
  Administered 2017-06-06: 10 mg via ORAL
  Filled 2017-06-05: qty 2

## 2017-06-05 MED ORDER — MORPHINE SULFATE (PF) 2 MG/ML IV SOLN: 2.0000 mg | INTRAVENOUS | Status: DC | PRN

## 2017-06-05 MED ORDER — ONDANSETRON HCL 4 MG/2ML IJ SOLN: 4.0000 mg | Freq: Four times a day (QID) | INTRAMUSCULAR | Status: DC | PRN

## 2017-06-05 MED ORDER — ROCURONIUM BROMIDE 100 MG/10ML IV SOLN
INTRAVENOUS | Status: DC | PRN
  Administered 2017-06-05: 20 mg via INTRAVENOUS
  Administered 2017-06-05: 100 mg via INTRAVENOUS
  Administered 2017-06-05: 50 mg via INTRAVENOUS
  Administered 2017-06-05: 100 mg via INTRAVENOUS

## 2017-06-05 MED ORDER — ATORVASTATIN CALCIUM 10 MG PO TABS
10.0000 mg | ORAL_TABLET | Freq: Every day | ORAL | Status: DC
  Administered 2017-06-06 – 2017-06-08 (×3): 10 mg via ORAL
  Filled 2017-06-05 (×3): qty 1

## 2017-06-05 MED ORDER — FENTANYL CITRATE (PF) 250 MCG/5ML IJ SOLN
INTRAMUSCULAR | Status: AC
  Filled 2017-06-05: qty 5

## 2017-06-05 MED ORDER — DOCUSATE SODIUM 100 MG PO CAPS
200.0000 mg | ORAL_CAPSULE | Freq: Every day | ORAL | Status: DC
  Administered 2017-06-06: 200 mg via ORAL
  Filled 2017-06-05 (×2): qty 2

## 2017-06-05 MED ORDER — HEMOSTATIC AGENTS (NO CHARGE) OPTIME
TOPICAL | Status: DC | PRN
  Administered 2017-06-05: 1 via TOPICAL

## 2017-06-05 MED ORDER — SODIUM BICARBONATE 8.4 % IV SOLN
50.0000 meq | Freq: Once | INTRAVENOUS | Status: AC
  Administered 2017-06-05: 50 meq via INTRAVENOUS

## 2017-06-05 MED ORDER — SODIUM CHLORIDE 0.9 % IV SOLN
INTRAVENOUS | Status: DC
  Administered 2017-06-05: 10 mL/h via INTRAVENOUS

## 2017-06-05 MED ORDER — ALBUMIN HUMAN 5 % IV SOLN
INTRAVENOUS | Status: DC | PRN
  Administered 2017-06-05: 13:00:00 via INTRAVENOUS

## 2017-06-05 MED ORDER — LACTATED RINGERS IV SOLN
INTRAVENOUS | Status: DC | PRN
  Administered 2017-06-05: 09:00:00 via INTRAVENOUS

## 2017-06-05 MED ORDER — CHLORHEXIDINE GLUCONATE 0.12 % MT SOLN
OROMUCOSAL | Status: AC
  Administered 2017-06-05: 15 mL via OROMUCOSAL
  Filled 2017-06-05: qty 15

## 2017-06-05 MED ORDER — LACTATED RINGERS IV SOLN
INTRAVENOUS | Status: DC | PRN
Start: 2017-06-05 — End: 2017-06-05
  Administered 2017-06-05: 08:00:00 via INTRAVENOUS

## 2017-06-05 MED ORDER — HEMOSTATIC AGENTS (NO CHARGE) OPTIME
TOPICAL | Status: DC | PRN
  Administered 2017-06-05 (×3): 1 via TOPICAL

## 2017-06-05 MED ORDER — HEPARIN SODIUM (PORCINE) 1000 UNIT/ML IJ SOLN
INTRAMUSCULAR | Status: AC
  Filled 2017-06-05: qty 1

## 2017-06-05 MED ORDER — PHENYLEPHRINE 40 MCG/ML (10ML) SYRINGE FOR IV PUSH (FOR BLOOD PRESSURE SUPPORT)
PREFILLED_SYRINGE | INTRAVENOUS | Status: AC
  Filled 2017-06-05: qty 10

## 2017-06-05 MED ORDER — DEXMEDETOMIDINE HCL IN NACL 400 MCG/100ML IV SOLN
0.4000 ug/kg/h | INTRAVENOUS | Status: DC
  Filled 2017-06-05: qty 100

## 2017-06-05 MED ORDER — MORPHINE SULFATE (PF) 4 MG/ML IV SOLN
2.0000 mg | INTRAVENOUS | Status: DC | PRN
Start: 2017-06-05 — End: 2017-06-07

## 2017-06-05 MED ORDER — PROPOFOL 10 MG/ML IV BOLUS
INTRAVENOUS | Status: AC
  Filled 2017-06-05: qty 20

## 2017-06-05 MED ORDER — DEXMEDETOMIDINE HCL IN NACL 200 MCG/50ML IV SOLN
0.0000 ug/kg/h | INTRAVENOUS | Status: DC
  Administered 2017-06-05: 0.4 ug/kg/h via INTRAVENOUS

## 2017-06-05 MED ORDER — ACETAMINOPHEN 160 MG/5ML PO SOLN: 650.0000 mg | Freq: Once | ORAL | Status: AC

## 2017-06-05 MED ORDER — METOPROLOL TARTRATE 25 MG/10 ML ORAL SUSPENSION: 12.5000 mg | Freq: Two times a day (BID) | ORAL | Status: DC

## 2017-06-05 MED ORDER — INSULIN REGULAR BOLUS VIA INFUSION
0.0000 [IU] | Freq: Three times a day (TID) | INTRAVENOUS | Status: DC
  Filled 2017-06-05: qty 10

## 2017-06-05 MED ORDER — 0.9 % SODIUM CHLORIDE (POUR BTL) OPTIME
TOPICAL | Status: DC | PRN
  Administered 2017-06-05: 5000 mL
  Administered 2017-06-05: 1000 mL

## 2017-06-05 MED ORDER — LACTATED RINGERS IV SOLN
INTRAVENOUS | Status: DC | PRN
  Administered 2017-06-05: 08:00:00 via INTRAVENOUS

## 2017-06-05 MED ORDER — DEXTROSE 5 % IV SOLN
750.0000 mg | INTRAVENOUS | Status: AC
  Administered 2017-06-05: 750 mg via INTRAVENOUS
  Filled 2017-06-05: qty 750

## 2017-06-05 MED ORDER — PROPOFOL 10 MG/ML IV BOLUS
INTRAVENOUS | Status: DC | PRN
  Administered 2017-06-05: 40 mg via INTRAVENOUS
  Administered 2017-06-05: 30 mg via INTRAVENOUS
  Administered 2017-06-05: 20 mg via INTRAVENOUS
  Administered 2017-06-05 (×2): 30 mg via INTRAVENOUS
  Administered 2017-06-05: 40 mg via INTRAVENOUS
  Administered 2017-06-05: 10 mg via INTRAVENOUS

## 2017-06-05 MED ORDER — ACETAMINOPHEN 160 MG/5ML PO SOLN: 1000.0000 mg | Freq: Four times a day (QID) | ORAL | Status: DC

## 2017-06-05 MED ORDER — CHLORHEXIDINE GLUCONATE 4 % EX LIQD: 30.0000 mL | CUTANEOUS | Status: DC

## 2017-06-05 MED ORDER — PROTAMINE SULFATE 10 MG/ML IV SOLN
INTRAVENOUS | Status: DC | PRN
  Administered 2017-06-05: 10 mg via INTRAVENOUS
  Administered 2017-06-05: 30 mg via INTRAVENOUS
  Administered 2017-06-05: 50 mg via INTRAVENOUS
  Administered 2017-06-05: 60 mg via INTRAVENOUS
  Administered 2017-06-05 (×2): 50 mg via INTRAVENOUS

## 2017-06-05 MED ORDER — LACTATED RINGERS IV SOLN
INTRAVENOUS | Status: DC
  Administered 2017-06-05: 20 mL/h via INTRAVENOUS

## 2017-06-05 MED ORDER — TRAMADOL HCL 50 MG PO TABS
50.0000 mg | ORAL_TABLET | ORAL | Status: DC | PRN
  Administered 2017-06-06 (×2): 50 mg via ORAL
  Filled 2017-06-05 (×2): qty 1

## 2017-06-05 MED ORDER — CEFUROXIME SODIUM 1.5 G IV SOLR
1.5000 g | Freq: Two times a day (BID) | INTRAVENOUS | Status: AC
  Administered 2017-06-05 – 2017-06-07 (×4): 1.5 g via INTRAVENOUS
  Filled 2017-06-05 (×4): qty 1.5

## 2017-06-05 MED ORDER — SODIUM CHLORIDE 0.9% FLUSH: 3.0000 mL | INTRAVENOUS | Status: DC | PRN

## 2017-06-05 MED ORDER — MIDAZOLAM HCL 2 MG/2ML IJ SOLN
2.0000 mg | INTRAMUSCULAR | Status: DC | PRN
  Administered 2017-06-05: 2 mg via INTRAVENOUS
  Filled 2017-06-05: qty 2

## 2017-06-05 MED ORDER — ACETAMINOPHEN 500 MG PO TABS
1000.0000 mg | ORAL_TABLET | Freq: Four times a day (QID) | ORAL | Status: DC
  Administered 2017-06-05 – 2017-06-07 (×8): 1000 mg via ORAL
  Filled 2017-06-05 (×8): qty 2

## 2017-06-05 MED ORDER — CHLORHEXIDINE GLUCONATE CLOTH 2 % EX PADS
6.0000 | MEDICATED_PAD | Freq: Every day | CUTANEOUS | Status: DC
  Administered 2017-06-05 – 2017-06-07 (×3): 6 via TOPICAL

## 2017-06-05 MED ORDER — BISACODYL 10 MG RE SUPP: 10.0000 mg | Freq: Every day | RECTAL | Status: DC

## 2017-06-05 MED ORDER — CHLORHEXIDINE GLUCONATE 0.12 % MT SOLN
15.0000 mL | OROMUCOSAL | Status: AC
  Administered 2017-06-05: 15 mL via OROMUCOSAL

## 2017-06-05 MED ORDER — SODIUM CHLORIDE 0.9% FLUSH: 10.0000 mL | INTRAVENOUS | Status: DC | PRN

## 2017-06-05 MED ORDER — PANTOPRAZOLE SODIUM 40 MG PO TBEC
40.0000 mg | DELAYED_RELEASE_TABLET | Freq: Every day | ORAL | Status: DC
  Administered 2017-06-06 – 2017-06-07 (×2): 40 mg via ORAL
  Filled 2017-06-05 (×2): qty 1

## 2017-06-05 MED ORDER — PLASMA-LYTE 148 IV SOLN
INTRAVENOUS | Status: AC
  Administered 2017-06-05: 500 mL
  Filled 2017-06-05: qty 2.5

## 2017-06-05 MED ORDER — MILRINONE LACTATE IN DEXTROSE 20-5 MG/100ML-% IV SOLN
0.1250 ug/kg/min | INTRAVENOUS | Status: DC
  Filled 2017-06-05: qty 100

## 2017-06-05 MED ORDER — EPHEDRINE SULFATE 50 MG/ML IJ SOLN
INTRAMUSCULAR | Status: DC | PRN
  Administered 2017-06-05 (×6): 5 mg via INTRAVENOUS

## 2017-06-05 MED ORDER — METOPROLOL TARTRATE 12.5 MG HALF TABLET
ORAL_TABLET | ORAL | Status: AC
  Administered 2017-06-05: 12.5 mg via ORAL
  Filled 2017-06-05: qty 1

## 2017-06-05 MED ORDER — MORPHINE SULFATE (PF) 2 MG/ML IV SOLN: 1.0000 mg | INTRAVENOUS | Status: DC | PRN

## 2017-06-05 MED ORDER — SODIUM CHLORIDE 0.45 % IV SOLN
INTRAVENOUS | Status: DC | PRN
  Administered 2017-06-05: 20 mL/h via INTRAVENOUS

## 2017-06-05 MED ORDER — MIDAZOLAM HCL 10 MG/2ML IJ SOLN
INTRAMUSCULAR | Status: AC
  Filled 2017-06-05: qty 2

## 2017-06-05 MED ORDER — ASPIRIN EC 325 MG PO TBEC
325.0000 mg | DELAYED_RELEASE_TABLET | Freq: Every day | ORAL | Status: DC
  Administered 2017-06-06 – 2017-06-07 (×2): 325 mg via ORAL
  Filled 2017-06-05 (×2): qty 1

## 2017-06-05 MED ORDER — DEXAMETHASONE SODIUM PHOSPHATE 10 MG/ML IJ SOLN
INTRAMUSCULAR | Status: AC
  Filled 2017-06-05: qty 1

## 2017-06-05 MED ORDER — VANCOMYCIN HCL IN DEXTROSE 1-5 GM/200ML-% IV SOLN
1000.0000 mg | Freq: Once | INTRAVENOUS | Status: AC
  Administered 2017-06-05: 1000 mg via INTRAVENOUS
  Filled 2017-06-05: qty 200

## 2017-06-05 MED ORDER — CHLORHEXIDINE GLUCONATE 0.12% ORAL RINSE (MEDLINE KIT): 15.0000 mL | Freq: Two times a day (BID) | OROMUCOSAL | Status: DC

## 2017-06-05 MED ORDER — FENTANYL CITRATE (PF) 250 MCG/5ML IJ SOLN
INTRAMUSCULAR | Status: AC
  Filled 2017-06-05: qty 25

## 2017-06-05 MED ORDER — POTASSIUM CHLORIDE 10 MEQ/50ML IV SOLN: 10.0000 meq | INTRAVENOUS | Status: AC

## 2017-06-05 MED ORDER — TRANEXAMIC ACID 1000 MG/10ML IV SOLN
1.5000 mg/kg/h | INTRAVENOUS | Status: DC
  Filled 2017-06-05: qty 10

## 2017-06-05 MED ORDER — ONDANSETRON HCL 4 MG/2ML IJ SOLN
INTRAMUSCULAR | Status: AC
  Filled 2017-06-05: qty 2

## 2017-06-05 MED ORDER — OXYCODONE HCL 5 MG PO TABS: 5.0000 mg | ORAL_TABLET | ORAL | Status: DC | PRN

## 2017-06-05 MED ORDER — HEPARIN SODIUM (PORCINE) 1000 UNIT/ML IJ SOLN
INTRAMUSCULAR | Status: DC | PRN
  Administered 2017-06-05: 31000 [IU] via INTRAVENOUS

## 2017-06-05 SURGICAL SUPPLY — 88 items
ADAPTER CARDIO PERF ANTE/RETRO (ADAPTER) ×3 IMPLANT
APPLICATOR COTTON TIP 6IN STRL (MISCELLANEOUS) IMPLANT
BAG DECANTER FOR FLEXI CONT (MISCELLANEOUS) ×3 IMPLANT
BLADE STERNUM SYSTEM 6 (BLADE) ×3 IMPLANT
BLADE SURG 15 STRL LF DISP TIS (BLADE) ×2 IMPLANT
BLADE SURG 15 STRL SS (BLADE) ×1
BOOT SUTURE AID YELLOW STND (SUTURE) IMPLANT
CANISTER SUCT 3000ML PPV (MISCELLANEOUS) ×3 IMPLANT
CANN PRFSN .5XCNCT 15X34-48 (MISCELLANEOUS) ×2
CANNULA GUNDRY RCSP 15FR (MISCELLANEOUS) ×3 IMPLANT
CANNULA PRFSN .5XCNCT 15X34-48 (MISCELLANEOUS) ×2 IMPLANT
CANNULA VEN 2 STAGE (MISCELLANEOUS) ×1
CATH CPB KIT GERHARDT (MISCELLANEOUS) ×3 IMPLANT
CATH HEART VENT LEFT (CATHETERS) ×2 IMPLANT
CATH RETROPLEGIA CORONARY 14FR (CATHETERS) IMPLANT
CATH THORACIC 28FR (CATHETERS) ×3 IMPLANT
CATH/SQUID NICHOLS JEHLE COR (CATHETERS) ×3 IMPLANT
CONN ST 1/4X3/8  BEN (MISCELLANEOUS) ×1
CONN ST 1/4X3/8 BEN (MISCELLANEOUS) ×2 IMPLANT
CONT SPEC 4OZ CLIKSEAL STRL BL (MISCELLANEOUS) ×6 IMPLANT
CRADLE DONUT ADULT HEAD (MISCELLANEOUS) ×3 IMPLANT
DRAIN CHANNEL 28F RND 3/8 FF (WOUND CARE) ×6 IMPLANT
DRAIN CHANNEL 32F RND 10.7 FF (WOUND CARE) IMPLANT
DRAPE CARDIOVASCULAR INCISE (DRAPES) ×1
DRAPE SLUSH/WARMER DISC (DRAPES) ×3 IMPLANT
DRAPE SRG 135X102X78XABS (DRAPES) ×2 IMPLANT
DRSG AQUACEL AG ADV 3.5X14 (GAUZE/BANDAGES/DRESSINGS) ×3 IMPLANT
ELECT BLADE 4.0 EZ CLEAN MEGAD (MISCELLANEOUS) ×3
ELECT CAUTERY BLADE 6.4 (BLADE) ×3 IMPLANT
ELECT REM PT RETURN 9FT ADLT (ELECTROSURGICAL) ×6
ELECTRODE BLDE 4.0 EZ CLN MEGD (MISCELLANEOUS) ×2 IMPLANT
ELECTRODE REM PT RTRN 9FT ADLT (ELECTROSURGICAL) ×4 IMPLANT
FELT TEFLON 1X6 (MISCELLANEOUS) ×6 IMPLANT
GAUZE SPONGE 4X4 12PLY STRL (GAUZE/BANDAGES/DRESSINGS) ×6 IMPLANT
GAUZE SPONGE 4X4 12PLY STRL LF (GAUZE/BANDAGES/DRESSINGS) ×3 IMPLANT
GLOVE BIO SURGEON STRL SZ 6 (GLOVE) ×6 IMPLANT
GLOVE BIO SURGEON STRL SZ 6.5 (GLOVE) ×39 IMPLANT
GLOVE BIOGEL PI IND STRL 6 (GLOVE) ×8 IMPLANT
GLOVE BIOGEL PI IND STRL 6.5 (GLOVE) ×2 IMPLANT
GLOVE BIOGEL PI INDICATOR 6 (GLOVE) ×4
GLOVE BIOGEL PI INDICATOR 6.5 (GLOVE) ×1
GOWN STRL REUS W/ TWL LRG LVL3 (GOWN DISPOSABLE) ×12 IMPLANT
GOWN STRL REUS W/TWL LRG LVL3 (GOWN DISPOSABLE) ×6
HEMOSTAT POWDER SURGIFOAM 1G (HEMOSTASIS) ×9 IMPLANT
HEMOSTAT SURGICEL 2X14 (HEMOSTASIS) ×3 IMPLANT
INSERT FOGARTY XLG (MISCELLANEOUS) IMPLANT
IV CATH 18G X1.75 CATHLON (IV SOLUTION) ×3 IMPLANT
KIT BASIN OR (CUSTOM PROCEDURE TRAY) ×3 IMPLANT
KIT ROOM TURNOVER OR (KITS) ×3 IMPLANT
KIT SUCTION CATH 14FR (SUCTIONS) ×9 IMPLANT
LEAD PACING MYOCARDI (MISCELLANEOUS) ×3 IMPLANT
LINE EXTENSION DELIVERY (MISCELLANEOUS) ×3 IMPLANT
LINE VENT (MISCELLANEOUS) ×3 IMPLANT
LOOP VESSEL SUPERMAXI WHITE (MISCELLANEOUS) IMPLANT
NS IRRIG 1000ML POUR BTL (IV SOLUTION) ×18 IMPLANT
PACK E OPEN HEART (SUTURE) ×3 IMPLANT
PACK OPEN HEART (CUSTOM PROCEDURE TRAY) ×3 IMPLANT
PAD ARMBOARD 7.5X6 YLW CONV (MISCELLANEOUS) ×6 IMPLANT
SEALANT SURG COSEAL 8ML (VASCULAR PRODUCTS) ×3 IMPLANT
SET CARDIOPLEGIA MPS 5001102 (MISCELLANEOUS) ×3 IMPLANT
SOLUTION ANTI FOG 6CC (MISCELLANEOUS) ×3 IMPLANT
SPONGE LAP 4X18 X RAY DECT (DISPOSABLE) ×3 IMPLANT
SUT BONE WAX W31G (SUTURE) ×3 IMPLANT
SUT ETHIBON 2 0 V 52N 30 (SUTURE) ×6 IMPLANT
SUT ETHIBOND 2 0 SH (SUTURE) ×1
SUT ETHIBOND 2 0 SH 36X2 (SUTURE) ×2 IMPLANT
SUT PROLENE 3 0 RB 1 (SUTURE) ×9 IMPLANT
SUT PROLENE 3 0 SH 1 (SUTURE) ×3 IMPLANT
SUT PROLENE 3 0 SH1 36 (SUTURE) ×3 IMPLANT
SUT PROLENE 4 0 RB 1 (SUTURE) ×5
SUT PROLENE 4-0 RB1 .5 CRCL 36 (SUTURE) ×10 IMPLANT
SUT SILK 2 0 SH CR/8 (SUTURE) ×3 IMPLANT
SUT STEEL 6MS V (SUTURE) ×3 IMPLANT
SUT STEEL SZ 6 DBL 3X14 BALL (SUTURE) ×3 IMPLANT
SUT VIC AB 1 CTX 18 (SUTURE) ×6 IMPLANT
SYSTEM SAHARA CHEST DRAIN ATS (WOUND CARE) ×3 IMPLANT
TABLE PACK (MISCELLANEOUS) ×3 IMPLANT
TAPE CLOTH SURG 4X10 WHT LF (GAUZE/BANDAGES/DRESSINGS) ×3 IMPLANT
TOWEL GREEN STERILE (TOWEL DISPOSABLE) ×3 IMPLANT
TOWEL GREEN STERILE FF (TOWEL DISPOSABLE) ×3 IMPLANT
TRAY FOLEY SILVER 14FR TEMP (SET/KITS/TRAYS/PACK) ×3 IMPLANT
TRAY FOLEY SILVER 16FR TEMP (SET/KITS/TRAYS/PACK) ×3 IMPLANT
TUBE ANAEROBIC SPECIMEN COL (MISCELLANEOUS) ×3 IMPLANT
TUBE FEEDING 8FR 16IN STR KANG (MISCELLANEOUS) ×3 IMPLANT
UNDERPAD 30X30 (UNDERPADS AND DIAPERS) ×3 IMPLANT
VALVE MAGNA EASE AORTIC 25MM (Prosthesis & Implant Heart) ×3 IMPLANT
VENT LEFT HEART 12002 (CATHETERS) ×3
WATER STERILE IRR 1000ML POUR (IV SOLUTION) ×6 IMPLANT

## 2017-06-05 NOTE — Progress Notes (Signed)
CT surgery p.m. Rounds  Patient extubated, resting comfortably Hemodynamics stable 6 hour postop labs reviewed and are satisfactory

## 2017-06-05 NOTE — Brief Op Note (Signed)
      [address]       [place] [address]             [phone]    06/05/2017  2:47 PM  PATIENT:  [name] Anderson  62 y.o. male  PRE-OPERATIVE DIAGNOSIS:  SEVERE AI, history of endocarditis   POST-OPERATIVE DIAGNOSIS:  same  PROCEDURE:  Procedure(s): AORTIC VALVE REPLACEMENT (AVR) using 49mm Magna Ease valve (N/A) TRANSESOPHAGEAL ECHOCARDIOGRAM (TEE) (N/A)  SURGEON:  Surgeon(s) and Role:    * [name], MD - Primary  PHYSICIAN ASSISTANT:  Nicholes Rough, PA-C  ANESTHESIA:   general  EBL:  800 mL   BLOOD ADMINISTERED:none  DRAINS: routine    LOCAL MEDICATIONS USED:  NONE  SPECIMEN:  Source of Specimen:  vegetation, aortic valve leaflets  DISPOSITION OF SPECIMEN:  PATHOLOGY  COUNTS:  YES  DICTATION: .Dragon Dictation  PLAN OF CARE: Admit to inpatient   PATIENT DISPOSITION:  ICU - intubated and hemodynamically stable.   Delay start of Pharmacological VTE agent (>24hrs) due to surgical blood loss or risk of bleeding: yes

## 2017-06-05 NOTE — Anesthesia Preprocedure Evaluation (Signed)
Anesthesia Evaluation  Patient identified by MRN, date of birth, ID band Patient awake    Reviewed: Allergy & Precautions, NPO status , Patient's Chart, lab work & pertinent test results  History of Anesthesia Complications Negative for: history of anesthetic complications  Airway Mallampati: III  TM Distance: <3 FB Neck ROM: Full    Dental  (+) Teeth Intact   Pulmonary sleep apnea , Current Smoker,    breath sounds clear to auscultation       Cardiovascular + Peripheral Vascular Disease  + Valvular Problems/Murmurs AI  Rhythm:Regular     Neuro/Psych negative neurological ROS  negative psych ROS   GI/Hepatic negative GI ROS, Neg liver ROS,   Endo/Other  negative endocrine ROS  Renal/GU Renal disease     Musculoskeletal negative musculoskeletal ROS (+)   Abdominal   Peds  Hematology negative hematology ROS (+)   Anesthesia Other Findings   Reproductive/Obstetrics                             Anesthesia Physical Anesthesia Plan  ASA: IV  Anesthesia Plan: General   Post-op Pain Management:    Induction: Intravenous  PONV Risk Score and Plan: 1 and Treatment may vary due to age or medical condition  Airway Management Planned: Oral ETT  Additional Equipment: Arterial line, CVP, PA Cath, TEE and Ultrasound Guidance Line Placement  Intra-op Plan:   Post-operative Plan: Post-operative intubation/ventilation  Informed Consent: I have reviewed the patients History and Physical, chart, labs and discussed the procedure including the risks, benefits and alternatives for the proposed anesthesia with the patient or authorized representative who has indicated his/her understanding and acceptance.   Dental advisory given  Plan Discussed with: CRNA and Surgeon  Anesthesia Plan Comments:         Anesthesia Quick Evaluation

## 2017-06-05 NOTE — Anesthesia Procedure Notes (Addendum)
Central Venous Catheter Insertion Performed by: Roberts Gaudy, MD, anesthesiologist Start/End2/09/2017 7:50 AM, 06/05/2017 8:00 AM Patient location: Pre-op. Preanesthetic checklist: patient identified, IV checked, site marked, risks and benefits discussed, surgical consent, monitors and equipment checked, pre-op evaluation, timeout performed and anesthesia consent Lidocaine 1% used for infiltration and patient sedated Hand hygiene performed  and maximum sterile barriers used  Catheter size: 8.5 Fr Sheath introducer Procedure performed using ultrasound guided technique. Ultrasound Notes:anatomy identified, needle tip was noted to be adjacent to the nerve/plexus identified, no ultrasound evidence of intravascular and/or intraneural injection and image(s) printed for medical record Attempts: 1 Following insertion, line sutured and dressing applied. Post procedure assessment: blood return through all ports, free fluid flow and no air  Patient tolerated the procedure well with no immediate complications.

## 2017-06-05 NOTE — Anesthesia Procedure Notes (Addendum)
Central Venous Catheter Insertion Performed by: Roberts Gaudy, MD, anesthesiologist Start/End2/09/2017 7:50 AM, 06/05/2017 8:00 AM Patient location: Pre-op. Preanesthetic checklist: patient identified, IV checked, site marked, risks and benefits discussed, surgical consent, monitors and equipment checked, pre-op evaluation, timeout performed and anesthesia consent Lidocaine 1% used for infiltration and patient sedated Hand hygiene performed  and maximum sterile barriers used  Catheter size: 8 Fr Total catheter length 16. PA cath was placed.Double lumen Swan type:thermodilution Procedure performed without using ultrasound guided technique. Ultrasound Notes:anatomy identified, needle tip was noted to be adjacent to the nerve/plexus identified, no ultrasound evidence of intravascular and/or intraneural injection and image(s) printed for medical record Attempts: 1 Following insertion, line sutured, dressing applied and Biopatch. Post procedure assessment: blood return through all ports  Patient tolerated the procedure well with no immediate complications.

## 2017-06-05 NOTE — Anesthesia Postprocedure Evaluation (Signed)
Anesthesia Post Note  Patient: [name] Anderson  Procedure(s) Performed: AORTIC VALVE REPLACEMENT (AVR) using 38mm Magna Ease valve (N/A ) TRANSESOPHAGEAL ECHOCARDIOGRAM (TEE) (N/A )     Patient location during evaluation: SICU Anesthesia Type: General Level of consciousness: sedated Pain management: pain level controlled Vital Signs Assessment: post-procedure vital signs reviewed and stable Respiratory status: patient remains intubated per anesthesia plan Cardiovascular status: stable Postop Assessment: no apparent nausea or vomiting Anesthetic complications: no    Last Vitals:  Vitals:   06/05/17 1637 06/05/17 1714  BP:  (!) 87/75  Pulse: 82 81  Resp: 16 19  Temp: 36.9 C   SpO2: 99% 99%    Last Pain:  Vitals:   06/05/17 1600  TempSrc: Core                 [name] Anderson

## 2017-06-05 NOTE — Anesthesia Procedure Notes (Signed)
Procedure Name: Intubation Date/Time: 06/05/2017 10:27 AM Performed by: Teressa Lower., CRNA Pre-anesthesia Checklist: Patient identified, Emergency Drugs available, Suction available and Patient being monitored Patient Re-evaluated:Patient Re-evaluated prior to induction Oxygen Delivery Method: Circle system utilized Preoxygenation: Pre-oxygenation with 100% oxygen Induction Type: IV induction Ventilation: Mask ventilation without difficulty and Oral airway inserted - appropriate to patient size Laryngoscope Size: Glidescope and 3 Grade View: Grade I Tube type: Oral Tube size: 8.0 mm Number of attempts: 1 Airway Equipment and Method: Stylet and Oral airway Placement Confirmation: ETT inserted through vocal cords under direct vision,  positive ETCO2 and breath sounds checked- equal and bilateral Secured at: 24 cm Tube secured with: Tape Dental Injury: Teeth and Oropharynx as per pre-operative assessment

## 2017-06-05 NOTE — H&P (Signed)
[address]       [place] [address]             [phone]                    [name] Anderson [facility] Medical Record #[id number] Date of Birth: 1956-01-11  Referring: Dr Stanford Breed  Primary Care: Antony Contras, MD Primary Cardiologist: No primary care provider on file.  Chief Complaint:    AI and endocarditis    History of Present Illness:    [name] Anderson 62 y.o. male followed in the office . He now presents  For aortic root replacement and aortic valve replacement.  Post treatment blood cultures were negative ,  sed rate was elevated to 70 C-reactive protein normal at 2.9.  The patient does have chronic pain in both hips right slightly more than the left. MRI of hips done last week - negative for evidence of infection.    The patient has a known previous cardiac history of aortic insufficiency and mildly dilated ascending aorta.  The patient has been followed for several years with serial echocardiograms and CTA of the chest.  In Nov 2018   weeks ago echocardiogram was done suggesting increased  aortic insufficiency.  Repeat CTA of the chest suggested possible splenic infarcts.  TEE was performed because of these findings and suggested possible endocarditis, with increased aortic insufficiency and possible vegetation.  Although the patient did not present because of symptoms, in retrospect he had  felt more fatigued over the past several months, but denies any specific infections, denies fever and chills, has had no dental recent dental work, he has been noted to be anemic and recent upper GI endoscopy and colonoscopy were performed without specific causes of the anemia.   Following the TEE the patient was admitted to 3 blood cultures were obtained and grew STAPHYLOCOCCUS SPECIES (COAGULASE NEGATIVE Other than fatigue the patient has no significant symptoms of congestive heart failure, he was with a MRI of the brain in early November for 73-monthhistory of dizziness.   MRI was negative for stroke March 12, 2017.  Patient has completed 6 weeks of IV ceFAZolin (ANCEF) IV on January 16.  .  Since discharge from the hospital patient has had a cardiac catheterization that showed no significant coronary artery disease.  He notes that overall he feels better denies fever chills, shortness of breath is improved.     Current Activity/ Functional Status:  Patient is independent with mobility/ambulation, transfers, ADL's, IADL's.   Zubrod Score: At the time of surgery this patient's most appropriate activity status/level should be described as: _0     0    Normal activity, no symptoms _1     1    Restricted in physical strenuous activity but ambulatory, able to do out light work _2     2    Ambulatory and capable of self care, unable to do work activities, up and about               >50 % of waking hours                              _3     3    Only limited self care, in bed greater than 50% of waking hours _4     4    Completely disabled, no self care, confined to bed or chair _5   5    Moribund   Past Medical History:  Diagnosis Date  . Allergy   . Anemia   . Barrett's esophagus - short segment 03/13/2017  . Cancer ([facility]) 09/11/13   testicular  . Complication of anesthesia    hard to wake  - 20 years ago  . ED (erectile dysfunction)   . Endocarditis    Ar[facility] Endo 03/29/2017  . H/O aneurysm 03/05/2017  . High cholesterol   . History of basal cell carcinoma excision    02/2002   --  NOSE  &   2013  RIGHT EAR  . History of kidney stones   . Hyperlipidemia   . Mild obstructive sleep apnea    PER STUDY 01-18-2005--  NO CPAP OR MOUTH GUARD  . Moderate aortic valve insufficiency   . Multiple pulmonary nodules    PER CT--  PROBABLE  GRANULOMATOUS - patient is not aware of this  . Personal history of colonic polyps    TUBULAR ADENOMA--   . Seasonal allergies   . 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[facility])   . Testicular mass    RIGHT    Past Surgical History:    Procedure Laterality Date  . COLONOSCOPY  01/24/06, 07/10/11  . COLONOSCOPY WITH ESOPHAGOGASTRODUODENOSCOPY (EGD)    . EXTRACORPOREAL SHOCK WAVE LITHOTRIPSY    . HERNIA REPAIR    . MOHS SURGERY  02/2002  &  2013   TIP OF NOSE-///     RIGHT EAR  . ORCHIECTOMY Right 09/11/2013   Procedure:  RIGHT ORCHIECTOMY;  Surgeon: Jorja Loa, MD;  Location: [facility];  Service: Urology;  Laterality: Right;  . RIGHT/LEFT HEART CATH AND CORONARY ANGIOGRAPHY N/A 04/18/2017   Procedure: RIGHT/LEFT HEART CATH AND CORONARY ANGIOGRAPHY;  Surgeon: Sherren Mocha, MD;  Location: [facility];  Service: Cardiovascular;  Laterality: N/A;  . SCROTAL EXPLORATION Right 09/11/2013   Procedure: RIGHT INGUINAL EXPLORATION;  Surgeon: Jorja Loa, MD;  Location: [facility];  Service: Urology;  Laterality: Right;  . TEE WITHOUT CARDIOVERSION N/A 03/29/2017   Procedure: TRANSESOPHAGEAL ECHOCARDIOGRAM (TEE);  Surgeon: Lelon Perla, MD;  Location: [facility] ENDOSCOPY;  Service: Cardiovascular;  Laterality: N/A;  . TRANSTHORACIC ECHOCARDIOGRAM  02-25-2013   DR CRENSHAW   NORMAL LVSF/  EF 55-60%/   GRADE 2 DIASTOLIC DYSFUNCTION/  ECCENTRIC MODERATE AORTIC INSUFFICIENCY WITHOUT STENOSIS/  MILD DILATED AORTIC ROOT/ TRIVIAL MR, TR,  & PR  . UMBILICAL HERNIA REPAIR  06-08-2009  . URETEROSCOPIC LASER LITHOTRIPSY STONE EXTRACTION  2007    Family History  Problem Relation Age of Onset  . Colon cancer Father   . Heart disease Paternal Grandmother     Social History   Socioeconomic History  . Marital status: Married    Spouse name: Coralyn Mark  . Number of children: 2  . Years of education: 48  . Highest education level: Not on file  Social Needs  . Financial resource strain: Not on file  . Food insecurity - worry: Not on file  . Food insecurity - inability: Not on file  . Transportation needs - medical: Not on file  . Transportation needs - non-medical: Not on file   Occupational History  . 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 Smoking status: Current Some Day Smoker    Types: Pipe  . Smokeless tobacco: Former Systems developer    Types: [place] date: 04/26/2011  . Tobacco comment: OCCASIONAL  PIPE SMOKER  (NEVER SMOKED CIGARETTES)  Substance and Sexual Activity  . Alcohol use: Yes    Comment: 03/29/2017 "might have a glass of wine twice/year"  . Drug use: No  . Sexual activity: Yes  Other Topics Concern  . Not on file  Social History Narrative   Lives with wife Coralyn Mark   Caffeine use: coffee- 1 cup per day    Social History   Tobacco Use  Smoking Status Current Some Day Smoker  . Types: Pipe  Smokeless Tobacco Former Systems developer  . Types: Chew  . Quit date: 04/26/2011  Tobacco Comment   OCCASIONAL  PIPE SMOKER  (NEVER SMOKED CIGARETTES)    Social History   Substance and Sexual Activity  Alcohol Use Yes   Comment: 03/29/2017 "might have a glass of wine twice/year"     No Known Allergies  Current Facility-Administered Medications  Medication Dose Route Frequency Provider Last Rate Last Dose  . cefUROXime (ZINACEF) 1.5 g in dextrose 5 % 50 mL IVPB  1.5 g Intravenous To OR [name], MD      . chlorhexidine (HIBICLENS) 4 % liquid 2 application  30 mL Topical UD [name], MD      . dexmedetomidine (PRECEDEX) 400 MCG/100ML (4 mcg/mL) infusion  0.1-0.7 mcg/kg/hr Intravenous To OR [name], MD      . DOPamine (INTROPIN) 800 mg in dextrose 5 % 250 mL (3.2 mg/mL) infusion  0-10 mcg/kg/min Intravenous To OR [name], MD      . EPINEPHrine (ADRENALIN) 4 mg in dextrose 5 % 250 mL (0.016 mg/mL) infusion  0-10 mcg/min Intravenous To OR [name], MD      . heparin 2,500 Units, papaverine 30 mg in electrolyte-148 (PLASMALYTE-148) 500 mL irrigation   Irrigation To OR [name], MD      . heparin 30,000 units/NS 1000 mL solution for CELLSAVER   Other To OR [name], MD      . insulin  regular (NOVOLIN R,HUMULIN R) 100 Units in sodium chloride 0.9 % 100 mL (1 Units/mL) infusion   Intravenous To OR [name], MD      . magnesium sulfate (IV Push/IM) injection 40 mEq  40 mEq Other To OR [name], MD      . milrinone (PRIMACOR) 20 MG/100 ML (0.2 mg/mL) infusion  0.125 mcg/kg/min Intravenous To OR [name], MD      . nitroGLYCERIN 50 mg in dextrose 5 % 250 mL (0.2 mg/mL) infusion  2-200 mcg/min Intravenous To OR [name], MD      . norepinephrine (LEVOPHED) 4 mg in dextrose 5 % 250 mL (0.016 mg/mL) infusion  0-10 mcg/min Intravenous To OR [name], MD      . phenylephrine (NEO-SYNEPHRINE) 20 mg in sodium chloride 0.9 % 250 mL (0.08 mg/mL) infusion  30-200 mcg/min Intravenous To OR [name], MD      . potassium chloride injection 80 mEq  80 mEq Other To OR [name], MD      . tranexamic acid (CYKLOKAPRON) 2,500 mg in sodium chloride 0.9 % 250 mL (10 mg/mL) infusion  1.5 mg/kg/hr Intravenous To OR [name], MD      . tranexamic acid (CYKLOKAPRON) bolus via infusion - over 30 minutes 1,422 mg  15 mg/kg Intravenous To OR [name], MD      . tranexamic acid (CYKLOKAPRON) pump prime solution 190 mg  2 mg/kg Intracatheter To OR [name], MD      .  vancomycin (VANCOCIN) 1,500 mg in sodium chloride 0.9 % 250 mL IVPB  1,500 mg Intravenous To OR [name], MD       Facility-Administered Medications Ordered in Other Encounters  Medication Dose Route Frequency Provider Last Rate Last Dose  . fentaNYL (SUBLIMAZE) injection   Intravenous Anesthesia Intra-op Mesaros, Gerline Legacy, RN   50 mcg at 06/05/17 0730  . lactated ringers infusion    Continuous PRN Mesaros, Gerline Legacy, RN      . lactated ringers infusion    Continuous PRN Mesaros, Gerline Legacy, RN      . midazolam (VERSED) 5 MG/5ML injection    Anesthesia Intra-op Mesaros, Gerline Legacy, RN   1 mg at 06/05/17 2409    Pertinent items are  noted in HPI.   Review of Systems:  Review of Systems  Constitutional: Positive for malaise/fatigue. Negative for chills, diaphoresis, fever and weight loss.  HENT: Negative.   Eyes: Negative.   Respiratory: Negative.   Cardiovascular: Positive for palpitations. Negative for chest pain, orthopnea, claudication, leg swelling and PND.  Gastrointestinal: Negative.   Genitourinary: Negative.   Musculoskeletal: Negative.   Skin: Negative.   Neurological: Negative.  Negative for weakness.  Endo/Heme/Allergies: Negative.   Psychiatric/Behavioral: Negative.     Physical Exam: BP (!) 163/57   Pulse 90   Temp 97.6 F (36.4 C) (Oral)   Resp 18   Wt 209 lb (94.8 kg)   SpO2 97%   BMI 29.15 kg/m   PHYSICAL EXAMINATION: General appearance: alert, cooperative, appears older than stated age and no distress Head: Normocephalic, without obvious abnormality, atraumatic Neck: no adenopathy, no carotid bruit, no JVD, supple, symmetrical, trachea midline and thyroid not enlarged, symmetric, no tenderness/mass/nodules Lymph nodes: Cervical, supraclavicular, and axillary nodes normal. Resp: clear to auscultation bilaterally Back: symmetric, no curvature. ROM normal. No CVA tenderness. Cardio: diastolic murmur: holodiastolic 3/6, decrescendo throughout the precordium GI: soft, non-tender; bowel sounds normal; no masses,  no organomegaly Extremities: extremities normal, atraumatic, no cyanosis or edema, Homans sign is negative, no sign of DVT and No evidence of peripheral embolization Neurologic: Grossly normal  Diagnostic Studies & Laboratory data:     Recent Radiology Findings:   Dg Chest 2 View  Result Date: 05/30/2017 CLINICAL DATA:  AVR replacement. EXAM: CHEST  2 VIEW COMPARISON:  03/29/2017. FINDINGS: Mediastinum and hilar structures normal. Lungs are clear. No pleural effusion or pneumothorax. Heart size normal. Degenerative changes thoracic spine. IMPRESSION: No acute cardiopulmonary  disease. Electronically Signed   By: Marcello Moores  Register   On: 05/30/2017 16:44   Mr Hip Right Wo Contrast  Result Date: 06/01/2017 CLINICAL DATA:  Bilateral hip pain, right greater than left for 6 months. History of testicular cancer. EXAM: MR OF THE RIGHT HIP WITHOUT CONTRAST MR OF THE LEFT  HIP WITHOUT CONTRAST TECHNIQUE: Multiplanar, multisequence MR imaging was performed. No intravenous contrast was administered. COMPARISON:  None. FINDINGS: Bones: No hip fracture, dislocation or avascular necrosis. No periosteal reaction or bone destruction. No aggressive osseous lesion. Normal sacrum and sacroiliac joints. No SI joint widening or erosive changes. Articular cartilage and labrum Articular cartilage:  No chondral defect. Labrum:  Limited evaluation secondary lack of intra-articular fluid. Joint or bursal effusion Joint effusion:  No hip joint effusion.  No SI joint effusion. Bursae:  No bursa formation. Muscles and tendons Flexors: Normal. Extensors: Normal. Abductors: Normal. Adductors: Normal. Gluteals: Normal. Hamstrings: Normal. Other findings Miscellaneous: No pelvic free fluid. No fluid collection or hematoma. No inguinal lymphadenopathy. No inguinal  hernia. Prior right orchiectomy. IMPRESSION: 1. No hip fracture, dislocation or avascular necrosis of bilateral hips. 2. No evidence of septic arthritis of bilateral hips. Electronically Signed   By: Kathreen Devoid   On: 06/01/2017 14:05   Mr Hip Left Wo Contrast  Result Date: 06/01/2017 CLINICAL DATA:  Bilateral hip pain, right greater than left for 6 months. History of testicular cancer. EXAM: MR OF THE RIGHT HIP WITHOUT CONTRAST MR OF THE LEFT  HIP WITHOUT CONTRAST TECHNIQUE: Multiplanar, multisequence MR imaging was performed. No intravenous contrast was administered. COMPARISON:  None. FINDINGS: Bones: No hip fracture, dislocation or avascular necrosis. No periosteal reaction or bone destruction. No aggressive osseous lesion. Normal sacrum and sacroiliac  joints. No SI joint widening or erosive changes. Articular cartilage and labrum Articular cartilage:  No chondral defect. Labrum:  Limited evaluation secondary lack of intra-articular fluid. Joint or bursal effusion Joint effusion:  No hip joint effusion.  No SI joint effusion. Bursae:  No bursa formation. Muscles and tendons Flexors: Normal. Extensors: Normal. Abductors: Normal. Adductors: Normal. Gluteals: Normal. Hamstrings: Normal. Other findings Miscellaneous: No pelvic free fluid. No fluid collection or hematoma. No inguinal lymphadenopathy. No inguinal hernia. Prior right orchiectomy. IMPRESSION: 1. No hip fracture, dislocation or avascular necrosis of bilateral hips. 2. No evidence of septic arthritis of bilateral hips. Electronically Signed   By: Kathreen Devoid   On: 06/01/2017 14:05     I have independently reviewed the above radiologic studies.  Recent Lab Findings: Lab Results  Component Value Date   WBC 9.6 05/30/2017   HGB 14.8 05/30/2017   HCT 42.5 05/30/2017   PLT 182 05/30/2017   GLUCOSE 96 05/30/2017   CHOL 165 03/30/2017   TRIG 91 03/30/2017   HDL 24 (L) 03/30/2017   LDLDIRECT 160.9 02/12/2013   LDLCALC 123 (H) 03/30/2017   ALT 13 (L) 05/30/2017   AST 19 05/30/2017   NA 137 05/30/2017   K 4.3 05/30/2017   CL 108 05/30/2017   CREATININE 0.96 05/30/2017   BUN 17 05/30/2017   CO2 18 (L) 05/30/2017   TSH 3.911 03/29/2017   INR 1.04 05/30/2017   HGBA1C 5.2 05/30/2017   Recent Lab Findings:  Micro:         Results for orders placed or performed during the hospital encounter of 03/29/17  Culture, blood (routine x 2) Status: Abnormal   Collection Time: 03/29/17 12:30 PM  Result Value Ref Range Status   Specimen Description BLOOD RIGHT ANTECUBITAL  Final   Special Requests   Final    BOTTLES DRAWN AEROBIC ONLY Blood Culture adequate volume   Culture Setup Time   Final    GRAM POSITIVE COCCI IN CLUSTERS  AEROBIC BOTTLE ONLY  CRITICAL VALUE NOTED. VALUE IS CONSISTENT  WITH PREVIOUSLY REPORTED AND CALLED VALUE.    Culture (A)  Final    STAPHYLOCOCCUS SPECIES (COAGULASE NEGATIVE)  SUSCEPTIBILITIES PERFORMED ON PREVIOUS CULTURE WITHIN THE LAST 5 DAYS.    Report Status 04/02/2017 FINAL  Final  Culture, blood (routine x 2) Status: Abnormal   Collection Time: 03/29/17 12:32 PM  Result Value Ref Range Status   Specimen Description BLOOD LEFT HAND  Final   Special Requests   Final    BOTTLES DRAWN AEROBIC ONLY Blood Culture adequate volume   Culture Setup Time   Final    GRAM POSITIVE COCCI IN CLUSTERS  AEROBIC BOTTLE ONLY  CRITICAL RESULT CALLED TO, READ BACK BY AND VERIFIED WITH: A. JOHNSTON,PHARMD 2056 03/30/2017 T. Rosalia Hammers  Culture STAPHYLOCOCCUS SPECIES (COAGULASE NEGATIVE) (A)  Final   Report Status 04/02/2017 FINAL  Final   Organism ID, Bacteria STAPHYLOCOCCUS SPECIES (COAGULASE NEGATIVE)  Final  Susceptibility   Staphylococcus species (coagulase negative) - MIC*    CIPROFLOXACIN <=0.5 SENSITIVE Sensitive     ERYTHROMYCIN >=8 RESISTANT Resistant     GENTAMICIN <=0.5 SENSITIVE Sensitive     OXACILLIN <=0.25 SENSITIVE Sensitive     TETRACYCLINE <=1 SENSITIVE Sensitive     VANCOMYCIN 1 SENSITIVE Sensitive     TRIMETH/SULFA <=10 SENSITIVE Sensitive     CLINDAMYCIN <=0.25 SENSITIVE Sensitive     RIFAMPIN <=0.5 SENSITIVE Sensitive     Inducible Clindamycin NEGATIVE Sensitive    * STAPHYLOCOCCUS SPECIES (COAGULASE NEGATIVE)  Blood Culture ID Panel (Reflexed) Status: Abnormal   Collection Time: 03/29/17 12:32 PM  Result Value Ref Range Status   Enterococcus species NOT DETECTED NOT DETECTED Final   Listeria monocytogenes NOT DETECTED NOT DETECTED Final   Staphylococcus species DETECTED (A) NOT DETECTED Final    Comment: Methicillin (oxacillin) susceptible coagulase negative staphylococcus. Possible blood culture contaminant (unless isolated from more than one blood culture draw or clinical case suggests pathogenicity). No antibiotic treatment is  indicated for blood  culture contaminants.  CRITICAL RESULT CALLED TO, READ BACK BY AND VERIFIED WITH:  A. JOHNSTON,PHARMD 2056 03/30/2017 T. TYSOR    Staphylococcus aureus NOT DETECTED NOT DETECTED Final   Methicillin resistance NOT DETECTED NOT DETECTED Final   Streptococcus species NOT DETECTED NOT DETECTED Final   Streptococcus agalactiae NOT DETECTED NOT DETECTED Final   Streptococcus pneumoniae NOT DETECTED NOT DETECTED Final   Streptococcus pyogenes NOT DETECTED NOT DETECTED Final   Acinetobacter baumannii NOT DETECTED NOT DETECTED Final   Enterobacteriaceae species NOT DETECTED NOT DETECTED Final   Enterobacter cloacae complex NOT DETECTED NOT DETECTED Final   Escherichia coli NOT DETECTED NOT DETECTED Final   Klebsiella oxytoca NOT DETECTED NOT DETECTED Final   Klebsiella pneumoniae NOT DETECTED NOT DETECTED Final   Proteus species NOT DETECTED NOT DETECTED Final   Serratia marcescens NOT DETECTED NOT DETECTED Final   Haemophilus influenzae NOT DETECTED NOT DETECTED Final   Neisseria meningitidis NOT DETECTED NOT DETECTED Final   Pseudomonas aeruginosa NOT DETECTED NOT DETECTED Final   Candida albicans NOT DETECTED NOT DETECTED Final   Candida glabrata NOT DETECTED NOT DETECTED Final   Candida krusei NOT DETECTED NOT DETECTED Final   Candida parapsilosis NOT DETECTED NOT DETECTED Final   Candida tropicalis NOT DETECTED NOT DETECTED Final  Culture, blood (routine x 2) Status: Abnormal   Collection Time: 03/29/17 12:35 PM  Result Value Ref Range Status   Specimen Description BLOOD RIGHT HAND  Final   Special Requests   Final    BOTTLES DRAWN AEROBIC ONLY Blood Culture adequate volume   Culture Setup Time   Final    GRAM POSITIVE COCCI IN CLUSTERS  AEROBIC BOTTLE ONLY  CRITICAL VALUE NOTED. VALUE IS CONSISTENT WITH PREVIOUSLY REPORTED AND CALLED VALUE.    Culture (A)  Final    STAPHYLOCOCCUS SPECIES (COAGULASE NEGATIVE)  SUSCEPTIBILITIES PERFORMED ON PREVIOUS CULTURE  WITHIN THE LAST 5 DAYS.    Report Status 04/02/2017 FINAL  Final  Culture, blood (routine x 2) Status: Abnormal   Collection Time: 03/29/17 9:17 PM  Result Value Ref Range Status   Specimen Description BLOOD LEFT ANTECUBITAL  Final   Special Requests Blood Culture adequate volume IN PEDIATRIC BOTTLE  Final   Culture Setup Time   Final    GRAM  POSITIVE COCCI  IN PEDIATRIC BOTTLE  CRITICAL VALUE NOTED. VALUE IS CONSISTENT WITH PREVIOUSLY REPORTED AND CALLED VALUE.    Culture (A)  Final    STAPHYLOCOCCUS SPECIES (COAGULASE NEGATIVE)  SUSCEPTIBILITIES PERFORMED ON PREVIOUS CULTURE WITHIN THE LAST 5 DAYS.    Report Status 04/02/2017 FINAL  Final  Culture, blood (routine x 2) Status: Abnormal   Collection Time: 03/29/17 9:21 PM  Result Value Ref Range Status   Specimen Description BLOOD LEFT HAND  Final   Special Requests Blood Culture adequate volume IN PEDIATRIC BOTTLE  Final   Culture Setup Time   Final    GRAM POSITIVE COCCI IN CLUSTERS  IN PEDIATRIC BOTTLE  CRITICAL VALUE NOTED. VALUE IS CONSISTENT WITH PREVIOUSLY REPORTED AND CALLED VALUE.    Culture (A)  Final    STAPHYLOCOCCUS SPECIES (COAGULASE NEGATIVE)  SUSCEPTIBILITIES PERFORMED ON PREVIOUS CULTURE WITHIN THE LAST 5 DAYS.    Report Status 04/02/2017 FINAL  Final  Culture, blood (routine x 2) Status: None   Collection Time: 04/01/17 5:30 AM  Result Value Ref Range Status   Specimen Description BLOOD RIGHT ANTECUBITAL  Final   Special Requests   Final    BOTTLES DRAWN AEROBIC AND ANAEROBIC Blood Culture adequate volume   Culture NO GROWTH 5 DAYS  Final   Report Status 04/06/2017 FINAL  Final  Culture, blood (routine x 2) Status: None   Collection Time: 04/01/17 5:35 AM  Result Value Ref Range Status   Specimen Description BLOOD LEFT ANTECUBITAL  Final   Special Requests   Final    BOTTLES DRAWN AEROBIC AND ANAEROBIC Blood Culture adequate volume   Culture NO GROWTH 5 DAYS  Final   Report Status 04/06/2017 FINAL  Final     Recent Radiology Findings:  Mr Virgel Paling YQ Contrast  Result Date: 03/12/2017  CLINICAL DATA: Dizziness for 3 months. Hypertension. EXAM: MRA HEAD WITHOUT CONTRAST TECHNIQUE: Angiographic images of the Circle of Willis were obtained using MRA technique without intravenous contrast. COMPARISON: None. FINDINGS: Anterior circulation: Slightly smaller right ICA in the setting of relative right A1 hypoplasia. Robust anterior communicating artery. Vessels are smooth and widely patent. Negative for aneurysm. Posterior circulation: Left dominant vertebral artery. There is symmetric loss of signal in the proximal V4 segments and picas due to partial horizontal course. There is antegrade flow in both vertebral arteries on recent carotid Doppler. The vessels are smooth and widely patent. A small left posterior communicating artery is present. Negative for aneurysm. IMPRESSION: Normal intracranial MRA. Electronically Signed By: Monte Fantasia M.D. On: 03/12/2017 09:00  Ct Angio Chest Aorta W &/or Wo Contrast  Result Date: 03/19/2017  CLINICAL DATA: 62 year old male with a history of thoracic aortic aneurysm EXAM: CT ANGIOGRAPHY CHEST WITH CONTRAST TECHNIQUE: Multidetector CT imaging of the chest was performed using the standard protocol during bolus administration of intravenous contrast. Multiplanar CT image reconstructions and MIPs were obtained to evaluate the vascular anatomy. CONTRAST: 136m ISOVUE-370 IOPAMIDOL (ISOVUE-370) INJECTION 76% COMPARISON: Prior CTA of the chest 03/29/2016 FINDINGS: Cardiovascular: Stable mild aneurysmal dilatation of the aortic root. Precise measurements are challenging giving artifact related to cardiac motion, however the maximal diameter does not exceed 4.7 cm as previously measured. In fact, I suspect the true measurement to be closer to 4.4 cm. The tubular portion of the ascending thoracic aorta remains stable at 3.8 cm. The transverse and descending thoracic aorta are normal in  caliber. No significant aortic plaque. Mild left ventricular dilatation. No thrombus visualized within the cardiac chambers or  left atrial appendage. Mediastinum/Nodes: Unremarkable CT appearance of the thyroid gland. No suspicious mediastinal or hilar adenopathy. No soft tissue mediastinal mass. The thoracic esophagus is unremarkable. Lungs/Pleura: Lungs are clear. No pleural effusion or pneumothorax. Upper Abdomen: Interval development of multiple wedge-shaped regions of low attenuation in the superior aspect of the spleen as well as an approximately 6.6 x 5.2 cm low-attenuation region in the inferior aspect of the spleen. These findings are new compared to prior imaging. Musculoskeletal: No chest wall abnormality. No acute or significant osseous findings. Review of the MIP images confirms the above findings. IMPRESSION: 1. Stable mild aneurysmal dilatation of the aortic root which is no larger than 4.7 cm. I suspect the 4.7 cm measurement may be slightly exaggerated by cardiac motion artifact which is been present across multiple prior studies. The true measurement of the aortic root is likely closer to 4.4 cm. Consider cardiac gated CTA of the next annual follow-up evaluation. 2. Interval development of multiple wedge-shaped regions of low attenuation in the superior aspect of the spleen as well as a new 6.6 cm low-attenuation region in the inferior aspect of the spleen. 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 There is no significant or irregular atherosclerotic plaque in the thoracic, or upper abdominal aorta to serve as a donor site for emboli. Does the patient have a clinical history of atrial fibrillation? Has the patient experienced episodes of left upper quadrant pain over the past several weeks or months? 3. Left ventricular dilatation. Aortic aneurysm NOS (ICD10-I71.9). Signed, Criselda Peaches, MD [facility] Radiology  Electronically Signed By: Jacqulynn Cadet M.D. On: 03/19/2017 10:07  I have independently reviewed the above radiology studies and reviewed the findings with the patient.  CATH: Procedures  RIGHT/LEFT HEART CATH AND CORONARY ANGIOGRAPHY  Conclusion  1. Widely patent coronary arteries (right dominant)  2. Normal right and left heart pressures  Coronary Findings  Diagnostic  Dominance: Right  Left Main  Vessel is angiographically normal.  Left Anterior Descending  Vessel is angiographically normal.  First Diagonal Branch  The LAD is patent to the LV apex without obstructive disease.  Left Circumflex  Large vessel, angiographically normal.  Second Obtuse Marginal Branch  Vessel is angiographically normal.  Right Coronary Artery  Vessel is angiographically normal. Large, dominant vessel, angiographically normal  Hemodynamic Data    Most Recent Value  Fick Cardiac Output 6.78 L/min  Fick Cardiac Output Index 3.32 (L/min)/BSA  RA A Wave 6 mmHg  RA V Wave 4 mmHg  RA Mean 5 mmHg  RV Systolic Pressure 33 mmHg  RV Diastolic Pressure 3 mmHg  RV EDP 9 mmHg  PA Systolic Pressure 29 mmHg  PA Diastolic Pressure 12 mmHg  PA Mean 19 mmHg  PW A Wave 12 mmHg  PW V Wave 12 mmHg  PW Mean 9 mmHg  AO Systolic Pressure 947 mmHg  AO Diastolic Pressure 54 mmHg  AO Mean 80 mmHg  LV Systolic Pressure 096 mmHg  LV Diastolic Pressure 9 mmHg  LV EDP 15 mmHg  Arterial Occlusion Pressure Extended Systolic Pressure 283 mmHg  Arterial Occlusion Pressure Extended Diastolic Pressure 60 mmHg  Arterial Occlusion Pressure Extended Mean Pressure 82 mmHg  Left Ventricular Apex Extended Systolic Pressure 662 mmHg  Left Ventricular Apex Extended Diastolic Pressure 8 mmHg  Left Ventricular Apex Extended EDP Pressure 14 mmHg  QP/QS 0.96  TPVR Index 5.96 HRUI  TSVR Index 24.09 HRUI  PVR SVR Ratio 0.14  TPVR/TSVR  Ratio   ECHO:  Result status: Edited Result - FINAL  [facility]  [address], [place] [address]  [phone]  -------------------------------------------------------------------  Echocardiography  (Report amended )  Patient: Arshan, Jabs  MR #: [id number]  Study Date: 03/19/2017  Gender: M  Age: 66  Height: 180.3 cm  Weight: 89.8 kg  BSA: 2.14 m^2  Pt. Status:  Room:  SONOGRAPHER Cindy Hazy, RDCS  ATTENDING Ena Dawley, M.D.  PERFORMING [facility] Outpatient  Joanna Hews [phone]  [place] Guardian [phone]  cc:  -------------------------------------------------------------------  LV EF: 50% - 55%  -------------------------------------------------------------------  Indications: I35.9 Aortic Valve Disorder.  -------------------------------------------------------------------  History: PMH: Acquired from the patient and from the patient&'s  chart. PMH: Obstructive Sleep Apnea-CPAP. Dilated Aortic Root.  Risk factors: Former tobacco use. Dyslipidemia.  -------------------------------------------------------------------  Study Conclusions  - Left ventricle: The cavity size was severely dilated. Wall  thickness was normal. Systolic function was normal. The estimated  ejection fraction was in the range of 50% to 55%. Wall motion was  normal; there were no regional wall motion abnormalities. Doppler  parameters are consistent with abnormal left ventricular  relaxation (grade 1 diastolic dysfunction). There was no evidence  of elevated ventricular filling pressure by Doppler parameters.  - Aortic valve: There was severe regurgitation.  - Aortic root: The aortic root was moderately dilated measuring 46  mm.  - Ascending aorta: The ascending aorta was upper normal size  measuring 40 mm.  - Mitral valve: There was mild regurgitation.  - Left atrium: The atrium was mildly dilated.  - Right ventricle: The cavity size was normal. Wall thickness was  normal. Systolic function was normal.  - Right atrium: The atrium was normal in size.    - Inferior vena cava: The vessel was normal in size.  - Pericardium, extracardiac: There was no pericardial effusion.  Impressions:  - When compared to the prior study from 04/13/2016 there are  significant changes.  LV size has increased from normal to moderately to severely  dilated.  LVEF has decreased from 60-65% to 50-55%.  Aortic root remains moderately dilated with now severe aortic  regurgitation.  There is partially flail or redundant chordae of the anterior  mitral valve leaflet with no significant mitral regurgitation.  CT surgery consult should be considered.  -------------------------------------------------------------------  Study data: Study status: Routine. Procedure: The patient  reported no pain pre or post test. Transthoracic echocardiography  for left ventricular function evaluation, for right ventricular  function evaluation, and for assessment of valvular function. Image  quality was adequate. Study completion: There were no  complications. Echocardiography. M-mode, complete 2D,  spectral Doppler, and color Doppler. Birthdate: Patient  birthdate: [birth year]. Age: Patient is 62 yr old. Sex: Gender:  male. BMI: 27.6 kg/m^2. Blood pressure: 125/64 Patient  status: Outpatient. Study date: Study date: 03/19/2017. Study  time: 08:42 AM. Location: [facility]  -------------------------------------------------------------------  -------------------------------------------------------------------  Left ventricle: The cavity size was severely dilated. Wall  thickness was normal. Systolic function was normal. The estimated  ejection fraction was in the range of 50% to 55%. Wall motion was  normal; there were no regional wall motion abnormalities. Doppler  parameters are consistent with abnormal left ventricular relaxation  (grade 1 diastolic dysfunction). There was no evidence of elevated  ventricular filling pressure by Doppler parameters.   -------------------------------------------------------------------  Aortic valve: Trileaflet; mildly thickened, mildly calcified  leaflets. Mobility was not restricted. Doppler: Transvalvular  velocity was within  the normal range. There was no stenosis. There  was severe regurgitation. VTI ratio of LVOT to aortic valve:  0.73. Valve area (VTI): 4.47 cm^2. Indexed valve area (VTI): 2.09  cm^2/m^2. Peak velocity ratio of LVOT to aortic valve: 0.68. Valve  area (Vmax): 4.19 cm^2. Indexed valve area (Vmax): 1.96 cm^2/m^2.  Mean velocity ratio of LVOT to aortic valve: 0.66. Valve area  (Vmean): 4.05 cm^2. Indexed valve area (Vmean): 1.89 cm^2/m^2.  Mean gradient (S): 7 mm Hg. Peak gradient (S): 12 mm Hg.  -------------------------------------------------------------------  Aorta: Aortic root: The aortic root was moderately dilated  measuring 46 mm.  Ascending aorta: The ascending aorta was upper normal size  measuring 40 mm.  -------------------------------------------------------------------  Mitral valve: Structurally normal valve. Mobility was not  restricted. Doppler: Transvalvular velocity was within the normal  range. There was no evidence for stenosis. There was mild  regurgitation.  -------------------------------------------------------------------  Left atrium: The atrium was mildly dilated.  -------------------------------------------------------------------  Right ventricle: The cavity size was normal. Wall thickness was  normal. Systolic function was normal.  -------------------------------------------------------------------  Pulmonic valve: Structurally normal valve. Cusp separation was  normal. Doppler: Transvalvular velocity was within the normal  range. There was no evidence for stenosis. There was trivial  regurgitation.  -------------------------------------------------------------------  Tricuspid valve: Structurally normal valve. Doppler:  Transvalvular velocity  was within the normal range. There was no  regurgitation.  -------------------------------------------------------------------  Pulmonary artery: The main pulmonary artery was normal-sized.  Systolic pressure could not be accurately estimated.  -------------------------------------------------------------------  Right atrium: The atrium was normal in size.  -------------------------------------------------------------------  Pericardium: There was no pericardial effusion.  -------------------------------------------------------------------  Systemic veins:  Inferior vena cava: The vessel was normal in size.  -------------------------------------------------------------------  Measurements  Left ventricle Value Reference  LV ID, ED, PLAX chordal (H) 64 mm 43 - 52  LV ID, ES, PLAX chordal (H) 46.1 mm 23 - 38  LV fx shortening, PLAX chordal (L) 28 % >=29  LV PW thickness, ED 10 mm ---------  IVS/LV PW ratio, ED 0.9 <=1.3  Stroke volume, 2D 161 ml ---------  Stroke volume/bsa, 2D 75 ml/m^2 ---------  LV e&', lateral 5.59 cm/s ---------  LV E/e&', lateral 10.41 ---------  LV e&', medial 8.77 cm/s ---------  LV E/e&', medial 6.64 ---------  LV e&', average 7.18 cm/s ---------  LV E/e&', average 8.11 ---------  Ventricular septum Value Reference  IVS thickness, ED 9.01 mm ---------  LVOT Value Reference  LVOT ID, S 28 mm ---------  LVOT area 6.16 cm^2 ---------  LVOT ID 28 mm ---------  LVOT peak velocity, S 119 cm/s ---------  LVOT mean velocity, S 85.4 cm/s ---------  LVOT VTI, S 26.1 cm ---------  LVOT peak gradient, S 6 mm Hg ---------  Stroke volume (SV), LVOT DP 160.7 ml ---------  Stroke index (SV/bsa), LVOT DP 75.2 ml/m^2 ---------  Aortic valve Value Reference  Aortic valve peak velocity, S 175 cm/s ---------  Aortic valve mean velocity, S 130 cm/s ---------  Aortic valve VTI, S 36 cm ---------  Aortic mean gradient, S 7 mm Hg ---------  Aortic peak gradient, S 12 mm Hg  ---------  VTI ratio, LVOT/AV 0.73 ---------  Aortic valve area, VTI 4.47 cm^2 ---------  Aortic valve area/bsa, VTI 2.09 cm^2/m^2 ---------  Velocity ratio, peak, LVOT/AV 0.68 ---------  Aortic valve area, peak velocity 4.19 cm^2 ---------  Aortic valve area/bsa, peak 1.96 cm^2/m^2 ---------  velocity  Velocity ratio, mean, LVOT/AV 0.66 ---------  Aortic valve area, mean velocity 4.05 cm^2 ---------  Aortic valve area/bsa, mean 1.89 cm^2/m^2 ---------  velocity  Aortic regurg pressure half-time 356 ms ---------  Aorta Value Reference  Aortic root ID, ED 46 mm ---------  Ascending aorta ID, A-P, S 40 mm ---------  Left atrium Value Reference  LA ID, A-P, ES 40 mm ---------  LA ID/bsa, A-P 1.87 cm/m^2 <=2.2  LA volume, S 60 ml ---------  LA volume/bsa, S 28.1 ml/m^2 ---------  LA volume, ES, 1-p A4C 54 ml ---------  LA volume/bsa, ES, 1-p A4C 25.3 ml/m^2 ---------  LA volume, ES, 1-p A2C 62 ml ---------  LA volume/bsa, ES, 1-p A2C 29 ml/m^2 ---------  Mitral valve Value Reference  Mitral E-wave peak velocity 58.2 cm/s ---------  Mitral A-wave peak velocity 76 cm/s ---------  Mitral deceleration time (H) 271 ms 150 - 230  Mitral E/A ratio, peak 0.8 ---------  Right ventricle Value Reference  RV s&', lateral, S 20.8 cm/s ---------  Legend:  (L) and (H) mark values outside specified reference range.  -------------------------------------------------------------------  Lance Morin, M.D.  2018-11-20T17:55:38  TEE: *[facility]*  [address], [place] [address]  [phone]  -------------------------------------------------------------------  Transesophageal Echocardiography  Patient: Norville, Dani  MR #: [id number]  Study Date: 03/29/2017  Gender: M  Age: 11  Height: 180.3 cm  Weight: 88.2 kg  BSA: 2.12 m^2  Pt. Status:  Room:  ADMITTING Kirk Ruths  ATTENDING Kirk Ruths  ORDERING Kirk Ruths    PERFORMING Kirk Ruths  REFERRING Kirk Ruths  SONOGRAPHER Jannett Celestine, RDCS  cc:  -------------------------------------------------------------------  LV EF: 55% - 60%  -------------------------------------------------------------------  History: PMH: Aortic Valve Disease. Risk factors:  Dyslipidemia.  -------------------------------------------------------------------  Study Conclusions  - Left ventricle: Systolic function was normal. The estimated  ejection fraction was in the range of 55% to 60%. Wall motion was  normal; there were no regional wall motion abnormalities.  - Aortic valve: There was a small vegetation on the left  ventricular aspect of the left coronary cusp. There was severe  regurgitation.  - Mitral valve: No evidence of vegetation. There was mild  regurgitation.  - Right atrium: No evidence of thrombus in the atrial cavity or  appendage.  - Tricuspid valve: No evidence of vegetation.  - Pulmonic valve: No evidence of vegetation.  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  -------------------------------------------------------------------  Study data: Study status: Routine. Consent: The risks,  benefits, and alternatives to the procedure were explained to the  patient and informed consent was obtained. Procedure: The patient  reported no pain pre or post test. Initial setup. The patient was  brought to the laboratory. Surface ECG leads were monitored.  Sedation. Sedation was administered by cardiology staff.  Transesophageal echocardiography. A transesophageal probe was  inserted by the attending cardiologist. Image quality was adequate.  Study completion: The patient tolerated the procedure well.  There  were no complications. Diagnostic transesophageal  echocardiography. 2D and color Doppler. Birthdate: Patient  birthdate: 31-Jan-1956. Age: Patient is 62 yr old. Sex: Gender:  male. BMI: 27.1 kg/m^2. Blood pressure: 117/50 Patient  status: Outpatient. Study date: Study date: 03/29/2017. Study  time: 10:58 AM. Location: Endoscopy.  -------------------------------------------------------------------  -------------------------------------------------------------------  Left ventricle: Systolic function was normal. The estimated  ejection fraction was in the range of 55% to 60%. Wall motion was  normal; there were no regional wall motion abnormalities.  -------------------------------------------------------------------  Aortic valve: Trileaflet; mildly thickened leaflets. There was a  small vegetation on the left ventricular aspect of the left  coronary cusp. Doppler: There was severe regurgitation.  -------------------------------------------------------------------  Aorta: Descending aorta: The descending aorta had mild diffuse  disease.  -------------------------------------------------------------------  Mitral valve: Mildly thickened leaflets . Leaflet separation was  normal. No evidence of vegetation. Doppler: There was mild  regurgitation.  -------------------------------------------------------------------  Left atrium: The atrium was normal in size.  -------------------------------------------------------------------  Right ventricle: The cavity size was normal. Systolic function was  normal.  -------------------------------------------------------------------  Pulmonic valve: Structurally normal valve. Cusp separation was  normal. No evidence of vegetation.  -------------------------------------------------------------------  Tricuspid valve: Structurally normal valve. Leaflet separation  was normal. No evidence of vegetation. Doppler: There was mild  regurgitation.    -------------------------------------------------------------------  Right atrium: The atrium was normal in size. No evidence of  thrombus in the atrial cavity or appendage.  -------------------------------------------------------------------  Pericardium: There was no pericardial effusion.  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  - RESECTION MARGINS, NEGATIVE FOR MALIGNANCY.  - PLEASE SEE ONCOLOGY TEMPLATE FOR DETAILS.  Microscopic Comment  ONCOLOGY TABLE - TESTIS  1. Specimen and laterality: Right testis  2. Tumor focality: Unifocal  3. Macroscopic extent of tumor: Limited to testicular parenchyma.  4. Maximum tumor size (cm): 6.3 cm  5. Histologic type: Seminoma  6. Microscopic tumor extension: Limited to testis  7. Spermatic cord and surgical margins: Negative  8. Lymph-Vascular invasion: Present  9. Intratubular germ cell neoplasia: Present  10. Lymph nodes: # examined: N/A; # positive: N/A  11. TNM code: pT2, pNX  12. Serum tumor markers: See patient's medical record  13. Comment: The cut surfaces of the tumor show a 6.3 cm well circumscribed tan soft mass.  Microscopically, sections show uniform malignant cells with prominent nucleoli, abundant cytoplasm with  mitotic activity and tumor necrosis. Angiolymphatic invasion is present. The tumor is limited to testis.  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 Immunohistochemical stains were performed and  the neoplastic cells show the following immunoprofile:  CD117 - negative  OCT4- positive  EMA- negative  CK AE1/AE3- negative  PLAP - positive  CD30 - virtually negative  CD20 - negative  AFP - negative  1 of 2  FINAL for KIYOSHI, SCHAAB ([id number])  Microscopic Comment(continued)  HCG- negative  The  control stained appropriately. The overall morphologic and immunohistochemical finding are diagnostic  for seminoma. The final resection margin is negative. (HCL:kh 09-15-13)  H. CATHERINE LI MD  Aortic Size Index= 4.7 /Body surface area is 2.16 meters squared. = 2.2  < 2.75 cm/m2 4% risk per year  2.75 to 4.25 8% risk per year  > 4.25 cm/m2 20% risk per year     Assessment / Plan:  1/Significant aortic insufficiency without overt symptoms of congestive heart failure but with evidence of left ventricular dilatation and relatively well preserved LV function.  With culture proven endocarditis, and possible embolic phenomenon to the spleen.  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 The patient has completed 6 weeks of IV antibiotics on January 16.  Follow up  blood cultures are negative .  At the suggestion of infectious disease   bilateral hip MRIs have been done prior to her surgery.   I have again reviewed with the patient and his wife the risks and options of surgery . We also discussed the pros and cons of mechanical versus tissue valves. The patient prefers a tissue valve to avoid long-term Coumadin.    The goals risks and alternatives of the planned surgical procedure have been discussed with the patient in detail. The risks of the procedure including death, infection, heart block and need for permanent pacemaker stroke, myocardial infarction, bleeding, blood transfusion have all been discussed specifically. IZAN MIRON is willing to proceed with the planned procedure.         [name]  MD      [address] [place] [address] Office [phone]   Beeper [phone]  06/05/2017 7:58 AM

## 2017-06-05 NOTE — Anesthesia Procedure Notes (Signed)
Arterial Line Insertion Start/End2/09/2017 7:40 AM, 06/05/2017 8:08 AM Performed by: Teressa Lower., CRNA, CRNA  Patient location: Pre-op. Preanesthetic checklist: patient identified, IV checked, site marked, risks and benefits discussed, surgical consent, monitors and equipment checked, pre-op evaluation, timeout performed and anesthesia consent Lidocaine 1% used for infiltration and patient sedated Left, radial was placed Catheter size: 18 G Hand hygiene performed , maximum sterile barriers used  and Seldinger technique used Allen's test indicative of satisfactory collateral circulation Attempts: 2 Procedure performed without using ultrasound guided technique. Following insertion, Biopatch and dressing applied. Post procedure assessment: normal  Patient tolerated the procedure well with no immediate complications.

## 2017-06-05 NOTE — Transfer of Care (Signed)
Immediate Anesthesia Transfer of Care Note  Patient: [name] Anderson  Procedure(s) Performed: AORTIC VALVE REPLACEMENT (AVR) using 36mm Magna Ease valve (N/A ) TRANSESOPHAGEAL ECHOCARDIOGRAM (TEE) (N/A )  Patient Location: SICU  Anesthesia Type:General  Level of Consciousness: Patient remains intubated per anesthesia plan  Airway & Oxygen Therapy: Patient remains intubated per anesthesia plan and Patient placed on Ventilator (see vital sign flow sheet for setting)  Post-op Assessment: Report given to RN and Post -op Vital signs reviewed and stable  Post vital signs: Reviewed and stable  Last Vitals:  Vitals:   06/05/17 0633  BP: (!) 163/57  Pulse: 90  Resp: 18  Temp: 36.4 C  SpO2: 97%    Last Pain:  Vitals:   06/05/17 0633  TempSrc: Oral      Patients Stated Pain Goal: 3 (27/61/47 0929)  Complications: No apparent anesthesia complications

## 2017-06-05 NOTE — Anesthesia Procedure Notes (Addendum)
Central Venous Catheter Insertion Performed by: Roberts Gaudy, MD, anesthesiologist Start/End2/09/2017 7:50 AM, 06/05/2017 8:00 AM Patient location: Pre-op. Preanesthetic checklist: patient identified, IV checked, site marked, risks and benefits discussed, surgical consent, monitors and equipment checked, pre-op evaluation, timeout performed and anesthesia consent Hand hygiene performed  and maximum sterile barriers used  PA cath was placed.Swan type:thermodilution Procedure performed without using ultrasound guided technique. Attempts: 1 Following insertion, line sutured, dressing applied and Biopatch. Post procedure assessment: blood return through all ports, free fluid flow and no air  Patient tolerated the procedure well with no immediate complications.

## 2017-06-05 NOTE — Procedures (Signed)
Extubation Procedure Note  Patient Details:   Name: [name] Anderson DOB: 08/11/1955 MRN: [id number]   Airway Documentation:  Airway 8 mm (Active)  Secured at (cm) 23 cm 06/05/2017  2:30 PM  Measured From Lips 06/05/2017  2:30 PM  Secured Location Right 06/05/2017  2:20 PM  Secured By Pink Tape 06/05/2017  2:20 PM    Evaluation  O2 sats: stable throughout Complications: No apparent complications Patient did tolerate procedure well. Bilateral Breath Sounds: Clear   Yes  Patient had a NIF of -35 and a VC of 1.5. Extubated patient to 4L nasal cannula. Patient oriented to time and place.   Dimple Nanas 06/05/2017, 6:08 PM

## 2017-06-06 ENCOUNTER — Encounter (HOSPITAL_COMMUNITY): Payer: Self-pay | Admitting: Cardiothoracic Surgery

## 2017-06-06 ENCOUNTER — Inpatient Hospital Stay (HOSPITAL_COMMUNITY): Payer: 59

## 2017-06-06 ENCOUNTER — Other Ambulatory Visit: Payer: Self-pay

## 2017-06-06 LAB — GLUCOSE, CAPILLARY
Glucose-Capillary: 185 mg/dL — ABNORMAL HIGH (ref 65–99)
Glucose-Capillary: 149 mg/dL — ABNORMAL HIGH (ref 65–99)
Glucose-Capillary: 133 mg/dL — ABNORMAL HIGH (ref 65–99)
Glucose-Capillary: 132 mg/dL — ABNORMAL HIGH (ref 65–99)
Glucose-Capillary: 136 mg/dL — ABNORMAL HIGH (ref 65–99)
Glucose-Capillary: 144 mg/dL — ABNORMAL HIGH (ref 65–99)

## 2017-06-06 LAB — POCT I-STAT, CHEM 8
BUN: 20 mg/dL (ref 6–20)
Calcium, Ion: 1.13 mmol/L — ABNORMAL LOW (ref 1.15–1.40)
Chloride: 100 mmol/L — ABNORMAL LOW (ref 101–111)
Creatinine, Ser: 0.9 mg/dL (ref 0.61–1.24)
Glucose, Bld: 123 mg/dL — ABNORMAL HIGH (ref 65–99)
HCT: 37 % — ABNORMAL LOW (ref 39.0–52.0)
Hemoglobin: 12.6 g/dL — ABNORMAL LOW (ref 13.0–17.0)
Potassium: 4.1 mmol/L (ref 3.5–5.1)
Sodium: 137 mmol/L (ref 135–145)
TCO2: 24 mmol/L (ref 22–32)

## 2017-06-06 LAB — BASIC METABOLIC PANEL
Anion gap: 10 (ref 5–15)
BUN: 19 mg/dL (ref 6–20)
CO2: 21 mmol/L — ABNORMAL LOW (ref 22–32)
Calcium: 7.9 mg/dL — ABNORMAL LOW (ref 8.9–10.3)
Chloride: 105 mmol/L (ref 101–111)
Creatinine, Ser: 1.05 mg/dL (ref 0.61–1.24)
GFR calc Af Amer: >60 mL/min (ref >60)
GFR calc non Af Amer: >60 mL/min (ref >60)
Glucose, Bld: 161 mg/dL — ABNORMAL HIGH (ref 65–99)
Potassium: 4.4 mmol/L (ref 3.5–5.1)
Sodium: 136 mmol/L (ref 135–145)

## 2017-06-06 LAB — MAGNESIUM
Magnesium: 2.2 mg/dL (ref 1.7–2.4)
Magnesium: 2.1 mg/dL (ref 1.7–2.4)

## 2017-06-06 LAB — CBC
HCT: 36.5 % — ABNORMAL LOW (ref 39.0–52.0)
HCT: 38.6 % — ABNORMAL LOW (ref 39.0–52.0)
Hemoglobin: 12.2 g/dL — ABNORMAL LOW (ref 13.0–17.0)
Hemoglobin: 12.6 g/dL — ABNORMAL LOW (ref 13.0–17.0)
MCH: 28 pg (ref 26.0–34.0)
MCH: 27.6 pg (ref 26.0–34.0)
MCHC: 33.4 g/dL (ref 30.0–36.0)
MCHC: 32.6 g/dL (ref 30.0–36.0)
MCV: 83.9 fL (ref 78.0–100.0)
MCV: 84.6 fL (ref 78.0–100.0)
Platelets: 138 10*3/uL — ABNORMAL LOW (ref 150–400)
Platelets: 145 10*3/uL — ABNORMAL LOW (ref 150–400)
RBC: 4.35 MIL/uL (ref 4.22–5.81)
RBC: 4.56 MIL/uL (ref 4.22–5.81)
RDW: 15.5 % (ref 11.5–15.5)
RDW: 15.6 % — ABNORMAL HIGH (ref 11.5–15.5)
WBC: 15.2 10*3/uL — ABNORMAL HIGH (ref 4.0–10.5)
WBC: 17.7 10*3/uL — ABNORMAL HIGH (ref 4.0–10.5)

## 2017-06-06 LAB — CREATININE, SERUM
Creatinine, Ser: 0.99 mg/dL (ref 0.61–1.24)
GFR calc Af Amer: >60 mL/min (ref >60)
GFR calc non Af Amer: >60 mL/min (ref >60)

## 2017-06-06 MED ORDER — INSULIN ASPART 100 UNIT/ML ~~LOC~~ SOLN: 0.0000 [IU] | SUBCUTANEOUS | Status: DC

## 2017-06-06 MED ORDER — AMIODARONE HCL IN DEXTROSE 360-4.14 MG/200ML-% IV SOLN: 30.0000 mg/h | INTRAVENOUS | Status: DC

## 2017-06-06 MED ORDER — ENOXAPARIN SODIUM 30 MG/0.3ML ~~LOC~~ SOLN
30.0000 mg | Freq: Every day | SUBCUTANEOUS | Status: DC
  Administered 2017-06-06 – 2017-06-08 (×3): 30 mg via SUBCUTANEOUS
  Filled 2017-06-06 (×3): qty 0.3

## 2017-06-06 MED ORDER — AMIODARONE HCL IN DEXTROSE 360-4.14 MG/200ML-% IV SOLN
30.0000 mg/h | INTRAVENOUS | Status: DC
  Administered 2017-06-07: 30 mg/h via INTRAVENOUS
  Filled 2017-06-06 (×2): qty 200

## 2017-06-06 MED ORDER — AMIODARONE HCL IN DEXTROSE 360-4.14 MG/200ML-% IV SOLN
60.0000 mg/h | INTRAVENOUS | Status: DC
  Administered 2017-06-06 – 2017-06-07 (×2): 60 mg/h via INTRAVENOUS
  Filled 2017-06-06: qty 200

## 2017-06-06 MED ORDER — AMIODARONE HCL IN DEXTROSE 360-4.14 MG/200ML-% IV SOLN: 60.0000 mg/h | INTRAVENOUS | Status: DC

## 2017-06-06 MED ORDER — INSULIN ASPART 100 UNIT/ML ~~LOC~~ SOLN
0.0000 [IU] | SUBCUTANEOUS | Status: DC
  Administered 2017-06-06 – 2017-06-07 (×5): 2 [IU] via SUBCUTANEOUS

## 2017-06-06 NOTE — Plan of Care (Signed)
  Progressing Activity: Risk for activity intolerance will decrease 06/06/2017 1125 - Progressing by Boris Lown, RN Note Up to chair today. Cardiac: Hemodynamic stability will improve 06/06/2017 1125 - Progressing by Boris Lown, RN Clinical Measurements: Postoperative complications will be avoided or minimized 06/06/2017 1125 - Progressing by Boris Lown, RN Respiratory: Respiratory status will improve 06/06/2017 1125 - Progressing by Boris Lown, RN Note Doing well with incentive spirometry.  Skin Integrity: Risk for impaired skin integrity will decrease 06/06/2017 1125 - Progressing by Boris Lown, RN Nutrition: Adequate nutrition will be maintained 06/06/2017 1125 - Progressing by Boris Lown, RN Coping: Level of anxiety will decrease 06/06/2017 1125 - Progressing by Boris Lown, RN Pain Managment: General experience of comfort will improve 06/06/2017 1125 - Progressing by Boris Lown, RN

## 2017-06-06 NOTE — Progress Notes (Signed)
Dr. Roxan Hockey paged for pt in Afib, rate low 100's to 1 teens after ambulation. Other vital signs stable. No complaints of pain. Orders to start amio per protocol. Will continue to monitor.

## 2017-06-06 NOTE — Progress Notes (Signed)
Patient ID: [name] Anderson, male   DOB: [birth year], 62 y.o.   MRN: [id number] [facility] DAILY ICU PROGRESS NOTE                   [address]            [address],[place] [address]          [phone]   1 Day Post-Op Procedure(s) (LRB): AORTIC VALVE REPLACEMENT (AVR) using 33mm Magna Ease valve (N/A) TRANSESOPHAGEAL ECHOCARDIOGRAM (TEE) (N/A)  Total Length of Stay:  LOS: 1 day   Subjective: Patient extubated last night, this morning awake alert neurologically intact  Objective: Vital signs in last 24 hours: Temp:  [97.5 F (36.4 C)-99 F (37.2 C)] 99 F (37.2 C) (02/07 0800) Pulse Rate:  [77-92] 90 (02/07 0800) Cardiac Rhythm: Normal sinus rhythm (02/07 0800) Resp:  [12-38] 35 (02/07 0800) BP: (87-122)/(69-97) 122/97 (02/07 0800) SpO2:  [94 %-100 %] 97 % (02/07 0800) Arterial Line BP: (85-125)/(59-77) 125/76 (02/07 0800) FiO2 (%):  [40 %-60 %] 40 % (02/06 1735) Weight:  [209 lb 14.4 oz (95.2 kg)-216 lb 14.9 oz (98.4 kg)] 209 lb 14.4 oz (95.2 kg) (02/07 0500)  Filed Weights   06/05/17 0633 06/05/17 1559 06/06/17 0500  Weight: 209 lb (94.8 kg) 216 lb 14.9 oz (98.4 kg) 209 lb 14.4 oz (95.2 kg)    Weight change: 7 lb 14.9 oz (3.598 kg)   Hemodynamic parameters for last 24 hours: PAP: (22-38)/(9-22) 24/11 CO:  [3.8 L/min-5.7 L/min] 5 L/min CI:  [1.8 L/min/m2-2.6 L/min/m2] 2.3 L/min/m2  Intake/Output from previous day: 02/06 0701 - 02/07 0700 In: 4224.1 [P.O.:180; I.V.:2519.1; Blood:875; IV Piggyback:650] Out: 2458 [Urine:2365; Blood:800; Chest Tube:155]  Intake/Output this shift: Total I/O In: 60 [I.V.:60] Out: 25 [Urine:25]  Current Meds: Scheduled Meds: . acetaminophen  1,000 mg Oral Q6H   Or  . acetaminophen (TYLENOL) oral liquid 160 mg/5 mL  1,000 mg Per Tube Q6H  . aspirin EC  325 mg Oral Daily   Or  . aspirin  324 mg Per Tube Daily  . atorvastatin  10 mg Oral q1800  . bisacodyl  10 mg Oral Daily   Or  . bisacodyl  10 mg Rectal Daily  .  Chlorhexidine Gluconate Cloth  6 each Topical Daily  . docusate sodium  200 mg Oral Daily  . insulin aspart  0-24 Units Subcutaneous Q4H  . mouth rinse  15 mL Mouth Rinse BID  . metoprolol tartrate  12.5 mg Oral BID   Or  . metoprolol tartrate  12.5 mg Per Tube BID  . pantoprazole  40 mg Oral Daily  . sodium chloride flush  10-40 mL Intracatheter Q12H  . sodium chloride flush  3 mL Intravenous Q12H   Continuous Infusions: . sodium chloride 20 mL/hr at 06/06/17 0800  . sodium chloride    . sodium chloride 10 mL/hr at 06/06/17 0800  . albumin human    . cefUROXime (ZINACEF)  IV 1.5 g (06/06/17 0543)  . dexmedetomidine (PRECEDEX) IV infusion Stopped (06/05/17 1710)  . famotidine (PEPCID) IV Stopped (06/05/17 1445)  . lactated ringers 20 mL/hr at 06/05/17 2000  . lactated ringers 20 mL/hr at 06/06/17 0800  . nitroGLYCERIN    . phenylephrine (NEO-SYNEPHRINE) Adult infusion Stopped (06/05/17 2141)   PRN Meds:.sodium chloride, albumin human, metoprolol tartrate, midazolam, morphine injection, ondansetron (ZOFRAN) IV, oxyCODONE, pneumococcal 23 valent vaccine, sodium chloride flush, sodium chloride flush, traMADol  General appearance: alert, cooperative and no distress Neurologic:  intact Heart: regular rate and rhythm, S1, S2 normal, no murmur, click, rub or gallop Lungs: diminished breath sounds bibasilar Abdomen: soft, non-tender; bowel sounds normal; no masses,  no organomegaly Extremities: extremities normal, atraumatic, no cyanosis or edema and Homans sign is negative, no sign of DVT Wound: Sternum stable dressing intact  Lab Results: CBC: Recent Labs    06/05/17 1940 06/05/17 1958 06/06/17 0345  WBC 14.6*  --  15.2*  HGB 12.8* 12.2* 12.2*  HCT 38.4* 36.0* 36.5*  PLT 139*  --  138*   BMET:  Recent Labs    06/05/17 1958 06/06/17 0345  NA 140 136  K 4.7 4.4  CL 106 105  CO2  --  21*  GLUCOSE 164* 161*  BUN 19 19  CREATININE 0.90 1.05  CALCIUM  --  7.9*     CMET: Lab Results  Component Value Date   WBC 15.2 (H) 06/06/2017   HGB 12.2 (L) 06/06/2017   HCT 36.5 (L) 06/06/2017   PLT 138 (L) 06/06/2017   GLUCOSE 161 (H) 06/06/2017   CHOL 165 03/30/2017   TRIG 91 03/30/2017   HDL 24 (L) 03/30/2017   LDLDIRECT 160.9 02/12/2013   LDLCALC 123 (H) 03/30/2017   ALT 13 (L) 05/30/2017   AST 19 05/30/2017   NA 136 06/06/2017   K 4.4 06/06/2017   CL 105 06/06/2017   CREATININE 1.05 06/06/2017   BUN 19 06/06/2017   CO2 21 (L) 06/06/2017   TSH 3.911 03/29/2017   PSA 0.37 02/12/2013   INR 1.38 06/05/2017   HGBA1C 5.2 05/30/2017      PT/INR:  Recent Labs    06/05/17 1419  LABPROT 16.9*  INR 1.38   Radiology: Dg Chest Port 1 View  Result Date: 06/05/2017 CLINICAL DATA:  Followup aortic valve replacement. EXAM: PORTABLE CHEST 1 VIEW COMPARISON:  05/30/2017 FINDINGS: Median sternotomy and aortic valve replacement. Endotracheal tube tip 5 cm above the carina. Nasogastric tube enters the abdomen. Right internal jugular central line tip in the SVC 2 cm above the right atrium. Swan-Ganz catheter tip in the main pulmonary artery. No pneumothorax. Mild basilar atelectasis left more than right. Mediastinal drains in place. IMPRESSION: Lines and tubes well positioned. No pneumothorax. Aortic valve replacement. Mild basilar atelectasis left more than right. Electronically Signed   By: Nelson Chimes M.D.   On: 06/05/2017 14:48   Dg Abd Portable 1v  Result Date: 06/05/2017 CLINICAL DATA:  Status post OG tube repositioning. EXAM: PORTABLE ABDOMEN - 1 VIEW COMPARISON:  None. FINDINGS: OG tube is in good position with the side port and tip in the stomach. Multiple tubes and wires overlie the patient. Bowel gas pattern is unremarkable. IMPRESSION: OG tube in good position. Electronically Signed   By: Inge Rise M.D.   On: 06/05/2017 14:56     Assessment/Plan: S/P Procedure(s) (LRB): AORTIC VALVE REPLACEMENT (AVR) using 60mm Magna Ease valve  (N/A) TRANSESOPHAGEAL ECHOCARDIOGRAM (TEE) (N/A) Mobilize Diuresis d/c tubes/lines See progression orders Expected Acute  Blood - loss Anemia- continue to monitor -no blood administered so far  Renal function stable   [name] Anderson 06/06/2017 8:10 AM

## 2017-06-07 ENCOUNTER — Inpatient Hospital Stay (HOSPITAL_COMMUNITY): Payer: 59

## 2017-06-07 LAB — BASIC METABOLIC PANEL
Anion gap: 9 (ref 5–15)
BUN: 17 mg/dL (ref 6–20)
CO2: 22 mmol/L (ref 22–32)
Calcium: 8 mg/dL — ABNORMAL LOW (ref 8.9–10.3)
Chloride: 100 mmol/L — ABNORMAL LOW (ref 101–111)
Creatinine, Ser: 0.89 mg/dL (ref 0.61–1.24)
GFR calc Af Amer: >60 mL/min (ref >60)
GFR calc non Af Amer: >60 mL/min (ref >60)
Glucose, Bld: 136 mg/dL — ABNORMAL HIGH (ref 65–99)
Potassium: 3.8 mmol/L (ref 3.5–5.1)
Sodium: 131 mmol/L — ABNORMAL LOW (ref 135–145)

## 2017-06-07 LAB — CBC
HCT: 34 % — ABNORMAL LOW (ref 39.0–52.0)
Hemoglobin: 11.3 g/dL — ABNORMAL LOW (ref 13.0–17.0)
MCH: 27.8 pg (ref 26.0–34.0)
MCHC: 33.2 g/dL (ref 30.0–36.0)
MCV: 83.7 fL (ref 78.0–100.0)
Platelets: 121 10*3/uL — ABNORMAL LOW (ref 150–400)
RBC: 4.06 MIL/uL — ABNORMAL LOW (ref 4.22–5.81)
RDW: 15.5 % (ref 11.5–15.5)
WBC: 13.8 10*3/uL — ABNORMAL HIGH (ref 4.0–10.5)

## 2017-06-07 LAB — GLUCOSE, CAPILLARY
Glucose-Capillary: 121 mg/dL — ABNORMAL HIGH (ref 65–99)
Glucose-Capillary: 112 mg/dL — ABNORMAL HIGH (ref 65–99)
Glucose-Capillary: 118 mg/dL — ABNORMAL HIGH (ref 65–99)
Glucose-Capillary: 130 mg/dL — ABNORMAL HIGH (ref 65–99)
Glucose-Capillary: 111 mg/dL — ABNORMAL HIGH (ref 65–99)

## 2017-06-07 MED ORDER — BISACODYL 10 MG RE SUPP: 10.0000 mg | Freq: Every day | RECTAL | Status: DC | PRN

## 2017-06-07 MED ORDER — BISACODYL 5 MG PO TBEC: 10.0000 mg | DELAYED_RELEASE_TABLET | Freq: Every day | ORAL | Status: DC | PRN

## 2017-06-07 MED ORDER — SODIUM CHLORIDE 0.9% FLUSH: 3.0000 mL | INTRAVENOUS | Status: DC | PRN

## 2017-06-07 MED ORDER — METOPROLOL TARTRATE 12.5 MG HALF TABLET
12.5000 mg | ORAL_TABLET | Freq: Two times a day (BID) | ORAL | Status: DC
  Administered 2017-06-07 – 2017-06-09 (×4): 12.5 mg via ORAL
  Filled 2017-06-07 (×4): qty 1

## 2017-06-07 MED ORDER — ONDANSETRON HCL 4 MG/2ML IJ SOLN: 4.0000 mg | Freq: Four times a day (QID) | INTRAMUSCULAR | Status: DC | PRN

## 2017-06-07 MED ORDER — OXYCODONE HCL 5 MG PO TABS: 5.0000 mg | ORAL_TABLET | ORAL | Status: DC | PRN

## 2017-06-07 MED ORDER — AMIODARONE HCL 200 MG PO TABS
200.0000 mg | ORAL_TABLET | Freq: Two times a day (BID) | ORAL | Status: DC
  Administered 2017-06-08 – 2017-06-09 (×3): 200 mg via ORAL
  Filled 2017-06-07 (×3): qty 1

## 2017-06-07 MED ORDER — INSULIN ASPART 100 UNIT/ML ~~LOC~~ SOLN: 0.0000 [IU] | Freq: Three times a day (TID) | SUBCUTANEOUS | Status: DC

## 2017-06-07 MED ORDER — MOVING RIGHT ALONG BOOK
Freq: Once | Status: AC
  Administered 2017-06-07: 22:00:00
  Filled 2017-06-07 (×2): qty 1

## 2017-06-07 MED ORDER — SODIUM CHLORIDE 0.9 % IV SOLN: 250.0000 mL | INTRAVENOUS | Status: DC | PRN

## 2017-06-07 MED ORDER — DOCUSATE SODIUM 100 MG PO CAPS
200.0000 mg | ORAL_CAPSULE | Freq: Every day | ORAL | Status: DC
Start: 2017-06-08 — End: 2017-06-09
  Administered 2017-06-08 – 2017-06-09 (×2): 200 mg via ORAL
  Filled 2017-06-07 (×2): qty 2

## 2017-06-07 MED ORDER — ACETAMINOPHEN 325 MG PO TABS
650.0000 mg | ORAL_TABLET | Freq: Four times a day (QID) | ORAL | Status: DC | PRN
  Administered 2017-06-08: 650 mg via ORAL
  Filled 2017-06-07: qty 2

## 2017-06-07 MED ORDER — TRAMADOL HCL 50 MG PO TABS: 50.0000 mg | ORAL_TABLET | ORAL | Status: DC | PRN

## 2017-06-07 MED ORDER — ASPIRIN EC 325 MG PO TBEC
325.0000 mg | DELAYED_RELEASE_TABLET | Freq: Every day | ORAL | Status: DC
  Administered 2017-06-08 – 2017-06-09 (×2): 325 mg via ORAL
  Filled 2017-06-07 (×2): qty 1

## 2017-06-07 MED ORDER — AMIODARONE HCL 200 MG PO TABS
200.0000 mg | ORAL_TABLET | Freq: Two times a day (BID) | ORAL | Status: DC
  Administered 2017-06-07: 200 mg via ORAL
  Filled 2017-06-07: qty 1

## 2017-06-07 MED ORDER — ONDANSETRON HCL 4 MG PO TABS: 4.0000 mg | ORAL_TABLET | Freq: Four times a day (QID) | ORAL | Status: DC | PRN

## 2017-06-07 MED ORDER — PANTOPRAZOLE SODIUM 40 MG PO TBEC
40.0000 mg | DELAYED_RELEASE_TABLET | Freq: Every day | ORAL | Status: DC
  Administered 2017-06-08 – 2017-06-09 (×2): 40 mg via ORAL
  Filled 2017-06-07 (×2): qty 1

## 2017-06-07 MED ORDER — SODIUM CHLORIDE 0.9% FLUSH
3.0000 mL | Freq: Two times a day (BID) | INTRAVENOUS | Status: DC
  Administered 2017-06-07 – 2017-06-08 (×3): 3 mL via INTRAVENOUS

## 2017-06-07 NOTE — Discharge Summary (Signed)
Physician Discharge Summary  Patient ID: [name] Anderson MRN: [id number] DOB/AGE: 07-22-1955 62 y.o.  Admit date: 06/05/2017 Discharge date: 06/09/2017  Admission Diagnosis: Severe aortic insufficiency  Patient Active Problem List   Diagnosis Date Noted  . S/P AVR 06/05/2017  . Severe aortic insufficiency 04/17/2017  . Chills   . Fatigue   . 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 Barrett's esophagus - short segment 03/13/2017  . 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 Pulmonary nodules 09/28/2013  . Aortic root dilatation ([place]) 03/30/2013  . Physical exam, annual 02/11/2013  . Diverticulosis of colon without hemorrhage 02/11/2013  . Basal cell carcinoma of skin 02/11/2013  . Personal history of colonic polyps   . HYPERCHOLESTEROLEMIA 02/17/2009  . AVD (aortic valve disease) 02/17/2009  . UMBILICAL HERNIA 78/29/5621  . NEPHROLITHIASIS 02/17/2009  . SNORING 02/17/2009    Discharge Diagnoses:  1. S/P AVR 2. ABL anemia 3. Mild thrombocytopenia  Discharged Condition: Stable  HPI:  [name] Anderson 62 y.o. male followed in the office . He now presents  For aortic root replacement and aortic valve replacement.  Post treatment blood cultures were negative ,  sed rate was elevated to 70 C-reactive protein normal at 2.9.  The patient does have chronic pain in both hips right slightly more than the left. MRI of hips done last week - negative for evidence of infection.    The patient has a known previous cardiac history of aortic insufficiency and mildly dilated ascending aorta. The patient has been followed for several years with serial echocardiograms and CTA of the chest. In Nov 2018   weeks ago echocardiogram was done suggesting increased  aortic insufficiency. Repeat CTA of the chest suggested possible splenic infarcts. TEE was performed because of these findings and suggestedpossible endocarditis, with increased aortic insufficiency and possible vegetation.  Although the patient did not presentbecause of symptoms, in retrospect he hadfelt more fatigued over the past several months, but denies any specific infections, denies fever and chills, has had no dental recent dental work, he has been noted to be anemic and recent upper GI endoscopy and colonoscopy were performed without specific causes of the anemia.   Following the TEE the patient was admitted to 3 blood cultures were obtainedand grew STAPHYLOCOCCUS SPECIES (COAGULASE NEGATIVE Other than fatigue the patient has no significant symptoms of congestive heart failure, he was with a MRI of the brain in early November for 23-month history of dizziness. MRI was negative for stroke March 12, 2017.  Patient has completed 6 weeks of IVceFAZolin (ANCEF) IVon January 16..Since discharge from the hospital patient has had a cardiac catheterization that showed no significant coronary artery disease.He notes that overall he feels better denies fever chills,shortness of breath is improved.    Hospital Course:  On June 05, 2017 Mr. [name] Anderson underwent an aortic valve replacement with Dr. Servando Snare.  He tolerated the procedure well and was transferred to the cardiac ICU.  He was extubated in a timely manner.  Postop day 1 we initiated diuretic regimen for fluid overload.  He had some expected acute blood loss anemia which we continue to monitor.  We discontinued his chest tubes and lines.  We began to mobilize patient.  That night he went into atrial fibrillation and an amiodarone drip was initiated.  He did feel some palpitations during this time.  He was transitioned to oral Amiodarone. Postop day 2 he was back in normal sinus rhythm with a rate in the 80s.  His  blood pressure was well controlled.  He did have a chest x-ray that showed mild bibasilar atelectatic changes.  His hemoglobin and hematocrit continued to improve.  He was transferred from the ICU to 4E for further convalescence on 02/09. He  has been ambulating on room air. He is tolerating a diet and has had a bowel movement. He has had ABL anemia. His last H and H was stable at 11.1 and 33.3. He also had mild thrombocytopenia. His last platelet count was 134,000. His pre op HGA1C was 5.2. He is not diabetic so accu checks and SS were stopped. Epicardial pacing wires were removed on 02/10. Chest tube sutures will be removed the day of discharge. He is maintaining sinus rhythm. His sternal wound is clean and dry, no sign of infection. He is felt surgically stable for discharge today.  Consults: None  Significant Diagnostic Studies: CLINICAL DATA:  Status post aortic valve replacement  EXAM: PORTABLE CHEST 1 VIEW  COMPARISON:  06/06/2017  FINDINGS: Cardiac shadow is stable. Aortic valve replacement is again noted. Swan-Ganz catheter has been removed. Right jugular central line is again seen in the distal superior vena cava. The lungs are well aerated bilaterally with mild bibasilar atelectasis. No focal infiltrate or sizable effusion is seen. Mediastinal drain is been removed as well.  IMPRESSION: Mild bibasilar atelectatic changes.   Electronically Signed   By: Inez Catalina M.D.   On: 06/07/2017 08:34  Treatments:  None dictated at this time.   Discharge Exam: Blood pressure 131/88, pulse 79, temperature 98.1 F (36.7 C), temperature source Oral, resp. rate (!) 34, height 5\' 11"  (1.803 m), weight 210 lb (95.3 kg), SpO2 97 %.   Cardiovascular: RRR, no murmur Pulmonary: Clear to auscultation  Abdomen: Soft, non tender, bowel sounds present. Extremities: Mild bilateral lower extremity edema. Wound: Clean and dry.  No erythema or signs of infection.   Disposition: 01-Home or Self Care   Allergies as of 06/09/2017   No Known Allergies     Medication List    STOP taking these medications   aspirin 81 MG chewable tablet Replaced by:  aspirin 325 MG EC tablet   losartan 50 MG tablet Commonly known  as:  COZAAR     TAKE these medications   acetaminophen 500 MG tablet Commonly known as:  TYLENOL Take 1 tablet (500 mg total) by mouth every 6 (six) hours as needed (for pain.). What changed:  how much to take   amiodarone 200 MG tablet Commonly known as:  PACERONE Take 1 tablet (200 mg total) by mouth every 12 (twelve) hours. For 5 days then take Amiodarone 200 mg by mouth daily thereafter.   amoxicillin 500 MG capsule Commonly known as:  AMOXIL TAKE 4 TABLETS BY MOUTH 1 HOUR PRIOR TO DENTAL APPOINTMENT   aspirin 325 MG EC tablet Take 1 tablet (325 mg total) by mouth daily. Replaces:  aspirin 81 MG chewable tablet   atorvastatin 10 MG tablet Commonly known as:  LIPITOR Take 1 tablet (10 mg total) by mouth daily at 6 PM.   betamethasone valerate 0.1 % cream Commonly known as:  VALISONE Apply 1 application topically 2 (two) times daily as needed (for skin irritation.).   cetirizine 10 MG tablet Commonly known as:  ZYRTEC Take 10 mg by mouth daily as needed for allergies.   COSAMIN DS PO Take 1 tablet by mouth 2 (two) times daily.   [employer] TRIPLE MAGNESIUM COMPLEX PO Take 1 tablet by mouth 2 (two) times  daily.   ECHINACEA PO Take 760 mg by mouth daily.   fluorometholone 0.1 % ophthalmic suspension Commonly known as:  FML Place 1 drop into both eyes 3 (three) times daily as needed (for irritated/itchy eyes.).   furosemide 40 MG tablet Commonly known as:  LASIX Take 1 tablet (40 mg total) by mouth daily. For 5 days then stop.   HYPERCARE 20 % external solution Generic drug:  aluminum chloride Apply 1 application topically 3 (three) times daily as needed (for sweating.).   Iron 325 (65 Fe) MG Tabs Take 325 mg by mouth daily.   loratadine 10 MG tablet Commonly known as:  CLARITIN Take 10 mg by mouth daily as needed for allergies.   metoprolol tartrate 25 MG tablet Commonly known as:  LOPRESSOR Take 0.5 tablets (12.5 mg total) by mouth 2 (two) times daily.   NON  FORMULARY Take 2 capsules by mouth 2 (two) times daily. DoTerra (Microplex VMz) 2 Capsule twice daily   NON FORMULARY Take 2 capsules by mouth 2 (two) times daily. XE0 Mega "Doterra"   OPCON-A 0.027-0.315 % Soln Generic drug:  Naphazoline-Pheniramine Place 1-2 drops into both eyes 3 (three) times daily as needed (for allergy eyes.).   potassium chloride SA 20 MEQ tablet Commonly known as:  K-DUR,KLOR-CON Take 1 tablet (20 mEq total) by mouth daily. For 5 days then stop.   sildenafil 20 MG tablet Commonly known as:  REVATIO Take 20 mg by mouth daily as needed (for ED).   SUPER B COMPLEX/C PO Take 1 tablet by mouth daily.   traMADol 50 MG tablet Commonly known as:  ULTRAM Take 50 mg by mouth every 4-6 hours PRN moderate pain.   triamcinolone cream 0.1 % Commonly known as:  KENALOG Apply 1 application topically daily as needed (FOR DRY SKIN/ITCHY SKIN.). Apply to area after bath (DO NOT APPLY TO FACE)   vitamin C 1000 MG tablet Take 1,000 mg by mouth daily.     The patient has been discharged on:   1.Beta Blocker:  Yes [ x  ]                              No   [   ]                              If No, reason:  2.Ace Inhibitor/ARB: Yes [   ]                                     No  [ x   ]                                     If No, reason:Labile BP  3.Statin:   Yes [ x  ]                  No  [   ]                  If No, reason:  4.Ecasa:  Yes  [ x  ]                  No   [   ]  If No, reason:  Follow-up Information    [name], MD Follow up.   Specialty:  Cardiothoracic Surgery Why:  Your appointment is on 07/08/2017 at 1:30pm. Please arrive at 1:00pm for a chest xray located at [facility] which is on the first floor of our building.  Contact information: [address] [place] [address] [phone]        Almyra Deforest, [place] Follow up.   Specialties:  Cardiology, Radiology Why:  Almyra Deforest, PA-C 2/27 @8 :30 am  ([facility])  Contact information: [address] [place] [address] [phone]           Signed: Arnoldo Lenis 06/09/2017, 8:20 AM

## 2017-06-07 NOTE — Progress Notes (Signed)
[facility] hospital infusion coordinator will follow pt while in hospital to support possible home IV ABX at [facility] and [facility] services as ordered/needed.  If patient discharges after hours, please call [phone].   Larry Sierras 06/07/2017, 10:39 AM

## 2017-06-07 NOTE — Progress Notes (Addendum)
Patient ID: [name] Anderson, male   DOB: December 12, 1955, 62 y.o.   MRN: [id number] TCTS DAILY ICU PROGRESS NOTE                   [address]            [place] [address]          [phone]   2 Days Post-Op Procedure(s) (LRB): AORTIC VALVE REPLACEMENT (AVR) using 107mm Magna Ease valve (N/A) TRANSESOPHAGEAL ECHOCARDIOGRAM (TEE) (N/A)  Total Length of Stay:  LOS: 2 days   Subjective: No issues overnight. He has walked twice today and feels good. Went into atrial fibrillation around 10pm last night and was started on Amio. He did feel some palpitations when he went into afib.   Objective: Vital signs in last 24 hours: Temp:  [97.9 F (36.6 C)-99 F (37.2 C)] 98.8 F (37.1 C) (02/07 2330) Pulse Rate:  [71-95] 75 (02/08 0600) Cardiac Rhythm: Normal sinus rhythm (02/08 0000) Resp:  [19-38] 19 (02/08 0630) BP: (99-135)/(73-98) 121/81 (02/08 0600) SpO2:  [90 %-99 %] 96 % (02/08 0600) Arterial Line BP: (125-133)/(76-85) 133/85 (02/07 0900) Weight:  [212 lb (96.2 kg)] 212 lb (96.2 kg) (02/08 0500)  Filed Weights   06/05/17 1559 06/06/17 0500 06/07/17 0500  Weight: 216 lb 14.9 oz (98.4 kg) 209 lb 14.4 oz (95.2 kg) 212 lb (96.2 kg)    Weight change: -14.9 oz (-2.237 kg)   Hemodynamic parameters for last 24 hours: PAP: (24-29)/(11-16) 29/16 CO:  [5 L/min] 5 L/min CI:  [2.3 L/min/m2] 2.3 L/min/m2  Intake/Output from previous day: 02/07 0701 - 02/08 0700 In: 1605.7 [P.O.:840; I.V.:715.7; IV Piggyback:50] Out: 830 [Urine:790; Chest Tube:40]  Intake/Output this shift: No intake/output data recorded.  Current Meds: Scheduled Meds: . acetaminophen  1,000 mg Oral Q6H   Or  . acetaminophen (TYLENOL) oral liquid 160 mg/5 mL  1,000 mg Per Tube Q6H  . aspirin EC  325 mg Oral Daily   Or  . aspirin  324 mg Per Tube Daily  . atorvastatin  10 mg Oral q1800  . bisacodyl  10 mg Oral Daily   Or  . bisacodyl  10 mg Rectal Daily  . Chlorhexidine Gluconate Cloth  6 each  Topical Daily  . docusate sodium  200 mg Oral Daily  . enoxaparin (LOVENOX) injection  30 mg Subcutaneous QHS  . insulin aspart  0-24 Units Subcutaneous Q4H  . mouth rinse  15 mL Mouth Rinse BID  . metoprolol tartrate  12.5 mg Oral BID   Or  . metoprolol tartrate  12.5 mg Per Tube BID  . pantoprazole  40 mg Oral Daily  . sodium chloride flush  10-40 mL Intracatheter Q12H  . sodium chloride flush  3 mL Intravenous Q12H   Continuous Infusions: . sodium chloride 20 mL/hr at 06/06/17 0800  . sodium chloride Stopped (06/06/17 1043)  . sodium chloride Stopped (06/06/17 1043)  . albumin human    . amiodarone 30 mg/hr (06/07/17 0447)  . dexmedetomidine (PRECEDEX) IV infusion Stopped (06/05/17 1710)  . lactated ringers Stopped (06/06/17 1043)  . lactated ringers Stopped (06/06/17 1043)  . nitroGLYCERIN    . phenylephrine (NEO-SYNEPHRINE) Adult infusion Stopped (06/05/17 2141)   PRN Meds:.sodium chloride, albumin human, metoprolol tartrate, midazolam, morphine injection, ondansetron (ZOFRAN) IV, oxyCODONE, pneumococcal 23 valent vaccine, sodium chloride flush, sodium chloride flush, traMADol  General appearance: alert, cooperative and no distress Heart: regular rate and rhythm, S1, S2 normal, no murmur, click,  rub or gallop Lungs: clear to auscultation bilaterally Abdomen: soft, non-tender; bowel sounds normal; no masses,  no organomegaly Extremities: 1+ nonpitting pedal edema Wound: clean and dry, covered with sterile dressing.   Lab Results: CBC: Recent Labs    06/06/17 1724 06/06/17 1739 06/07/17 0332  WBC 17.7*  --  13.8*  HGB 12.6* 12.6* 11.3*  HCT 38.6* 37.0* 34.0*  PLT 145*  --  121*   BMET:  Recent Labs    06/06/17 0345  06/06/17 1739 06/07/17 0332  NA 136  --  137 131*  K 4.4  --  4.1 3.8  CL 105  --  100* 100*  CO2 21*  --   --  22  GLUCOSE 161*  --  123* 136*  BUN 19  --  20 17  CREATININE 1.05   < > 0.90 0.89  CALCIUM 7.9*  --   --  8.0*   < > = values in  this interval not displayed.    CMET: Lab Results  Component Value Date   WBC 13.8 (H) 06/07/2017   HGB 11.3 (L) 06/07/2017   HCT 34.0 (L) 06/07/2017   PLT 121 (L) 06/07/2017   GLUCOSE 136 (H) 06/07/2017   CHOL 165 03/30/2017   TRIG 91 03/30/2017   HDL 24 (L) 03/30/2017   LDLDIRECT 160.9 02/12/2013   LDLCALC 123 (H) 03/30/2017   ALT 13 (L) 05/30/2017   AST 19 05/30/2017   NA 131 (L) 06/07/2017   K 3.8 06/07/2017   CL 100 (L) 06/07/2017   CREATININE 0.89 06/07/2017   BUN 17 06/07/2017   CO2 22 06/07/2017   TSH 3.911 03/29/2017   PSA 0.37 02/12/2013   INR 1.38 06/05/2017   HGBA1C 5.2 05/30/2017      PT/INR:  Recent Labs    06/05/17 1419  LABPROT 16.9*  INR 1.38   Radiology: No results found.   Assessment/Plan: S/P Procedure(s) (LRB): AORTIC VALVE REPLACEMENT (AVR) using 60mm Magna Ease valve (N/A) TRANSESOPHAGEAL ECHOCARDIOGRAM (TEE) (N/A)  1. CV-NSR in the 80s, atrial fibrillation last night on Amio gtt.  BP well controlled. ASA, statin, and Lopressor.  2. Pulm-tolerating room air with good oxygen saturation. CXR today shows Mild bibasilar atelectatic changes.  3. Renal-creatinine 0.89, electrolytes okay. Weight up 3 lbs.  4. H and H 11.3/34.0, platelets 121k. Expected acute blood loss anemia.  5. Endo-blood glucose level well controlled.   Plan: Transfer to stepdown when bed available.   Nicholes Rough, PA-C  Recent Results (from the past 240 hour(s))  Surgical pcr screen     Status: Abnormal   Collection Time: 05/30/17 12:16 PM  Result Value Ref Range Status   MRSA, PCR NEGATIVE NEGATIVE Final   Staphylococcus aureus POSITIVE (A) NEGATIVE Final    Comment: (NOTE) The Xpert SA Assay (FDA approved for NASAL specimens in patients 82 years of age and older), is one component of a comprehensive surveillance program. It is not intended to diagnose infection nor to guide or monitor treatment.   Gram stain     Status: None   Collection Time: 06/05/17 11:21  AM  Result Value Ref Range Status   Specimen Description TISSUE  Final   Special Requests AORTIC VALVE  Final   Gram Stain   Final    FEW WBC PRESENT, PREDOMINANTLY MONONUCLEAR NO ORGANISMS SEEN Performed at [facility], [address], [place], [place] [address]    Report Status 06/05/2017 FINAL  Final  Aerobic/Anaerobic Culture (surgical/deep wound)  Status: None (Preliminary result)   Collection Time: 06/05/17 11:21 AM  Result Value Ref Range Status   Specimen Description TISSUE  Final   Special Requests AORTIC VALVE  Final   Gram Stain   Final    RARE WBC PRESENT, PREDOMINANTLY MONONUCLEAR NO ORGANISMS SEEN    Culture   Final    NO GROWTH 2 DAYS NO ANAEROBES ISOLATED; CULTURE IN PROGRESS FOR 5 DAYS Performed at [facility], [address], [place], [place] [address]    Report Status PENDING  Incomplete  Gram stain     Status: None   Collection Time: 06/05/17 11:34 AM  Result Value Ref Range Status   Specimen Description TISSUE  Final   Special Requests MITRAL VALVE  Final   Gram Stain   Final    RARE WBC PRESENT, PREDOMINANTLY MONONUCLEAR NO ORGANISMS SEEN Performed at [facility], [address] [place], [place] [address]    Report Status 06/05/2017 FINAL  Final  Aerobic/Anaerobic Culture (surgical/deep wound)     Status: None (Preliminary result)   Collection Time: 06/05/17 11:34 AM  Result Value Ref Range Status   Specimen Description TISSUE  Final   Special Requests MITRAL VALVE  Final   Gram Stain   Final    FEW WBC PRESENT, PREDOMINANTLY MONONUCLEAR NO ORGANISMS SEEN    Culture   Final    NO GROWTH 2 DAYS NO ANAEROBES ISOLATED; CULTURE IN PROGRESS FOR 5 DAYS Performed at [facility], [place] [address] [place], [place] [address]    Report Status PENDING  Incomplete    Convert to po amiodarone I have seen and examined [name] Anderson and agree with the above assessment  and plan.  [name] MD Beeper [phone] Office  [phone] 06/07/2017 3:05 PM

## 2017-06-07 NOTE — Progress Notes (Signed)
Patient ID: [name] Anderson, male   DOB: 20-Dec-1955, 61 y.o.   MRN: [id number] EVENING ROUNDS NOTE :     [address]       [place] [address]             [phone]                 2 Days Post-Op Procedure(s) (LRB): AORTIC VALVE REPLACEMENT (AVR) using 18mm Magna Ease valve (N/A) TRANSESOPHAGEAL ECHOCARDIOGRAM (TEE) (N/A)  Total Length of Stay:  LOS: 2 days  BP 111/86   Pulse 83   Temp 98.8 F (37.1 C) (Oral)   Resp (!) 29   Ht 5\' 11"  (1.803 m)   Wt 212 lb (96.2 kg)   SpO2 96%   BMI 29.57 kg/m   .Intake/Output      02/08 0701 - 02/09 0700   P.O.    I.V. (mL/kg) 294.4 (3.1)   IV Piggyback    Total Intake(mL/kg) 294.4 (3.1)   Urine (mL/kg/hr)    Chest Tube    Total Output    Net +294.4       Urine Occurrence 1 x   Stool Occurrence 1 x     . sodium chloride       Lab Results  Component Value Date   WBC 13.8 (H) 06/07/2017   HGB 11.3 (L) 06/07/2017   HCT 34.0 (L) 06/07/2017   PLT 121 (L) 06/07/2017   GLUCOSE 136 (H) 06/07/2017   CHOL 165 03/30/2017   TRIG 91 03/30/2017   HDL 24 (L) 03/30/2017   LDLDIRECT 160.9 02/12/2013   LDLCALC 123 (H) 03/30/2017   ALT 13 (L) 05/30/2017   AST 19 05/30/2017   NA 131 (L) 06/07/2017   K 3.8 06/07/2017   CL 100 (L) 06/07/2017   CREATININE 0.89 06/07/2017   BUN 17 06/07/2017   CO2 22 06/07/2017   TSH 3.911 03/29/2017   PSA 0.37 02/12/2013   INR 1.38 06/05/2017   HGBA1C 5.2 05/30/2017   Waiting for bed on Charlyne Petrin MD  Beeper [phone] Office [phone] 06/07/2017 9:10 PM

## 2017-06-07 NOTE — Care Management Note (Signed)
Case Management Note  Patient Details  Name: ZURIEL ROSKOS MRN: [id number] Date of Birth: [birth year]  Subjective/Objective:      Pt is s/p AVR             Action/Plan:  Pt is independent from home with wife.  Pt has PCP and denied barrier to obtaining or prescribing meds. Pt just finished course of IV antibiotics PTA - [facility] has closed case.  Pt may go home with IV antibiotics - [facility] aware to follow   Expected Discharge Date:                  Expected Discharge Plan:  Home/Self Care  In-House Referral:     Discharge planning Services  CM Consult  Post Acute Care Choice:    Choice offered to:     DME Arranged:    DME Agency:     HH Arranged:    HH Agency:     Status of Service:     If discussed at [employer], dates discussed:    Additional Comments:  Maryclare Labrador, RN 06/07/2017, 10:28 AM

## 2017-06-08 LAB — CBC
HCT: 33.3 % — ABNORMAL LOW (ref 39.0–52.0)
Hemoglobin: 11.1 g/dL — ABNORMAL LOW (ref 13.0–17.0)
MCH: 28.2 pg (ref 26.0–34.0)
MCHC: 33.3 g/dL (ref 30.0–36.0)
MCV: 84.5 fL (ref 78.0–100.0)
Platelets: 134 10*3/uL — ABNORMAL LOW (ref 150–400)
RBC: 3.94 MIL/uL — ABNORMAL LOW (ref 4.22–5.81)
RDW: 15.4 % (ref 11.5–15.5)
WBC: 11.1 10*3/uL — ABNORMAL HIGH (ref 4.0–10.5)

## 2017-06-08 LAB — BASIC METABOLIC PANEL
Anion gap: 10 (ref 5–15)
BUN: 16 mg/dL (ref 6–20)
CO2: 23 mmol/L (ref 22–32)
Calcium: 8.1 mg/dL — ABNORMAL LOW (ref 8.9–10.3)
Chloride: 102 mmol/L (ref 101–111)
Creatinine, Ser: 0.92 mg/dL (ref 0.61–1.24)
GFR calc Af Amer: >60 mL/min (ref >60)
GFR calc non Af Amer: >60 mL/min (ref >60)
Glucose, Bld: 102 mg/dL — ABNORMAL HIGH (ref 65–99)
Potassium: 3.8 mmol/L (ref 3.5–5.1)
Sodium: 135 mmol/L (ref 135–145)

## 2017-06-08 LAB — GLUCOSE, CAPILLARY
Glucose-Capillary: 111 mg/dL — ABNORMAL HIGH (ref 65–99)
Glucose-Capillary: 107 mg/dL — ABNORMAL HIGH (ref 65–99)

## 2017-06-08 NOTE — Progress Notes (Signed)
Report given to [facility] on 4E. Pt made aware of transfer to 4E19. Wife at bedside. Pt belongings with wife.

## 2017-06-08 NOTE — Plan of Care (Signed)
  Progressing Education: Ability to demonstrate proper wound care will improve 06/08/2017 0828 - Progressing by Jenne Campus, RN Cardiac: Hemodynamic stability will improve 06/08/2017 0828 - Progressing by Jenne Campus, RN Clinical Measurements: Postoperative complications will be avoided or minimized 06/08/2017 0828 - Progressing by Jenne Campus, RN Respiratory: Respiratory status will improve 06/08/2017 0828 - Progressing by Jenne Campus, RN Skin Integrity: Wound healing without signs and symptoms of infection 06/08/2017 0828 - Progressing by Jenne Campus, RN Urinary Elimination: Ability to achieve and maintain adequate renal perfusion and functioning will improve 06/08/2017 0828 - Progressing by Jenne Campus, RN Education: Ability to demonstrate proper wound care will improve 06/08/2017 0828 - Progressing by Jenne Campus, RN  Activity: Risk for activity intolerance will decrease 06/08/2017 0828 - Progressing by Jenne Campus, RN

## 2017-06-08 NOTE — Progress Notes (Signed)
Patient ID: [name] Anderson, male   DOB: 05-Jun-1955, 62 y.o.   MRN: [id number] TCTS DAILY ICU PROGRESS NOTE                   [address]            [place] [address]          [phone]   3 Days Post-Op Procedure(s) (LRB): AORTIC VALVE REPLACEMENT (AVR) using 72mm Magna Ease valve (N/A) TRANSESOPHAGEAL ECHOCARDIOGRAM (TEE) (N/A)  Total Length of Stay:  LOS: 3 days   Subjective: Patient feels well waiting for transfer to the floor  Objective: Vital signs in last 24 hours: Temp:  [97.8 F (36.6 C)-98.8 F (37.1 C)] 98 F (36.7 C) (02/09 0500) Pulse Rate:  [70-85] 81 (02/09 0700) Cardiac Rhythm: Normal sinus rhythm (02/09 0500) Resp:  [13-35] 30 (02/09 0700) BP: (98-121)/(73-92) 117/84 (02/09 0700) SpO2:  [92 %-99 %] 98 % (02/09 0700) Weight:  [210 lb 15.7 oz (95.7 kg)] 210 lb 15.7 oz (95.7 kg) (02/09 0600)  Filed Weights   06/06/17 0500 06/07/17 0500 06/08/17 0600  Weight: 209 lb 14.4 oz (95.2 kg) 212 lb (96.2 kg) 210 lb 15.7 oz (95.7 kg)    Weight change: -0.3 oz (-0.463 kg)   Hemodynamic parameters for last 24 hours:    Intake/Output from previous day: 02/08 0701 - 02/09 0700 In: 294.4 [I.V.:294.4] Out: -   Intake/Output this shift: No intake/output data recorded.  Current Meds: Scheduled Meds: . amiodarone  200 mg Oral Q12H  . aspirin EC  325 mg Oral Daily  . atorvastatin  10 mg Oral q1800  . Chlorhexidine Gluconate Cloth  6 each Topical Daily  . docusate sodium  200 mg Oral Daily  . enoxaparin (LOVENOX) injection  30 mg Subcutaneous QHS  . insulin aspart  0-24 Units Subcutaneous TID AC & HS  . mouth rinse  15 mL Mouth Rinse BID  . metoprolol tartrate  12.5 mg Oral BID  . pantoprazole  40 mg Oral QAC breakfast  . sodium chloride flush  10-40 mL Intracatheter Q12H  . sodium chloride flush  3 mL Intravenous Q12H   Continuous Infusions: . sodium chloride     PRN Meds:.sodium chloride, acetaminophen, bisacodyl **OR** bisacodyl,  ondansetron **OR** ondansetron (ZOFRAN) IV, oxyCODONE, sodium chloride flush, sodium chloride flush, traMADol  General appearance: alert and cooperative Neurologic: intact Heart: regular rate and rhythm, S1, S2 normal, no murmur, click, rub or gallop Lungs: clear to auscultation bilaterally Abdomen: soft, non-tender; bowel sounds normal; no masses,  no organomegaly Extremities: extremities normal, atraumatic, no cyanosis or edema and Homans sign is negative, no sign of DVT Wound: Sternum stable well-healed  Lab Results: CBC: Recent Labs    06/07/17 0332 06/08/17 0242  WBC 13.8* 11.1*  HGB 11.3* 11.1*  HCT 34.0* 33.3*  PLT 121* 134*   BMET:  Recent Labs    06/07/17 0332 06/08/17 0242  NA 131* 135  K 3.8 3.8  CL 100* 102  CO2 22 23  GLUCOSE 136* 102*  BUN 17 16  CREATININE 0.89 0.92  CALCIUM 8.0* 8.1*    CMET: Lab Results  Component Value Date   WBC 11.1 (H) 06/08/2017   HGB 11.1 (L) 06/08/2017   HCT 33.3 (L) 06/08/2017   PLT 134 (L) 06/08/2017   GLUCOSE 102 (H) 06/08/2017   CHOL 165 03/30/2017   TRIG 91 03/30/2017   HDL 24 (L) 03/30/2017   LDLDIRECT 160.9 02/12/2013  LDLCALC 123 (H) 03/30/2017   ALT 13 (L) 05/30/2017   AST 19 05/30/2017   NA 135 06/08/2017   K 3.8 06/08/2017   CL 102 06/08/2017   CREATININE 0.92 06/08/2017   BUN 16 06/08/2017   CO2 23 06/08/2017   TSH 3.911 03/29/2017   PSA 0.37 02/12/2013   INR 1.38 06/05/2017   HGBA1C 5.2 05/30/2017      PT/INR:  Recent Labs    06/05/17 1419  LABPROT 16.9*  INR 1.38   Radiology: No results found.   Assessment/Plan: S/P Procedure(s) (LRB): AORTIC VALVE REPLACEMENT (AVR) using 21mm Magna Ease valve (N/A) TRANSESOPHAGEAL ECHOCARDIOGRAM (TEE) (N/A) Plan for transfer to step-down: see transfer orders Patient has been waiting for 24 hours for transfer Possible home 1-2 days Making good progress  [name] 06/08/2017 8:17 AM

## 2017-06-08 NOTE — Op Note (Signed)
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  PROCEDURE:  Aortic valve replacement with pericardial tissue valve [employer], model 3300TFX, 25 mm, serial #[id number].  SURGEON:  Lanelle Bal, M.D.  FIRST ASSISTANT:  Nicholes Rough, PA.  BRIEF HISTORY:  The patient is a 63 year old male, who has been followed for mild dilatation of his aortic root by Dr. Stanford Breed in the fall of 2018.  The patient noted fatigue, anemia, underwent endoscopies for evaluation and then was noted to have increasing aortic insufficiency and on routine follow up CT scan of the chest, was noted to have splenic infarcts.  A TEE showed vegetations.  Blood cultures were positive for Staph epidermidis.  The patient was hemodynamically stable with no evidence of root abscess.  He was treated with a course of IV antibiotics.  At the completion, cultures were negative and because of the severe aortic insufficiency, it was recommended to the patient that we proceed with aortic valve replacement.  CT scan had evaluated the aortic root as 4.7 cm and we had considered replacing aortic root also. At the time of surgery, the aortic root was measured at 4.1 cm and ultimately, it was decided with the ascending aorta, maximum diameter of approximately 3.6 x 3.7.  Risks and options of surgery were discussed with the patient.  The use of long-term Coumadin was discussed and his preference was for a tissue valve.  Risks and options  of surgery including death, infection, stroke, myocardial infarction, bleeding, blood transfusion, possible need for permanent pacemaker were all discussed with the patient and he was agreeable.  DESCRIPTION OF PROCEDURE:  With Swan-Ganz and arterial line monitors in place, the patient underwent general endotracheal anesthesia without incident.  Skin of the chest and legs was prepped with Betadine and draped in usual sterile manner.  Appropriate time-out was performed.  We proceeded with median sternotomy.  As noted, Dr. Ermalene Postin placed TEE probe confirming severe aortic insufficiency, slight thickening of the anterior leaflet of the mitral valve, but without mitral regurgitation or mitral stenosis.  Aortic root diameter was at 4.1 cm at the sinus of Valsalva.  The patient was systemically heparinized.  Ascending aorta was cannulated.  The right atrium was cannulated.  An aortic root vent cardioplegia needle was introduced into the ascending aorta.  The patient was placed on cardiopulmonary bypass 2.4 L/min/m2.  The right superior pulmonary vein vent was placed and retrograde cardioplegia catheter was also placed.  We then proceeded to place the patient on cardiopulmonary bypass 2.4 L/min/m2 and aortic root vent cardioplegia needle was introduced into the ascending aorta.  The right superior pulmonary vein vent was placed.  The patient's body temperature  was cooled to 32 degrees.  Aortic crossclamp was applied initially with the heart beating, antegrade plegia was administered briefly with ventricular fibrillation.  We converted to retrograde cardioplegia, 1 L was administered with diastolic arrest of the heart.  Myocardial septal temperature was monitored throughout the crossclamp.  We then proceeded with a transverse aortotomy.  This gave good visualization of a trileaflet aortic valve with non-coronary leaflet with vegetation and significantly destroyed with a large hole in it.  The  aortic root itself visually was consistent with findings on TEE and it was decided not to replace the root.  Measurements in the OR with calipers of the ascending aorta and sinotubular ridge measured 3.2 to 3.4 cm.  We then proceeded with the removal of the aortic valve saving portion of the vegetation for tissue culture and the remaining for pathologic examination. Through the left ventricular outflow tract, the chordee of the mitral valve appeared intact.  There were some roughed areas on the posterior surface of the mitral valve consistent with on the TEE where the valve appeared thickened.  This was in the vicinity of the aortic insufficiency jet.  Some very small vegetations were noted on one of the secondary cords of the mitral valve.  These were removed and submitted also for culture and labeled mitral valve.  We then sized the aortic root for 25 pericardial tissue valve, #2 TiCron pledgeted sutures in a circumferential manner with the pledgets on the ventricular surface were placed.  A total of 16 of these were used to seat the prosthetic valve in place.  The valve seated well without impingement on the right or left coronary cusp.  Intermittently, we gave retrograde cardioplegia and antegrade plegia directly into the coronary ostium with firm-tipped __________ with the valve well seated.  The aortic root was then closed with a running horizontal mattress 4-0 Prolene suture over felt strips and a second layer of running 4-0 Prolene.  Before complete closure of the aortotomy, the heart was allowed to passively fill.  CO2 had been insufflated into the pericardial cavity throughout the procedure. Aortotomy was then closed.  The patient's body temperature was rewarmed. The aortic cross-clamp was removed with total cross-clamp time of 87 minutes.  18-gauge needle was introduced in the ventricular apex to further de-air the heart.  Prior to cross-clamp removal, the hotshot retrograde  cardioplegia was administered.  The patient as he rewarmed, returned to a sinus rhythm.  Atrial and ventricular pacing wires were applied to increase rate.  TEE showed a functioning aortic valve prosthesis in place with the body temperature rewarmed to 37 degrees. The patient was then ventilated.  The right superior pulmonary vein vent and retrograde cardioplegic catheters had been removed.  He was then ventilated and weaned from cardiopulmonary bypass without difficulty. He remained hemodynamically stable.  He was decannulated in usual fashion.  Protamine sulfate was administered with the operative field hemostatic.  Two Blake mediastinal drains were left in place. Pericardium was loosely reapproximated.  The sternum was then closed with #6 stainless steel wire.  Fascia was closed with interrupted 0 Vicryl, running 3-0 Vicryl subcutaneous tissue, 4-0 subcuticular stitch in skin edges.  Dry dressings were applied.  Sponge and needle count was reported as correct at the completion of the procedure.  The patient tolerated the procedure without obvious complication.  No blood products were administered during the procedure.  The patient's pump time was 128 minutes.     Lanelle Bal, MD   ______________________________ Lanelle Bal,  MD    EG/MEDQ  D:  06/07/2017  T:  06/08/2017  Job:  [id number]

## 2017-06-08 NOTE — Progress Notes (Signed)
Received pt from 2 Heart to 4E #19 at 1250.

## 2017-06-08 NOTE — Op Note (Deleted)
  The note originally documented on this encounter has been moved the the encounter in which it belongs.  

## 2017-06-09 ENCOUNTER — Other Ambulatory Visit: Payer: Self-pay | Admitting: Physician Assistant

## 2017-06-09 LAB — GLUCOSE, CAPILLARY: Glucose-Capillary: 102 mg/dL — ABNORMAL HIGH (ref 65–99)

## 2017-06-09 MED ORDER — TRAMADOL HCL 50 MG PO TABS: ORAL_TABLET | ORAL | 0 refills | Status: DC

## 2017-06-09 MED ORDER — AMIODARONE HCL 200 MG PO TABS: 200.0000 mg | ORAL_TABLET | Freq: Two times a day (BID) | ORAL | 1 refills | Status: DC

## 2017-06-09 MED ORDER — FUROSEMIDE 40 MG PO TABS: 40.0000 mg | ORAL_TABLET | Freq: Every day | ORAL | 0 refills | Status: DC

## 2017-06-09 MED ORDER — POTASSIUM CHLORIDE CRYS ER 20 MEQ PO TBCR
20.0000 meq | EXTENDED_RELEASE_TABLET | Freq: Every day | ORAL | Status: DC
  Administered 2017-06-09: 20 meq via ORAL
  Filled 2017-06-09: qty 1

## 2017-06-09 MED ORDER — ACETAMINOPHEN 500 MG PO TABS: 500.0000 mg | ORAL_TABLET | Freq: Four times a day (QID) | ORAL | 0 refills | Status: AC | PRN

## 2017-06-09 MED ORDER — METOPROLOL TARTRATE 25 MG PO TABS: 12.5000 mg | ORAL_TABLET | Freq: Two times a day (BID) | ORAL | 1 refills | Status: DC

## 2017-06-09 MED ORDER — POTASSIUM CHLORIDE CRYS ER 20 MEQ PO TBCR: 20.0000 meq | EXTENDED_RELEASE_TABLET | Freq: Every day | ORAL | 0 refills | Status: DC

## 2017-06-09 MED ORDER — FUROSEMIDE 40 MG PO TABS
40.0000 mg | ORAL_TABLET | Freq: Every day | ORAL | Status: DC
  Administered 2017-06-09: 40 mg via ORAL
  Filled 2017-06-09: qty 1

## 2017-06-09 MED ORDER — ASPIRIN 325 MG PO TBEC: 325.0000 mg | DELAYED_RELEASE_TABLET | Freq: Every day | ORAL | 0 refills | Status: DC

## 2017-06-09 NOTE — Progress Notes (Signed)
Pacer wires pulled by Kathaleen Grinder, RN.  Tolerated well.  Patient on bedrest for one hour.

## 2017-06-09 NOTE — Progress Notes (Addendum)
      [address]       [place] [address]             [phone]        4 Days Post-Op Procedure(s) (LRB): AORTIC VALVE REPLACEMENT (AVR) using 68mm Magna Ease valve (N/A) TRANSESOPHAGEAL ECHOCARDIOGRAM (TEE) (N/A)  Subjective: Patient eating breakfast. He has already had a bowel movement. He has no specific complaints.  Objective: Vital signs in last 24 hours: Temp:  [98.1 F (36.7 C)-98.6 F (37 C)] 98.1 F (36.7 C) (02/10 0630) Pulse Rate:  [77-89] 79 (02/10 0630) Cardiac Rhythm: Normal sinus rhythm (02/09 2000) Resp:  [21-34] 34 (02/10 0630) BP: (108-136)/(65-93) 131/88 (02/10 0630) SpO2:  [95 %-99 %] 97 % (02/10 0630) Weight:  [210 lb (95.3 kg)] 210 lb (95.3 kg) (02/10 0630)  Pre op weight 94.8 kg Current Weight  06/09/17 210 lb (95.3 kg)      Intake/Output from previous day: 02/09 0701 - 02/10 0700 In: 720 [P.O.:720] Out: -    Physical Exam:  Cardiovascular: RRR, no murmur Pulmonary: Clear to auscultation  Abdomen: Soft, non tender, bowel sounds present. Extremities: Mild bilateral lower extremity edema. Wound: Clean and dry.  No erythema or signs of infection.  Lab Results: CBC: Recent Labs    06/07/17 0332 06/08/17 0242  WBC 13.8* 11.1*  HGB 11.3* 11.1*  HCT 34.0* 33.3*  PLT 121* 134*   BMET:  Recent Labs    06/07/17 0332 06/08/17 0242  NA 131* 135  K 3.8 3.8  CL 100* 102  CO2 22 23  GLUCOSE 136* 102*  BUN 17 16  CREATININE 0.89 0.92  CALCIUM 8.0* 8.1*    PT/INR:  Lab Results  Component Value Date   INR 1.38 06/05/2017   INR 1.04 05/30/2017   INR 1.00 04/18/2017   ABG:  INR: Will add last result for INR, ABG once components are confirmed Will add last 4 CBG results once components are confirmed  Assessment/Plan:  1. CV - Previous a fib. Maintaining SR in the 80's. On Amiodarone 200 mg bid, Lopressor 12.5 mg bid. 2.  Pulmonary - On room air. Encourage incentive spirometer. 3. Volume Overload - Will  give Lasix today 4.  Acute blood loss anemia - H and H stable 11.1 and 33.3 5. Mild thrombocytopenia-last platelets 134,000 6. CBGs 111/107/102. Pre op HGA1C 5.2. Stop accu checks and SS 7. Remove EPW 8. Possible discharge later today   Donielle M ZimmermanPA-C 06/09/2017,7:35 AM  Feels well Cultures are negative  Patient wants to go home, appropriate for de/c today  I have seen and examined [name] and agree with the above assessment  and plan.  [name] MD [facility] [phone] Office [phone] 06/09/2017 12:40 PM

## 2017-06-09 NOTE — Plan of Care (Signed)
Reviewed all interventions, patient is progressing

## 2017-06-09 NOTE — Discharge Instructions (Signed)
Aortic Valve Replacement, Care After °Refer to this sheet in the next few weeks. These instructions provide you with information about caring for yourself after your procedure. Your health care provider may also give you more specific instructions. Your treatment has been planned according to current medical practices, but problems sometimes occur. Call your health care provider if you have any problems or questions after your procedure. °What can I expect after the procedure? °After the procedure, it is common to have: °· Pain around your incision area. °· A small amount of blood or clear fluid coming from your incision. ° °Follow these instructions at home: °Eating and drinking ° °· Follow instructions from your health care provider about eating or drinking restrictions. °? Limit alcohol intake to no more than 1 drink per day for nonpregnant women and 2 drinks per day for men. One drink equals 12 oz of beer, 5 oz of wine, or 1½ oz of hard liquor. °? Limit how much caffeine you drink. Caffeine can affect your heart's rate and rhythm. °· Drink enough fluid to keep your urine clear or pale yellow. °· Eat a heart-healthy diet. This should include plenty of fresh fruits and vegetables. If you eat meat, it should be lean cuts. Avoid foods that are: °? High in salt, saturated fat, or sugar. °? Canned or highly processed. °? Fried. °Activity °· Return to your normal activities as told by your health care provider. Ask your health care provider what activities are safe for you. °· Exercise regularly once you have recovered, as told by your health care provider. °· Avoid sitting for more than 2 hours at a time without moving. Get up and move around at least once every 1-2 hours. This helps to prevent blood clots in the legs. °· Do not lift anything that is heavier than 10 lb (4.5 kg) until your health care provider approves. °· Avoid pushing or pulling things with your arms until your health care provider approves. This  includes pulling on handrails to help you climb stairs. °Incision care ° °· Follow instructions from your health care provider about how to take care of your incision. Make sure you: °? Wash your hands with soap and water before you change your bandage (dressing). If soap and water are not available, use hand sanitizer. °? Change your dressing as told by your health care provider. °? Leave stitches (sutures), skin glue, or adhesive strips in place. These skin closures may need to stay in place for 2 weeks or longer. If adhesive strip edges start to loosen and curl up, you may trim the loose edges. Do not remove adhesive strips completely unless your health care provider tells you to do that. °· Check your incision area every day for signs of infection. Check for: °? More redness, swelling, or pain. °? More fluid or blood. °? Warmth. °? Pus or a bad smell. °Medicines °· Take over-the-counter and prescription medicines only as told by your health care provider. °· If you were prescribed an antibiotic medicine, take it as told by your health care provider. Do not stop taking the antibiotic even if you start to feel better. °Travel °· Avoid airplane travel for as long as told by your health care provider. °· When you travel, bring a list of your medicines and a record of your medical history with you. Carry your medicines with you. °Driving °· Ask your health care provider when it is safe for you to drive. Do not drive until your health   care provider approves.  Do not drive or operate heavy machinery while taking prescription pain medicine. Lifestyle   Do not use any tobacco products, such as cigarettes, chewing tobacco, or e-cigarettes. If you need help quitting, ask your health care provider.  Resume sexual activity as told by your health care provider. Do not use medicines for erectile dysfunction unless your health care provider approves, if this applies.  Work with your health care provider to keep your  blood pressure and cholesterol under control, and to manage any other heart conditions that you have.  Maintain a healthy weight. General instructions  Do not take baths, swim, or use a hot tub until your health care provider approves.  Do not strain to have a bowel movement.  Avoid crossing your legs while sitting down.  Check your temperature every day for a fever. A fever may be a sign of infection.  If you are a woman and you plan to become pregnant, talk with your health care provider before you become pregnant.  Wear compression stockings if your health care provider instructs you to do this. These stockings help to prevent blood clots and reduce swelling in your legs.  Tell all health care providers who care for you that you have an artificial (prosthetic) aortic valve. If you have or have had heart disease or endocarditis, tell all health care providers about these conditions as well.  Keep all follow-up visits as told by your health care provider. This is important. Contact a health care provider if:  You develop a skin rash.  You experience sudden, unexplained changes in your weight.  You have more redness, swelling, or pain around your incision.  You have more fluid or blood coming from your incision.  Your incision feels warm to the touch.  You have pus or a bad smell coming from your incision.  You have a fever. Get help right away if:  You develop chest pain that is different from the pain coming from your incision.  You develop shortness of breath or difficulty breathing.  You start to feel light-headed. These symptoms may represent a serious problem that is an emergency. Do not wait to see if the symptoms will go away. Get medical help right away. Call your local emergency services (911 in the [place] Do not drive yourself to the hospital. This information is not intended to replace advice given to you by your health care provider. Make sure you discuss any  questions you have with your health care provider. Document Released: 11/02/2004 Document Revised: 09/22/2015 Document Reviewed: 03/20/2015 Elsevier Interactive Patient Education  2017 [employer]  Endocarditis is a potentially serious infection of heart valves or inside lining of the heart.  It occurs more commonly in patients with diseased heart valves (such as patient's with aortic or mitral valve disease) and in patients who have undergone heart valve repair or replacement.  Certain surgical and dental procedures may put you at risk, such as dental cleaning, other dental procedures, or any surgery involving the respiratory, urinary, gastrointestinal tract, gallbladder or prostate gland.   To minimize your chances for develooping endocarditis, maintain good oral health and seek prompt medical attention for any infections involving the mouth, teeth, gums, skin or urinary tract.    Always notify your doctor or dentist about your underlying heart valve condition before having any invasive procedures. You will need to take antibiotics before certain procedures, including all routine dental cleanings or other dental procedures.  Your cardiologist or dentist  should prescribe these antibiotics for you to be taken ahead of time.

## 2017-06-09 NOTE — Care Management Note (Signed)
Case Management Note  Patient Details  Name: [name] Anderson MRN: [id number] Date of Birth: 1955-10-27  Subjective/Objective:      Pt is s/p AVR             Action/Plan:  Pt is independent from home with wife.  Pt has PCP and denied barrier to obtaining or prescribing meds. Pt just finished course of IV antibiotics PTA - [facility] has closed case.  Pt may go home with IV antibiotics - [facility] aware to follow   Expected Discharge Date:  06/09/17               Expected Discharge Plan:  Home/Self Care  In-House Referral:  NA  Discharge planning Services  CM Consult  Post Acute Care Choice:  NA Choice offered to:  NA  DME Arranged:    DME Agency:     HH Arranged:    [place] Agency:     Status of Service:  Completed, signed off  If discussed at [employer] of Stay Meetings, dates discussed:    Discharge Disposition: home/self care   Additional Comments:  06/09/17- 1100- Arun Herrod rN, cM- pt for d/c home today- no CM needs noted for transition home- pt to go home on po abx.   Dahlia Client [place], RN 06/09/2017, 11:16 AM [phone] 4E Case Management

## 2017-06-09 NOTE — Progress Notes (Signed)
Discharge instructions given,  Discussed new medications, and discontinued medications.  Discussed pain medications and reinforced activities.  Discussed signs and symptoms of infection and to call MD right away for any problems.  Patient and spouse verbalized understanding.

## 2017-06-09 NOTE — Progress Notes (Signed)
Anterior chest sutures removed, cleaned with betadine, steri strips applied.  Patient tolerated well.

## 2017-06-10 ENCOUNTER — Telehealth: Payer: Self-pay | Admitting: Infectious Diseases

## 2017-06-10 ENCOUNTER — Telehealth (HOSPITAL_COMMUNITY): Payer: Self-pay | Admitting: *Deleted

## 2017-06-10 LAB — AEROBIC/ANAEROBIC CULTURE W GRAM STAIN (SURGICAL/DEEP WOUND)
Culture: NO GROWTH
Culture: NO GROWTH

## 2017-06-11 ENCOUNTER — Other Ambulatory Visit: Payer: Self-pay | Admitting: Physician Assistant

## 2017-06-11 MED FILL — Heparin Sodium (Porcine) Inj 1000 Unit/ML: INTRAMUSCULAR | Qty: 30 | Status: AC

## 2017-06-11 MED FILL — Dextrose Inj 5%: INTRAVENOUS | Qty: 1000 | Status: AC

## 2017-06-11 MED FILL — Heparin Sodium (Porcine) Inj 1000 Unit/ML: INTRAMUSCULAR | Qty: 20 | Status: AC

## 2017-06-11 MED FILL — Electrolyte-R (PH 7.4) Solution: INTRAVENOUS | Qty: 4000 | Status: AC

## 2017-06-11 MED FILL — Magnesium Sulfate Inj 50%: INTRAMUSCULAR | Qty: 10 | Status: AC

## 2017-06-11 MED FILL — Lidocaine HCl IV Inj 20 MG/ML: INTRAVENOUS | Qty: 10 | Status: AC

## 2017-06-11 MED FILL — Norepinephrine Bitartrate IV Soln 1 MG/ML (Base Equivalent): INTRAVENOUS | Qty: 4 | Status: AC

## 2017-06-11 MED FILL — Sodium Bicarbonate IV Soln 8.4%: INTRAVENOUS | Qty: 50 | Status: AC

## 2017-06-11 MED FILL — Potassium Chloride Inj 2 mEq/ML: INTRAVENOUS | Qty: 40 | Status: AC

## 2017-06-11 MED FILL — Heparin Sodium (Porcine) Inj 1000 Unit/ML: INTRAMUSCULAR | Qty: 60 | Status: CN

## 2017-06-11 MED FILL — Sodium Chloride IV Soln 0.9%: INTRAVENOUS | Qty: 2000 | Status: AC

## 2017-06-11 MED FILL — Mannitol IV Soln 20%: INTRAVENOUS | Qty: 500 | Status: AC

## 2017-06-11 NOTE — Telephone Encounter (Signed)
error 

## 2017-06-13 ENCOUNTER — Encounter (HOSPITAL_COMMUNITY): Payer: Self-pay | Admitting: Cardiothoracic Surgery

## 2017-06-26 ENCOUNTER — Ambulatory Visit: Payer: 59 | Admitting: Physician Assistant

## 2017-06-26 ENCOUNTER — Encounter: Payer: Self-pay | Admitting: Physician Assistant

## 2017-06-26 VITALS — BP 138/90 | HR 72 | Ht 71.0 in | Wt 206.0 lb

## 2017-06-26 DIAGNOSIS — I712 Thoracic aortic aneurysm, without rupture, unspecified: Secondary | ICD-10-CM

## 2017-06-26 DIAGNOSIS — I330 Acute and subacute infective endocarditis: Secondary | ICD-10-CM

## 2017-06-26 DIAGNOSIS — E785 Hyperlipidemia, unspecified: Secondary | ICD-10-CM | POA: Diagnosis not present

## 2017-06-26 DIAGNOSIS — Z952 Presence of prosthetic heart valve: Secondary | ICD-10-CM | POA: Diagnosis not present

## 2017-06-26 DIAGNOSIS — I359 Nonrheumatic aortic valve disorder, unspecified: Secondary | ICD-10-CM | POA: Diagnosis not present

## 2017-06-26 NOTE — Patient Instructions (Signed)
Medication Instructions: Your physician recommends that you continue on your current medications as directed. Please refer to the Current Medication list given to you today.  If you need a refill on your cardiac medications before your next appointment, please call your pharmacy.   Follow-Up: Your physician wants you to keep your follow up on 3/25 with Dr. Stanford Breed.  Thank you for choosing [facility] at [facility]!!

## 2017-06-26 NOTE — Progress Notes (Signed)
Cardiology Office Note    Date:  06/28/2017   ID:  [name] Anderson, DOB December 23, 1955, MRN [id number]  PCP:  Antony Contras, MD  Cardiologist:  Dr. Stanford Breed  Chief Complaint  Patient presents with  . Follow-up    no chest pain-just chest soreness    History of Present Illness:  [name] Anderson is a 62 y.o. male with PMH of testicular cancer, HLD, mild OSA not on CPAP, moderate AR and dilated aortic root. CTA of the chest obtained on 03/29/2016 showed stable dilatation of aortic root at 4.7 cm. Echo obtained on 04/13/2016 showed normal EF, mild LVH, grade 1 DD, mild-to-moderate AI, aortic root 47 mm, mild MR. CTA in November 2016 showed dilated aortic root at 4.8 cm.   I last saw the patient in August 2018 for syncopal episode.  I ordered a cardiac event monitor along with echocardiogram and CTA of the chest.  CT of the chest showed aortic root measuring 4.7 cm, there is also a wedge-shaped lesion in the spleen question splenic infarct.  However patient was completely asymptomatic for any abdominal discomfort.  When he was seen by Dr. Stanford Breed back on 03/28/2017, he did complain of transient double vision.  TEE performed in November 2018 showed normal LV function, aortic valve vegetation on the left coronary cusp, severe AI, mildly dilated aortic root, redundant mitral valve chordae and associated vegetation could not be excluded.  Blood culture grew coag negative staph.  He was placed on antibiotic with plan for aortic valve and aortic root replacement after completing 6 weeks of antibiotic.  Cardiac catheterization in December 2018 showed normal coronaries and normal left and right her pressure.  He eventually underwent aortic valve replacement using 25 mm Magna Ease valve.  Although there was discussion of replacing the aortic root as well, however at the time of surgery, the aortic root was measuring only 4.1 cm and ultimately was decided to the ascending aorta with the maximum diameter was approximately  3.6 x 3.7.  The hospital course was complicated by postop atrial fibrillation and he was placed on oral amiodarone.  Patient presents today for cardiology office visit.  He has been doing well since his discharge.  He is able to walk up to a hour at this point.  He denies any significant chest discomfort or shortness of breath.  He denies any significant palpitation.  He does have trace amount of lower extremity edema on physical exam, however his lung is clear.  I do not think he need a scheduled diuretic at this time.  I will continue him on the current medication.  If the swelling does become worse, we can potentially consider adding a low-dose hydrochlorothiazide.  His EKG does show T wave inversion in anterior leads, however he is completely asymptomatic.  Given the recent clean cath, I do not think any further workup is warranted in this case.  I did give him a copy of his EKG to keep in his wallet.  He does have a heart murmur near the aortic valve, however it is very mild.  It is still unclear to me what exactly caused the endocarditis, however he is aware that he need to seek urgent medical attention if he does have fever or chills again.   Past Medical History:  Diagnosis Date  . Allergy   . Anemia   . Barrett's esophagus - short segment 03/13/2017  . Cancer ([place]) 09/11/13   testicular  . Complication of anesthesia  hard to wake  - 20 years ago  . ED (erectile dysfunction)   . Endocarditis    [facility] 03/29/2017  . H/O aneurysm 03/05/2017  . High cholesterol   . History of basal cell carcinoma excision    02/2002   --  NOSE  &   2013  RIGHT EAR  . History of kidney stones   . Hyperlipidemia   . Mild obstructive sleep apnea    PER STUDY 01-18-2005--  NO CPAP OR MOUTH GUARD  . Moderate aortic valve insufficiency   . Multiple pulmonary nodules    PER CT--  PROBABLE  GRANULOMATOUS - patient is not aware of this  . Personal history of colonic polyps    TUBULAR ADENOMA--   . Seasonal  allergies   . 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[place])   . Testicular mass    RIGHT    Past Surgical History:  Procedure Laterality Date  . AORTIC VALVE REPLACEMENT N/A 06/05/2017   Procedure: AORTIC VALVE REPLACEMENT (AVR) using 20mm Magna Ease valve;  Surgeon: [name], MD;  Location: [facility];  Service: Open Heart Surgery;  Laterality: N/A;  . COLONOSCOPY  01/24/06, 07/10/11  . COLONOSCOPY WITH ESOPHAGOGASTRODUODENOSCOPY (EGD)    . EXTRACORPOREAL SHOCK WAVE LITHOTRIPSY    . HERNIA REPAIR    . MOHS SURGERY  02/2002  &  2013   TIP OF NOSE-///     RIGHT EAR  . ORCHIECTOMY Right 09/11/2013   Procedure:  RIGHT ORCHIECTOMY;  Surgeon: Jorja Loa, MD;  [facility];  Service: Urology;  Laterality: Right;  . RIGHT/LEFT HEART CATH AND CORONARY ANGIOGRAPHY N/A 04/18/2017   Procedure: RIGHT/LEFT HEART CATH AND CORONARY ANGIOGRAPHY;  Surgeon: Sherren Mocha, MD;  Location: [facility];  Service: Cardiovascular;  Laterality: N/A;  . SCROTAL EXPLORATION Right 09/11/2013   Procedure: RIGHT INGUINAL EXPLORATION;  Surgeon: Jorja Loa, MD;  Location[facility];  Service: Urology;  Laterality: Right;  . TEE WITHOUT CARDIOVERSION N/A 03/29/2017   Procedure: TRANSESOPHAGEAL ECHOCARDIOGRAM (TEE);  Surgeon: Lelon Perla, MD;  Location: [facility] ENDOSCOPY;  Service: Cardiovascular;  Laterality: N/A;  . TEE WITHOUT CARDIOVERSION N/A 06/05/2017   Procedure: TRANSESOPHAGEAL ECHOCARDIOGRAM (TEE);  Surgeon: [name], MD;  Location: [facility];  Service: Open Heart Surgery;  Laterality: N/A;  . TRANSTHORACIC ECHOCARDIOGRAM  02-25-2013   DR CRENSHAW   NORMAL LVSF/  EF 55-60%/   GRADE 2 DIASTOLIC DYSFUNCTION/  ECCENTRIC MODERATE AORTIC INSUFFICIENCY WITHOUT STENOSIS/  MILD DILATED AORTIC ROOT/ TRIVIAL MR, TR,  & PR  . UMBILICAL HERNIA REPAIR  06-08-2009  . URETEROSCOPIC LASER LITHOTRIPSY STONE EXTRACTION  2007    Current Medications: Outpatient Medications  Prior to Visit  Medication Sig Dispense Refill  . acetaminophen (TYLENOL) 500 MG tablet Take 1 tablet (500 mg total) by mouth every 6 (six) hours as needed (for pain.). 30 tablet 0  . aluminum chloride (HYPERCARE) 20 % external solution Apply 1 application topically 3 (three) times daily as needed (for sweating.).    Marland Kitchen amiodarone (PACERONE) 200 MG tablet Take 1 tablet (200 mg total) by mouth every 12 (twelve) hours. For 5 days then take Amiodarone 200 mg by mouth daily thereafter. 60 tablet 1  . amoxicillin (AMOXIL) 500 MG capsule TAKE 4 TABLETS BY MOUTH 1 HOUR PRIOR TO DENTAL APPOINTMENT 4 capsule 1  . Ascorbic Acid (VITAMIN C) 1000 MG tablet Take 1,000 mg by mouth daily.    Marland Kitchen aspirin EC 325 MG EC tablet  Take 1 tablet (325 mg total) by mouth daily. 30 tablet 0  . atorvastatin (LIPITOR) 10 MG tablet Take 1 tablet (10 mg total) by mouth daily at 6 PM. 30 tablet 6  . betamethasone valerate (VALISONE) 0.1 % cream Apply 1 application topically 2 (two) times daily as needed (for skin irritation.).    Marland Kitchen cetirizine (ZYRTEC) 10 MG tablet Take 10 mg by mouth daily as needed for allergies.    Marland Kitchen ECHINACEA PO Take 760 mg by mouth daily.    . fluorometholone (FML) 0.1 % ophthalmic suspension Place 1 drop into both eyes 3 (three) times daily as needed (for irritated/itchy eyes.).    Marland Kitchen Glucosamine-Chondroitin (COSAMIN DS PO) Take 1 tablet by mouth 2 (two) times daily.    Marland Kitchen loratadine (CLARITIN) 10 MG tablet Take 10 mg by mouth daily as needed for allergies.    . metoprolol tartrate (LOPRESSOR) 25 MG tablet Take 0.5 tablets (12.5 mg total) by mouth 2 (two) times daily. 30 tablet 1  . Naphazoline-Pheniramine (OPCON-A) 0.027-0.315 % SOLN Place 1-2 drops into both eyes 3 (three) times daily as needed (for allergy eyes.).    Marland Kitchen NON FORMULARY Take 2 capsules by mouth 2 (two) times daily. DoTerra (Microplex VMz) 2 Capsule twice daily    . NON FORMULARY Take 2 capsules by mouth 2 (two) times daily. XE0 Mega "Doterra"      . sildenafil (REVATIO) 20 MG tablet Take 20 mg by mouth daily as needed (for ED).     . SUPER B COMPLEX/C PO Take 1 tablet by mouth daily.    . traMADol (ULTRAM) 50 MG tablet Take 50 mg by mouth every 4-6 hours PRN moderate pain. 30 tablet 0  . triamcinolone cream (KENALOG) 0.1 % Apply 1 application topically daily as needed (FOR DRY SKIN/ITCHY SKIN.). Apply to area after bath (DO NOT APPLY TO FACE)  1  . [employer] TRIPLE MAGNESIUM COMPLEX PO Take 1 tablet by mouth 2 (two) times daily.    . Ferrous Sulfate (IRON) 325 (65 Fe) MG TABS Take 325 mg by mouth daily.     . furosemide (LASIX) 40 MG tablet Take 1 tablet (40 mg total) by mouth daily. For 5 days then stop. (Patient not taking: Reported on 06/26/2017) 5 tablet 0  . potassium chloride SA (K-DUR,KLOR-CON) 20 MEQ tablet Take 1 tablet (20 mEq total) by mouth daily. For 5 days then stop. (Patient not taking: Reported on 06/26/2017) 5 tablet 0   No facility-administered medications prior to visit.      Allergies:   Patient has no known allergies.   Social History   Socioeconomic History  . Marital status: Married    Spouse name: [name] Anderson  . Number of children: 2  . Years of education: 83  . Highest education level: None  Social Needs  . Financial resource strain: None  . Food insecurity - worry: None  . Food insecurity - inability: None  . Transportation needs - medical: None  . Transportation needs - non-medical: None  Occupational History  . 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 Smoking status: Current Some Day Smoker    Types: Pipe  . Smokeless tobacco: Former Systems developer    Types: [place] date: 04/26/2011  . Tobacco comment: OCCASIONAL  PIPE SMOKER  (NEVER SMOKED CIGARETTES)  Substance and Sexual Activity  . Alcohol use: Yes    Comment: 03/29/2017 "might have a glass of wine twice/year"  . Drug use: No  .  Sexual activity: Yes  Other Topics Concern  . None  Social History Narrative   Lives with wife [name] Anderson   Caffeine use:  coffee- 1 cup per day     Family History:  The patient's family history includes Colon cancer in his father; Heart disease in his paternal grandmother.   ROS:   Please see the history of present illness.    ROS All other systems reviewed and are negative.   PHYSICAL EXAM:   VS:  BP 138/90   Pulse 72   Ht 5\' 11"  (1.803 m)   Wt 206 lb (93.4 kg)   BMI 28.73 kg/m    GEN: Well nourished, well developed, in no acute distress  HEENT: normal  Neck: no JVD, carotid bruits, or masses Cardiac: RRR; no rubs, or gallops. Trace edema. 1/6 systolic murmur at right upper sternal border Respiratory:  clear to auscultation bilaterally, normal work of breathing GI: soft, nontender, nondistended, + BS MS: no deformity or atrophy  Skin: warm and dry, no rash Neuro:  Alert and Oriented x 3, Strength and sensation are intact Psych: euthymic mood, full affect  Wt Readings from Last 3 Encounters:  06/26/17 206 lb (93.4 kg)  06/09/17 210 lb (95.3 kg)  05/30/17 209 lb (94.8 kg)      Studies/Labs Reviewed:   EKG:  EKG is ordered today.  The ekg ordered today demonstrates EKG showed normal sinus rhythm with T wave inversion in anterior leads.  Recent Labs: 03/29/2017: TSH 3.911 05/30/2017: ALT 13 06/06/2017: Magnesium 2.1 06/08/2017: BUN 16; Creatinine, Ser 0.92; Hemoglobin 11.1; Platelets 134; Potassium 3.8; Sodium 135   Lipid Panel    Component Value Date/Time   CHOL 165 03/30/2017 0353   TRIG 91 03/30/2017 0353   HDL 24 (L) 03/30/2017 0353   CHOLHDL 6.9 03/30/2017 0353   VLDL 18 03/30/2017 0353   LDLCALC 123 (H) 03/30/2017 0353   LDLDIRECT 160.9 02/12/2013 0734    Additional studies/ records that were reviewed today include:   Cath 04/18/2017 Conclusion   1. Widely patent coronary arteries (right dominant) 2. Normal right and left heart pressures      ASSESSMENT:    1. S/P AVR   2. AVD (aortic valve disease)   3. Bacterial endocarditis, unspecified chronicity   4.  Hyperlipidemia, unspecified hyperlipidemia type   5. Thoracic aortic aneurysm without rupture ([place])      PLAN:  In order of problems listed above:  1. S/p bioprosthetic aortic valve replacement: In the setting of bacterial endocarditis. SBE prophylaxis.  He is stable after his surgery.  Still have very mild murmur in aortic area, however denies any significant shortness of breath.  He has trace amount of swelling in bilateral lower extremity, this does not need scheduled diuretic.  2. Bacterial endocarditis: Unclear etiology.  Found incidentally when he underwent CT angiogram of the chest for his thoracic aortic aneurysm which revealed infarcted spleen.  He was surprisingly asymptomatic from this.  He later developed vision issue and underwent TEE that revealed endocarditis.  3. Thoracic aortic aneurysm: He did not undergo aortic root repair during the aortic valve replacement surgery as the actual aortic dilatation is smaller than previously measured on the CT.  4. Postop atrial fibrillation: Continue amiodarone, once he finished the current bottle, he will discontinue the amiodarone.  Unless atrial fibrillation recurs again, no plan to start on systemic anticoagulation   5. Hyperlipidemia: Continue Lipitor    Medication Adjustments/Labs and Tests Ordered: Current medicines are reviewed at  length with the patient today.  Concerns regarding medicines are outlined above.  Medication changes, Labs and Tests ordered today are listed in the Patient Instructions below. Patient Instructions  Medication Instructions: Your physician recommends that you continue on your current medications as directed. Please refer to the Current Medication list given to you today.  If you need a refill on your cardiac medications before your next appointment, please call your pharmacy.   Follow-Up: Your physician wants you to keep your follow up on 3/25 with Dr. Stanford Breed.  Thank you for choosing Heartcare at  [facility], Almyra [place], [place]  06/28/2017 7:47 AM    [address], [place], [place]  [address] Phone: [phone]; Fax: [phone]

## 2017-06-28 ENCOUNTER — Encounter: Payer: Self-pay | Admitting: Physician Assistant

## 2017-06-28 DIAGNOSIS — N200 Calculus of kidney: Secondary | ICD-10-CM | POA: Diagnosis not present

## 2017-06-29 LAB — ECHO TEE
AO mean calculated velocity dopler: 120 cm/s
AV Area VTI index: 2.32 cm2/m2
AV Area VTI: 4.53 cm2
AV Area mean vel: 4.37 cm2
AV Mean grad: 6 mmHg
AV Peak grad: 11 mmHg
AV VEL mean LVOT/AV: 0.66
AV area mean vel ind: 2.03 cm2/m2
AV peak Index: 2.11
AV pk vel: 165 cm/s
AV vel: 4.99
Ao pk vel: 0.68 m/s
LVOT MV VTI INDEX: 1 cm2/m2
LVOT MV VTI: 2.15
LVOT SV: 140 mL
LVOT VTI: 21.2 cm
LVOT area: 6.61 cm2
LVOT diameter: 29 mm
LVOT peak VTI: 0.75 cm
LVOT peak grad rest: 5 mmHg
LVOT peak vel: 113 cm/s
MV Annulus VTI: 65.2 cm
MV M vel: 129
Mean grad: 8 mmHg
P 1/2 time: 561 ms
VTI: 28.1 cm
Valve area index: 2.32
Valve area: 4.99 cm2

## 2017-07-01 ENCOUNTER — Telehealth (HOSPITAL_COMMUNITY): Payer: Self-pay

## 2017-07-01 NOTE — Telephone Encounter (Signed)
Patients insurance is active and benefits verified through [facility] - $25.00 co-pay, no deductible, out of pocket amount of $3,000/$3,000 has been met, no co-insurance, and no pre-authorization is required. Passport/reference [phone]

## 2017-07-03 DIAGNOSIS — Z87442 Personal history of urinary calculi: Secondary | ICD-10-CM | POA: Diagnosis not present

## 2017-07-04 ENCOUNTER — Telehealth (HOSPITAL_COMMUNITY): Payer: Self-pay

## 2017-07-04 NOTE — Telephone Encounter (Signed)
Called to speak with patient in regards to cardiac rehab - scheduled orientation on 08/20/2017 at 8:00am. Patient will attend the 8;15am exc class. Went over insurance with patient and patient verbally stated he understands. Mailed packet.

## 2017-07-08 ENCOUNTER — Other Ambulatory Visit: Payer: Self-pay | Admitting: Cardiothoracic Surgery

## 2017-07-08 ENCOUNTER — Ambulatory Visit
Admission: RE | Admit: 2017-07-08 | Discharge: 2017-07-08 | Disposition: A | Discharge status: Home / Self Care | Payer: 59 | Source: Ambulatory Visit | Attending: Cardiothoracic Surgery | Admitting: Cardiothoracic Surgery

## 2017-07-08 ENCOUNTER — Ambulatory Visit (INDEPENDENT_AMBULATORY_CARE_PROVIDER_SITE_OTHER): Payer: Self-pay | Admitting: Physician Assistant

## 2017-07-08 VITALS — BP 132/92 | HR 72 | Resp 20 | Ht 71.0 in | Wt 202.0 lb

## 2017-07-08 DIAGNOSIS — Z952 Presence of prosthetic heart valve: Secondary | ICD-10-CM

## 2017-07-08 DIAGNOSIS — I351 Nonrheumatic aortic (valve) insufficiency: Secondary | ICD-10-CM

## 2017-07-08 DIAGNOSIS — I358 Other nonrheumatic aortic valve disorders: Secondary | ICD-10-CM

## 2017-07-08 DIAGNOSIS — I359 Nonrheumatic aortic valve disorder, unspecified: Secondary | ICD-10-CM

## 2017-07-08 DIAGNOSIS — Z954 Presence of other heart-valve replacement: Secondary | ICD-10-CM | POA: Diagnosis not present

## 2017-07-08 NOTE — Progress Notes (Signed)
HPI:   Patient returns for routine postoperative follow-up having undergone AVR on 06/05/2017.  The patient's early postoperative recovery while in the hospital was notable for Atrial Fibrillation.  He converted to NSR with use of Amiodarone.  Since hospital discharge the patient reports he is doing well.  He continues to have some minor chest discomfort but is no longer taking pain medication.  He is ambulating 3 times per day 20-25 min increments without difficulty.  His weight has been stable and he has no lower extremity edema.  He states his BP has been getting in the 140s the past several days.  He does question when he can drive, increase his activity, and if he can put scar cream on his incisions.   Current Outpatient Medications  Medication Sig Dispense Refill  . acetaminophen (TYLENOL) 500 MG tablet Take 1 tablet (500 mg total) by mouth every 6 (six) hours as needed (for pain.). 30 tablet 0  . aluminum chloride (HYPERCARE) 20 % external solution Apply 1 application topically 3 (three) times daily as needed (for sweating.).    Marland Kitchen amiodarone (PACERONE) 200 MG tablet Take 1 tablet (200 mg total) by mouth every 12 (twelve) hours. For 5 days then take Amiodarone 200 mg by mouth daily thereafter. 60 tablet 1  . amoxicillin (AMOXIL) 500 MG capsule TAKE 4 TABLETS BY MOUTH 1 HOUR PRIOR TO DENTAL APPOINTMENT 4 capsule 1  . Ascorbic Acid (VITAMIN C) 1000 MG tablet Take 1,000 mg by mouth daily.    Marland Kitchen aspirin EC 325 MG EC tablet Take 1 tablet (325 mg total) by mouth daily. 30 tablet 0  . atorvastatin (LIPITOR) 10 MG tablet Take 1 tablet (10 mg total) by mouth daily at 6 PM. 30 tablet 6  . betamethasone valerate (VALISONE) 0.1 % cream Apply 1 application topically 2 (two) times daily as needed (for skin irritation.).    Marland Kitchen cetirizine (ZYRTEC) 10 MG tablet Take 10 mg by mouth daily as needed for allergies.    Marland Kitchen ECHINACEA PO Take 760 mg by mouth daily.    . fluorometholone (FML) 0.1 % ophthalmic suspension  Place 1 drop into both eyes 3 (three) times daily as needed (for irritated/itchy eyes.).    Marland Kitchen Glucosamine-Chondroitin (COSAMIN DS PO) Take 1 tablet by mouth 2 (two) times daily.    Marland Kitchen loratadine (CLARITIN) 10 MG tablet Take 10 mg by mouth daily as needed for allergies.    . metoprolol tartrate (LOPRESSOR) 25 MG tablet Take 0.5 tablets (12.5 mg total) by mouth 2 (two) times daily. 30 tablet 1  . Naphazoline-Pheniramine (OPCON-A) 0.027-0.315 % SOLN Place 1-2 drops into both eyes 3 (three) times daily as needed (for allergy eyes.).    Marland Kitchen NON FORMULARY Take 2 capsules by mouth 2 (two) times daily. DoTerra (Microplex VMz) 2 Capsule twice daily    . NON FORMULARY Take 2 capsules by mouth 2 (two) times daily. XE0 Mega "Doterra"    . sildenafil (REVATIO) 20 MG tablet Take 20 mg by mouth daily as needed (for ED).     . SUPER B COMPLEX/C PO Take 1 tablet by mouth daily.    . traMADol (ULTRAM) 50 MG tablet Take 50 mg by mouth every 4-6 hours PRN moderate pain. 30 tablet 0  . triamcinolone cream (KENALOG) 0.1 % Apply 1 application topically daily as needed (FOR DRY SKIN/ITCHY SKIN.). Apply to area after bath (DO NOT APPLY TO FACE)  1   No current facility-administered medications for this visit.  Physical Exam:  BP (!) 132/92   Pulse 72   Resp 20   Ht 5\' 11"  (1.803 m)   Wt 202 lb (91.6 kg)   SpO2 98% Comment: RA  BMI 28.17 kg/m   Gen: no apparent distress Heart: RRR, + systolic murmur 2/6 Lungs: CTA bilaterally Ext: no edema Incision: well healed  Diagnostic Tests:  CXR: no pleural effusion, pneumothorax, stable post surgical changes   A/P:  1. S/P AVR- doing very well, patient given instructions regarding endocarditis prophylaxis 2. Atrial Fibrillation- has been maintaining NSR since hospital discharge.  Can likely stop Amiodarone when he sees Cardiology on the 25th 3. HTN- pressure is 132/90 today, which is reasonable on Metoprolol 25 mg BID.Marland Kitchen Patient instructed to keep a log of blood  pressure when he takes it.  If remains elevated can restart home Cozaar at next appointment 4. Increase activity with weight restrictions of 10-15 lbs remain in place.  May resume driving short distances 5. Dispo- doing well S/P AVR, maintaining NSR today, can hopefully stop Amiodarone on the 25th, decrease ASA to 81 mg daily, RTC in August for 6 month check with Dr. Damita Dunnings, PA-C [facility] [phone]

## 2017-07-08 NOTE — Patient Instructions (Signed)
You may return to driving an automobile as long as you are no longer requiring oral narcotic pain relievers during the daytime.  It would be wise to start driving only short distances during the daylight and gradually increase from there as you feel comfortable.  You may continue to gradually increase your physical activity as tolerated.  Refrain from any heavy lifting or strenuous use of your arms and shoulders until at least 8 weeks from the time of your surgery, and avoid activities that cause increased pain in your chest on the side of your surgical incision.  Otherwise you may continue to increase activities without any particular limitations.  Increase the intensity and duration of physical activity gradually.   Endocarditis is a potentially serious infection of heart valves or inside lining of the heart.  It occurs more commonly in patients with diseased heart valves (such as patient's with aortic or mitral valve disease) and in patients who have undergone heart valve repair or replacement.  Certain surgical and dental procedures may put you at risk, such as dental cleaning, other dental procedures, or any surgery involving the respiratory, urinary, gastrointestinal tract, gallbladder or prostate gland.   To minimize your chances for develooping endocarditis, maintain good oral health and seek prompt medical attention for any infections involving the mouth, teeth, gums, skin or urinary tract.    Always notify your doctor or dentist about your underlying heart valve condition before having any invasive procedures. You will need to take antibiotics before certain procedures, including all routine dental cleanings or other dental procedures.  Your cardiologist or dentist should prescribe these antibiotics for you to be taken ahead of time.        

## 2017-07-15 NOTE — Progress Notes (Signed)
HPI: FU AVR.Echocardiogram repeated November 2018. Ejection fraction 50-55%, severe left ventricular enlargement, grade 1 diastolic dysfunction, severe aortic insufficiency, dilated aortic root at 46 mm, mild mitral regurgitation and mild left atrial enlargement. CTA of thoracic aorta repeated November 2018. The aortic root measured 4.7 cm. Transesophageal echocardiogram November 2018 showed normal LV function, aortic valve vegetation on left coronary cusp, severe aortic insufficiency, mildly dilated aortic root, redundant mitral valve chordae and associated vegetation could not be excluded, mild mitral regurgitation. Blood cultures grew coag negative staph. Patient placed on antibiotics with plans for aortic valve/aortic root replacement after completing 6 weeks. Cardiac catheterization 12/20 showed normal coronary arteries and normal right and left heart pressures.   Preoperative carotid Dopplers January 2019 showed no stenosis.  Patient underwent aortic valve replacement (bioprosthetic AVR) on June 05, 2017.  The aortic root was not replaced as it measured 4.1 cm at time of surgery. Patient did have postoperative atrial fibrillation treated with amiodarone.  Since last seen, the patient denies any dyspnea on exertion, orthopnea, PND, pedal edema, palpitations, syncope or chest pain. Mild residual chest soreness.  Current Outpatient Medications  Medication Sig Dispense Refill  . acetaminophen (TYLENOL) 500 MG tablet Take 1 tablet (500 mg total) by mouth every 6 (six) hours as needed (for pain.). 30 tablet 0  . aluminum chloride (HYPERCARE) 20 % external solution Apply 1 application topically 3 (three) times daily as needed (for sweating.).    Marland Kitchen amiodarone (PACERONE) 200 MG tablet Take 1 tablet (200 mg total) by mouth every 12 (twelve) hours. For 5 days then take Amiodarone 200 mg by mouth daily thereafter. 60 tablet 1  . amoxicillin (AMOXIL) 500 MG capsule TAKE 4 TABLETS BY MOUTH 1 HOUR PRIOR  TO DENTAL APPOINTMENT 4 capsule 1  . Ascorbic Acid (VITAMIN C) 1000 MG tablet Take 1,000 mg by mouth daily.    Marland Kitchen aspirin EC 325 MG EC tablet Take 1 tablet (325 mg total) by mouth daily. 30 tablet 0  . atorvastatin (LIPITOR) 10 MG tablet Take 1 tablet (10 mg total) by mouth daily at 6 PM. 30 tablet 6  . betamethasone valerate (VALISONE) 0.1 % cream Apply 1 application topically 2 (two) times daily as needed (for skin irritation.).    Marland Kitchen cetirizine (ZYRTEC) 10 MG tablet Take 10 mg by mouth daily as needed for allergies.    Marland Kitchen ECHINACEA PO Take 760 mg by mouth daily.    . fluorometholone (FML) 0.1 % ophthalmic suspension Place 1 drop into both eyes 3 (three) times daily as needed (for irritated/itchy eyes.).    Marland Kitchen Glucosamine-Chondroitin (COSAMIN DS PO) Take 1 tablet by mouth 2 (two) times daily.    Marland Kitchen loratadine (CLARITIN) 10 MG tablet Take 10 mg by mouth daily as needed for allergies.    . metoprolol tartrate (LOPRESSOR) 25 MG tablet Take 0.5 tablets (12.5 mg total) by mouth 2 (two) times daily. 30 tablet 1  . Naphazoline-Pheniramine (OPCON-A) 0.027-0.315 % SOLN Place 1-2 drops into both eyes 3 (three) times daily as needed (for allergy eyes.).    Marland Kitchen NON FORMULARY Take 2 capsules by mouth 2 (two) times daily. DoTerra (Microplex VMz) 2 Capsule twice daily    . NON FORMULARY Take 2 capsules by mouth 2 (two) times daily. XE0 Mega "Doterra"    . sildenafil (REVATIO) 20 MG tablet Take 20 mg by mouth daily as needed (for ED).     . SUPER B COMPLEX/C PO Take 1 tablet by mouth daily.    Marland Kitchen  traMADol (ULTRAM) 50 MG tablet Take 50 mg by mouth every 4-6 hours PRN moderate pain. 30 tablet 0  . triamcinolone cream (KENALOG) 0.1 % Apply 1 application topically daily as needed (FOR DRY SKIN/ITCHY SKIN.). Apply to area after bath (DO NOT APPLY TO FACE)  1   No current facility-administered medications for this visit.      Past Medical History:  Diagnosis Date  . Allergy   . Anemia   . Barrett's esophagus - short  segment 03/13/2017  . Cancer ([place]) 09/11/13   testicular  . Complication of anesthesia    hard to wake  - 20 years ago  . ED (erectile dysfunction)   . Endocarditis    Archie Endo 03/29/2017  . H/O aneurysm 03/05/2017  . High cholesterol   . History of basal cell carcinoma excision    02/2002   --  NOSE  &   2013  RIGHT EAR  . History of kidney stones   . Hyperlipidemia   . Mild obstructive sleep apnea    PER STUDY 01-18-2005--  NO CPAP OR MOUTH GUARD  . Moderate aortic valve insufficiency   . Multiple pulmonary nodules    PER CT--  PROBABLE  GRANULOMATOUS - patient is not aware of this  . Personal history of colonic polyps    TUBULAR ADENOMA--   . Seasonal allergies   . 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[place])   . Testicular mass    RIGHT    Past Surgical History:  Procedure Laterality Date  . AORTIC VALVE REPLACEMENT N/A 06/05/2017   Procedure: AORTIC VALVE REPLACEMENT (AVR) using 86mm Magna Ease valve;  Surgeon: [name], MD;  Location: [place];  Service: Open Heart Surgery;  Laterality: N/A;  . COLONOSCOPY  01/24/06, 07/10/11  . COLONOSCOPY WITH ESOPHAGOGASTRODUODENOSCOPY (EGD)    . EXTRACORPOREAL SHOCK WAVE LITHOTRIPSY    . HERNIA REPAIR    . MOHS SURGERY  02/2002  &  2013   TIP OF NOSE-///     RIGHT EAR  . ORCHIECTOMY Right 09/11/2013   Procedure:  RIGHT ORCHIECTOMY;  Surgeon: Jorja Loa, MD;  Location: [facility];  Service: Urology;  Laterality: Right;  . RIGHT/LEFT HEART CATH AND CORONARY ANGIOGRAPHY N/A 04/18/2017   Procedure: RIGHT/LEFT HEART CATH AND CORONARY ANGIOGRAPHY;  Surgeon: Sherren Mocha, MD;  Location: [place] [facility];  Service: Cardiovascular;  Laterality: N/A;  . SCROTAL EXPLORATION Right 09/11/2013   Procedure: RIGHT INGUINAL EXPLORATION;  Surgeon: Jorja Loa, MD;  Location: [facility];  Service: Urology;  Laterality: Right;  . TEE WITHOUT CARDIOVERSION N/A 03/29/2017   Procedure: TRANSESOPHAGEAL ECHOCARDIOGRAM  (TEE);  Surgeon: Lelon Perla, MD;  Location: [facility] END[facility];  Service: Cardiovascular;  Laterality: N/A;  . TEE WITHOUT CARDIOVERSION N/A 06/05/2017   Procedure: TRANSESOPHAGEAL ECHOCARDIOGRAM (TEE);  Surgeon: [name], MD;  Location: [place];  Service: Open Heart Surgery;  Laterality: N/A;  . TRANSTHORACIC ECHOCARDIOGRAM  02-25-2013   DR Amiylah Anastos   NORMAL LVSF/  EF 55-60%/   GRADE 2 DIASTOLIC DYSFUNCTION/  ECCENTRIC MODERATE AORTIC INSUFFICIENCY WITHOUT STENOSIS/  MILD DILATED AORTIC ROOT/ TRIVIAL MR, TR,  & PR  . UMBILICAL HERNIA REPAIR  06-08-2009  . URETEROSCOPIC LASER LITHOTRIPSY STONE EXTRACTION  2007    Social History   Socioeconomic History  . Marital status: Married    Spouse name: Coralyn Mark  . Number of children: 2  . Years of education: 86  . Highest education level: Not on file  Occupational History  .  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 Financial resource strain: Not on file  . Food insecurity:    Worry: Not on file    Inability: Not on file  . Transportation needs:    Medical: Not on file    Non-medical: Not on file  Tobacco Use  . Smoking status: Current Some Day Smoker    Types: Pipe  . Smokeless tobacco: Former Systems developer    Types: [place] date: 04/26/2011  . Tobacco comment: OCCASIONAL  PIPE SMOKER  (NEVER SMOKED CIGARETTES)  Substance and Sexual Activity  . Alcohol use: Yes    Comment: 03/29/2017 "might have a glass of wine twice/year"  . Drug use: No  . Sexual activity: Yes  Lifestyle  . Physical activity:    Days per week: Not on file    Minutes per session: Not on file  . Stress: Not on file  Relationships  . Social connections:    Talks on phone: Not on file    Gets together: Not on file    Attends religious service: Not on file    Active member of club or organization: Not on file    Attends meetings of clubs or organizations: Not on file    Relationship status: Not on file  . Intimate partner violence:    Fear of current or  ex partner: Not on file    Emotionally abused: Not on file    Physically abused: Not on file    Forced sexual activity: Not on file  Other Topics Concern  . Not on file  Social History Narrative   Lives with wife Coralyn Mark   Caffeine use: coffee- 1 cup per day    Family History  Problem Relation Age of Onset  . Colon cancer Father   . Heart disease Paternal Grandmother     ROS: no fevers or chills, productive cough, hemoptysis, dysphasia, odynophagia, melena, hematochezia, dysuria, hematuria, rash, seizure activity, orthopnea, PND, pedal edema, claudication. Remaining systems are negative.  Physical Exam: Well-developed well-nourished in no acute distress.  Skin is warm and dry.  HEENT is normal.  Neck is supple.  Chest is clear to auscultation with normal expansion.  Cardiovascular exam is regular rate and rhythm. 1/6 systolic murmur Abdominal exam nontender or distended. No masses palpated. Extremities show no edema. neuro grossly intact   A/P  1 status post aortic valve replacement-we discussed the importance of SBE prophylaxis.  Patient provided a card today.  Will arrange baseline echocardiogram status post AVR.  2 thoracic aortic aneurysm-plan repeat CTA February 2020.  3 postoperative atrial fibrillation-patient remains in sinus rhythm on exam.  Discontinue amiodarone and follow.  4 hypertension-blood pressure is mildly elevated.  Resume Cozaar 50 mg daily and follow blood pressure.  5 hyperlipidemia-continue statin. Lipids and liver monitored by primary care.   Kirk Ruths, MD

## 2017-07-22 ENCOUNTER — Other Ambulatory Visit: Payer: Self-pay | Admitting: *Deleted

## 2017-07-22 ENCOUNTER — Encounter: Payer: Self-pay | Admitting: Cardiology

## 2017-07-22 ENCOUNTER — Ambulatory Visit (INDEPENDENT_AMBULATORY_CARE_PROVIDER_SITE_OTHER): Payer: 59 | Admitting: Cardiology

## 2017-07-22 VITALS — BP 137/99 | HR 77 | Ht 71.0 in | Wt 203.0 lb

## 2017-07-22 DIAGNOSIS — I10 Essential (primary) hypertension: Secondary | ICD-10-CM

## 2017-07-22 DIAGNOSIS — Z952 Presence of prosthetic heart valve: Secondary | ICD-10-CM | POA: Diagnosis not present

## 2017-07-22 DIAGNOSIS — I77810 Thoracic aortic ectasia: Secondary | ICD-10-CM | POA: Diagnosis not present

## 2017-07-22 DIAGNOSIS — E7800 Pure hypercholesterolemia, unspecified: Secondary | ICD-10-CM | POA: Diagnosis not present

## 2017-07-22 MED ORDER — LOSARTAN POTASSIUM 50 MG PO TABS: 50.0000 mg | ORAL_TABLET | Freq: Every day | ORAL | 3 refills | Status: DC

## 2017-07-22 NOTE — Patient Instructions (Signed)
Medication Instructions:   STOP AMIODARONE  START LOSARTAN 50 MG ONCE DAILY   Testing/Procedures:  Your physician has requested that you have an echocardiogram. Echocardiography is a painless test that uses sound waves to create images of your heart. It provides your doctor with information about the size and shape of your heart and how well your heart's chambers and valves are working. This procedure takes approximately one hour. There are no restrictions for this procedure.    Follow-Up:  Your physician wants you to follow-up in: [place] will receive a reminder letter in the mail two months in advance. If you don't receive a letter, please call our office to schedule the follow-up appointment.   If you need a refill on your cardiac medications before your next appointment, please call your pharmacy.

## 2017-07-28 ENCOUNTER — Encounter: Payer: Self-pay | Admitting: Cardiology

## 2017-07-29 ENCOUNTER — Other Ambulatory Visit: Payer: Self-pay | Admitting: *Deleted

## 2017-07-29 MED ORDER — METOPROLOL TARTRATE 25 MG PO TABS: 12.5000 mg | ORAL_TABLET | Freq: Two times a day (BID) | ORAL | 3 refills | Status: DC

## 2017-07-30 ENCOUNTER — Other Ambulatory Visit: Payer: Self-pay

## 2017-07-30 ENCOUNTER — Ambulatory Visit (HOSPITAL_COMMUNITY): Payer: 59 | Attending: Cardiovascular Disease

## 2017-07-30 DIAGNOSIS — I330 Acute and subacute infective endocarditis: Secondary | ICD-10-CM | POA: Diagnosis not present

## 2017-07-30 DIAGNOSIS — Z952 Presence of prosthetic heart valve: Secondary | ICD-10-CM

## 2017-07-30 DIAGNOSIS — Z953 Presence of xenogenic heart valve: Secondary | ICD-10-CM | POA: Insufficient documentation

## 2017-07-30 DIAGNOSIS — Z87891 Personal history of nicotine dependence: Secondary | ICD-10-CM | POA: Diagnosis not present

## 2017-07-30 DIAGNOSIS — I081 Rheumatic disorders of both mitral and tricuspid valves: Secondary | ICD-10-CM | POA: Insufficient documentation

## 2017-07-30 DIAGNOSIS — I77810 Thoracic aortic ectasia: Secondary | ICD-10-CM | POA: Diagnosis not present

## 2017-07-30 DIAGNOSIS — M2550 Pain in unspecified joint: Secondary | ICD-10-CM | POA: Diagnosis not present

## 2017-07-30 DIAGNOSIS — E785 Hyperlipidemia, unspecified: Secondary | ICD-10-CM | POA: Diagnosis not present

## 2017-07-31 ENCOUNTER — Other Ambulatory Visit: Payer: Self-pay | Admitting: *Deleted

## 2017-07-31 MED ORDER — METOPROLOL TARTRATE 25 MG PO TABS: 12.5000 mg | ORAL_TABLET | Freq: Two times a day (BID) | ORAL | 3 refills | Status: DC

## 2017-08-01 DIAGNOSIS — E7800 Pure hypercholesterolemia, unspecified: Secondary | ICD-10-CM | POA: Diagnosis not present

## 2017-08-01 DIAGNOSIS — I10 Essential (primary) hypertension: Secondary | ICD-10-CM | POA: Diagnosis not present

## 2017-08-01 DIAGNOSIS — D649 Anemia, unspecified: Secondary | ICD-10-CM | POA: Diagnosis not present

## 2017-08-04 ENCOUNTER — Other Ambulatory Visit: Payer: Self-pay | Admitting: Physician Assistant

## 2017-08-05 ENCOUNTER — Other Ambulatory Visit: Payer: Self-pay | Admitting: Physician Assistant

## 2017-08-06 ENCOUNTER — Other Ambulatory Visit: Payer: Self-pay | Admitting: *Deleted

## 2017-08-06 MED ORDER — METOPROLOL TARTRATE 25 MG PO TABS: 12.5000 mg | ORAL_TABLET | Freq: Two times a day (BID) | ORAL | 3 refills | Status: DC

## 2017-08-15 ENCOUNTER — Telehealth (HOSPITAL_COMMUNITY): Payer: Self-pay | Admitting: Pharmacist

## 2017-08-16 NOTE — Telephone Encounter (Signed)
Cardiac Rehab Medication Review by a Pharmacist  Does the patient feel that his/her medications are working for him/her?  yes  Has the patient been experiencing any side effects to the medications prescribed?  no  Does the patient measure his/her own blood pressure or blood glucose at home?  yes - last reading 118/82  Does the patient have any problems obtaining medications due to transportation or finances?   no  Understanding of regimen: good Understanding of indications: good Potential of compliance: good  Pharmacist comments: Patient presenting for cardiac rehab orientation. He keeps a list of his medications and was easily able to confirm or deny each medication and note changes. He has not been experiencing any medication-related issues. He notes that he can only come to rehab on Mondays and Wednesdays, not Fridays.  Mila Merry Gerarda Fraction, PharmD PGY1 Pharmacy Resident Pager: [phone] 08/16/2017 3:26 PM

## 2017-08-20 ENCOUNTER — Encounter (HOSPITAL_COMMUNITY)
Admission: RE | Admit: 2017-08-20 | Discharge: 2017-08-20 | Disposition: A | Discharge status: Home / Self Care | Payer: 59 | Source: Ambulatory Visit | Attending: Cardiology | Admitting: Cardiology

## 2017-08-20 ENCOUNTER — Encounter (HOSPITAL_COMMUNITY): Payer: Self-pay

## 2017-08-20 VITALS — BP 104/70 | HR 71 | Ht 71.0 in | Wt 211.0 lb

## 2017-08-20 DIAGNOSIS — G4733 Obstructive sleep apnea (adult) (pediatric): Secondary | ICD-10-CM | POA: Insufficient documentation

## 2017-08-20 DIAGNOSIS — F17200 Nicotine dependence, unspecified, uncomplicated: Secondary | ICD-10-CM | POA: Diagnosis not present

## 2017-08-20 DIAGNOSIS — Z79899 Other long term (current) drug therapy: Secondary | ICD-10-CM | POA: Diagnosis not present

## 2017-08-20 DIAGNOSIS — Z952 Presence of prosthetic heart valve: Secondary | ICD-10-CM | POA: Diagnosis not present

## 2017-08-20 DIAGNOSIS — E785 Hyperlipidemia, unspecified: Secondary | ICD-10-CM | POA: Diagnosis not present

## 2017-08-20 DIAGNOSIS — E7800 Pure hypercholesterolemia, unspecified: Secondary | ICD-10-CM | POA: Insufficient documentation

## 2017-08-20 DIAGNOSIS — Z7982 Long term (current) use of aspirin: Secondary | ICD-10-CM | POA: Diagnosis not present

## 2017-08-20 NOTE — Progress Notes (Signed)
Cardiac Individual Treatment Plan  Patient Details  Name: [name] Anderson MRN: [id number] Date of Birth: June 09, 1955 Referring Provider:   Flowsheet Row CARDIAC REHAB PHASE II ORIENTATION from 08/20/2017 in [facility]  Referring Provider  Kirk Ruths, MD.      Initial Encounter Date:  Flowsheet Row CARDIAC REHAB PHASE II ORIENTATION from 08/20/2017 in [facility]  Date  08/20/17  Referring Provider  Kirk Ruths, MD.      Visit Diagnosis: S/P AVR  Patient's Home Medications on Admission:  Current Outpatient Medications:  .  acetaminophen (TYLENOL) 500 MG tablet, Take 1 tablet (500 mg total) by mouth every 6 (six) hours as needed (for pain.)., Disp: 30 tablet, Rfl: 0 .  aluminum chloride (HYPERCARE) 20 % external solution, Apply 1 application topically 3 (three) times daily as needed (for sweating.)., Disp: , Rfl:  .  Ascorbic Acid (VITAMIN C) 1000 MG tablet, Take 1,000 mg by mouth daily., Disp: , Rfl:  .  aspirin EC 81 MG tablet, Take 81 mg by mouth daily., Disp: , Rfl:  .  atorvastatin (LIPITOR) 10 MG tablet, Take 1 tablet (10 mg total) by mouth daily at 6 PM., Disp: 30 tablet, Rfl: 6 .  betamethasone valerate (VALISONE) 0.1 % cream, Apply 1 application topically 2 (two) times daily as needed (for skin irritation.)., Disp: , Rfl:  .  cetirizine (ZYRTEC) 10 MG tablet, Take 10 mg by mouth daily as needed for allergies., Disp: , Rfl:  .  ECHINACEA PO, Take 760 mg by mouth daily., Disp: , Rfl:  .  fluorometholone (FML) 0.1 % ophthalmic suspension, Place 1 drop into both eyes 3 (three) times daily as needed (for irritated/itchy eyes.)., Disp: , Rfl:  .  Glucosamine-Chondroitin (COSAMIN DS PO), Take 1 tablet by mouth 2 (two) times daily., Disp: , Rfl:  .  loratadine (CLARITIN) 10 MG tablet, Take 10 mg by mouth daily as needed for allergies., Disp: , Rfl:  .  losartan (COZAAR) 50 MG tablet, Take 1 tablet (50 mg total) by mouth  daily., Disp: 90 tablet, Rfl: 3 .  metoprolol tartrate (LOPRESSOR) 25 MG tablet, Take 0.5 tablets (12.5 mg total) by mouth 2 (two) times daily., Disp: 90 tablet, Rfl: 3 .  Naphazoline-Pheniramine (OPCON-A) 0.027-0.315 % SOLN, Place 1-2 drops into both eyes 3 (three) times daily as needed (for allergy eyes.)., Disp: , Rfl:  .  NON FORMULARY, Take 2 capsules by mouth 2 (two) times daily. DoTerra (Microplex VMz) 2 Capsule twice daily, Disp: , Rfl:  .  NON FORMULARY, Take 2 capsules by mouth 2 (two) times daily. XE0 Mega "Doterra", Disp: , Rfl:  .  sildenafil (REVATIO) 20 MG tablet, Take 20 mg by mouth daily as needed (for ED). , Disp: , Rfl:  .  SUPER B COMPLEX/C PO, Take 1 tablet by mouth daily., Disp: , Rfl:  .  triamcinolone cream (KENALOG) 0.1 %, Apply 1 application topically daily as needed (FOR DRY SKIN/ITCHY SKIN.). Apply to area after bath (DO NOT APPLY TO FACE), Disp: , Rfl: 1 .  traMADol (ULTRAM) 50 MG tablet, Take 50 mg by mouth every 4-6 hours PRN moderate pain. (Patient not taking: Reported on 08/16/2017), Disp: 30 tablet, Rfl: 0  Past Medical History: Past Medical History:  Diagnosis Date  . Allergy   . Anemia   . Barrett's esophagus - short segment 03/13/2017  . Cancer ([place]) 09/11/13   testicular  . Complication of anesthesia    hard  to wake  - 20 years ago  . ED (erectile dysfunction)   . Endocarditis    [facility] 03/29/2017  . H/O aneurysm 03/05/2017  . High cholesterol   . History of basal cell carcinoma excision    02/2002   --  NOSE  &   2013  RIGHT EAR  . History of kidney stones   . Hyperlipidemia   . Mild obstructive sleep apnea    PER STUDY 01-18-2005--  NO CPAP OR MOUTH GUARD  . Moderate aortic valve insufficiency   . Multiple pulmonary nodules    PER CT--  PROBABLE  GRANULOMATOUS - patient is not aware of this  . Personal history of colonic polyps    TUBULAR ADENOMA--   . Seasonal allergies   . 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)   . Testicular mass    RIGHT    Tobacco  Use: Social History   Tobacco Use  Smoking Status Current Some Day Smoker  . Types: Pipe  Smokeless Tobacco Former Systems developer  . Types: Chew  . Quit date: 04/26/2011  Tobacco Comment   OCCASIONAL  PIPE SMOKER  (NEVER SMOKED CIGARETTES)    Labs: Recent Review Flowsheet Data    Labs for ITP Cardiac and Pulmonary Rehab Latest Ref Rng & Units 06/05/2017 06/05/2017 06/05/2017 06/05/2017 06/06/2017   Cholestrol 0 - 200 mg/dL - - - - -   LDLCALC 0 - 99 mg/dL - - - - -   LDLDIRECT mg/dL - - - - -   HDL >40 mg/dL - - - - -   Trlycerides <150 mg/dL - - - - -   Hemoglobin A1c 4.8 - 5.6 % - - - - -   PHART 7.350 - 7.450 7.312(L) 7.300(L) 7.342(L) - -   PCO2ART 32.0 - 48.0 mmHg 43.3 39.3 37.9 - -   HCO3 20.0 - 28.0 mmol/L 21.9 19.3(L) 20.6 - -   TCO2 22 - 32 mmol/L 23 21(L) 22 21(L) 24   ACIDBASEDEF 0.0 - 2.0 mmol/L 4.0(H) 7.0(H) 5.0(H) - -   O2SAT % 98.0 99.0 97.0 - -      Capillary Blood Glucose: Lab Results  Component Value Date   GLUCAP 102 (H) 06/09/2017   GLUCAP 107 (H) 06/08/2017   GLUCAP 111 (H) 06/08/2017   GLUCAP 111 (H) 06/07/2017   GLUCAP 130 (H) 06/07/2017     Exercise Target Goals: Date: 08/20/17  Exercise Program Goal: Individual exercise prescription set using results from initial 6 min walk test and THRR while considering  patient's activity barriers and safety.   Exercise Prescription Goal: Initial exercise prescription builds to 30-45 minutes a day of aerobic activity, 2-3 days per week.  Home exercise guidelines will be given to patient during program as part of exercise prescription that the participant will acknowledge.  Activity Barriers & Risk Stratification: Activity Barriers & Cardiac Risk Stratification - 08/20/17 0851    Activity Barriers & Cardiac Risk Stratification          Activity Barriers  Other (comment)    Comments  Bulging disc in back. Right hip pain with walking.    Cardiac Risk Stratification  Moderate           6 Minute Walk: 6 Minute Walk     6 Minute Walk    Row Name 08/20/17 0853   Phase  Initial   Distance  1854 feet   Walk Time  6 minutes   # of Rest Breaks  0   MPH  3.51  METS  4.26   RPE  9   VO2 Peak  14.92   Symptoms  No   Resting HR  71 bpm   Resting BP  104/70   Resting Oxygen Saturation   99 %   Exercise Oxygen Saturation  during 6 min walk  95 %   Max Ex. HR  93 bpm   Max Ex. BP  136/88   2 Minute Post BP  106/84          Oxygen Initial Assessment:   Oxygen Re-Evaluation:   Oxygen Discharge (Final Oxygen Re-Evaluation):   Initial Exercise Prescription: Initial Exercise Prescription - 08/20/17 0900    Date of Initial Exercise RX and Referring Provider          Date  08/20/17    Referring Provider  Kirk Ruths, MD.        Treadmill          MPH  2.8    Grade  0    Minutes  10    METs  3.14        Bike          Level  1.5    Minutes  3.98    METs  10        NuStep          Level  4    SPM  85    Minutes  10    METs  3        Prescription Details          Frequency (times per week)  3    Duration  Progress to 45 minutes of aerobic exercise without signs/symptoms of physical distress        Intensity          THRR 40-80% of Max Heartrate  64-127    Ratings of Perceived Exertion  11-13    Perceived Dyspnea  0-4        Progression          Progression  Continue to progress workloads to maintain intensity without signs/symptoms of physical distress.        Resistance Training          Training Prescription  Yes    Weight  4lbs    Reps  10-15           Perform Capillary Blood Glucose checks as needed.  Exercise Prescription Changes:   Exercise Comments:   Exercise Goals and Review: Exercise Goals    Exercise Goals    Row Name 08/20/17 0856   Increase Physical Activity  Yes   Intervention  Provide advice, education, support and counseling about physical activity/exercise needs.;Develop an individualized exercise prescription for aerobic and  resistive training based on initial evaluation findings, risk stratification, comorbidities and participant's personal goals.   Expected Outcomes  Short Term: Attend rehab on a regular basis to increase amount of physical activity.;Long Term: Exercising regularly at least 3-5 days a week.;Long Term: Add in home exercise to make exercise part of routine and to increase amount of physical activity.   Increase Strength and Stamina  Yes   Intervention  Provide advice, education, support and counseling about physical activity/exercise needs.;Develop an individualized exercise prescription for aerobic and resistive training based on initial evaluation findings, risk stratification, comorbidities and participant's personal goals.   Expected Outcomes  Short Term: Increase workloads from initial exercise prescription for resistance, speed, and METs.;Short Term: Perform resistance training exercises routinely during rehab  and add in resistance training at home;Long Term: Improve cardiorespiratory fitness, muscular endurance and strength as measured by increased METs and functional capacity (6MWT)   Able to understand and use rate of perceived exertion (RPE) scale  Yes   Intervention  Provide education and explanation on how to use RPE scale   Expected Outcomes  Short Term: Able to use RPE daily in rehab to express subjective intensity level;Long Term:  Able to use RPE to guide intensity level when exercising independently   Knowledge and understanding of Target Heart Rate Range (THRR)  Yes   Intervention  Provide education and explanation of THRR including how the numbers were predicted and where they are located for reference   Expected Outcomes  Short Term: Able to state/look up THRR;Long Term: Able to use THRR to govern intensity when exercising independently;Short Term: Able to use daily as guideline for intensity in rehab   Able to check pulse independently  Yes   Intervention  Provide education and  demonstration on how to check pulse in carotid and radial arteries.;Review the importance of being able to check your own pulse for safety during independent exercise   Expected Outcomes  Short Term: Able to explain why pulse checking is important during independent exercise;Long Term: Able to check pulse independently and accurately   Understanding of Exercise Prescription  Yes   Intervention  Provide education, explanation, and written materials on patient's individual exercise prescription   Expected Outcomes  Short Term: Able to explain program exercise prescription;Long Term: Able to explain home exercise prescription to exercise independently          Exercise Goals Re-Evaluation :    Discharge Exercise Prescription (Final Exercise Prescription Changes):   Nutrition:  Target Goals: Understanding of nutrition guidelines, daily intake of sodium 1500mg , cholesterol 200mg , calories 30% from fat and 7% or less from saturated fats, daily to have 5 or more servings of fruits and vegetables.  Biometrics: Pre Biometrics - 08/20/17 0856    Pre Biometrics          Height  5\' 11"  (1.803 m)    Weight  210 lb 15.7 oz (95.7 kg)    Waist Circumference  40.5 inches    Hip Circumference  41.5 inches    Waist to Hip Ratio  0.98 %    BMI (Calculated)  29.44    Triceps Skinfold  25 mm    % Body Fat  30 %    Grip Strength  51 kg    Flexibility  8 in Knees slightly bent    Single Leg Stand  11.53 seconds            Nutrition Therapy Plan and Nutrition Goals:   Nutrition Assessments:   Nutrition Goals Re-Evaluation:   Nutrition Goals Re-Evaluation:   Nutrition Goals Discharge (Final Nutrition Goals Re-Evaluation):   Psychosocial: Target Goals: Acknowledge presence or absence of significant depression and/or stress, maximize coping skills, provide positive support system. Participant is able to verbalize types and ability to use techniques and skills needed for reducing stress  and depression.  Initial Review & Psychosocial Screening: Initial Psych Review & Screening - 08/20/17 0810    Initial Review          Current issues with  None Identified        Family Dynamics          Good Support System?  Yes wife/children/friends         Barriers  Psychosocial barriers to participate in program  There are no identifiable barriers or psychosocial needs.        Screening Interventions          Interventions  Encouraged to exercise           Quality of Life Scores: Quality of Life - 08/20/17 0906    Quality of Life Scores          Health/Function Pre  27.82 %    Socioeconomic Pre  27 %    Psych/Spiritual Pre  28.57 %    Family Pre  30 %    GLOBAL Pre  28.14 %          Scores of 19 and below usually indicate a poorer quality of life in these areas.  A difference of  2-3 points is a clinically meaningful difference.  A difference of 2-3 points in the total score of the Quality of Life Index has been associated with significant improvement in overall quality of life, self-image, physical symptoms, and general health in studies assessing change in quality of life.  PHQ-9: Recent Review Flowsheet Data    Depression screen [facility] 2/9 02/11/2013   Decreased Interest 0   Down, Depressed, Hopeless 0   PHQ - 2 Score 0     Interpretation of Total Score  Total Score Depression Severity:  1-4 = Minimal depression, 5-9 = Mild depression, 10-14 = Moderate depression, 15-19 = Moderately severe depression, 20-27 = Severe depression   Psychosocial Evaluation and Intervention:   Psychosocial Re-Evaluation:   Psychosocial Discharge (Final Psychosocial Re-Evaluation):   Vocational Rehabilitation: Provide vocational rehab assistance to qualifying candidates.   Vocational Rehab Evaluation & Intervention: Vocational Rehab - 08/20/17 0809    Initial Vocational Rehab Evaluation & Intervention          Assessment shows need for Vocational  Rehabilitation  No           Education: Education Goals: Education classes will be provided on a weekly basis, covering required topics. Participant will state understanding/return demonstration of topics presented.  Learning Barriers/Preferences: Learning Barriers/Preferences - 08/20/17 0849    Learning Barriers/Preferences          Learning Barriers  None    Learning Preferences  None           Education Topics: Count Your Pulse:  -Group instruction provided by verbal instruction, demonstration, patient participation and written materials to support subject.  Instructors address importance of being able to find your pulse and how to count your pulse when at home without a heart monitor.  Patients get hands on experience counting their pulse with staff help and individually.   Heart Attack, Angina, and Risk Factor Modification:  -Group instruction provided by verbal instruction, video, and written materials to support subject.  Instructors address signs and symptoms of angina and heart attacks.    Also discuss risk factors for heart disease and how to make changes to improve heart health risk factors.   Functional Fitness:  -Group instruction provided by verbal instruction, demonstration, patient participation, and written materials to support subject.  Instructors address safety measures for doing things around the house.  Discuss how to get up and down off the floor, how to pick things up properly, how to safely get out of a chair without assistance, and balance training.   Meditation and Mindfulness:  -Group instruction provided by verbal instruction, patient participation, and written materials to support subject.  Instructor addresses importance of mindfulness and  meditation practice to help reduce stress and improve awareness.  Instructor also leads participants through a meditation exercise.    Stretching for Flexibility and Mobility:  -Group instruction provided by  verbal instruction, patient participation, and written materials to support subject.  Instructors lead participants through series of stretches that are designed to increase flexibility thus improving mobility.  These stretches are additional exercise for major muscle groups that are typically performed during regular warm up and cool down.   Hands Only CPR:  -Group verbal, video, and participation provides a basic overview of AHA guidelines for community CPR. Role-play of emergencies allow participants the opportunity to practice calling for help and chest compression technique with discussion of AED use.   Hypertension: -Group verbal and written instruction that provides a basic overview of hypertension including the most recent diagnostic guidelines, risk factor reduction with self-care instructions and medication management.    Nutrition I class: Heart Healthy Eating:  -Group instruction provided by PowerPoint slides, verbal discussion, and written materials to support subject matter. The instructor gives an explanation and review of the Therapeutic Lifestyle Changes diet recommendations, which includes a discussion on lipid goals, dietary fat, sodium, fiber, plant stanol/sterol esters, sugar, and the components of a well-balanced, healthy diet.   Nutrition II class: Lifestyle Skills:  -Group instruction provided by PowerPoint slides, verbal discussion, and written materials to support subject matter. The instructor gives an explanation and review of label reading, grocery shopping for heart health, heart healthy recipe modifications, and ways to make healthier choices when eating out.   Diabetes Question & Answer:  -Group instruction provided by PowerPoint slides, verbal discussion, and written materials to support subject matter. The instructor gives an explanation and review of diabetes co-morbidities, pre- and post-prandial blood glucose goals, pre-exercise blood glucose goals, signs,  symptoms, and treatment of hypoglycemia and hyperglycemia, and foot care basics.   Diabetes Blitz:  -Group instruction provided by PowerPoint slides, verbal discussion, and written materials to support subject matter. The instructor gives an explanation and review of the physiology behind type 1 and type 2 diabetes, diabetes medications and rational behind using different medications, pre- and post-prandial blood glucose recommendations and Hemoglobin A1c goals, diabetes diet, and exercise including blood glucose guidelines for exercising safely.    Portion Distortion:  -Group instruction provided by PowerPoint slides, verbal discussion, written materials, and food models to support subject matter. The instructor gives an explanation of serving size versus portion size, changes in portions sizes over the last 20 years, and what consists of a serving from each food group.   Stress Management:  -Group instruction provided by verbal instruction, video, and written materials to support subject matter.  Instructors review role of stress in heart disease and how to cope with stress positively.     Exercising on Your Own:  -Group instruction provided by verbal instruction, power point, and written materials to support subject.  Instructors discuss benefits of exercise, components of exercise, frequency and intensity of exercise, and end points for exercise.  Also discuss use of nitroglycerin and activating EMS.  Review options of places to exercise outside of rehab.  Review guidelines for sex with heart disease.   Cardiac Drugs I:  -Group instruction provided by verbal instruction and written materials to support subject.  Instructor reviews cardiac drug classes: antiplatelets, anticoagulants, beta blockers, and statins.  Instructor discusses reasons, side effects, and lifestyle considerations for each drug class.   Cardiac Drugs II:  -Group instruction provided by verbal instruction and written  materials to support subject.  Instructor reviews cardiac drug classes: angiotensin converting enzyme inhibitors (ACE-I), angiotensin II receptor blockers (ARBs), nitrates, and calcium channel blockers.  Instructor discusses reasons, side effects, and lifestyle considerations for each drug class.   Anatomy and Physiology of the Circulatory System:  Group verbal and written instruction and models provide basic cardiac anatomy and physiology, with the coronary electrical and arterial systems. Review of: AMI, Angina, Valve disease, Heart Failure, Peripheral Artery Disease, Cardiac Arrhythmia, Pacemakers, and the ICD.   Other Education:  -Group or individual verbal, written, or video instructions that support the educational goals of the cardiac rehab program.   Holiday Eating Survival Tips:  -Group instruction provided by PowerPoint slides, verbal discussion, and written materials to support subject matter. The instructor gives patients tips, tricks, and techniques to help them not only survive but enjoy the holidays despite the onslaught of food that accompanies the holidays.   Knowledge Questionnaire Score: Knowledge Questionnaire Score - 08/20/17 7893    Knowledge Questionnaire Score          Pre Score  23/24           Core Components/Risk Factors/Patient Goals at Admission: Personal Goals and Risk Factors at Admission - 08/20/17 0903    Core Components/Risk Factors/Patient Goals on Admission           Weight Management  Weight Loss;Yes    Intervention  Weight Management: Develop a combined nutrition and exercise program designed to reach desired caloric intake, while maintaining appropriate intake of nutrient and fiber, sodium and fats, and appropriate energy expenditure required for the weight goal.;Weight Management: Provide education and appropriate resources to help participant work on and attain dietary goals.    Admit Weight  210 lb 15.7 oz (95.7 kg)    Goal Weight: Short Term   204 lb (92.5 kg)    Goal Weight: Long Term  185 lb (83.9 kg)    Expected Outcomes  Short Term: Continue to assess and modify interventions until short term weight is achieved;Long Term: Adherence to nutrition and physical activity/exercise program aimed toward attainment of established weight goal;Weight Loss: Understanding of general recommendations for a balanced deficit meal plan, which promotes 1-2 lb weight loss per week and includes a negative energy balance of [phone] kcal/d;Understanding recommendations for meals to include 15-35% energy as protein, 25-35% energy from fat, 35-60% energy from carbohydrates, less than 200mg  of dietary cholesterol, 20-35 gm of total fiber daily;Understanding of distribution of calorie intake throughout the day with the consumption of 4-5 meals/snacks    Lipids  Yes    Intervention  Provide education and support for participant on nutrition & aerobic/resistive exercise along with prescribed medications to achieve LDL 70mg , HDL >40mg .    Expected Outcomes  Short Term: Participant states understanding of desired cholesterol values and is compliant with medications prescribed. Participant is following exercise prescription and nutrition guidelines.;Long Term: Cholesterol controlled with medications as prescribed, with individualized exercise RX and with personalized nutrition plan. Value goals: LDL < 70mg , HDL > 40 mg.           Core Components/Risk Factors/Patient Goals Review:    Core Components/Risk Factors/Patient Goals at Discharge (Final Review):    ITP Comments: ITP Comments    Row Name 08/20/17 0808   ITP Comments  Dr. Fransico Him, Medical Director       Comments: Patient attended orientation from [phone] to  to review rules and guidelines for program. Completed 6 minute walk test, Intitial ITP, and  exercise prescription.  VSS. Telemetry-sinus rhythm, Asymptomatic.  Andi Hence, RN, BSN Cardiac Pulmonary Rehab 08/20/17 10:36 AM

## 2017-08-26 ENCOUNTER — Encounter (HOSPITAL_COMMUNITY)
Admission: RE | Admit: 2017-08-26 | Discharge: 2017-08-26 | Disposition: A | Discharge status: Home / Self Care | Payer: 59 | Source: Ambulatory Visit | Attending: Cardiology | Admitting: Cardiology

## 2017-08-26 ENCOUNTER — Encounter (HOSPITAL_COMMUNITY): Payer: Self-pay

## 2017-08-26 DIAGNOSIS — Z952 Presence of prosthetic heart valve: Secondary | ICD-10-CM

## 2017-08-26 NOTE — Progress Notes (Signed)
Daily Session Note  Patient Details  Name: [name] Anderson MRN: [id number] Date of Birth: 01/15/1956 Referring Provider:   Flowsheet Row CARDIAC REHAB PHASE II ORIENTATION from 08/20/2017 in [facility]  Referring Provider  Kirk Ruths, MD.      Encounter Date: 08/26/2017  Check In: Session Check In - 08/26/17 0746    Check-In          Location  [facility]    Staff Present  Su Hilt, MS, ACSM RCEP, Exercise Physiologist;Joann Rion, RN, Mosie Epstein, MS,ACSM CEP, Exercise Physiologist    Supervising physician immediately available to respond to emergencies  [facility] immediately available    Physician(s)  Dr. Tyrell Antonio    Medication changes reported      No    Fall or balance concerns reported     No    Tobacco Cessation  No Change    Warm-up and Cool-down  Performed as group-led instruction    Resistance Training Performed  Yes    VAD Patient?  No        Pain Assessment          Currently in Pain?  No/denies    Multiple Pain Sites  No           Capillary Blood Glucose: No results found for this or any previous visit (from the past 24 hour(s)).    Social History   Tobacco Use  Smoking Status Current Some Day Smoker  . Types: Pipe  Smokeless Tobacco Former Systems developer  . Types: Chew  . Quit date: 04/26/2011  Tobacco Comment   OCCASIONAL  PIPE SMOKER  (NEVER SMOKED CIGARETTES)    Goals Met:  Exercise tolerated well  Goals Unmet:  Not Applicable  Comments: Pt started cardiac rehab today.  Pt tolerated light exercise without difficulty. VSS, telemetry-sinus rhythm,  asymptomatic.  Medication list reconciled. Pt denies barriers to medicaiton compliance.  PSYCHOSOCIAL ASSESSMENT:  PHQ-0  Pt exhibits positive coping skills, hopeful outlook with supportive family. No psychosocial needs identified at this time, no psychosocial interventions necessary.    Pt oriented to exercise equipment and routine.     Understanding verbalized.   Dr. Fransico Him is Medical Director for Cardiac Rehab at [facility].

## 2017-08-26 NOTE — Progress Notes (Signed)
[name] Anderson 62 y.o. male DOB 09-28-1955 MRN [id number]       Nutrition  1. S/P AVR    Past Medical History:  Diagnosis Date  . Allergy   . Anemia   . Barrett's esophagus - short segment 03/13/2017  . Cancer ([place]) 09/11/13   testicular  . Complication of anesthesia    hard to wake  - 20 years ago  . ED (erectile dysfunction)   . Endocarditis    [facility] 03/29/2017  . H/O aneurysm 03/05/2017  . High cholesterol   . History of basal cell carcinoma excision    02/2002   --  NOSE  &   2013  RIGHT EAR  . History of kidney stones   . Hyperlipidemia   . Mild obstructive sleep apnea    PER STUDY 01-18-2005--  NO CPAP OR MOUTH GUARD  . Moderate aortic valve insufficiency   . Multiple pulmonary nodules    PER CT--  PROBABLE  GRANULOMATOUS - patient is not aware of this  . Personal history of colonic polyps    TUBULAR ADENOMA--   . Seasonal allergies   . 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[place])   . Testicular mass    RIGHT   Meds reviewed.   HT: Ht Readings from Last 1 Encounters:  08/20/17 5\' 11"  (1.803 m)    WT: Wt Readings from Last 3 Encounters:  08/20/17 210 lb 15.7 oz (95.7 kg)  07/22/17 203 lb (92.1 kg)  07/08/17 202 lb (91.6 kg)     Body mass index is 29.43 kg/m.  Current tobacco use? Yes   Labs:  Lipid Panel     Component Value Date/Time   CHOL 165 03/30/2017 0353   TRIG 91 03/30/2017 0353   HDL 24 (L) 03/30/2017 0353   CHOLHDL 6.9 03/30/2017 0353   VLDL 18 03/30/2017 0353   LDLCALC 123 (H) 03/30/2017 0353   LDLDIRECT 160.9 02/12/2013 0734    Lab Results  Component Value Date   HGBA1C 5.2 05/30/2017   CBG (last 3)  No results for input(s): GLUCAP in the last 72 hours.  Nutrition Diagnosis ? Food-and nutrition-related knowledge deficit related to lack of exposure to information as related to diagnosis of heart procedure ? Overweight related to excessive energy intake as evidenced by a Body mass index is 29.43 kg/m.  Nutrition Goal(s):  ? Pt to identify and  limit food sources of saturated fat, trans fat, and sodium ? Pt to identify food quantities necessary to achieve weight loss of 6-24 lb (2.7-10.9 kg) at graduation from cardiac rehab. Long-term wt loss goal wt of 185 lb desired.   Plan:  Pt to attend nutrition classes ? Nutrition I ? Nutrition II ? Portion Distortion  Will provide client-centered nutrition education as part of interdisciplinary care.   Monitor and evaluate progress toward nutrition goal with team.  Derek Mound, M.Ed, RD, LDN, CDE 08/26/2017 9:17 AM

## 2017-08-28 ENCOUNTER — Encounter (HOSPITAL_COMMUNITY)
Admission: RE | Admit: 2017-08-28 | Discharge: 2017-08-28 | Disposition: A | Discharge status: Home / Self Care | Payer: 59 | Source: Ambulatory Visit | Attending: Cardiology | Admitting: Cardiology

## 2017-08-28 DIAGNOSIS — Z7982 Long term (current) use of aspirin: Secondary | ICD-10-CM | POA: Insufficient documentation

## 2017-08-28 DIAGNOSIS — E785 Hyperlipidemia, unspecified: Secondary | ICD-10-CM | POA: Insufficient documentation

## 2017-08-28 DIAGNOSIS — G4733 Obstructive sleep apnea (adult) (pediatric): Secondary | ICD-10-CM | POA: Diagnosis not present

## 2017-08-28 DIAGNOSIS — Z79899 Other long term (current) drug therapy: Secondary | ICD-10-CM | POA: Insufficient documentation

## 2017-08-28 DIAGNOSIS — F17200 Nicotine dependence, unspecified, uncomplicated: Secondary | ICD-10-CM | POA: Insufficient documentation

## 2017-08-28 DIAGNOSIS — E7800 Pure hypercholesterolemia, unspecified: Secondary | ICD-10-CM | POA: Diagnosis not present

## 2017-08-28 DIAGNOSIS — Z952 Presence of prosthetic heart valve: Secondary | ICD-10-CM | POA: Insufficient documentation

## 2017-08-30 ENCOUNTER — Encounter (HOSPITAL_COMMUNITY): Payer: 59

## 2017-09-02 ENCOUNTER — Encounter (HOSPITAL_COMMUNITY)
Admission: RE | Admit: 2017-09-02 | Discharge: 2017-09-02 | Disposition: A | Discharge status: Home / Self Care | Payer: 59 | Source: Ambulatory Visit | Attending: Cardiology | Admitting: Cardiology

## 2017-09-02 DIAGNOSIS — Z952 Presence of prosthetic heart valve: Secondary | ICD-10-CM

## 2017-09-04 ENCOUNTER — Encounter (HOSPITAL_COMMUNITY)
Admission: RE | Admit: 2017-09-04 | Discharge: 2017-09-04 | Disposition: A | Discharge status: Home / Self Care | Payer: 59 | Source: Ambulatory Visit | Attending: Cardiology | Admitting: Cardiology

## 2017-09-04 DIAGNOSIS — Z952 Presence of prosthetic heart valve: Secondary | ICD-10-CM

## 2017-09-06 ENCOUNTER — Encounter (HOSPITAL_COMMUNITY): Payer: 59

## 2017-09-09 ENCOUNTER — Encounter (HOSPITAL_COMMUNITY)
Admission: RE | Admit: 2017-09-09 | Discharge: 2017-09-09 | Disposition: A | Discharge status: Home / Self Care | Payer: 59 | Source: Ambulatory Visit | Attending: Cardiology | Admitting: Cardiology

## 2017-09-09 DIAGNOSIS — Z952 Presence of prosthetic heart valve: Secondary | ICD-10-CM | POA: Diagnosis not present

## 2017-09-10 ENCOUNTER — Encounter: Payer: Self-pay | Admitting: Cardiology

## 2017-09-11 ENCOUNTER — Encounter (HOSPITAL_COMMUNITY)
Admission: RE | Admit: 2017-09-11 | Discharge: 2017-09-11 | Disposition: A | Discharge status: Home / Self Care | Payer: 59 | Source: Ambulatory Visit | Attending: Cardiology | Admitting: Cardiology

## 2017-09-11 ENCOUNTER — Encounter (HOSPITAL_COMMUNITY): Payer: Self-pay

## 2017-09-11 DIAGNOSIS — Z952 Presence of prosthetic heart valve: Secondary | ICD-10-CM

## 2017-09-11 NOTE — Progress Notes (Signed)
I have reviewed a Home Exercise Prescription with [name] Anderson . [name] Anderson is currently exercising at home.  Pt walks daily for 30-60 minutes at local parks. The patient was advised to continue to walk 5-7 days a week for 30-60 minutes.  [name] Anderson and I discussed how to progress their exercise prescription. Pt was advised to be mindful of having rest days from exercise when feeling tired. The patient stated that they understand the exercise prescription. We reviewed exercise guidelines, target heart rate during exercise, NTG use, weather, RPE Scale, endpoints for exercise, warmup and cool down. Patient is encouraged to come to me with any questions. I will continue to follow up with the patient to assist them with progression and safety.    [name] Lair MS, ACSM CEP 10:13 AM 09/01/2017

## 2017-09-12 NOTE — Progress Notes (Signed)
Cardiac Individual Treatment Plan  Patient Details  Name: [name] Anderson MRN: [id number] Date of Birth: [birth year] Referring Provider:     CARDIAC REHAB PHASE II ORIENTATION from 08/20/2017 in [facility]  Referring Provider  Kirk Ruths, MD.      Initial Encounter Date:    CARDIAC REHAB PHASE II ORIENTATION from 08/20/2017 in [facility]  Date  08/20/17  Referring Provider  Kirk Ruths, MD.      Visit Diagnosis: S/P AVR  Patient's Home Medications on Admission:  Current Outpatient Medications:  .  acetaminophen (TYLENOL) 500 MG tablet, Take 1 tablet (500 mg total) by mouth every 6 (six) hours as needed (for pain.)., Disp: 30 tablet, Rfl: 0 .  aluminum chloride (HYPERCARE) 20 % external solution, Apply 1 application topically 3 (three) times daily as needed (for sweating.)., Disp: , Rfl:  .  Ascorbic Acid (VITAMIN C) 1000 MG tablet, Take 1,000 mg by mouth daily., Disp: , Rfl:  .  aspirin EC 81 MG tablet, Take 81 mg by mouth daily., Disp: , Rfl:  .  atorvastatin (LIPITOR) 10 MG tablet, Take 1 tablet (10 mg total) by mouth daily at 6 PM., Disp: 30 tablet, Rfl: 6 .  betamethasone valerate (VALISONE) 0.1 % cream, Apply 1 application topically 2 (two) times daily as needed (for skin irritation.)., Disp: , Rfl:  .  cetirizine (ZYRTEC) 10 MG tablet, Take 10 mg by mouth daily as needed for allergies., Disp: , Rfl:  .  ECHINACEA PO, Take 760 mg by mouth daily., Disp: , Rfl:  .  fluorometholone (FML) 0.1 % ophthalmic suspension, Place 1 drop into both eyes 3 (three) times daily as needed (for irritated/itchy eyes.)., Disp: , Rfl:  .  Glucosamine-Chondroitin (COSAMIN DS PO), Take 1 tablet by mouth 2 (two) times daily., Disp: , Rfl:  .  loratadine (CLARITIN) 10 MG tablet, Take 10 mg by mouth daily as needed for allergies., Disp: , Rfl:  .  losartan (COZAAR) 50 MG tablet, Take 1 tablet (50 mg total) by mouth daily., Disp: 90  tablet, Rfl: 3 .  metoprolol tartrate (LOPRESSOR) 25 MG tablet, Take 0.5 tablets (12.5 mg total) by mouth 2 (two) times daily., Disp: 90 tablet, Rfl: 3 .  Naphazoline-Pheniramine (OPCON-A) 0.027-0.315 % SOLN, Place 1-2 drops into both eyes 3 (three) times daily as needed (for allergy eyes.)., Disp: , Rfl:  .  NON FORMULARY, Take 2 capsules by mouth 2 (two) times daily. DoTerra (Microplex VMz) 2 Capsule twice daily, Disp: , Rfl:  .  NON FORMULARY, Take 2 capsules by mouth 2 (two) times daily. XE0 Mega "Doterra", Disp: , Rfl:  .  sildenafil (REVATIO) 20 MG tablet, Take 20 mg by mouth daily as needed (for ED). , Disp: , Rfl:  .  SUPER B COMPLEX/C PO, Take 1 tablet by mouth daily., Disp: , Rfl:  .  traMADol (ULTRAM) 50 MG tablet, Take 50 mg by mouth every 4-6 hours PRN moderate pain., Disp: 30 tablet, Rfl: 0 .  triamcinolone cream (KENALOG) 0.1 %, Apply 1 application topically daily as needed (FOR DRY SKIN/ITCHY SKIN.). Apply to area after bath (DO NOT APPLY TO FACE), Disp: , Rfl: 1  Past Medical History: Past Medical History:  Diagnosis Date  . Allergy   . Anemia   . Barrett's esophagus - short segment 03/13/2017  . Cancer ([place]) 09/11/13   testicular  . Complication of anesthesia    hard to wake  - 20 years  ago  . ED (erectile dysfunction)   . Endocarditis    [facility] 03/29/2017  . H/O aneurysm 03/05/2017  . High cholesterol   . History of basal cell carcinoma excision    02/2002   --  NOSE  &   2013  RIGHT EAR  . History of kidney stones   . Hyperlipidemia   . Mild obstructive sleep apnea    PER STUDY 01-18-2005--  NO CPAP OR MOUTH GUARD  . Moderate aortic valve insufficiency   . Multiple pulmonary nodules    PER CT--  PROBABLE  GRANULOMATOUS - patient is not aware of this  . Personal history of colonic polyps    TUBULAR ADENOMA--   . Seasonal allergies   . 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[place])   . Testicular mass    RIGHT    Tobacco Use: Social History   Tobacco Use  Smoking Status  Current Some Day Smoker  . Types: Pipe  Smokeless Tobacco Former Systems developer  . Types: Chew  . Quit date: 04/26/2011  Tobacco Comment   OCCASIONAL  PIPE SMOKER  (NEVER SMOKED CIGARETTES)    Labs: Recent Review Flowsheet Data    Labs for ITP Cardiac and Pulmonary Rehab Latest Ref Rng & Units 06/05/2017 06/05/2017 06/05/2017 06/05/2017 06/06/2017   Cholestrol 0 - 200 mg/dL - - - - -   LDLCALC 0 - 99 mg/dL - - - - -   LDLDIRECT mg/dL - - - - -   HDL >40 mg/dL - - - - -   Trlycerides <150 mg/dL - - - - -   Hemoglobin A1c 4.8 - 5.6 % - - - - -   PHART 7.350 - 7.450 7.312(L) 7.300(L) 7.342(L) - -   PCO2ART 32.0 - 48.0 mmHg 43.3 39.3 37.9 - -   HCO3 20.0 - 28.0 mmol/L 21.9 19.3(L) 20.6 - -   TCO2 22 - 32 mmol/L 23 21(L) 22 21(L) 24   ACIDBASEDEF 0.0 - 2.0 mmol/L 4.0(H) 7.0(H) 5.0(H) - -   O2SAT % 98.0 99.0 97.0 - -      Capillary Blood Glucose: Lab Results  Component Value Date   GLUCAP 102 (H) 06/09/2017   GLUCAP 107 (H) 06/08/2017   GLUCAP 111 (H) 06/08/2017   GLUCAP 111 (H) 06/07/2017   GLUCAP 130 (H) 06/07/2017     Exercise Target Goals:    Exercise Program Goal: Individual exercise prescription set using results from initial 6 min walk test and THRR while considering  patient's activity barriers and safety.   Exercise Prescription Goal: Initial exercise prescription builds to 30-45 minutes a day of aerobic activity, 2-3 days per week.  Home exercise guidelines will be given to patient during program as part of exercise prescription that the participant will acknowledge.  Activity Barriers & Risk Stratification: Activity Barriers & Cardiac Risk Stratification - 08/20/17 0851      Activity Barriers & Cardiac Risk Stratification   Activity Barriers  Other (comment)    Comments  Bulging disc in back. Right hip pain with walking.    Cardiac Risk Stratification  Moderate       6 Minute Walk: 6 Minute Walk    Row Name 08/20/17 0853         6 Minute Walk   Phase  Initial      Distance  1854 feet     Walk Time  6 minutes     # of Rest Breaks  0     MPH  3.51  METS  4.26     RPE  9     VO2 Peak  14.92     Symptoms  No     Resting HR  71 bpm     Resting BP  104/70     Resting Oxygen Saturation   99 %     Exercise Oxygen Saturation  during 6 min walk  95 %     Max Ex. HR  93 bpm     Max Ex. BP  136/88     2 Minute Post BP  106/84        Oxygen Initial Assessment:   Oxygen Re-Evaluation:   Oxygen Discharge (Final Oxygen Re-Evaluation):   Initial Exercise Prescription: Initial Exercise Prescription - 08/20/17 0900      Date of Initial Exercise RX and Referring Provider   Date  08/20/17    Referring Provider  Kirk Ruths, MD.      Treadmill   MPH  2.8    Grade  0    Minutes  10    METs  3.14      Bike   Level  1.5    Minutes  3.98    METs  10      NuStep   Level  4    SPM  85    Minutes  10    METs  3      Prescription Details   Frequency (times per week)  3    Duration  Progress to 45 minutes of aerobic exercise without signs/symptoms of physical distress      Intensity   THRR 40-80% of Max Heartrate  64-127    Ratings of Perceived Exertion  11-13    Perceived Dyspnea  0-4      Progression   Progression  Continue to progress workloads to maintain intensity without signs/symptoms of physical distress.      Resistance Training   Training Prescription  Yes    Weight  4lbs    Reps  10-15       Perform Capillary Blood Glucose checks as needed.  Exercise Prescription Changes: Exercise Prescription Changes    Row Name 08/26/17 1412 09/04/17 0800 09/11/17 1000         Response to Exercise   Blood Pressure (Admit)  106/80  118/80  112/68     Blood Pressure (Exercise)  130/80  130/80  128/84     Blood Pressure (Exit)  108/82  114/68  112/68     Heart Rate (Admit)  84 bpm  80 bpm  82 bpm     Heart Rate (Exercise)  111 bpm  109 bpm  106 bpm     Heart Rate (Exit)  84 bpm  76 bpm  82 bpm     Rating of Perceived  Exertion (Exercise)  12  12  12      Perceived Dyspnea (Exercise)  0  0  0     Symptoms  None  None   None     Comments  Pt oriented to exercise equipment  -  -     Duration  Progress to 30 minutes of  aerobic without signs/symptoms of physical distress  Continue with 30 min of aerobic exercise without signs/symptoms of physical distress.  Progress to 45 minutes of aerobic exercise without signs/symptoms of physical distress     Intensity  THRR New  THRR unchanged  THRR unchanged       Progression   Progression  Continue  to progress workloads to maintain intensity without signs/symptoms of physical distress.  Continue to progress workloads to maintain intensity without signs/symptoms of physical distress.  Continue to progress workloads to maintain intensity without signs/symptoms of physical distress.     Average METs  3.6  3.5  3.5       Resistance Training   Training Prescription  Yes  No  No     Weight  4lbs  -  4lbs     Reps  10-15  -  -     Time  10 Minutes  -  -       Interval Training   Interval Training  No  No  -       Treadmill   MPH  2.8  2.8  2.8     Grade  0  10  0     Minutes  10  10  10      METs  3.14  3.14  3.14       Bike   Level  1.5  1.5  1.5     Minutes  10  10  10      METs  3.96  3.99  3.96       NuStep   Level  4  4  4      SPM  95  95  95     Minutes  10  10  10      METs  3.7  3.3  3.4       Home Exercise Plan   Plans to continue exercise at  -  -  Home (comment) Walking      Frequency  -  -  Add 3 additional days to program exercise sessions.     Initial Home Exercises Provided  -  -  09/11/17        Exercise Comments: Exercise Comments    Row Name 08/26/17 1413 09/11/17 1006         Exercise Comments  Pt's first day of exercise. Pt is off to a great start with exercise and responded well to exericse workloads. Will continue to monitor and progress pt.   Reviewed METs, Goals and Home Exercise Program. Pt is currently walking daily. Will  continue to monitor and follow up with pt regarding exercise at home.          Exercise Goals and Review: Exercise Goals    Row Name 08/20/17 0856             Exercise Goals   Increase Physical Activity  Yes       Intervention  Provide advice, education, support and counseling about physical activity/exercise needs.;Develop an individualized exercise prescription for aerobic and resistive training based on initial evaluation findings, risk stratification, comorbidities and participant's personal goals.       Expected Outcomes  Short Term: Attend rehab on a regular basis to increase amount of physical activity.;Long Term: Exercising regularly at least 3-5 days a week.;Long Term: Add in home exercise to make exercise part of routine and to increase amount of physical activity.       Increase Strength and Stamina  Yes       Intervention  Provide advice, education, support and counseling about physical activity/exercise needs.;Develop an individualized exercise prescription for aerobic and resistive training based on initial evaluation findings, risk stratification, comorbidities and participant's personal goals.       Expected Outcomes  Short Term: Increase workloads from initial exercise prescription for resistance,  speed, and METs.;Short Term: Perform resistance training exercises routinely during rehab and add in resistance training at home;Long Term: Improve cardiorespiratory fitness, muscular endurance and strength as measured by increased METs and functional capacity (6MWT)       Able to understand and use rate of perceived exertion (RPE) scale  Yes       Intervention  Provide education and explanation on how to use RPE scale       Expected Outcomes  Short Term: Able to use RPE daily in rehab to express subjective intensity level;Long Term:  Able to use RPE to guide intensity level when exercising independently       Knowledge and understanding of Target Heart Rate Range (THRR)  Yes        Intervention  Provide education and explanation of THRR including how the numbers were predicted and where they are located for reference       Expected Outcomes  Short Term: Able to state/look up THRR;Long Term: Able to use THRR to govern intensity when exercising independently;Short Term: Able to use daily as guideline for intensity in rehab       Able to check pulse independently  Yes       Intervention  Provide education and demonstration on how to check pulse in carotid and radial arteries.;Review the importance of being able to check your own pulse for safety during independent exercise       Expected Outcomes  Short Term: Able to explain why pulse checking is important during independent exercise;Long Term: Able to check pulse independently and accurately       Understanding of Exercise Prescription  Yes       Intervention  Provide education, explanation, and written materials on patient's individual exercise prescription       Expected Outcomes  Short Term: Able to explain program exercise prescription;Long Term: Able to explain home exercise prescription to exercise independently          Exercise Goals Re-Evaluation : Exercise Goals Re-Evaluation    Row Name 09/11/17 1008             Exercise Goal Re-Evaluation   Exercise Goals Review  Increase Physical Activity;Increase Strength and Stamina;Able to understand and use rate of perceived exertion (RPE) scale;Knowledge and understanding of Target Heart Rate Range (THRR);Able to check pulse independently;Understanding of Exercise Prescription       Comments  Reviewed HEP with pt. Also reviewed RPE Scale, THRR, weather conditions, endpoints of exercise, NTG use, warm up and cool down. Pt is continuing to respond to exercise workloads.        Expected Outcomes  Pt will contine to walk daily for 30-50 minutes. Pt walks at local parks. Pt will continue to improve cardiorespiratory fitness. Will continue progress pt's workloads as tolerated.            Discharge Exercise Prescription (Final Exercise Prescription Changes): Exercise Prescription Changes - 09/11/17 1000      Response to Exercise   Blood Pressure (Admit)  112/68    Blood Pressure (Exercise)  128/84    Blood Pressure (Exit)  112/68    Heart Rate (Admit)  82 bpm    Heart Rate (Exercise)  106 bpm    Heart Rate (Exit)  82 bpm    Rating of Perceived Exertion (Exercise)  12    Perceived Dyspnea (Exercise)  0    Symptoms  None    Duration  Progress to 45 minutes of aerobic exercise without signs/symptoms of  physical distress    Intensity  THRR unchanged      Progression   Progression  Continue to progress workloads to maintain intensity without signs/symptoms of physical distress.    Average METs  3.5      Resistance Training   Training Prescription  No    Weight  4lbs      Treadmill   MPH  2.8    Grade  0    Minutes  10    METs  3.14      Bike   Level  1.5    Minutes  10    METs  3.96      NuStep   Level  4    SPM  95    Minutes  10    METs  3.4      Home Exercise Plan   Plans to continue exercise at  Home (comment) Walking     Frequency  Add 3 additional days to program exercise sessions.    Initial Home Exercises Provided  09/11/17       Nutrition:  Target Goals: Understanding of nutrition guidelines, daily intake of sodium 1500mg , cholesterol 200mg , calories 30% from fat and 7% or less from saturated fats, daily to have 5 or more servings of fruits and vegetables.  Biometrics: Pre Biometrics - 08/20/17 0856      Pre Biometrics   Height  5\' 11"  (1.803 m)    Weight  210 lb 15.7 oz (95.7 kg)    Waist Circumference  40.5 inches    Hip Circumference  41.5 inches    Waist to Hip Ratio  0.98 %    BMI (Calculated)  29.44    Triceps Skinfold  25 mm    % Body Fat  30 %    Grip Strength  51 kg    Flexibility  8 in Knees slightly bent    Single Leg Stand  11.53 seconds        Nutrition Therapy Plan and Nutrition Goals: Nutrition  Therapy & Goals - 08/26/17 0925      Nutrition Therapy   Diet  Heart Healthy      Personal Nutrition Goals   Nutrition Goal  Pt to identify and limit food sources of saturated fat, trans fat, and sodium    Personal Goal #2  Pt to identify food quantities necessary to achieve weight loss of 6-24 lb (2.7-10.9 kg) at graduation from cardiac rehab. Long-term wt loss goal wt of 185 lb desired.       Intervention Plan   Intervention  Prescribe, educate and counsel regarding individualized specific dietary modifications aiming towards targeted core components such as weight, hypertension, lipid management, diabetes, heart failure and other comorbidities.    Expected Outcomes  Short Term Goal: Understand basic principles of dietary content, such as calories, fat, sodium, cholesterol and nutrients.;Long Term Goal: Adherence to prescribed nutrition plan.       Nutrition Assessments: Nutrition Assessments - 08/26/17 0925      MEDFICTS Scores   Pre Score  48       Nutrition Goals Re-Evaluation:   Nutrition Goals Re-Evaluation:   Nutrition Goals Discharge (Final Nutrition Goals Re-Evaluation):   Psychosocial: Target Goals: Acknowledge presence or absence of significant depression and/or stress, maximize coping skills, provide positive support system. Participant is able to verbalize types and ability to use techniques and skills needed for reducing stress and depression.  Initial Review & Psychosocial Screening: Initial Psych Review & Screening - 08/20/17  0810      Initial Review   Current issues with  None Identified      Family Dynamics   Good Support System?  Yes wife/children/friends       Barriers   Psychosocial barriers to participate in program  There are no identifiable barriers or psychosocial needs.      Screening Interventions   Interventions  Encouraged to exercise       Quality of Life Scores: Quality of Life - 08/20/17 0906      Quality of Life Scores    Health/Function Pre  27.82 %    Socioeconomic Pre  27 %    Psych/Spiritual Pre  28.57 %    Family Pre  30 %    GLOBAL Pre  28.14 %      Scores of 19 and below usually indicate a poorer quality of life in these areas.  A difference of  2-3 points is a clinically meaningful difference.  A difference of 2-3 points in the total score of the Quality of Life Index has been associated with significant improvement in overall quality of life, self-image, physical symptoms, and general health in studies assessing change in quality of life.  PHQ-9: Recent Review Flowsheet Data    Depression screen [facility] 2/9 08/26/2017 02/11/2013   Decreased Interest 0 0   Down, Depressed, Hopeless 0 0   PHQ - 2 Score 0 0     Interpretation of Total Score  Total Score Depression Severity:  1-4 = Minimal depression, 5-9 = Mild depression, 10-14 = Moderate depression, 15-19 = Moderately severe depression, 20-27 = Severe depression   Psychosocial Evaluation and Intervention: Psychosocial Evaluation - 08/26/17 1002      Psychosocial Evaluation & Interventions   Interventions  Encouraged to exercise with the program and follow exercise prescription    Comments  no psychosocial needs identified, no interventions necessary.  pt enjoys spending time with his grandchildren, ages 74,4 and 2.      Expected Outcomes  pt will exhibit positive outlook with good coping skills.     Continue Psychosocial Services   No Follow up required       Psychosocial Re-Evaluation: Psychosocial Re-Evaluation    [place] Name 09/11/17 1709             Psychosocial Re-Evaluation   Current issues with  None Identified       Comments  No psychosocial needs identified. No intervention necessary.       Expected Outcomes  Pt will continue to have a positive outlook utilizing good coping skills.        Interventions  Encouraged to attend Cardiac Rehabilitation for the exercise       Continue Psychosocial Services   No Follow up required           Psychosocial Discharge (Final Psychosocial Re-Evaluation): Psychosocial Re-Evaluation - 09/11/17 1709      Psychosocial Re-Evaluation   Current issues with  None Identified    Comments  No psychosocial needs identified. No intervention necessary.    Expected Outcomes  Pt will continue to have a positive outlook utilizing good coping skills.     Interventions  Encouraged to attend Cardiac Rehabilitation for the exercise    Continue Psychosocial Services   No Follow up required       Vocational Rehabilitation: Provide vocational rehab assistance to qualifying candidates.   Vocational Rehab Evaluation & Intervention: Vocational Rehab - 08/20/17 0809      Initial Vocational Rehab Evaluation &  Intervention   Assessment shows need for Vocational Rehabilitation  No       Education: Education Goals: Education classes will be provided on a weekly basis, covering required topics. Participant will state understanding/return demonstration of topics presented.  Learning Barriers/Preferences: Learning Barriers/Preferences - 08/20/17 0849      Learning Barriers/Preferences   Learning Barriers  None    Learning Preferences  None       Education Topics: Count Your Pulse:  -Group instruction provided by verbal instruction, demonstration, patient participation and written materials to support subject.  Instructors address importance of being able to find your pulse and how to count your pulse when at home without a heart monitor.  Patients get hands on experience counting their pulse with staff help and individually.   Heart Attack, Angina, and Risk Factor Modification:  -Group instruction provided by verbal instruction, video, and written materials to support subject.  Instructors address signs and symptoms of angina and heart attacks.    Also discuss risk factors for heart disease and how to make changes to improve heart health risk factors.   Functional Fitness:  -Group instruction  provided by verbal instruction, demonstration, patient participation, and written materials to support subject.  Instructors address safety measures for doing things around the house.  Discuss how to get up and down off the floor, how to pick things up properly, how to safely get out of a chair without assistance, and balance training.   Meditation and Mindfulness:  -Group instruction provided by verbal instruction, patient participation, and written materials to support subject.  Instructor addresses importance of mindfulness and meditation practice to help reduce stress and improve awareness.  Instructor also leads participants through a meditation exercise.    CARDIAC REHAB PHASE II EXERCISE from 09/11/2017 in [place]  Date  09/11/17  Instruction Review Code  2- Demonstrated Understanding      Stretching for Flexibility and Mobility:  -Group instruction provided by verbal instruction, patient participation, and written materials to support subject.  Instructors lead participants through series of stretches that are designed to increase flexibility thus improving mobility.  These stretches are additional exercise for major muscle groups that are typically performed during regular warm up and cool down.   Hands Only CPR:  -Group verbal, video, and participation provides a basic overview of AHA guidelines for community CPR. Role-play of emergencies allow participants the opportunity to practice calling for help and chest compression technique with discussion of AED use.   Hypertension: -Group verbal and written instruction that provides a basic overview of hypertension including the most recent diagnostic guidelines, risk factor reduction with self-care instructions and medication management.    Nutrition I class: Heart Healthy Eating:  -Group instruction provided by PowerPoint slides, verbal discussion, and written materials to support subject matter. The  instructor gives an explanation and review of the Therapeutic Lifestyle Changes diet recommendations, which includes a discussion on lipid goals, dietary fat, sodium, fiber, plant stanol/sterol esters, sugar, and the components of a well-balanced, healthy diet.   Nutrition II class: Lifestyle Skills:  -Group instruction provided by PowerPoint slides, verbal discussion, and written materials to support subject matter. The instructor gives an explanation and review of label reading, grocery shopping for heart health, heart healthy recipe modifications, and ways to make healthier choices when eating out.   Diabetes Question & Answer:  -Group instruction provided by PowerPoint slides, verbal discussion, and written materials to support subject matter. The instructor gives an explanation  and review of diabetes co-morbidities, pre- and post-prandial blood glucose goals, pre-exercise blood glucose goals, signs, symptoms, and treatment of hypoglycemia and hyperglycemia, and foot care basics.   Diabetes Blitz:  -Group instruction provided by PowerPoint slides, verbal discussion, and written materials to support subject matter. The instructor gives an explanation and review of the physiology behind type 1 and type 2 diabetes, diabetes medications and rational behind using different medications, pre- and post-prandial blood glucose recommendations and Hemoglobin A1c goals, diabetes diet, and exercise including blood glucose guidelines for exercising safely.    Portion Distortion:  -Group instruction provided by PowerPoint slides, verbal discussion, written materials, and food models to support subject matter. The instructor gives an explanation of serving size versus portion size, changes in portions sizes over the last 20 years, and what consists of a serving from each food group.   Stress Management:  -Group instruction provided by verbal instruction, video, and written materials to support subject  matter.  Instructors review role of stress in heart disease and how to cope with stress positively.     Exercising on Your Own:  -Group instruction provided by verbal instruction, power point, and written materials to support subject.  Instructors discuss benefits of exercise, components of exercise, frequency and intensity of exercise, and end points for exercise.  Also discuss use of nitroglycerin and activating EMS.  Review options of places to exercise outside of rehab.  Review guidelines for sex with heart disease.   Cardiac Drugs I:  -Group instruction provided by verbal instruction and written materials to support subject.  Instructor reviews cardiac drug classes: antiplatelets, anticoagulants, beta blockers, and statins.  Instructor discusses reasons, side effects, and lifestyle considerations for each drug class.   CARDIAC REHAB PHASE II EXERCISE from 09/11/2017 in [place]  Date  09/04/17  Instruction Review Code  2- Demonstrated Understanding      Cardiac Drugs II:  -Group instruction provided by verbal instruction and written materials to support subject.  Instructor reviews cardiac drug classes: angiotensin converting enzyme inhibitors (ACE-I), angiotensin II receptor blockers (ARBs), nitrates, and calcium channel blockers.  Instructor discusses reasons, side effects, and lifestyle considerations for each drug class.   Anatomy and Physiology of the Circulatory System:  Group verbal and written instruction and models provide basic cardiac anatomy and physiology, with the coronary electrical and arterial systems. Review of: AMI, Angina, Valve disease, Heart Failure, Peripheral Artery Disease, Cardiac Arrhythmia, Pacemakers, and the ICD.   Other Education:  -Group or individual verbal, written, or video instructions that support the educational goals of the cardiac rehab program.   Holiday Eating Survival Tips:  -Group instruction provided by  PowerPoint slides, verbal discussion, and written materials to support subject matter. The instructor gives patients tips, tricks, and techniques to help them not only survive but enjoy the holidays despite the onslaught of food that accompanies the holidays.   Knowledge Questionnaire Score: Knowledge Questionnaire Score - 08/20/17 5003      Knowledge Questionnaire Score   Pre Score  23/24       Core Components/Risk Factors/Patient Goals at Admission: Personal Goals and Risk Factors at Admission - 08/20/17 0903      Core Components/Risk Factors/Patient Goals on Admission    Weight Management  Weight Loss;Yes    Intervention  Weight Management: Develop a combined nutrition and exercise program designed to reach desired caloric intake, while maintaining appropriate intake of nutrient and fiber, sodium and fats, and appropriate energy expenditure required for  the weight goal.;Weight Management: Provide education and appropriate resources to help participant work on and attain dietary goals.    Admit Weight  210 lb 15.7 oz (95.7 kg)    Goal Weight: Short Term  204 lb (92.5 kg)    Goal Weight: Long Term  185 lb (83.9 kg)    Expected Outcomes  Short Term: Continue to assess and modify interventions until short term weight is achieved;Long Term: Adherence to nutrition and physical activity/exercise program aimed toward attainment of established weight goal;Weight Loss: Understanding of general recommendations for a balanced deficit meal plan, which promotes 1-2 lb weight loss per week and includes a negative energy balance of [phone] kcal/d;Understanding recommendations for meals to include 15-35% energy as protein, 25-35% energy from fat, 35-60% energy from carbohydrates, less than 200mg  of dietary cholesterol, 20-35 gm of total fiber daily;Understanding of distribution of calorie intake throughout the day with the consumption of 4-5 meals/snacks    Lipids  Yes    Intervention  Provide education and  support for participant on nutrition & aerobic/resistive exercise along with prescribed medications to achieve LDL 70mg , HDL >40mg .    Expected Outcomes  Short Term: Participant states understanding of desired cholesterol values and is compliant with medications prescribed. Participant is following exercise prescription and nutrition guidelines.;Long Term: Cholesterol controlled with medications as prescribed, with individualized exercise RX and with personalized nutrition plan. Value goals: LDL < 70mg , HDL > 40 mg.       Core Components/Risk Factors/Patient Goals Review:  Goals and Risk Factor Review    Row Name 08/26/17 1001 09/11/17 1708           Core Components/Risk Factors/Patient Goals Review   Personal Goals Review  Weight Management/Obesity;Hypertension  Weight Management/Obesity;Hypertension      Review  pt with CAD RF demonstrates eagernes to participate in CR program.  pt personal goals are to feel better about his heart health.   Pt with CAD RF eager to participate in CR program.  Pt feels that he has increased his stamina and has seen a difference in the amount of time he walks at home with his wife.      Expected Outcomes  pt will participate in CR exercise,nutrition and lifestyle modification education opportunities.   Pt will participate in CR exercise,nutrition and lifestyle modification education opportunities.          Core Components/Risk Factors/Patient Goals at Discharge (Final Review):  Goals and Risk Factor Review - 09/11/17 1708      Core Components/Risk Factors/Patient Goals Review   Personal Goals Review  Weight Management/Obesity;Hypertension    Review  Pt with CAD RF eager to participate in CR program.  Pt feels that he has increased his stamina and has seen a difference in the amount of time he walks at home with his wife.    Expected Outcomes  Pt will participate in CR exercise,nutrition and lifestyle modification education opportunities.        ITP  Comments: ITP Comments    Row Name 08/20/17 [phone] 08/26/17 0959 09/11/17 1707       ITP Comments  Dr. Fransico Him, Medical Director   pt started group exercise session. pt oriented to equipment and safety guidelines. pt tolerated light activity without difficulty. pt demonstraates eagerness to participate in CR program.  30 Day ITP. Pt off to a great start with exercise.  He is tolerating exercise well.        Comments: See ITP Comments.

## 2017-09-13 ENCOUNTER — Encounter (HOSPITAL_COMMUNITY): Payer: 59

## 2017-09-16 ENCOUNTER — Encounter (HOSPITAL_COMMUNITY)
Admission: RE | Admit: 2017-09-16 | Discharge: 2017-09-16 | Disposition: A | Discharge status: Home / Self Care | Payer: 59 | Source: Ambulatory Visit | Attending: Cardiology | Admitting: Cardiology

## 2017-09-16 DIAGNOSIS — Z952 Presence of prosthetic heart valve: Secondary | ICD-10-CM | POA: Diagnosis not present

## 2017-09-18 ENCOUNTER — Encounter (HOSPITAL_COMMUNITY)
Admission: RE | Admit: 2017-09-18 | Discharge: 2017-09-18 | Disposition: A | Discharge status: Home / Self Care | Payer: 59 | Source: Ambulatory Visit | Attending: Cardiology | Admitting: Cardiology

## 2017-09-18 DIAGNOSIS — Z952 Presence of prosthetic heart valve: Secondary | ICD-10-CM | POA: Diagnosis not present

## 2017-09-20 ENCOUNTER — Encounter (HOSPITAL_COMMUNITY): Payer: 59

## 2017-09-25 ENCOUNTER — Encounter (HOSPITAL_COMMUNITY)
Admission: RE | Admit: 2017-09-25 | Discharge: 2017-09-25 | Disposition: A | Discharge status: Home / Self Care | Payer: 59 | Source: Ambulatory Visit | Attending: Cardiology | Admitting: Cardiology

## 2017-09-25 DIAGNOSIS — Z952 Presence of prosthetic heart valve: Secondary | ICD-10-CM

## 2017-09-25 NOTE — Progress Notes (Signed)
[name] Anderson 62 y.o. male DOB 1955-12-31 MRN [id number]       Nutrition  1. S/P AVR    Current tobacco use? Yes  Nutrition Note Spoke with pt. Nutrition Plan and Nutrition Survey goals reviewed with pt. Pt is following a Heart Healthy diet. Pt wants to lose wt. Pt has been trying to lose wt by exercising (walking) more. Wt loss tips reviewed. Pt expressed understanding of the information reviewed. Pt aware of nutrition education classes offered.  Nutrition Diagnosis ? Food-and nutrition-related knowledge deficit related to lack of exposure to information as related to diagnosis of heart procedure ? Overweight related to excessive energy intake as evidenced by a BMI 29.4  Nutrition Goal(s):  ? Pt to identify and limit food sources of saturated fat, trans fat, and sodium ? Pt to identify food quantities necessary to achieve weight loss of 6-24 lb (2.7-10.9 kg) at graduation from cardiac rehab. Long-term wt loss goal wt of 185 lb desired.   Nutrition Intervention ? Pt's individual nutrition plan reviewed with pt. ? Benefits of adopting Heart Healthy diet discussed when Med[employer] reviewed.   ? Pt given handouts for: ? Nutrition I class ? Nutrition II class  Plan:  Pt to attend nutrition classes ? Portion Distortion  Will provide client-centered nutrition education as part of interdisciplinary care.   Monitor and evaluate progress toward nutrition goal with team.  Derek Mound, M.Ed, RD, LDN, CDE 09/25/2017 8:16 AM

## 2017-09-27 ENCOUNTER — Encounter (HOSPITAL_COMMUNITY): Payer: 59

## 2017-09-30 ENCOUNTER — Encounter (HOSPITAL_COMMUNITY)
Admission: RE | Admit: 2017-09-30 | Discharge: 2017-09-30 | Disposition: A | Discharge status: Home / Self Care | Payer: 59 | Source: Ambulatory Visit | Attending: Cardiology | Admitting: Cardiology

## 2017-09-30 DIAGNOSIS — G4733 Obstructive sleep apnea (adult) (pediatric): Secondary | ICD-10-CM | POA: Diagnosis not present

## 2017-09-30 DIAGNOSIS — Z79899 Other long term (current) drug therapy: Secondary | ICD-10-CM | POA: Diagnosis not present

## 2017-09-30 DIAGNOSIS — E7800 Pure hypercholesterolemia, unspecified: Secondary | ICD-10-CM | POA: Diagnosis not present

## 2017-09-30 DIAGNOSIS — Z7982 Long term (current) use of aspirin: Secondary | ICD-10-CM | POA: Diagnosis not present

## 2017-09-30 DIAGNOSIS — Z952 Presence of prosthetic heart valve: Secondary | ICD-10-CM

## 2017-09-30 DIAGNOSIS — E785 Hyperlipidemia, unspecified: Secondary | ICD-10-CM | POA: Insufficient documentation

## 2017-09-30 DIAGNOSIS — F17200 Nicotine dependence, unspecified, uncomplicated: Secondary | ICD-10-CM | POA: Diagnosis not present

## 2017-10-02 ENCOUNTER — Encounter (HOSPITAL_COMMUNITY)
Admission: RE | Admit: 2017-10-02 | Discharge: 2017-10-02 | Disposition: A | Discharge status: Home / Self Care | Payer: 59 | Source: Ambulatory Visit | Attending: Cardiology | Admitting: Cardiology

## 2017-10-02 DIAGNOSIS — Z952 Presence of prosthetic heart valve: Secondary | ICD-10-CM

## 2017-10-04 ENCOUNTER — Encounter (HOSPITAL_COMMUNITY): Payer: 59

## 2017-10-07 ENCOUNTER — Encounter (HOSPITAL_COMMUNITY)
Admission: RE | Admit: 2017-10-07 | Discharge: 2017-10-07 | Disposition: A | Discharge status: Home / Self Care | Payer: 59 | Source: Ambulatory Visit | Attending: Cardiology | Admitting: Cardiology

## 2017-10-07 DIAGNOSIS — Z952 Presence of prosthetic heart valve: Secondary | ICD-10-CM | POA: Diagnosis not present

## 2017-10-09 ENCOUNTER — Encounter (HOSPITAL_COMMUNITY)
Admission: RE | Admit: 2017-10-09 | Discharge: 2017-10-09 | Disposition: A | Discharge status: Home / Self Care | Payer: 59 | Source: Ambulatory Visit | Attending: Cardiology | Admitting: Cardiology

## 2017-10-09 ENCOUNTER — Encounter (HOSPITAL_COMMUNITY): Payer: Self-pay

## 2017-10-09 DIAGNOSIS — Z952 Presence of prosthetic heart valve: Secondary | ICD-10-CM

## 2017-10-10 NOTE — Progress Notes (Signed)
Cardiac Individual Treatment Plan  Patient Details  Name: [name] Anderson MRN: [id number] Date of Birth: [birth year] Referring Provider:     CARDIAC REHAB PHASE II ORIENTATION from 08/20/2017 in [facility]  Referring Provider  Kirk Ruths, MD.      Initial Encounter Date:    CARDIAC REHAB PHASE II ORIENTATION from 08/20/2017 in [facility]  Date  08/20/17  Referring Provider  Kirk Ruths, MD.      Visit Diagnosis: S/P AVR  Patient's Home Medications on Admission:  Current Outpatient Medications:  .  acetaminophen (TYLENOL) 500 MG tablet, Take 1 tablet (500 mg total) by mouth every 6 (six) hours as needed (for pain.)., Disp: 30 tablet, Rfl: 0 .  aluminum chloride (HYPERCARE) 20 % external solution, Apply 1 application topically 3 (three) times daily as needed (for sweating.)., Disp: , Rfl:  .  Ascorbic Acid (VITAMIN C) 1000 MG tablet, Take 1,000 mg by mouth daily., Disp: , Rfl:  .  aspirin EC 81 MG tablet, Take 81 mg by mouth daily., Disp: , Rfl:  .  atorvastatin (LIPITOR) 10 MG tablet, Take 1 tablet (10 mg total) by mouth daily at 6 PM., Disp: 30 tablet, Rfl: 6 .  betamethasone valerate (VALISONE) 0.1 % cream, Apply 1 application topically 2 (two) times daily as needed (for skin irritation.)., Disp: , Rfl:  .  cetirizine (ZYRTEC) 10 MG tablet, Take 10 mg by mouth daily as needed for allergies., Disp: , Rfl:  .  ECHINACEA PO, Take 760 mg by mouth daily., Disp: , Rfl:  .  fluorometholone (FML) 0.1 % ophthalmic suspension, Place 1 drop into both eyes 3 (three) times daily as needed (for irritated/itchy eyes.)., Disp: , Rfl:  .  Glucosamine-Chondroitin (COSAMIN DS PO), Take 1 tablet by mouth 2 (two) times daily., Disp: , Rfl:  .  loratadine (CLARITIN) 10 MG tablet, Take 10 mg by mouth daily as needed for allergies., Disp: , Rfl:  .  losartan (COZAAR) 50 MG tablet, Take 1 tablet (50 mg total) by mouth daily., Disp: 90  tablet, Rfl: 3 .  metoprolol tartrate (LOPRESSOR) 25 MG tablet, Take 0.5 tablets (12.5 mg total) by mouth 2 (two) times daily., Disp: 90 tablet, Rfl: 3 .  Naphazoline-Pheniramine (OPCON-A) 0.027-0.315 % SOLN, Place 1-2 drops into both eyes 3 (three) times daily as needed (for allergy eyes.)., Disp: , Rfl:  .  NON FORMULARY, Take 2 capsules by mouth 2 (two) times daily. DoTerra (Microplex VMz) 2 Capsule twice daily, Disp: , Rfl:  .  NON FORMULARY, Take 2 capsules by mouth 2 (two) times daily. XE0 Mega "Doterra", Disp: , Rfl:  .  sildenafil (REVATIO) 20 MG tablet, Take 20 mg by mouth daily as needed (for ED). , Disp: , Rfl:  .  SUPER B COMPLEX/C PO, Take 1 tablet by mouth daily., Disp: , Rfl:  .  traMADol (ULTRAM) 50 MG tablet, Take 50 mg by mouth every 4-6 hours PRN moderate pain., Disp: 30 tablet, Rfl: 0 .  triamcinolone cream (KENALOG) 0.1 %, Apply 1 application topically daily as needed (FOR DRY SKIN/ITCHY SKIN.). Apply to area after bath (DO NOT APPLY TO FACE), Disp: , Rfl: 1  Past Medical History: Past Medical History:  Diagnosis Date  . Allergy   . Anemia   . Barrett's esophagus - short segment 03/13/2017  . Cancer ([place]) 09/11/13   testicular  . Complication of anesthesia    hard to wake  - 20 years  ago  . ED (erectile dysfunction)   . Endocarditis    [facility] 03/29/2017  . H/O aneurysm 03/05/2017  . High cholesterol   . History of basal cell carcinoma excision    02/2002   --  NOSE  &   2013  RIGHT EAR  . History of kidney stones   . Hyperlipidemia   . Mild obstructive sleep apnea    PER STUDY 01-18-2005--  NO CPAP OR MOUTH GUARD  . Moderate aortic valve insufficiency   . Multiple pulmonary nodules    PER CT--  PROBABLE  GRANULOMATOUS - patient is not aware of this  . Personal history of colonic polyps    TUBULAR ADENOMA--   . Seasonal allergies   . 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[place])   . Testicular mass    RIGHT    Tobacco Use: Social History   Tobacco Use  Smoking Status  Current Some Day Smoker  . Types: Pipe  Smokeless Tobacco Former Systems developer  . Types: Chew  . Quit date: 04/26/2011  Tobacco Comment   OCCASIONAL  PIPE SMOKER  (NEVER SMOKED CIGARETTES)    Labs: Recent Review Flowsheet Data    Labs for ITP Cardiac and Pulmonary Rehab Latest Ref Rng & Units 06/05/2017 06/05/2017 06/05/2017 06/05/2017 06/06/2017   Cholestrol 0 - 200 mg/dL - - - - -   LDLCALC 0 - 99 mg/dL - - - - -   LDLDIRECT mg/dL - - - - -   HDL >40 mg/dL - - - - -   Trlycerides <150 mg/dL - - - - -   Hemoglobin A1c 4.8 - 5.6 % - - - - -   PHART 7.350 - 7.450 7.312(L) 7.300(L) 7.342(L) - -   PCO2ART 32.0 - 48.0 mmHg 43.3 39.3 37.9 - -   HCO3 20.0 - 28.0 mmol/L 21.9 19.3(L) 20.6 - -   TCO2 22 - 32 mmol/L 23 21(L) 22 21(L) 24   ACIDBASEDEF 0.0 - 2.0 mmol/L 4.0(H) 7.0(H) 5.0(H) - -   O2SAT % 98.0 99.0 97.0 - -      Capillary Blood Glucose: Lab Results  Component Value Date   GLUCAP 102 (H) 06/09/2017   GLUCAP 107 (H) 06/08/2017   GLUCAP 111 (H) 06/08/2017   GLUCAP 111 (H) 06/07/2017   GLUCAP 130 (H) 06/07/2017     Exercise Target Goals:    Exercise Program Goal: Individual exercise prescription set using results from initial 6 min walk test and THRR while considering  patient's activity barriers and safety.   Exercise Prescription Goal: Initial exercise prescription builds to 30-45 minutes a day of aerobic activity, 2-3 days per week.  Home exercise guidelines will be given to patient during program as part of exercise prescription that the participant will acknowledge.  Activity Barriers & Risk Stratification: Activity Barriers & Cardiac Risk Stratification - 08/20/17 0851      Activity Barriers & Cardiac Risk Stratification   Activity Barriers  Other (comment)    Comments  Bulging disc in back. Right hip pain with walking.    Cardiac Risk Stratification  Moderate       6 Minute Walk: 6 Minute Walk    Row Name 08/20/17 0853         6 Minute Walk   Phase  Initial      Distance  1854 feet     Walk Time  6 minutes     # of Rest Breaks  0     MPH  3.51  METS  4.26     RPE  9     VO2 Peak  14.92     Symptoms  No     Resting HR  71 bpm     Resting BP  104/70     Resting Oxygen Saturation   99 %     Exercise Oxygen Saturation  during 6 min walk  95 %     Max Ex. HR  93 bpm     Max Ex. BP  136/88     2 Minute Post BP  106/84        Oxygen Initial Assessment:   Oxygen Re-Evaluation:   Oxygen Discharge (Final Oxygen Re-Evaluation):   Initial Exercise Prescription: Initial Exercise Prescription - 08/20/17 0900      Date of Initial Exercise RX and Referring Provider   Date  08/20/17    Referring Provider  Kirk Ruths, MD.      Treadmill   MPH  2.8    Grade  0    Minutes  10    METs  3.14      Bike   Level  1.5    Minutes  3.98    METs  10      NuStep   Level  4    SPM  85    Minutes  10    METs  3      Prescription Details   Frequency (times per week)  3    Duration  Progress to 45 minutes of aerobic exercise without signs/symptoms of physical distress      Intensity   THRR 40-80% of Max Heartrate  64-127    Ratings of Perceived Exertion  11-13    Perceived Dyspnea  0-4      Progression   Progression  Continue to progress workloads to maintain intensity without signs/symptoms of physical distress.      Resistance Training   Training Prescription  Yes    Weight  4lbs    Reps  10-15       Perform Capillary Blood Glucose checks as needed.  Exercise Prescription Changes: Exercise Prescription Changes    Row Name 08/26/17 1412 09/04/17 0800 09/11/17 1000 09/30/17 1048 10/09/17 1512     Response to Exercise   Blood Pressure (Admit)  106/80  118/80  112/68  114/90  100/80   Blood Pressure (Exercise)  130/80  130/80  128/84  140/90  140/76   Blood Pressure (Exit)  108/82  114/68  112/68  120/84  110/80   Heart Rate (Admit)  84 bpm  80 bpm  82 bpm  77 bpm  96 bpm   Heart Rate (Exercise)  111 bpm  109 bpm  106 bpm   106 bpm  110 bpm   Heart Rate (Exit)  84 bpm  76 bpm  82 bpm  77 bpm  96 bpm   Rating of Perceived Exertion (Exercise)  _0 Perceived Dyspnea (Exercise)  0  0  0  0  0   Symptoms  None  None   None  None  None   Comments  Pt oriented to exercise equipment  -  -  -  -   Duration  Progress to 30 minutes of  aerobic without signs/symptoms of physical distress  Continue with 30 min of aerobic exercise without signs/symptoms of physical distress.  Progress to 45 minutes of aerobic exercise without signs/symptoms of physical distress  Progress to 45 minutes of aerobic exercise without signs/symptoms of physical distress  Continue with 45 min of aerobic exercise without signs/symptoms of physical distress.   Intensity  THRR New  THRR unchanged  THRR unchanged  THRR unchanged  THRR unchanged     Progression   Progression  Continue to progress workloads to maintain intensity without signs/symptoms of physical distress.  Continue to progress workloads to maintain intensity without signs/symptoms of physical distress.  Continue to progress workloads to maintain intensity without signs/symptoms of physical distress.  Continue to progress workloads to maintain intensity without signs/symptoms of physical distress.  Continue to progress workloads to maintain intensity without signs/symptoms of physical distress.   Average METs  3.6  3.5  3.5  4.36  4.59     Resistance Training   Training Prescription  Yes  No  No  Yes  No   Weight  4lbs  -  4lbs  5lbs  -   Reps  10-15  -  -  10-15  -   Time  10 Minutes  -  -  10 Minutes  -     Interval Training   Interval Training  No  No  -  No  No     Treadmill   MPH  2.8  2.8  2._0 Grade  0  10  0  3  3   Minutes  _1 METs  3.14  3.14  3.14  4.54  4.54     Bike   Level  1.5  1.5  1.5  1.5  1.8   Minutes  _2 METs  3.96  3.99  3.96  3.95  4.52     NuStep   Level  _3 SPM  95  95  95  95  95    Minutes  _4 METs  3.7  3.3  3.4  4.6  4.7     Home Exercise Plan   Plans to continue exercise at  -  -  Home (comment) Walking   Home (comment)  Home (comment) Walking   Frequency  -  -  Add 3 additional days to program exercise sessions.  Add 2 additional days to program exercise sessions.  Add 2 additional days to program exercise sessions.   Initial Home Exercises Provided  -  -  09/11/17  09/11/17  09/11/17      Exercise Comments: Exercise Comments    Row Name 08/26/17 1413 09/11/17 1006 10/10/17 1514       Exercise Comments  Pt's first day of exercise. Pt is off to a great start with exercise and responded well to exericse workloads. Will continue to monitor and progress pt.   Reviewed METs, Goals and Home Exercise Program. Pt is currently walking daily. Will continue to monitor and follow up with pt regarding exercise at home.   Reviewed METs and Goals. Pt is responding well to exercise prescription. Will continue to monitor and progress pt as tolerated.         Exercise Goals and Review: Exercise Goals    Row Name 08/20/17 0856             Exercise Goals   Increase Physical Activity  Yes  Intervention  Provide advice, education, support and counseling about physical activity/exercise needs.;Develop an individualized exercise prescription for aerobic and resistive training based on initial evaluation findings, risk stratification, comorbidities and participant's personal goals.       Expected Outcomes  Short Term: Attend rehab on a regular basis to increase amount of physical activity.;Long Term: Exercising regularly at least 3-5 days a week.;Long Term: Add in home exercise to make exercise part of routine and to increase amount of physical activity.       Increase Strength and Stamina  Yes       Intervention  Provide advice, education, support and counseling about physical activity/exercise needs.;Develop an individualized exercise prescription for  aerobic and resistive training based on initial evaluation findings, risk stratification, comorbidities and participant's personal goals.       Expected Outcomes  Short Term: Increase workloads from initial exercise prescription for resistance, speed, and METs.;Short Term: Perform resistance training exercises routinely during rehab and add in resistance training at home;Long Term: Improve cardiorespiratory fitness, muscular endurance and strength as measured by increased METs and functional capacity (6MWT)       Able to understand and use rate of perceived exertion (RPE) scale  Yes       Intervention  Provide education and explanation on how to use RPE scale       Expected Outcomes  Short Term: Able to use RPE daily in rehab to express subjective intensity level;Long Term:  Able to use RPE to guide intensity level when exercising independently       Knowledge and understanding of Target Heart Rate Range (THRR)  Yes       Intervention  Provide education and explanation of THRR including how the numbers were predicted and where they are located for reference       Expected Outcomes  Short Term: Able to state/look up THRR;Long Term: Able to use THRR to govern intensity when exercising independently;Short Term: Able to use daily as guideline for intensity in rehab       Able to check pulse independently  Yes       Intervention  Provide education and demonstration on how to check pulse in carotid and radial arteries.;Review the importance of being able to check your own pulse for safety during independent exercise       Expected Outcomes  Short Term: Able to explain why pulse checking is important during independent exercise;Long Term: Able to check pulse independently and accurately       Understanding of Exercise Prescription  Yes       Intervention  Provide education, explanation, and written materials on patient's individual exercise prescription       Expected Outcomes  Short Term: Able to explain  program exercise prescription;Long Term: Able to explain home exercise prescription to exercise independently          Exercise Goals Re-Evaluation : Exercise Goals Re-Evaluation    [place] Name 09/11/17 1008 10/10/17 1517           Exercise Goal Re-Evaluation   Exercise Goals Review  Increase Physical Activity;Increase Strength and Stamina;Able to understand and use rate of perceived exertion (RPE) scale;Knowledge and understanding of Target Heart Rate Range (THRR);Able to check pulse independently;Understanding of Exercise Prescription  Increase Physical Activity;Understanding of Exercise Prescription      Comments  Reviewed HEP with pt. Also reviewed RPE Scale, THRR, weather conditions, endpoints of exercise, NTG use, warm up and cool down. Pt is continuing to respond to exercise  workloads.   Pt is continuing to respond well to exercise prescription. Will work with pt to increase workloads. Will continue to monitor pt's progress.       Expected Outcomes  Pt will contine to walk daily for 30-50 minutes. Pt walks at local parks. Pt will continue to improve cardiorespiratory fitness. Will continue progress pt's workloads as tolerated.  Pt will continue to walk at home for exercise and build strength. Will continue to work with pt to increase MET level.           Discharge Exercise Prescription (Final Exercise Prescription Changes): Exercise Prescription Changes - 10/09/17 1512      Response to Exercise   Blood Pressure (Admit)  100/80    Blood Pressure (Exercise)  140/76    Blood Pressure (Exit)  110/80    Heart Rate (Admit)  96 bpm    Heart Rate (Exercise)  110 bpm    Heart Rate (Exit)  96 bpm    Rating of Perceived Exertion (Exercise)  12    Perceived Dyspnea (Exercise)  0    Symptoms  None    Duration  Continue with 45 min of aerobic exercise without signs/symptoms of physical distress.    Intensity  THRR unchanged      Progression   Progression  Continue to progress workloads to  maintain intensity without signs/symptoms of physical distress.    Average METs  4.59      Resistance Training   Training Prescription  No      Interval Training   Interval Training  No      Treadmill   MPH  3    Grade  3    Minutes  10    METs  4.54      Bike   Level  1.8    Minutes  10    METs  4.52      NuStep   Level  5    SPM  95    Minutes  10    METs  4.7      Home Exercise Plan   Plans to continue exercise at  Home (comment) Walking    Frequency  Add 2 additional days to program exercise sessions.    Initial Home Exercises Provided  09/11/17       Nutrition:  Target Goals: Understanding of nutrition guidelines, daily intake of sodium '1500mg'$ , cholesterol '200mg'$ , calories 30% from fat and 7% or less from saturated fats, daily to have 5 or more servings of fruits and vegetables.  Biometrics: Pre Biometrics - 08/20/17 0856      Pre Biometrics   Height  '5\' 11"'$  (1.803 m)    Weight  210 lb 15.7 oz (95.7 kg)    Waist Circumference  40.5 inches    Hip Circumference  41.5 inches    Waist to Hip Ratio  0.98 %    BMI (Calculated)  29.44    Triceps Skinfold  25 mm    % Body Fat  30 %    Grip Strength  51 kg    Flexibility  8 in Knees slightly bent    Single Leg Stand  11.53 seconds        Nutrition Therapy Plan and Nutrition Goals: Nutrition Therapy & Goals - 08/26/17 0925      Nutrition Therapy   Diet  Heart Healthy      Personal Nutrition Goals   Nutrition Goal  Pt to identify and limit food sources of saturated  fat, trans fat, and sodium    Personal Goal #2  Pt to identify food quantities necessary to achieve weight loss of 6-24 lb (2.7-10.9 kg) at graduation from cardiac rehab. Long-term wt loss goal wt of 185 lb desired.       Intervention Plan   Intervention  Prescribe, educate and counsel regarding individualized specific dietary modifications aiming towards targeted core components such as weight, hypertension, lipid management, diabetes, heart  failure and other comorbidities.    Expected Outcomes  Short Term Goal: Understand basic principles of dietary content, such as calories, fat, sodium, cholesterol and nutrients.;Long Term Goal: Adherence to prescribed nutrition plan.       Nutrition Assessments: Nutrition Assessments - 08/26/17 0925      MEDFICTS Scores   Pre Score  48       Nutrition Goals Re-Evaluation:   Nutrition Goals Re-Evaluation:   Nutrition Goals Discharge (Final Nutrition Goals Re-Evaluation):   Psychosocial: Target Goals: Acknowledge presence or absence of significant depression and/or stress, maximize coping skills, provide positive support system. Participant is able to verbalize types and ability to use techniques and skills needed for reducing stress and depression.  Initial Review & Psychosocial Screening: Initial Psych Review & Screening - 08/20/17 0810      Initial Review   Current issues with  None Identified      Family Dynamics   Good Support System?  Yes wife/children/friends       Barriers   Psychosocial barriers to participate in program  There are no identifiable barriers or psychosocial needs.      Screening Interventions   Interventions  Encouraged to exercise       Quality of Life Scores: Quality of Life - 08/20/17 0906      Quality of Life Scores   Health/Function Pre  27.82 %    Socioeconomic Pre  27 %    Psych/Spiritual Pre  28.57 %    Family Pre  30 %    GLOBAL Pre  28.14 %      Scores of 19 and below usually indicate a poorer quality of life in these areas.  A difference of  2-3 points is a clinically meaningful difference.  A difference of 2-3 points in the total score of the Quality of Life Index has been associated with significant improvement in overall quality of life, self-image, physical symptoms, and general health in studies assessing change in quality of life.  PHQ-9: Recent Review Flowsheet Data    Depression screen [facility] 2/9 08/26/2017 02/11/2013    Decreased Interest 0 0   Down, Depressed, Hopeless 0 0   PHQ - 2 Score 0 0     Interpretation of Total Score  Total Score Depression Severity:  1-4 = Minimal depression, 5-9 = Mild depression, 10-14 = Moderate depression, 15-19 = Moderately severe depression, 20-27 = Severe depression   Psychosocial Evaluation and Intervention: Psychosocial Evaluation - 08/26/17 1002      Psychosocial Evaluation & Interventions   Interventions  Encouraged to exercise with the program and follow exercise prescription    Comments  no psychosocial needs identified, no interventions necessary.  pt enjoys spending time with his grandchildren, ages 26,4 and 2.      Expected Outcomes  pt will exhibit positive outlook with good coping skills.     Continue Psychosocial Services   No Follow up required       Psychosocial Re-Evaluation: Psychosocial Re-Evaluation    [place] Name 09/11/17 1709 10/09/17 1023  Psychosocial Re-Evaluation   Current issues with  None Identified  None Identified      Comments  No psychosocial needs identified. No intervention necessary.  No psychosocial needs identified. No intervention necessary.      Expected Outcomes  Pt will continue to have a positive outlook utilizing good coping skills.   Pt will continue to have a positive outlook utilizing good coping skills.       Interventions  Encouraged to attend Cardiac Rehabilitation for the exercise  Encouraged to attend Cardiac Rehabilitation for the exercise      Continue Psychosocial Services   No Follow up required  No Follow up required         Psychosocial Discharge (Final Psychosocial Re-Evaluation): Psychosocial Re-Evaluation - 10/09/17 1023      Psychosocial Re-Evaluation   Current issues with  None Identified    Comments  No psychosocial needs identified. No intervention necessary.    Expected Outcomes  Pt will continue to have a positive outlook utilizing good coping skills.     Interventions  Encouraged to  attend Cardiac Rehabilitation for the exercise    Continue Psychosocial Services   No Follow up required       Vocational Rehabilitation: Provide vocational rehab assistance to qualifying candidates.   Vocational Rehab Evaluation & Intervention: Vocational Rehab - 08/20/17 0809      Initial Vocational Rehab Evaluation & Intervention   Assessment shows need for Vocational Rehabilitation  No       Education: Education Goals: Education classes will be provided on a weekly basis, covering required topics. Participant will state understanding/return demonstration of topics presented.  Learning Barriers/Preferences: Learning Barriers/Preferences - 08/20/17 0849      Learning Barriers/Preferences   Learning Barriers  None    Learning Preferences  None       Education Topics: Count Your Pulse:  -Group instruction provided by verbal instruction, demonstration, patient participation and written materials to support subject.  Instructors address importance of being able to find your pulse and how to count your pulse when at home without a heart monitor.  Patients get hands on experience counting their pulse with staff help and individually.   Heart Attack, Angina, and Risk Factor Modification:  -Group instruction provided by verbal instruction, video, and written materials to support subject.  Instructors address signs and symptoms of angina and heart attacks.    Also discuss risk factors for heart disease and how to make changes to improve heart health risk factors.   Functional Fitness:  -Group instruction provided by verbal instruction, demonstration, patient participation, and written materials to support subject.  Instructors address safety measures for doing things around the house.  Discuss how to get up and down off the floor, how to pick things up properly, how to safely get out of a chair without assistance, and balance training.   Meditation and Mindfulness:  -Group  instruction provided by verbal instruction, patient participation, and written materials to support subject.  Instructor addresses importance of mindfulness and meditation practice to help reduce stress and improve awareness.  Instructor also leads participants through a meditation exercise.    CARDIAC REHAB PHASE II EXERCISE from 09/25/2017 in [facility]  Date  09/11/17  Instruction Review Code  2- Demonstrated Understanding      Stretching for Flexibility and Mobility:  -Group instruction provided by verbal instruction, patient participation, and written materials to support subject.  Instructors lead participants through series of stretches that are designed  to increase flexibility thus improving mobility.  These stretches are additional exercise for major muscle groups that are typically performed during regular warm up and cool down.   Hands Only CPR:  -Group verbal, video, and participation provides a basic overview of AHA guidelines for community CPR. Role-play of emergencies allow participants the opportunity to practice calling for help and chest compression technique with discussion of AED use.   Hypertension: -Group verbal and written instruction that provides a basic overview of hypertension including the most recent diagnostic guidelines, risk factor reduction with self-care instructions and medication management.    Nutrition I class: Heart Healthy Eating:  -Group instruction provided by PowerPoint slides, verbal discussion, and written materials to support subject matter. The instructor gives an explanation and review of the Therapeutic Lifestyle Changes diet recommendations, which includes a discussion on lipid goals, dietary fat, sodium, fiber, plant stanol/sterol esters, sugar, and the components of a well-balanced, healthy diet.   CARDIAC REHAB PHASE II EXERCISE from 09/25/2017 in [place]  Date  09/25/17   Educator  RD      Nutrition II class: Lifestyle Skills:  -Group instruction provided by PowerPoint slides, verbal discussion, and written materials to support subject matter. The instructor gives an explanation and review of label reading, grocery shopping for heart health, heart healthy recipe modifications, and ways to make healthier choices when eating out.   CARDIAC REHAB PHASE II EXERCISE from 09/25/2017 in [place]  Date  09/25/17  Educator  RD      Diabetes Question & Answer:  -Group instruction provided by PowerPoint slides, verbal discussion, and written materials to support subject matter. The instructor gives an explanation and review of diabetes co-morbidities, pre- and post-prandial blood glucose goals, pre-exercise blood glucose goals, signs, symptoms, and treatment of hypoglycemia and hyperglycemia, and foot care basics.   Diabetes Blitz:  -Group instruction provided by PowerPoint slides, verbal discussion, and written materials to support subject matter. The instructor gives an explanation and review of the physiology behind type 1 and type 2 diabetes, diabetes medications and rational behind using different medications, pre- and post-prandial blood glucose recommendations and Hemoglobin A1c goals, diabetes diet, and exercise including blood glucose guidelines for exercising safely.    Portion Distortion:  -Group instruction provided by PowerPoint slides, verbal discussion, written materials, and food models to support subject matter. The instructor gives an explanation of serving size versus portion size, changes in portions sizes over the last 20 years, and what consists of a serving from each food group.   CARDIAC REHAB PHASE II EXERCISE from 09/25/2017 in [place]  Date  09/18/17  Educator  RD  Instruction Review Code  2- Demonstrated Understanding      Stress Management:  -Group instruction provided  by verbal instruction, video, and written materials to support subject matter.  Instructors review role of stress in heart disease and how to cope with stress positively.     Exercising on Your Own:  -Group instruction provided by verbal instruction, power point, and written materials to support subject.  Instructors discuss benefits of exercise, components of exercise, frequency and intensity of exercise, and end points for exercise.  Also discuss use of nitroglycerin and activating EMS.  Review options of places to exercise outside of rehab.  Review guidelines for sex with heart disease.   Cardiac Drugs I:  -Group instruction provided by verbal instruction and written materials to support subject.  Instructor  reviews cardiac drug classes: antiplatelets, anticoagulants, beta blockers, and statins.  Instructor discusses reasons, side effects, and lifestyle considerations for each drug class.   CARDIAC REHAB PHASE II EXERCISE from 09/25/2017 in [place]  Date  09/04/17  Instruction Review Code  2- Demonstrated Understanding      Cardiac Drugs II:  -Group instruction provided by verbal instruction and written materials to support subject.  Instructor reviews cardiac drug classes: angiotensin converting enzyme inhibitors (ACE-I), angiotensin II receptor blockers (ARBs), nitrates, and calcium channel blockers.  Instructor discusses reasons, side effects, and lifestyle considerations for each drug class.   Anatomy and Physiology of the Circulatory System:  Group verbal and written instruction and models provide basic cardiac anatomy and physiology, with the coronary electrical and arterial systems. Review of: AMI, Angina, Valve disease, Heart Failure, Peripheral Artery Disease, Cardiac Arrhythmia, Pacemakers, and the ICD.   Other Education:  -Group or individual verbal, written, or video instructions that support the educational goals of the cardiac rehab  program.   Holiday Eating Survival Tips:  -Group instruction provided by PowerPoint slides, verbal discussion, and written materials to support subject matter. The instructor gives patients tips, tricks, and techniques to help them not only survive but enjoy the holidays despite the onslaught of food that accompanies the holidays.   Knowledge Questionnaire Score: Knowledge Questionnaire Score - 08/20/17 1610      Knowledge Questionnaire Score   Pre Score  23/24       Core Components/Risk Factors/Patient Goals at Admission: Personal Goals and Risk Factors at Admission - 08/20/17 0903      Core Components/Risk Factors/Patient Goals on Admission    Weight Management  Weight Loss;Yes    Intervention  Weight Management: Develop a combined nutrition and exercise program designed to reach desired caloric intake, while maintaining appropriate intake of nutrient and fiber, sodium and fats, and appropriate energy expenditure required for the weight goal.;Weight Management: Provide education and appropriate resources to help participant work on and attain dietary goals.    Admit Weight  210 lb 15.7 oz (95.7 kg)    Goal Weight: Short Term  204 lb (92.5 kg)    Goal Weight: Long Term  185 lb (83.9 kg)    Expected Outcomes  Short Term: Continue to assess and modify interventions until short term weight is achieved;Long Term: Adherence to nutrition and physical activity/exercise program aimed toward attainment of established weight goal;Weight Loss: Understanding of general recommendations for a balanced deficit meal plan, which promotes 1-2 lb weight loss per week and includes a negative energy balance of [phone] kcal/d;Understanding recommendations for meals to include 15-35% energy as protein, 25-35% energy from fat, 35-60% energy from carbohydrates, less than 24m of dietary cholesterol, 20-35 gm of total fiber daily;Understanding of distribution of calorie intake throughout the day with the  consumption of 4-5 meals/snacks    Lipids  Yes    Intervention  Provide education and support for participant on nutrition & aerobic/resistive exercise along with prescribed medications to achieve LDL <751m HDL >4054m   Expected Outcomes  Short Term: Participant states understanding of desired cholesterol values and is compliant with medications prescribed. Participant is following exercise prescription and nutrition guidelines.;Long Term: Cholesterol controlled with medications as prescribed, with individualized exercise RX and with personalized nutrition plan. Value goals: LDL < 43m93mDL > 40 mg.       Core Components/Risk Factors/Patient Goals Review:  Goals and Risk Factor Review    Row Name 08/26/17 1001  09/11/17 1708 10/09/17 1021         Core Components/Risk Factors/Patient Goals Review   Personal Goals Review  Weight Management/Obesity;Hypertension  Weight Management/Obesity;Hypertension  Weight Management/Obesity;Hypertension     Review  pt with CAD RF demonstrates eagernes to participate in CR program.  pt personal goals are to feel better about his heart health.   Pt with CAD RF eager to participate in CR program.  Pt feels that he has increased his stamina and has seen a difference in the amount of time he walks at home with his wife.  Pt with CAD RF eager to participate in CR program.  Tayron continues to increase his stamina and strength with exercise.     Expected Outcomes  pt will participate in CR exercise,nutrition and lifestyle modification education opportunities.   Pt will participate in CR exercise,nutrition and lifestyle modification education opportunities.   Pt will continue to participate in CR exercise, nutrition, and lifestyle modification opportunities.         Core Components/Risk Factors/Patient Goals at Discharge (Final Review):  Goals and Risk Factor Review - 10/09/17 1021      Core Components/Risk Factors/Patient Goals Review   Personal Goals Review  Weight  Management/Obesity;Hypertension    Review  Pt with CAD RF eager to participate in CR program.  Tamel continues to increase his stamina and strength with exercise.    Expected Outcomes  Pt will continue to participate in CR exercise, nutrition, and lifestyle modification opportunities.        ITP Comments: ITP Comments    Row Name 08/20/17 [phone] 08/26/17 0959 09/11/17 1707 10/09/17 1019     ITP Comments  Dr. Fransico Him, Medical Director   pt started group exercise session. pt oriented to equipment and safety guidelines. pt tolerated light activity without difficulty. pt demonstraates eagerness to participate in CR program.  30 Day ITP. Pt off to a great start with exercise.  He is tolerating exercise well.  30 Day ITP Review. Ryleigh is continuing to do well with exercise. His vital signs are stable during exercise.       Comments: See ITP Comments

## 2017-10-11 ENCOUNTER — Encounter (HOSPITAL_COMMUNITY): Payer: 59

## 2017-10-14 ENCOUNTER — Encounter (HOSPITAL_COMMUNITY)
Admission: RE | Admit: 2017-10-14 | Discharge: 2017-10-14 | Disposition: A | Discharge status: Home / Self Care | Payer: 59 | Source: Ambulatory Visit | Attending: Cardiology | Admitting: Cardiology

## 2017-10-14 DIAGNOSIS — Z952 Presence of prosthetic heart valve: Secondary | ICD-10-CM

## 2017-10-16 ENCOUNTER — Encounter (HOSPITAL_COMMUNITY)
Admission: RE | Admit: 2017-10-16 | Discharge: 2017-10-16 | Disposition: A | Discharge status: Home / Self Care | Payer: 59 | Source: Ambulatory Visit | Attending: Cardiology | Admitting: Cardiology

## 2017-10-16 DIAGNOSIS — Z952 Presence of prosthetic heart valve: Secondary | ICD-10-CM

## 2017-10-18 ENCOUNTER — Encounter (HOSPITAL_COMMUNITY)
Admission: RE | Admit: 2017-10-18 | Discharge: 2017-10-18 | Disposition: A | Discharge status: Home / Self Care | Payer: 59 | Source: Ambulatory Visit | Attending: Cardiology | Admitting: Cardiology

## 2017-10-18 DIAGNOSIS — Z952 Presence of prosthetic heart valve: Secondary | ICD-10-CM

## 2017-10-21 ENCOUNTER — Encounter (HOSPITAL_COMMUNITY)
Admission: RE | Admit: 2017-10-21 | Discharge: 2017-10-21 | Disposition: A | Discharge status: Home / Self Care | Payer: 59 | Source: Ambulatory Visit | Attending: Cardiology | Admitting: Cardiology

## 2017-10-21 DIAGNOSIS — Z952 Presence of prosthetic heart valve: Secondary | ICD-10-CM

## 2017-10-23 ENCOUNTER — Encounter (HOSPITAL_COMMUNITY)
Admission: RE | Admit: 2017-10-23 | Discharge: 2017-10-23 | Disposition: A | Discharge status: Home / Self Care | Payer: 59 | Source: Ambulatory Visit | Attending: Cardiology | Admitting: Cardiology

## 2017-10-23 DIAGNOSIS — Z952 Presence of prosthetic heart valve: Secondary | ICD-10-CM

## 2017-10-25 ENCOUNTER — Encounter (HOSPITAL_COMMUNITY): Payer: 59

## 2017-10-28 ENCOUNTER — Encounter (HOSPITAL_COMMUNITY)
Admission: RE | Admit: 2017-10-28 | Discharge: 2017-10-28 | Disposition: A | Discharge status: Home / Self Care | Payer: 59 | Source: Ambulatory Visit | Attending: Cardiology | Admitting: Cardiology

## 2017-10-28 DIAGNOSIS — Z952 Presence of prosthetic heart valve: Secondary | ICD-10-CM | POA: Diagnosis not present

## 2017-10-28 DIAGNOSIS — Z7982 Long term (current) use of aspirin: Secondary | ICD-10-CM | POA: Insufficient documentation

## 2017-10-28 DIAGNOSIS — G4733 Obstructive sleep apnea (adult) (pediatric): Secondary | ICD-10-CM | POA: Diagnosis not present

## 2017-10-28 DIAGNOSIS — E7800 Pure hypercholesterolemia, unspecified: Secondary | ICD-10-CM | POA: Insufficient documentation

## 2017-10-28 DIAGNOSIS — F17200 Nicotine dependence, unspecified, uncomplicated: Secondary | ICD-10-CM | POA: Insufficient documentation

## 2017-10-28 DIAGNOSIS — E785 Hyperlipidemia, unspecified: Secondary | ICD-10-CM | POA: Insufficient documentation

## 2017-10-28 DIAGNOSIS — Z79899 Other long term (current) drug therapy: Secondary | ICD-10-CM | POA: Insufficient documentation

## 2017-10-30 ENCOUNTER — Encounter (HOSPITAL_COMMUNITY)
Admission: RE | Admit: 2017-10-30 | Discharge: 2017-10-30 | Disposition: A | Discharge status: Home / Self Care | Payer: 59 | Source: Ambulatory Visit | Attending: Cardiology | Admitting: Cardiology

## 2017-10-30 DIAGNOSIS — Z952 Presence of prosthetic heart valve: Secondary | ICD-10-CM

## 2017-11-01 ENCOUNTER — Encounter (HOSPITAL_COMMUNITY): Payer: 59

## 2017-11-04 ENCOUNTER — Encounter (HOSPITAL_COMMUNITY)
Admission: RE | Admit: 2017-11-04 | Discharge: 2017-11-04 | Disposition: A | Discharge status: Home / Self Care | Payer: 59 | Source: Ambulatory Visit | Attending: Cardiology | Admitting: Cardiology

## 2017-11-04 DIAGNOSIS — Z952 Presence of prosthetic heart valve: Secondary | ICD-10-CM

## 2017-11-06 ENCOUNTER — Encounter (HOSPITAL_COMMUNITY)
Admission: RE | Admit: 2017-11-06 | Discharge: 2017-11-06 | Disposition: A | Discharge status: Home / Self Care | Payer: 59 | Source: Ambulatory Visit | Attending: Cardiology | Admitting: Cardiology

## 2017-11-06 ENCOUNTER — Encounter (HOSPITAL_COMMUNITY): Payer: Self-pay

## 2017-11-06 DIAGNOSIS — G4733 Obstructive sleep apnea (adult) (pediatric): Secondary | ICD-10-CM | POA: Diagnosis not present

## 2017-11-06 DIAGNOSIS — Z952 Presence of prosthetic heart valve: Secondary | ICD-10-CM | POA: Diagnosis not present

## 2017-11-07 NOTE — Progress Notes (Signed)
Cardiac Individual Treatment Plan  Patient Details  Name: [name] Anderson MRN: [id number] Date of Birth: 1956-03-28 Referring Provider:     CARDIAC REHAB PHASE II ORIENTATION from 08/20/2017 in [facility]  Referring Provider  Kirk Ruths, MD.      Initial Encounter Date:    CARDIAC REHAB PHASE II ORIENTATION from 08/20/2017 in [facility]  Date  08/20/17      Visit Diagnosis: S/P AVR  Patient's Home Medications on Admission:  Current Outpatient Medications:  .  acetaminophen (TYLENOL) 500 MG tablet, Take 1 tablet (500 mg total) by mouth every 6 (six) hours as needed (for pain.)., Disp: 30 tablet, Rfl: 0 .  aluminum chloride (HYPERCARE) 20 % external solution, Apply 1 application topically 3 (three) times daily as needed (for sweating.)., Disp: , Rfl:  .  Ascorbic Acid (VITAMIN C) 1000 MG tablet, Take 1,000 mg by mouth daily., Disp: , Rfl:  .  aspirin EC 81 MG tablet, Take 81 mg by mouth daily., Disp: , Rfl:  .  atorvastatin (LIPITOR) 10 MG tablet, Take 1 tablet (10 mg total) by mouth daily at 6 PM., Disp: 30 tablet, Rfl: 6 .  betamethasone valerate (VALISONE) 0.1 % cream, Apply 1 application topically 2 (two) times daily as needed (for skin irritation.)., Disp: , Rfl:  .  cetirizine (ZYRTEC) 10 MG tablet, Take 10 mg by mouth daily as needed for allergies., Disp: , Rfl:  .  ECHINACEA PO, Take 760 mg by mouth daily., Disp: , Rfl:  .  fluorometholone (FML) 0.1 % ophthalmic suspension, Place 1 drop into both eyes 3 (three) times daily as needed (for irritated/itchy eyes.)., Disp: , Rfl:  .  Glucosamine-Chondroitin (COSAMIN DS PO), Take 1 tablet by mouth 2 (two) times daily., Disp: , Rfl:  .  loratadine (CLARITIN) 10 MG tablet, Take 10 mg by mouth daily as needed for allergies., Disp: , Rfl:  .  losartan (COZAAR) 50 MG tablet, Take 1 tablet (50 mg total) by mouth daily., Disp: 90 tablet, Rfl: 3 .  metoprolol tartrate  (LOPRESSOR) 25 MG tablet, Take 0.5 tablets (12.5 mg total) by mouth 2 (two) times daily., Disp: 90 tablet, Rfl: 3 .  Naphazoline-Pheniramine (OPCON-A) 0.027-0.315 % SOLN, Place 1-2 drops into both eyes 3 (three) times daily as needed (for allergy eyes.)., Disp: , Rfl:  .  NON FORMULARY, Take 2 capsules by mouth 2 (two) times daily. DoTerra (Microplex VMz) 2 Capsule twice daily, Disp: , Rfl:  .  NON FORMULARY, Take 2 capsules by mouth 2 (two) times daily. XE0 Mega "Doterra", Disp: , Rfl:  .  sildenafil (REVATIO) 20 MG tablet, Take 20 mg by mouth daily as needed (for ED). , Disp: , Rfl:  .  SUPER B COMPLEX/C PO, Take 1 tablet by mouth daily., Disp: , Rfl:  .  traMADol (ULTRAM) 50 MG tablet, Take 50 mg by mouth every 4-6 hours PRN moderate pain., Disp: 30 tablet, Rfl: 0 .  triamcinolone cream (KENALOG) 0.1 %, Apply 1 application topically daily as needed (FOR DRY SKIN/ITCHY SKIN.). Apply to area after bath (DO NOT APPLY TO FACE), Disp: , Rfl: 1  Past Medical History: Past Medical History:  Diagnosis Date  . Allergy   . Anemia   . Barrett's esophagus - short segment 03/13/2017  . Cancer ([place]) 09/11/13   testicular  . Complication of anesthesia    hard to wake  - 20 years ago  . ED (erectile dysfunction)   .  Endocarditis    [facility] 03/29/2017  . H/O aneurysm 03/05/2017  . High cholesterol   . History of basal cell carcinoma excision    02/2002   --  NOSE  &   2013  RIGHT EAR  . History of kidney stones   . Hyperlipidemia   . Mild obstructive sleep apnea    PER STUDY 01-18-2005--  NO CPAP OR MOUTH GUARD  . Moderate aortic valve insufficiency   . Multiple pulmonary nodules    PER CT--  PROBABLE  GRANULOMATOUS - patient is not aware of this  . Personal history of colonic polyps    TUBULAR ADENOMA--   . Seasonal allergies   . 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[place])   . Testicular mass    RIGHT    Tobacco Use: Social History   Tobacco Use  Smoking Status Current Some Day Smoker  . Types: Pipe   Smokeless Tobacco Former Systems developer  . Types: Chew  . Quit date: 04/26/2011  Tobacco Comment   OCCASIONAL  PIPE SMOKER  (NEVER SMOKED CIGARETTES)    Labs: Recent Review Flowsheet Data    Labs for ITP Cardiac and Pulmonary Rehab Latest Ref Rng & Units 06/05/2017 06/05/2017 06/05/2017 06/05/2017 06/06/2017   Cholestrol 0 - 200 mg/dL - - - - -   LDLCALC 0 - 99 mg/dL - - - - -   LDLDIRECT mg/dL - - - - -   HDL >40 mg/dL - - - - -   Trlycerides <150 mg/dL - - - - -   Hemoglobin A1c 4.8 - 5.6 % - - - - -   PHART 7.350 - 7.450 7.312(L) 7.300(L) 7.342(L) - -   PCO2ART 32.0 - 48.0 mmHg 43.3 39.3 37.9 - -   HCO3 20.0 - 28.0 mmol/L 21.9 19.3(L) 20.6 - -   TCO2 22 - 32 mmol/L 23 21(L) 22 21(L) 24   ACIDBASEDEF 0.0 - 2.0 mmol/L 4.0(H) 7.0(H) 5.0(H) - -   O2SAT % 98.0 99.0 97.0 - -      Capillary Blood Glucose: Lab Results  Component Value Date   GLUCAP 102 (H) 06/09/2017   GLUCAP 107 (H) 06/08/2017   GLUCAP 111 (H) 06/08/2017   GLUCAP 111 (H) 06/07/2017   GLUCAP 130 (H) 06/07/2017     Exercise Target Goals:    Exercise Program Goal: Individual exercise prescription set using results from initial 6 min walk test and THRR while considering  patient's activity barriers and safety.   Exercise Prescription Goal: Initial exercise prescription builds to 30-45 minutes a day of aerobic activity, 2-3 days per week.  Home exercise guidelines will be given to patient during program as part of exercise prescription that the participant will acknowledge.  Activity Barriers & Risk Stratification: Activity Barriers & Cardiac Risk Stratification - 08/20/17 0851      Activity Barriers & Cardiac Risk Stratification   Activity Barriers  Other (comment)    Comments  Bulging disc in back. Right hip pain with walking.    Cardiac Risk Stratification  Moderate       6 Minute Walk: 6 Minute Walk    Row Name 08/20/17 0853         6 Minute Walk   Phase  Initial     Distance  1854 feet     Walk Time  6  minutes     # of Rest Breaks  0     MPH  3.51     METS  4.26     RPE  9     VO2 Peak  14.92     Symptoms  No     Resting HR  71 bpm     Resting BP  104/70     Resting Oxygen Saturation   99 %     Exercise Oxygen Saturation  during 6 min walk  95 %     Max Ex. HR  93 bpm     Max Ex. BP  136/88     2 Minute Post BP  106/84        Oxygen Initial Assessment:   Oxygen Re-Evaluation:   Oxygen Discharge (Final Oxygen Re-Evaluation):   Initial Exercise Prescription: Initial Exercise Prescription - 08/20/17 0900      Date of Initial Exercise RX and Referring Provider   Date  08/20/17    Referring Provider  Kirk Ruths, MD.      Treadmill   MPH  2.8    Grade  0    Minutes  10    METs  3.14      Bike   Level  1.5    Minutes  3.98    METs  10      NuStep   Level  4    SPM  85    Minutes  10    METs  3      Prescription Details   Frequency (times per week)  3    Duration  Progress to 45 minutes of aerobic exercise without signs/symptoms of physical distress      Intensity   THRR 40-80% of Max Heartrate  64-127    Ratings of Perceived Exertion  11-13    Perceived Dyspnea  0-4      Progression   Progression  Continue to progress workloads to maintain intensity without signs/symptoms of physical distress.      Resistance Training   Training Prescription  Yes    Weight  4lbs    Reps  10-15       Perform Capillary Blood Glucose checks as needed.  Exercise Prescription Changes: Exercise Prescription Changes    Row Name 08/26/17 1412 09/04/17 0800 09/11/17 1000 09/30/17 1048 10/09/17 1512     Response to Exercise   Blood Pressure (Admit)  106/80  118/80  112/68  114/90  100/80   Blood Pressure (Exercise)  130/80  130/80  128/84  140/90  140/76   Blood Pressure (Exit)  108/82  114/68  112/68  120/84  110/80   Heart Rate (Admit)  84 bpm  80 bpm  82 bpm  77 bpm  96 bpm   Heart Rate (Exercise)  111 bpm  109 bpm  106 bpm  106 bpm  110 bpm   Heart Rate  (Exit)  84 bpm  76 bpm  82 bpm  77 bpm  96 bpm   Rating of Perceived Exertion (Exercise)  '12  12  12  12  12   ' Perceived Dyspnea (Exercise)  0  0  0  0  0   Symptoms  None  None   None  None  None   Comments  Pt oriented to exercise equipment  -  -  -  -   Duration  Progress to 30 minutes of  aerobic without signs/symptoms of physical distress  Continue with 30 min of aerobic exercise without signs/symptoms of physical distress.  Progress to 45 minutes of aerobic exercise without signs/symptoms of physical distress  Progress to 45 minutes of aerobic exercise without  signs/symptoms of physical distress  Continue with 45 min of aerobic exercise without signs/symptoms of physical distress.   Intensity  THRR New  THRR unchanged  THRR unchanged  THRR unchanged  THRR unchanged     Progression   Progression  Continue to progress workloads to maintain intensity without signs/symptoms of physical distress.  Continue to progress workloads to maintain intensity without signs/symptoms of physical distress.  Continue to progress workloads to maintain intensity without signs/symptoms of physical distress.  Continue to progress workloads to maintain intensity without signs/symptoms of physical distress.  Continue to progress workloads to maintain intensity without signs/symptoms of physical distress.   Average METs  3.6  3.5  3.5  4.36  4.59     Resistance Training   Training Prescription  Yes  No  No  Yes  No   Weight  4lbs  -  4lbs  5lbs  -   Reps  10-15  -  -  10-15  -   Time  10 Minutes  -  -  10 Minutes  -     Interval Training   Interval Training  No  No  -  No  No     Treadmill   MPH  2.8  2.8  2.'8  3  3   ' Grade  0  10  0  3  3   Minutes  '10  10  10  10  10   ' METs  3.14  3.14  3.14  4.54  4.54     Bike   Level  1.5  1.5  1.5  1.5  1.8   Minutes  '10  10  10  10  10   ' METs  3.96  3.99  3.96  3.95  4.52     NuStep   Level  '4  4  4  5  5   ' SPM  95  95  95  95  95   Minutes  '10  10  10  10  10    ' METs  3.7  3.3  3.4  4.6  4.7     Home Exercise Plan   Plans to continue exercise at  -  -  Home (comment) Walking   Home (comment)  Home (comment) Walking   Frequency  -  -  Add 3 additional days to program exercise sessions.  Add 2 additional days to program exercise sessions.  Add 2 additional days to program exercise sessions.   Initial Home Exercises Provided  -  -  09/11/17  09/11/17  09/11/17   Row Name 10/21/17 1600 11/06/17 1407           Response to Exercise   Blood Pressure (Admit)  110/78  112/68      Blood Pressure (Exercise)  132/78  124/76      Blood Pressure (Exit)  104/64  108/70      Heart Rate (Admit)  88 bpm  84 bpm      Heart Rate (Exercise)  144 bpm  117 bpm      Heart Rate (Exit)  88 bpm  84 bpm      Rating of Perceived Exertion (Exercise)  11  11      Perceived Dyspnea (Exercise)  0  0      Symptoms  None  None       Duration  Progress to 45 minutes of aerobic exercise without signs/symptoms of physical distress  Continue with 45 min of  aerobic exercise without signs/symptoms of physical distress.      Intensity  THRR unchanged  THRR unchanged        Progression   Progression  Continue to progress workloads to maintain intensity without signs/symptoms of physical distress.  Continue to progress workloads to maintain intensity without signs/symptoms of physical distress.      Average METs  4.75  4.83        Resistance Training   Training Prescription  Yes  No      Weight  5lbs  -      Reps  10-15  -      Time  10 Minutes  -        Interval Training   Interval Training  No  -        Treadmill   MPH  3  3.3      Grade  3  3      Minutes  10  10      METs  4.54  4.89        Bike   Level  1.8  1.8      Minutes  10  10      METs  4.52  4.51        NuStep   Level  5  5      SPM  95  110      Minutes  10  10      METs  5.2  5.1        Home Exercise Plan   Plans to continue exercise at  Home (comment) Walking  Home (comment) Walking        Frequency  Add 2 additional days to program exercise sessions.  Add 2 additional days to program exercise sessions.      Initial Home Exercises Provided  09/11/17  09/11/17         Exercise Comments: Exercise Comments    Row Name 08/26/17 1413 09/11/17 1006 10/10/17 1514 11/07/17 1409     Exercise Comments  Pt's first day of exercise. Pt is off to a great start with exercise and responded well to exericse workloads. Will continue to monitor and progress pt.   Reviewed METs, Goals and Home Exercise Program. Pt is currently walking daily. Will continue to monitor and follow up with pt regarding exercise at home.   Reviewed METs and Goals. Pt is responding well to exercise prescription. Will continue to monitor and progress pt as tolerated.   Reviewed METs and Goals. Pt is responding well to workload increases. Pt is continuing to exercise at home daily. Will continue to monitor and progress pt as tolerated.        Exercise Goals and Review: Exercise Goals    Row Name 08/20/17 0856             Exercise Goals   Increase Physical Activity  Yes       Intervention  Provide advice, education, support and counseling about physical activity/exercise needs.;Develop an individualized exercise prescription for aerobic and resistive training based on initial evaluation findings, risk stratification, comorbidities and participant's personal goals.       Expected Outcomes  Short Term: Attend rehab on a regular basis to increase amount of physical activity.;Long Term: Exercising regularly at least 3-5 days a week.;Long Term: Add in home exercise to make exercise part of routine and to increase amount of physical activity.       Increase Strength and Stamina  Yes       Intervention  Provide advice, education, support and counseling about physical activity/exercise needs.;Develop an individualized exercise prescription for aerobic and resistive training based on initial evaluation findings, risk  stratification, comorbidities and participant's personal goals.       Expected Outcomes  Short Term: Increase workloads from initial exercise prescription for resistance, speed, and METs.;Short Term: Perform resistance training exercises routinely during rehab and add in resistance training at home;Long Term: Improve cardiorespiratory fitness, muscular endurance and strength as measured by increased METs and functional capacity (6MWT)       Able to understand and use rate of perceived exertion (RPE) scale  Yes       Intervention  Provide education and explanation on how to use RPE scale       Expected Outcomes  Short Term: Able to use RPE daily in rehab to express subjective intensity level;Long Term:  Able to use RPE to guide intensity level when exercising independently       Knowledge and understanding of Target Heart Rate Range (THRR)  Yes       Intervention  Provide education and explanation of THRR including how the numbers were predicted and where they are located for reference       Expected Outcomes  Short Term: Able to state/look up THRR;Long Term: Able to use THRR to govern intensity when exercising independently;Short Term: Able to use daily as guideline for intensity in rehab       Able to check pulse independently  Yes       Intervention  Provide education and demonstration on how to check pulse in carotid and radial arteries.;Review the importance of being able to check your own pulse for safety during independent exercise       Expected Outcomes  Short Term: Able to explain why pulse checking is important during independent exercise;Long Term: Able to check pulse independently and accurately       Understanding of Exercise Prescription  Yes       Intervention  Provide education, explanation, and written materials on patient's individual exercise prescription       Expected Outcomes  Short Term: Able to explain program exercise prescription;Long Term: Able to explain home exercise  prescription to exercise independently          Exercise Goals Re-Evaluation : Exercise Goals Re-Evaluation    [place] Name 09/11/17 1008 10/10/17 1517 11/07/17 1414         Exercise Goal Re-Evaluation   Exercise Goals Review  Increase Physical Activity;Increase Strength and Stamina;Able to understand and use rate of perceived exertion (RPE) scale;Knowledge and understanding of Target Heart Rate Range (THRR);Able to check pulse independently;Understanding of Exercise Prescription  Increase Physical Activity;Understanding of Exercise Prescription  Increase Physical Activity;Understanding of Exercise Prescription     Comments  Reviewed HEP with pt. Also reviewed RPE Scale, THRR, weather conditions, endpoints of exercise, NTG use, warm up and cool down. Pt is continuing to respond to exercise workloads.   Pt is continuing to respond well to exercise prescription. Will work with pt to increase workloads. Will continue to monitor pt's progress.   Pt is currently walking daily, 3-5 miles. Pt is now walking 3.3 mph on treadmill. Will work with pt to increase level on Airdyne to 2.0.      Expected Outcomes  Pt will contine to walk daily for 30-50 minutes. Pt walks at local parks. Pt will continue to improve cardiorespiratory fitness. Will continue progress pt's workloads as  tolerated.  Pt will continue to walk at home for exercise and build strength. Will continue to work with pt to increase MET level.   Pt will continue to walk daily. Pt will start to focus on timing how long it takes for hilm to walk 3-5 miles. Will continue to monitor pt's progress.          Discharge Exercise Prescription (Final Exercise Prescription Changes): Exercise Prescription Changes - 11/06/17 1407      Response to Exercise   Blood Pressure (Admit)  112/68    Blood Pressure (Exercise)  124/76    Blood Pressure (Exit)  108/70    Heart Rate (Admit)  84 bpm    Heart Rate (Exercise)  117 bpm    Heart Rate (Exit)  84 bpm     Rating of Perceived Exertion (Exercise)  11    Perceived Dyspnea (Exercise)  0    Symptoms  None     Duration  Continue with 45 min of aerobic exercise without signs/symptoms of physical distress.    Intensity  THRR unchanged      Progression   Progression  Continue to progress workloads to maintain intensity without signs/symptoms of physical distress.    Average METs  4.83      Resistance Training   Training Prescription  No      Treadmill   MPH  3.3    Grade  3    Minutes  10    METs  4.89      Bike   Level  1.8    Minutes  10    METs  4.51      NuStep   Level  5    SPM  110    Minutes  10    METs  5.1      Home Exercise Plan   Plans to continue exercise at  Home (comment) Walking     Frequency  Add 2 additional days to program exercise sessions.    Initial Home Exercises Provided  09/11/17       Nutrition:  Target Goals: Understanding of nutrition guidelines, daily intake of sodium <1511m, cholesterol <2064m calories 30% from fat and 7% or less from saturated fats, daily to have 5 or more servings of fruits and vegetables.  Biometrics: Pre Biometrics - 08/20/17 0856      Pre Biometrics   Height  '5\' 11"'  (1.803 m)    Weight  210 lb 15.7 oz (95.7 kg)    Waist Circumference  40.5 inches    Hip Circumference  41.5 inches    Waist to Hip Ratio  0.98 %    BMI (Calculated)  29.44    Triceps Skinfold  25 mm    % Body Fat  30 %    Grip Strength  51 kg    Flexibility  8 in Knees slightly bent    Single Leg Stand  11.53 seconds        Nutrition Therapy Plan and Nutrition Goals: Nutrition Therapy & Goals - 08/26/17 0925      Nutrition Therapy   Diet  Heart Healthy      Personal Nutrition Goals   Nutrition Goal  Pt to identify and limit food sources of saturated fat, trans fat, and sodium    Personal Goal #2  Pt to identify food quantities necessary to achieve weight loss of 6-24 lb (2.7-10.9 kg) at graduation from cardiac rehab. Long-term wt loss goal wt of  185 lb desired.  Intervention Plan   Intervention  Prescribe, educate and counsel regarding individualized specific dietary modifications aiming towards targeted core components such as weight, hypertension, lipid management, diabetes, heart failure and other comorbidities.    Expected Outcomes  Short Term Goal: Understand basic principles of dietary content, such as calories, fat, sodium, cholesterol and nutrients.;Long Term Goal: Adherence to prescribed nutrition plan.       Nutrition Assessments: Nutrition Assessments - 08/26/17 0925      MEDFICTS Scores   Pre Score  48       Nutrition Goals Re-Evaluation:   Nutrition Goals Re-Evaluation:   Nutrition Goals Discharge (Final Nutrition Goals Re-Evaluation):   Psychosocial: Target Goals: Acknowledge presence or absence of significant depression and/or stress, maximize coping skills, provide positive support system. Participant is able to verbalize types and ability to use techniques and skills needed for reducing stress and depression.  Initial Review & Psychosocial Screening: Initial Psych Review & Screening - 08/20/17 0810      Initial Review   Current issues with  None Identified      Family Dynamics   Good Support System?  Yes wife/children/friends       Barriers   Psychosocial barriers to participate in program  There are no identifiable barriers or psychosocial needs.      Screening Interventions   Interventions  Encouraged to exercise       Quality of Life Scores: Quality of Life - 08/20/17 0906      Quality of Life Scores   Health/Function Pre  27.82 %    Socioeconomic Pre  27 %    Psych/Spiritual Pre  28.57 %    Family Pre  30 %    GLOBAL Pre  28.14 %      Scores of 19 and below usually indicate a poorer quality of life in these areas.  A difference of  2-3 points is a clinically meaningful difference.  A difference of 2-3 points in the total score of the Quality of Life Index has been associated  with significant improvement in overall quality of life, self-image, physical symptoms, and general health in studies assessing change in quality of life.  PHQ-9: Recent Review Flowsheet Data    Depression screen [facility] 2/9 08/26/2017 02/11/2013   Decreased Interest 0 0   Down, Depressed, Hopeless 0 0   PHQ - 2 Score 0 0     Interpretation of Total Score  Total Score Depression Severity:  1-4 = Minimal depression, 5-9 = Mild depression, 10-14 = Moderate depression, 15-19 = Moderately severe depression, 20-27 = Severe depression   Psychosocial Evaluation and Intervention: Psychosocial Evaluation - 08/26/17 1002      Psychosocial Evaluation & Interventions   Interventions  Encouraged to exercise with the program and follow exercise prescription    Comments  no psychosocial needs identified, no interventions necessary.  pt enjoys spending time with his grandchildren, ages 64,4 and 2.      Expected Outcomes  pt will exhibit positive outlook with good coping skills.     Continue Psychosocial Services   No Follow up required       Psychosocial Re-Evaluation: Psychosocial Re-Evaluation    [place] Name 09/11/17 1709 10/09/17 1023 11/06/17 1034         Psychosocial Re-Evaluation   Current issues with  None Identified  None Identified  None Identified     Comments  No psychosocial needs identified. No intervention necessary.  No psychosocial needs identified. No intervention necessary.  No psychosocial needs  identified. No intervention necessary.     Expected Outcomes  Pt will continue to have a positive outlook utilizing good coping skills.   Pt will continue to have a positive outlook utilizing good coping skills.   Pt will continue to have a positive outlook utilizing good coping skills.      Interventions  Encouraged to attend Cardiac Rehabilitation for the exercise  Encouraged to attend Cardiac Rehabilitation for the exercise  Encouraged to attend Cardiac Rehabilitation for the exercise      Continue Psychosocial Services   No Follow up required  No Follow up required  No Follow up required        Psychosocial Discharge (Final Psychosocial Re-Evaluation): Psychosocial Re-Evaluation - 11/06/17 1034      Psychosocial Re-Evaluation   Current issues with  None Identified    Comments  No psychosocial needs identified. No intervention necessary.    Expected Outcomes  Pt will continue to have a positive outlook utilizing good coping skills.     Interventions  Encouraged to attend Cardiac Rehabilitation for the exercise    Continue Psychosocial Services   No Follow up required       Vocational Rehabilitation: Provide vocational rehab assistance to qualifying candidates.   Vocational Rehab Evaluation & Intervention: Vocational Rehab - 08/20/17 0809      Initial Vocational Rehab Evaluation & Intervention   Assessment shows need for Vocational Rehabilitation  No       Education: Education Goals: Education classes will be provided on a weekly basis, covering required topics. Participant will state understanding/return demonstration of topics presented.  Learning Barriers/Preferences: Learning Barriers/Preferences - 08/20/17 0849      Learning Barriers/Preferences   Learning Barriers  None    Learning Preferences  None       Education Topics: Count Your Pulse:  -Group instruction provided by verbal instruction, demonstration, patient participation and written materials to support subject.  Instructors address importance of being able to find your pulse and how to count your pulse when at home without a heart monitor.  Patients get hands on experience counting their pulse with staff help and individually.   Heart Attack, Angina, and Risk Factor Modification:  -Group instruction provided by verbal instruction, video, and written materials to support subject.  Instructors address signs and symptoms of angina and heart attacks.    Also discuss risk factors for heart disease  and how to make changes to improve heart health risk factors.   Functional Fitness:  -Group instruction provided by verbal instruction, demonstration, patient participation, and written materials to support subject.  Instructors address safety measures for doing things around the house.  Discuss how to get up and down off the floor, how to pick things up properly, how to safely get out of a chair without assistance, and balance training.   Meditation and Mindfulness:  -Group instruction provided by verbal instruction, patient participation, and written materials to support subject.  Instructor addresses importance of mindfulness and meditation practice to help reduce stress and improve awareness.  Instructor also leads participants through a meditation exercise.    CARDIAC REHAB PHASE II EXERCISE from 09/25/2017 in [place]  Date  09/11/17  Instruction Review Code  2- Demonstrated Understanding      Stretching for Flexibility and Mobility:  -Group instruction provided by verbal instruction, patient participation, and written materials to support subject.  Instructors lead participants through series of stretches that are designed to increase flexibility thus improving mobility.  These stretches are additional exercise for major muscle groups that are typically performed during regular warm up and cool down.   Hands Only CPR:  -Group verbal, video, and participation provides a basic overview of AHA guidelines for community CPR. Role-play of emergencies allow participants the opportunity to practice calling for help and chest compression technique with discussion of AED use.   Hypertension: -Group verbal and written instruction that provides a basic overview of hypertension including the most recent diagnostic guidelines, risk factor reduction with self-care instructions and medication management.    Nutrition I class: Heart Healthy Eating:  -Group  instruction provided by PowerPoint slides, verbal discussion, and written materials to support subject matter. The instructor gives an explanation and review of the Therapeutic Lifestyle Changes diet recommendations, which includes a discussion on lipid goals, dietary fat, sodium, fiber, plant stanol/sterol esters, sugar, and the components of a well-balanced, healthy diet.   CARDIAC REHAB PHASE II EXERCISE from 09/25/2017 in [place]  Date  09/25/17  Educator  RD      Nutrition II class: Lifestyle Skills:  -Group instruction provided by PowerPoint slides, verbal discussion, and written materials to support subject matter. The instructor gives an explanation and review of label reading, grocery shopping for heart health, heart healthy recipe modifications, and ways to make healthier choices when eating out.   CARDIAC REHAB PHASE II EXERCISE from 09/25/2017 in [place]  Date  09/25/17  Educator  RD      Diabetes Question & Answer:  -Group instruction provided by PowerPoint slides, verbal discussion, and written materials to support subject matter. The instructor gives an explanation and review of diabetes co-morbidities, pre- and post-prandial blood glucose goals, pre-exercise blood glucose goals, signs, symptoms, and treatment of hypoglycemia and hyperglycemia, and foot care basics.   Diabetes Blitz:  -Group instruction provided by PowerPoint slides, verbal discussion, and written materials to support subject matter. The instructor gives an explanation and review of the physiology behind type 1 and type 2 diabetes, diabetes medications and rational behind using different medications, pre- and post-prandial blood glucose recommendations and Hemoglobin A1c goals, diabetes diet, and exercise including blood glucose guidelines for exercising safely.    Portion Distortion:  -Group instruction provided by PowerPoint slides, verbal  discussion, written materials, and food models to support subject matter. The instructor gives an explanation of serving size versus portion size, changes in portions sizes over the last 20 years, and what consists of a serving from each food group.   CARDIAC REHAB PHASE II EXERCISE from 09/25/2017 in [place]  Date  09/18/17  Educator  RD  Instruction Review Code  2- Demonstrated Understanding      Stress Management:  -Group instruction provided by verbal instruction, video, and written materials to support subject matter.  Instructors review role of stress in heart disease and how to cope with stress positively.     Exercising on Your Own:  -Group instruction provided by verbal instruction, power point, and written materials to support subject.  Instructors discuss benefits of exercise, components of exercise, frequency and intensity of exercise, and end points for exercise.  Also discuss use of nitroglycerin and activating EMS.  Review options of places to exercise outside of rehab.  Review guidelines for sex with heart disease.   Cardiac Drugs I:  -Group instruction provided by verbal instruction and written materials to support subject.  Instructor reviews cardiac drug classes: antiplatelets, anticoagulants, beta  blockers, and statins.  Instructor discusses reasons, side effects, and lifestyle considerations for each drug class.   CARDIAC REHAB PHASE II EXERCISE from 09/25/2017 in [place]  Date  09/04/17  Instruction Review Code  2- Demonstrated Understanding      Cardiac Drugs II:  -Group instruction provided by verbal instruction and written materials to support subject.  Instructor reviews cardiac drug classes: angiotensin converting enzyme inhibitors (ACE-I), angiotensin II receptor blockers (ARBs), nitrates, and calcium channel blockers.  Instructor discusses reasons, side effects, and lifestyle considerations for  each drug class.   Anatomy and Physiology of the Circulatory System:  Group verbal and written instruction and models provide basic cardiac anatomy and physiology, with the coronary electrical and arterial systems. Review of: AMI, Angina, Valve disease, Heart Failure, Peripheral Artery Disease, Cardiac Arrhythmia, Pacemakers, and the ICD.   Other Education:  -Group or individual verbal, written, or video instructions that support the educational goals of the cardiac rehab program.   Holiday Eating Survival Tips:  -Group instruction provided by PowerPoint slides, verbal discussion, and written materials to support subject matter. The instructor gives patients tips, tricks, and techniques to help them not only survive but enjoy the holidays despite the onslaught of food that accompanies the holidays.   Knowledge Questionnaire Score: Knowledge Questionnaire Score - 08/20/17 1638      Knowledge Questionnaire Score   Pre Score  23/24       Core Components/Risk Factors/Patient Goals at Admission: Personal Goals and Risk Factors at Admission - 08/20/17 0903      Core Components/Risk Factors/Patient Goals on Admission    Weight Management  Weight Loss;Yes    Intervention  Weight Management: Develop a combined nutrition and exercise program designed to reach desired caloric intake, while maintaining appropriate intake of nutrient and fiber, sodium and fats, and appropriate energy expenditure required for the weight goal.;Weight Management: Provide education and appropriate resources to help participant work on and attain dietary goals.    Admit Weight  210 lb 15.7 oz (95.7 kg)    Goal Weight: Short Term  204 lb (92.5 kg)    Goal Weight: Long Term  185 lb (83.9 kg)    Expected Outcomes  Short Term: Continue to assess and modify interventions until short term weight is achieved;Long Term: Adherence to nutrition and physical activity/exercise program aimed toward attainment of established weight  goal;Weight Loss: Understanding of general recommendations for a balanced deficit meal plan, which promotes 1-2 lb weight loss per week and includes a negative energy balance of [phone] kcal/d;Understanding recommendations for meals to include 15-35% energy as protein, 25-35% energy from fat, 35-60% energy from carbohydrates, less than 234m of dietary cholesterol, 20-35 gm of total fiber daily;Understanding of distribution of calorie intake throughout the day with the consumption of 4-5 meals/snacks    Lipids  Yes    Intervention  Provide education and support for participant on nutrition & aerobic/resistive exercise along with prescribed medications to achieve LDL <769m HDL >4054m   Expected Outcomes  Short Term: Participant states understanding of desired cholesterol values and is compliant with medications prescribed. Participant is following exercise prescription and nutrition guidelines.;Long Term: Cholesterol controlled with medications as prescribed, with individualized exercise RX and with personalized nutrition plan. Value goals: LDL < 74m7mDL > 40 mg.       Core Components/Risk Factors/Patient Goals Review:  Goals and Risk Factor Review    Row Name 08/26/17 1001 09/11/17 1708 10/09/17 1021 11/06/17 1034  Core Components/Risk Factors/Patient Goals Review   Personal Goals Review  Weight Management/Obesity;Hypertension  Weight Management/Obesity;Hypertension  Weight Management/Obesity;Hypertension  Weight Management/Obesity;Hypertension    Review  pt with CAD RF demonstrates eagernes to participate in CR program.  pt personal goals are to feel better about his heart health.   Pt with CAD RF eager to participate in CR program.  Pt feels that he has increased his stamina and has seen a difference in the amount of time he walks at home with his wife.  Pt with CAD RF eager to participate in CR program.  Malaquias continues to increase his stamina and strength with exercise.  Pt with CAD RF  eager to participate in CR program.  Kamsiyochukwu continues to increase his strength with exercise. He enjoys participating in CR exercise.    Expected Outcomes  pt will participate in CR exercise,nutrition and lifestyle modification education opportunities.   Pt will participate in CR exercise,nutrition and lifestyle modification education opportunities.   Pt will continue to participate in CR exercise, nutrition, and lifestyle modification opportunities.   Pt will continue to participate in CR exercise, nutrition, and lifestyle modification opportunities.        Core Components/Risk Factors/Patient Goals at Discharge (Final Review):  Goals and Risk Factor Review - 11/06/17 1034      Core Components/Risk Factors/Patient Goals Review   Personal Goals Review  Weight Management/Obesity;Hypertension    Review  Pt with CAD RF eager to participate in CR program.  Babacar continues to increase his strength with exercise. He enjoys participating in CR exercise.    Expected Outcomes  Pt will continue to participate in CR exercise, nutrition, and lifestyle modification opportunities.        ITP Comments: ITP Comments    Row Name 08/20/17 0808 08/26/17 0959 09/11/17 1707 10/09/17 1019 11/06/17 1027   ITP Comments  Dr. Fransico Him, Medical Director   pt started group exercise session. pt oriented to equipment and safety guidelines. pt tolerated light activity without difficulty. pt demonstraates eagerness to participate in CR program.  30 Day ITP. Pt off to a great start with exercise.  He is tolerating exercise well.  30 Day ITP Review. Froilan is continuing to do well with exercise. His vital signs are stable during exercise.  30 Day ITP Review. Andreu is continuing to do well with exercise. He is enjoying exercise.  Initially, Nicco did not think he would.  He is pleased that he finds the CR Program enjoyable.      Comments: See ITP Comments

## 2017-11-08 ENCOUNTER — Encounter (HOSPITAL_COMMUNITY): Admission: RE | Admit: 2017-11-08 | Payer: 59 | Source: Ambulatory Visit

## 2017-11-11 ENCOUNTER — Encounter (HOSPITAL_COMMUNITY)
Admission: RE | Admit: 2017-11-11 | Discharge: 2017-11-11 | Disposition: A | Discharge status: Home / Self Care | Payer: 59 | Source: Ambulatory Visit | Attending: Cardiology | Admitting: Cardiology

## 2017-11-11 DIAGNOSIS — Z952 Presence of prosthetic heart valve: Secondary | ICD-10-CM

## 2017-11-13 ENCOUNTER — Encounter (HOSPITAL_COMMUNITY)
Admission: RE | Admit: 2017-11-13 | Discharge: 2017-11-13 | Disposition: A | Discharge status: Home / Self Care | Payer: 59 | Source: Ambulatory Visit | Attending: Cardiology | Admitting: Cardiology

## 2017-11-13 DIAGNOSIS — Z952 Presence of prosthetic heart valve: Secondary | ICD-10-CM | POA: Diagnosis not present

## 2017-11-14 DIAGNOSIS — H01021 Squamous blepharitis right upper eyelid: Secondary | ICD-10-CM | POA: Diagnosis not present

## 2017-11-14 DIAGNOSIS — H2513 Age-related nuclear cataract, bilateral: Secondary | ICD-10-CM | POA: Diagnosis not present

## 2017-11-14 DIAGNOSIS — H10413 Chronic giant papillary conjunctivitis, bilateral: Secondary | ICD-10-CM | POA: Diagnosis not present

## 2017-11-15 ENCOUNTER — Encounter (HOSPITAL_COMMUNITY): Payer: 59

## 2017-11-18 ENCOUNTER — Encounter (HOSPITAL_COMMUNITY)
Admission: RE | Admit: 2017-11-18 | Discharge: 2017-11-18 | Disposition: A | Discharge status: Home / Self Care | Payer: 59 | Source: Ambulatory Visit | Attending: Cardiology | Admitting: Cardiology

## 2017-11-18 DIAGNOSIS — Z952 Presence of prosthetic heart valve: Secondary | ICD-10-CM | POA: Diagnosis not present

## 2017-11-20 ENCOUNTER — Encounter (HOSPITAL_COMMUNITY)
Admission: RE | Admit: 2017-11-20 | Discharge: 2017-11-20 | Disposition: A | Discharge status: Home / Self Care | Payer: 59 | Source: Ambulatory Visit | Attending: Cardiology | Admitting: Cardiology

## 2017-11-20 DIAGNOSIS — Z952 Presence of prosthetic heart valve: Secondary | ICD-10-CM

## 2017-11-22 ENCOUNTER — Encounter (HOSPITAL_COMMUNITY): Payer: 59

## 2017-11-25 ENCOUNTER — Encounter (HOSPITAL_COMMUNITY)
Admission: RE | Admit: 2017-11-25 | Discharge: 2017-11-25 | Disposition: A | Discharge status: Home / Self Care | Payer: 59 | Source: Ambulatory Visit | Attending: Cardiology | Admitting: Cardiology

## 2017-11-25 DIAGNOSIS — Z952 Presence of prosthetic heart valve: Secondary | ICD-10-CM | POA: Diagnosis not present

## 2017-11-27 ENCOUNTER — Encounter (HOSPITAL_COMMUNITY): Payer: Self-pay

## 2017-11-27 ENCOUNTER — Encounter (HOSPITAL_COMMUNITY)
Admission: RE | Admit: 2017-11-27 | Discharge: 2017-11-27 | Disposition: A | Discharge status: Home / Self Care | Payer: 59 | Source: Ambulatory Visit | Attending: Cardiology | Admitting: Cardiology

## 2017-11-27 VITALS — Ht 71.0 in | Wt 213.4 lb

## 2017-11-27 DIAGNOSIS — Z952 Presence of prosthetic heart valve: Secondary | ICD-10-CM

## 2017-11-27 DIAGNOSIS — G4733 Obstructive sleep apnea (adult) (pediatric): Secondary | ICD-10-CM | POA: Diagnosis not present

## 2017-12-05 NOTE — Progress Notes (Signed)
Discharge Progress Report  Patient Details  Name: [name] Anderson MRN: [id number] Date of Birth: 04/09/1956 Referring Provider:     [place] from 08/20/2017 in [place]  Referring Provider  Kirk Ruths, MD.       Number of Visits: 28  Reason for Discharge:  Patient reached a stable level of exercise. Patient independent in their exercise. Patient has met program and personal goals.  Smoking History:  Social History   Tobacco Use  Smoking Status Current Some Day Smoker  . Types: Pipe  Smokeless Tobacco Former Systems developer  . Types: Chew  . Quit date: 04/26/2011  Tobacco Comment   OCCASIONAL  PIPE SMOKER  (NEVER SMOKED CIGARETTES)    Diagnosis:  S/P AVR  ADL UCSD:   Initial Exercise Prescription:   Discharge Exercise Prescription (Final Exercise Prescription Changes): Exercise Prescription Changes - 11/27/17 1622      Response to Exercise   Blood Pressure (Admit)  102/70    Blood Pressure (Exercise)  150/76    Blood Pressure (Exit)  106/70    Heart Rate (Admit)  97 bpm    Heart Rate (Exercise)  121 bpm    Heart Rate (Exit)  95 bpm    Rating of Perceived Exertion (Exercise)  11    Perceived Dyspnea (Exercise)  0    Symptoms  None    Comments  Pt graduated from Cardiac Rehab    Duration  Continue with 45 min of aerobic exercise without signs/symptoms of physical distress.    Intensity  THRR unchanged      Progression   Progression  Continue to progress workloads to maintain intensity without signs/symptoms of physical distress.    Average METs  5.5      Resistance Training   Training Prescription  Yes    Weight  5lbs    Reps  10-15    Time  10 Minutes      Interval Training   Interval Training  No      Treadmill   MPH  3.3    Grade  3    Minutes  10    METs  4.89      NuStep   Level  5    SPM  110    Minutes  10    METs  5.2      Rower   Level  3    Watts  63    Minutes  10    METs   6.5      Home Exercise Plan   Plans to continue exercise at  Home (comment)    Frequency  Add 2 additional days to program exercise sessions.    Initial Home Exercises Provided  09/11/17       Functional Capacity: 6 Minute Walk    Row Name 12/02/17 1621         6 Minute Walk   Phase  Discharge     Distance  1921 feet     Distance % Change  3.61 %     Distance Feet Change  67 ft     Walk Time  6 minutes     # of Rest Breaks  0     MPH  3.6     METS  4.22     RPE  10     Perceived Dyspnea   0     VO2 Peak  14.78     Symptoms  No  Resting HR  83 bpm     Resting BP  108/82     Max Ex. HR  86 bpm     Max Ex. BP  126/80     2 Minute Post BP  106/60        Psychological, QOL, Others - Outcomes: PHQ 2/9: Depression screen [facility] 2/9 11/27/2017 08/26/2017 02/11/2013  Decreased Interest 0 0 0  Down, Depressed, Hopeless 0 0 0  PHQ - 2 Score 0 0 0    Quality of Life: Quality of Life - 12/02/17 1621      Quality of Life Scores   Health/Function Pre  27.82 %    Health/Function Post  27.4 %    Health/Function % Change  -1.51 %    Socioeconomic Pre  27 %    Socioeconomic Post  24.5 %    Socioeconomic % Change   -9.26 %    Psych/Spiritual Pre  28.57 %    Psych/Spiritual Post  26.36 %    Psych/Spiritual % Change  -7.74 %    Family Pre  30 %    Family Post  27.6 %    Family % Change  -8 %    GLOBAL Pre  28.14 %    GLOBAL Post  26.62 %    GLOBAL % Change  -5.4 %       Personal Goals: Goals established at orientation with interventions provided to work toward goal.    Personal Goals Discharge: Goals and Risk Factor Review    Row Name 11/27/17 1017             Core Components/Risk Factors/Patient Goals Review   Personal Goals Review  Weight Management/Obesity;Hypertension       Review  [name] Anderson completed CR Program with 28 sessions.  [name] Anderson enjoyed participating in the program.        Expected Outcomes  Pt will continue to participate in exercise, nutrition, and  lifestyle modification opportunities.  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           Exercise Goals and Review:   Nutrition & Weight - Outcomes:  Post Biometrics - 12/02/17 1620       Post  Biometrics   Height  '5\' 11"'  (1.803 m)    Weight  96.8 kg    Waist Circumference  42 inches    Hip Circumference  41 inches    Waist to Hip Ratio  1.02 %    BMI (Calculated)  29.78    Triceps Skinfold  24 mm    % Body Fat  30.6 %    Grip Strength  59 kg    Flexibility  11 in    Single Leg Stand  30 seconds       Nutrition:   Nutrition Discharge: Nutrition Assessments - 11/27/17 1007      MEDFICTS Scores   Pre Score  48    Post Score  36    Score Difference  -12       Education Questionnaire Score: Knowledge Questionnaire Score - 12/02/17 1425      Knowledge Questionnaire Score   Pre Score  23/24    Post Score  24/24       Goals reviewed with patient; copy given to patient.

## 2017-12-11 DIAGNOSIS — G4733 Obstructive sleep apnea (adult) (pediatric): Secondary | ICD-10-CM | POA: Diagnosis not present

## 2017-12-12 ENCOUNTER — Encounter: Payer: 59 | Admitting: Cardiothoracic Surgery

## 2017-12-12 NOTE — Progress Notes (Signed)
[address]       [place] [address]             [phone]                  [name] Anderson [place] Medical Record #[id number] Date of Birth: 1955-11-18  Referring UR:[email or web address], Denice Bors, MD Primary Cardiology: Primary Care:Swayne, Shanon Brow, MD  Chief Complaint:  Follow Up Visit  Cancer Staging Testis, seminoma [facility]) Staging form: Testis, AJCC 7th Edition - Pathologic: Stage IB (T2, N0, M0) - Signed by Rexene Edison, MD on 10/01/2013  06/05/2017 DATE OF DISCHARGE:   OPERATIVE REPORT PREOPERATIVE DIAGNOSES:  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 PROCEDURE:  Aortic valve replacement with pericardial tissue valve [employer], model 3300TFX, 25 mm, serial #[id number]. SURGEON:  Lanelle Bal, M.D.  History of Present Illness:     Patient returns to the office today for postop check following aortic valve replacement for severe aortic insufficiency June 05, 2017.  The patient had had a previous history of aortic insufficiency, in the fall 2018 he had sudden increase in the degree of insufficiency and was found to have endocarditis he was treated with 8 weeks of IV antibiotics and underwent aortic valve replacement in February 2019.  Postop follow-up echocardiogram done April 2019 shows good functioning of his aortic valve aortic root measured it 3.9 cm.    Since last seen the patient is continued to improve denies any symptoms of congestive heart failure continues to follow recommendations concerning endocarditis, good dental care and antibiotic prophylaxis with invasive procedures.    Zubrod Score: At the time of surgery this patient's most appropriate activity status/level should be described as: [x]     0    Normal activity, no symptoms []     1    Restricted in physical strenuous  activity but ambulatory, able to do out light work []     2    Ambulatory and capable of self care, unable to do work activities, up and about                 >50 % of waking hours                                                                                   []     3    Only limited self care, in bed greater than 50% of waking hours []     4    Completely disabled, no self care, confined to bed or chair []     5    Moribund  Social History   Tobacco Use  Smoking Status Current Some Day Smoker  . Types: Pipe  Smokeless Tobacco Former Systems developer  . Types: Chew  . Quit date: 04/26/2011  Tobacco Comment   OCCASIONAL  PIPE SMOKER  (NEVER SMOKED CIGARETTES)       No Known Allergies  Current Outpatient Medications  Medication Sig Dispense Refill  . acetaminophen (TYLENOL) 500 MG tablet Take 1 tablet (500 mg total) by mouth every  6 (six) hours as needed (for pain.). 30 tablet 0  . aluminum chloride (HYPERCARE) 20 % external solution Apply 1 application topically 3 (three) times daily as needed (for sweating.).    Marland Kitchen Ascorbic Acid (VITAMIN C) 1000 MG tablet Take 1,000 mg by mouth daily.    Marland Kitchen aspirin EC 81 MG tablet Take 81 mg by mouth daily.    Marland Kitchen atorvastatin (LIPITOR) 10 MG tablet Take 1 tablet (10 mg total) by mouth daily at 6 PM. 30 tablet 6  . betamethasone valerate (VALISONE) 0.1 % cream Apply 1 application topically 2 (two) times daily as needed (for skin irritation.).    Marland Kitchen cetirizine (ZYRTEC) 10 MG tablet Take 10 mg by mouth daily as needed for allergies.    Marland Kitchen ECHINACEA PO Take 760 mg by mouth daily.    . fluorometholone (FML) 0.1 % ophthalmic suspension Place 1 drop into both eyes 3 (three) times daily as needed (for irritated/itchy eyes.).    Marland Kitchen Glucosamine-Chondroitin (COSAMIN DS PO) Take 1 tablet by mouth 2 (two) times daily.    Marland Kitchen loratadine (CLARITIN) 10 MG tablet Take 10 mg by mouth daily as needed for allergies.    . metoprolol tartrate (LOPRESSOR) 25 MG tablet Take 0.5 tablets (12.5  mg total) by mouth 2 (two) times daily. 90 tablet 3  . Naphazoline-Pheniramine (OPCON-A) 0.027-0.315 % SOLN Place 1-2 drops into both eyes 3 (three) times daily as needed (for allergy eyes.).    Marland Kitchen NON FORMULARY Take 2 capsules by mouth 2 (two) times daily. DoTerra (Microplex VMz) 2 Capsule twice daily    . NON FORMULARY Take 2 capsules by mouth 2 (two) times daily. XE0 Mega "Doterra"    . sildenafil (REVATIO) 20 MG tablet Take 20 mg by mouth daily as needed (for ED).     . SUPER B COMPLEX/C PO Take 1 tablet by mouth daily.    Marland Kitchen triamcinolone cream (KENALOG) 0.1 % Apply 1 application topically daily as needed (FOR DRY SKIN/ITCHY SKIN.). Apply to area after bath (DO NOT APPLY TO FACE)  1  . losartan (COZAAR) 50 MG tablet Take 1 tablet (50 mg total) by mouth daily. 90 tablet 3   No current facility-administered medications for this visit.        Physical Exam: BP 108/78   Pulse 91   Resp 20   Ht 5\' 11"  (1.803 m)   Wt 213 lb (96.6 kg)   SpO2 96% Comment: RA  BMI 29.71 kg/m   General appearance: alert and cooperative Neurologic: intact Heart: regular rate and rhythm, S1, S2 normal, no murmur, click, rub or gallop Lungs: clear to auscultation bilaterally Abdomen: soft, non-tender; bowel sounds normal; no masses,  no organomegaly Extremities: extremities normal, atraumatic, no cyanosis or edema and Homans sign is negative, no sign of DVT Wound: Sternum is stable and well-healed  Diagnostic Studies & Laboratory data:         Recent Radiology Findings: No results found.   Recent Labs: Lab Results  Component Value Date   WBC 11.1 (H) 06/08/2017   HGB 11.1 (L) 06/08/2017   HCT 33.3 (L) 06/08/2017   PLT 134 (L) 06/08/2017   GLUCOSE 102 (H) 06/08/2017   CHOL 165 03/30/2017   TRIG 91 03/30/2017   HDL 24 (L) 03/30/2017   LDLDIRECT 160.9 02/12/2013   LDLCALC 123 (H) 03/30/2017   ALT 13 (L) 05/30/2017   AST 19 05/30/2017   NA 135 06/08/2017   K 3.8 06/08/2017   CL 102 06/08/2017  CREATININE 0.92 06/08/2017   BUN 16 06/08/2017   CO2 23 06/08/2017   TSH 3.911 03/29/2017   INR 1.38 06/05/2017   HGBA1C 5.2 05/30/2017   Echocardiography  Patient:    [name] Anderson, [name] Anderson MR #:       [id number] Study Date: 07/30/2017 Gender:     M Age:        62 Height:     180.3 cm Weight:     92.1 kg BSA:        2.17 m^2 Pt. Status: Room:   [facility] [facility]  SONOGRAPHER  Oletta Lamas, Will  PERFORMING   [facility], Outpatient  ATTENDING    Skeet Latch, MD  cc:  ------------------------------------------------------------------- LV EF: 50% -   55%  ------------------------------------------------------------------- Indications:      (Z95.2).  ------------------------------------------------------------------- History:   PMH:  S/p bioprosthetic AVR (39mm Magna Ease). Dilated aortic root.. Acquired from the patient and from the patient&'s chart.  Risk factors:  Former tobacco use. Dyslipidemia.  ------------------------------------------------------------------- Study Conclusions  - Left ventricle: The cavity size was normal. There was moderate   concentric hypertrophy. Systolic function was normal. The   estimated ejection fraction was in the range of 50% to 55%. Mild   hypokinesis of the anteroseptal, inferior, and inferoseptal   myocardium. Doppler parameters are consistent with abnormal left   ventricular relaxation (grade 1 diastolic dysfunction). Doppler   parameters are consistent with indeterminate ventricular filling   pressure. - Aortic valve: A 68mm Edwards Magna Ease bioprosthesis was present   and functioning properly. There was no regurgitation. Peak   velocity (S): 238 cm/s. Mean gradient (S): 13 mm Hg. - Aorta: Ascending aortic diameter: 39 mm (S). - Ascending aorta: The ascending aorta was mildly dilated. - Mitral valve: There was mild regurgitation. - Right ventricle: The cavity size was normal. Wall  thickness was   normal. Systolic function was normal. - Tricuspid valve: There was mild regurgitation. - Pulmonary arteries: Systolic pressure was within the normal   range. PA peak pressure: 20 mm Hg (S).  ------------------------------------------------------------------- Study data:  Comparison was made to the study of 03/19/2017.  Study status:  Routine.  Procedure:  The patient reported no pain pre or post test. Transthoracic echocardiography for left ventricular function evaluation and for assessment of valvular function. Image quality was adequate.  Study completion:  There were no complications.          Echocardiography.  M-mode, complete 2D, spectral Doppler, and color Doppler.  Birthdate:  Patient birthdate: [birth year].  Age:  Patient is 62 yr old.  Sex:  Gender: male.    BMI: 28.3 kg/m^2.  Blood pressure:     137/99  Patient status:  Outpatient.  Study date:  Study date: 07/30/2017. Study time: 08:49 AM.  Location:  [facility]  -------------------------------------------------------------------  ------------------------------------------------------------------- Left ventricle:  The cavity size was normal. There was moderate concentric hypertrophy. Systolic function was normal. The estimated ejection fraction was in the range of 50% to 55%.  Regional wall motion abnormalities:   Mild hypokinesis of the anteroseptal, inferior, and inferoseptal myocardium. Doppler parameters are consistent with abnormal left ventricular relaxation (grade 1 diastolic dysfunction). Doppler parameters are consistent with indeterminate ventricular filling pressure.  ------------------------------------------------------------------- Aortic valve:  A 27mm Edwards Magna Ease bioprosthesis was present and functioning properly. Mobility was not restricted.  Doppler: Transvalvular velocity was minimally increased. There was no stenosis. There was no regurgitation.  VTI ratio of LVOT  to aortic valve: 0.36. Valve area (VTI): 1.49 cm^2. Indexed valve area (VTI): 0.69 cm^2/m^2. Peak velocity ratio of LVOT to aortic valve: 0.36. Valve area (Vmax): 1.49 cm^2. Indexed valve area (Vmax): 0.69 cm^2/m^2. Mean velocity ratio of LVOT to aortic valve: 0.36. Valve area (Vmean): 1.5 cm^2. Indexed valve area (Vmean): 0.69 cm^2/m^2.   Mean gradient (S): 13 mm Hg. Peak gradient (S): 23 mm Hg.  ------------------------------------------------------------------- Aorta:  Aortic root: The aortic root was normal in size. Ascending aorta: The ascending aorta was mildly dilated.  ------------------------------------------------------------------- Mitral valve:   Structurally normal valve.   Mobility was not restricted.  Doppler:  Transvalvular velocity was within the normal range. There was no evidence for stenosis. There was mild regurgitation.  ------------------------------------------------------------------- Left atrium:  The atrium was normal in size.  ------------------------------------------------------------------- Right ventricle:  The cavity size was normal. Wall thickness was normal. Systolic function was normal.  ------------------------------------------------------------------- Pulmonic valve:    Structurally normal valve.   Cusp separation was normal.  Doppler:  Transvalvular velocity was within the normal range. There was no evidence for stenosis. There was no regurgitation.  ------------------------------------------------------------------- Tricuspid valve:   Structurally normal valve.    Doppler: Transvalvular velocity was within the normal range. There was mild regurgitation.  ------------------------------------------------------------------- Pulmonary artery:   The main pulmonary artery was normal-sized. Systolic pressure was within the normal range.  ------------------------------------------------------------------- Right atrium:  The atrium was  normal in size.  ------------------------------------------------------------------- Pericardium:  There was no pericardial effusion.  ------------------------------------------------------------------- Systemic veins: Inferior vena cava: The vessel was normal in size. The respirophasic diameter changes were in the normal range (>= 50%), consistent with normal central venous pressure.  ------------------------------------------------------------------- Measurements   Left ventricle                            Value          Reference  LV ID, ED, PLAX chordal           (L)     36.9  mm       43 - 52  LV ID, ES, PLAX chordal                   27.7  mm       23 - 38  LV fx shortening, PLAX chordal    (L)     25    %        >=29  LV PW thickness, ED                       14.8  mm       ---------  IVS/LV PW ratio, ED                       0.97           <=1.3  Stroke volume, 2D                         71    ml       ---------  Stroke volume/bsa, 2D                     33    ml/m^2   ---------  LV ejection fraction, 1-p A4C             50    %        ---------  LV end-diastolic volume, 2-p              88    ml       ---------  LV end-systolic volume, 2-p               48    ml       ---------  LV ejection fraction, 2-p                 45    %        ---------  Stroke volume, 2-p                        40    ml       ---------  LV end-diastolic volume/bsa, 2-p          41    ml/m^2   ---------  LV end-systolic volume/bsa, 2-p           22    ml/m^2   ---------  Stroke volume/bsa, 2-p                    18.5  ml/m^2   ---------  LV e&', lateral                            8.38  cm/s     ---------  LV E/e&', lateral                          5.18           ---------  LV e&', medial                             4.09  cm/s     ---------  LV E/e&', medial                           10.61          ---------  LV e&', average                            6.24  cm/s     ---------  LV E/e&', average                           6.96           ---------    Ventricular septum                        Value          Reference  IVS thickness, ED                         14.31 mm       ---------    LVOT                                      Value          Reference  LVOT ID, S  23    mm       ---------  LVOT area                                 4.15  cm^2     ---------  LVOT ID                                   23    mm       ---------  LVOT peak velocity, S                     85.7  cm/s     ---------  LVOT mean velocity, S                     60.8  cm/s     ---------  LVOT VTI, S                               17.1  cm       ---------  LVOT peak gradient, S                     3     mm Hg    ---------  Stroke volume (SV), LVOT DP               71    ml       ---------  Stroke index (SV/bsa), LVOT DP            32.8  ml/m^2   ---------    Aortic valve                              Value          Reference  Aortic valve peak velocity, S             238   cm/s     ---------  Aortic valve mean velocity, S             168   cm/s     ---------  Aortic valve VTI, S                       47.6  cm       ---------  Aortic mean gradient, S                   13    mm Hg    ---------  Aortic peak gradient, S                   23    mm Hg    ---------  VTI ratio, LVOT/AV                        0.36           ---------  Aortic valve area, VTI                    1.49  cm^2     ---------  Aortic valve area/bsa, VTI                0.69  cm^2/m^2 ---------  Velocity ratio, peak, LVOT/AV             0.36           ---------  Aortic valve area, peak velocity          1.49  cm^2     ---------  Aortic valve area/bsa, peak               0.69  cm^2/m^2 ---------  velocity  Velocity ratio, mean, LVOT/AV             0.36           ---------  Aortic valve area, mean velocity          1.5   cm^2     ---------  Aortic valve area/bsa, mean               0.69  cm^2/m^2 ---------  velocity     Aorta                                     Value          Reference  Aortic root ID, ED                        39    mm       ---------  Ascending aorta ID, A-P, S                39    mm       ---------    Left atrium                               Value          Reference  LA ID, A-P, ES                            30    mm       ---------  LA ID/bsa, A-P                            1.39  cm/m^2   <=2.2  LA volume, S                              47    ml       ---------  LA volume/bsa, S                          21.7  ml/m^2   ---------  LA volume, ES, 1-p A4C                    36    ml       ---------  LA volume/bsa, ES, 1-p A4C                16.6  ml/m^2   ---------  LA volume, ES, 1-p A2C                    56    ml       ---------  LA volume/bsa, ES, 1-p A2C  25.9  ml/m^2   ---------    Mitral valve                              Value          Reference  Mitral E-wave peak velocity               43.4  cm/s     ---------  Mitral A-wave peak velocity               69.6  cm/s     ---------  Mitral deceleration time          (H)     419   ms       150 - 230  Mitral E/A ratio, peak                    0.6            ---------    Pulmonary arteries                        Value          Reference  PA pressure, S, DP                        20    mm Hg    <=30    Tricuspid valve                           Value          Reference  Tricuspid regurg peak velocity            204   cm/s     ---------  Tricuspid peak RV-RA gradient             17    mm Hg    ---------    Systemic veins                            Value          Reference  Estimated CVP                             3     mm Hg    ---------    Right ventricle                           Value          Reference  RV pressure, S, DP                        20    mm Hg    <=30  RV s&', lateral, S                         10.5  cm/s     ---------  Legend: (L)  and  (H)  mark values outside specified reference  range.  ------------------------------------------------------------------- Prepared and Electronically Authenticated by  Skeet Latch, MD 2019-04-02T13:31:20   Assessment / Plan:   Stable following tissue valve aortic valve replacement for worsening aortic insufficiency due to endocarditis February 2019.  We will plan  to see the patient back in 6 months which will be approximately 1 year postop with a follow-up CTA of the chest.    With the patient's prosthetic heart valve the risks of endocarditis have been discussed. The recommendations for periprocedural antibiotics including dental procedures have been discussed with the patient.     [name] 12/13/2017 10:57 AM

## 2017-12-13 ENCOUNTER — Ambulatory Visit (INDEPENDENT_AMBULATORY_CARE_PROVIDER_SITE_OTHER): Payer: 59 | Admitting: Cardiothoracic Surgery

## 2017-12-13 ENCOUNTER — Encounter: Payer: Self-pay | Admitting: Cardiothoracic Surgery

## 2017-12-13 VITALS — BP 108/78 | HR 91 | Resp 20 | Ht 71.0 in | Wt 213.0 lb

## 2017-12-13 DIAGNOSIS — Z952 Presence of prosthetic heart valve: Secondary | ICD-10-CM

## 2017-12-13 DIAGNOSIS — I351 Nonrheumatic aortic (valve) insufficiency: Secondary | ICD-10-CM

## 2018-01-11 DIAGNOSIS — G4733 Obstructive sleep apnea (adult) (pediatric): Secondary | ICD-10-CM | POA: Diagnosis not present

## 2018-01-15 DIAGNOSIS — G4733 Obstructive sleep apnea (adult) (pediatric): Secondary | ICD-10-CM | POA: Diagnosis not present

## 2018-01-16 NOTE — Progress Notes (Signed)
HPI: FUAVR.Echocardiogram repeated November 2018. Ejection fraction 50-55%, severe left ventricular enlargement, grade 1 diastolic dysfunction, severe aortic insufficiency, dilated aortic root at 46 mm, mild mitral regurgitation and mild left atrial enlargement. CTA of thoracic aorta repeated November 2018. The aortic root measured 4.7 cm. Transesophageal echocardiogram November 2018 showed normal LV function, aortic valve vegetation on left coronary cusp, severe aortic insufficiency, mildly dilated aortic root, redundant mitral valve chordae and associated vegetation could not be excluded, mild mitral regurgitation. Blood cultures grew coag negative staph. Patient placed on antibiotics with plans for aortic valve/aortic root replacement after completing 6 weeks. Cardiac catheterization12/20 showed normal coronary arteries and normal right and left heart pressures. Preoperative carotid Dopplers January 2019 showed no stenosis. Patient underwent aortic valve replacement (bioprosthetic AVR) on June 05, 2017.  The aortic root was not replaced as it measured 4.1 cm at time of surgery. Patient did have postoperative atrial fibrillation treated with amiodarone.  Echocardiogram April 2019 showed ejection fraction 50 to 09%, grade 1 diastolic dysfunction, bioprosthetic aortic valve with no aortic insufficiency and mean gradient 13 mmHg, ascending aorta 39 mm, mild mitral regurgitation. Since last seen, the patient denies any dyspnea on exertion, orthopnea, PND, pedal edema, palpitations, syncope or chest pain.  Current Outpatient Medications  Medication Sig Dispense Refill  . acetaminophen (TYLENOL) 500 MG tablet Take 1 tablet (500 mg total) by mouth every 6 (six) hours as needed (for pain.). 30 tablet 0  . aluminum chloride (HYPERCARE) 20 % external solution Apply 1 application topically 3 (three) times daily as needed (for sweating.).    Marland Kitchen Ascorbic Acid (VITAMIN C) 1000 MG tablet Take 1,000 mg by  mouth daily.    Marland Kitchen aspirin EC 81 MG tablet Take 81 mg by mouth daily.    Marland Kitchen atorvastatin (LIPITOR) 10 MG tablet Take 1 tablet (10 mg total) by mouth daily at 6 PM. 30 tablet 6  . betamethasone valerate (VALISONE) 0.1 % cream Apply 1 application topically 2 (two) times daily as needed (for skin irritation.).    Marland Kitchen cetirizine (ZYRTEC) 10 MG tablet Take 10 mg by mouth daily as needed for allergies.    Marland Kitchen ECHINACEA PO Take 760 mg by mouth daily.    . fluorometholone (FML) 0.1 % ophthalmic suspension Place 1 drop into both eyes 3 (three) times daily as needed (for irritated/itchy eyes.).    Marland Kitchen Glucosamine-Chondroitin (COSAMIN DS PO) Take 1 tablet by mouth 2 (two) times daily.    Marland Kitchen loratadine (CLARITIN) 10 MG tablet Take 10 mg by mouth daily as needed for allergies.    . metoprolol tartrate (LOPRESSOR) 25 MG tablet Take 0.5 tablets (12.5 mg total) by mouth 2 (two) times daily. 90 tablet 3  . Naphazoline-Pheniramine (OPCON-A) 0.027-0.315 % SOLN Place 1-2 drops into both eyes 3 (three) times daily as needed (for allergy eyes.).    Marland Kitchen NON FORMULARY Take 2 capsules by mouth 2 (two) times daily. DoTerra (Microplex VMz) 2 Capsule twice daily    . NON FORMULARY Take 2 capsules by mouth 2 (two) times daily. XE0 Mega "Doterra"    . sildenafil (REVATIO) 20 MG tablet Take 20 mg by mouth daily as needed (for ED).     . SUPER B COMPLEX/C PO Take 1 tablet by mouth daily.    Marland Kitchen triamcinolone cream (KENALOG) 0.1 % Apply 1 application topically daily as needed (FOR DRY SKIN/ITCHY SKIN.). Apply to area after bath (DO NOT APPLY TO FACE)  1  . losartan (COZAAR) 50  MG tablet Take 1 tablet (50 mg total) by mouth daily. 90 tablet 3   No current facility-administered medications for this visit.      Past Medical History:  Diagnosis Date  . Allergy   . Anemia   . Barrett's esophagus - short segment 03/13/2017  . Cancer ([place]) 09/11/13   testicular  . Complication of anesthesia    hard to wake  - 20 years ago  . ED (erectile  dysfunction)   . Endocarditis    Archie Endo 03/29/2017  . H/O aneurysm 03/05/2017  . High cholesterol   . History of basal cell carcinoma excision    02/2002   --  NOSE  &   2013  RIGHT EAR  . History of kidney stones   . Hyperlipidemia   . Mild obstructive sleep apnea    PER STUDY 01-18-2005--  NO CPAP OR MOUTH GUARD  . Moderate aortic valve insufficiency   . Multiple pulmonary nodules    PER CT--  PROBABLE  GRANULOMATOUS - patient is not aware of this  . Personal history of colonic polyps    TUBULAR ADENOMA--   . Seasonal allergies   . 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[place])   . Testicular mass    RIGHT    Past Surgical History:  Procedure Laterality Date  . AORTIC VALVE REPLACEMENT N/A 06/05/2017   Procedure: AORTIC VALVE REPLACEMENT (AVR) using 64mm Magna Ease valve;  Surgeon: [name], MD;  Location: [place];  Service: Open Heart Surgery;  Laterality: N/A;  . COLONOSCOPY  01/24/06, 07/10/11  . COLONOSCOPY WITH ESOPHAGOGASTRODUODENOSCOPY (EGD)    . EXTRACORPOREAL SHOCK WAVE LITHOTRIPSY    . HERNIA REPAIR    . MOHS SURGERY  02/2002  &  2013   TIP OF NOSE-///     RIGHT EAR  . ORCHIECTOMY Right 09/11/2013   Procedure:  RIGHT ORCHIECTOMY;  Surgeon: Jorja Loa, MD;  Location: [facility];  Service: Urology;  Laterality: Right;  . RIGHT/LEFT HEART CATH AND CORONARY ANGIOGRAPHY N/A 04/18/2017   Procedure: RIGHT/LEFT HEART CATH AND CORONARY ANGIOGRAPHY;  Surgeon: Sherren Mocha, MD;  Location: [facility];  Service: Cardiovascular;  Laterality: N/A;  . SCROTAL EXPLORATION Right 09/11/2013   Procedure: RIGHT INGUINAL EXPLORATION;  Surgeon: Jorja Loa, MD;  Location: [facility];  Service: Urology;  Laterality: Right;  . TEE WITHOUT CARDIOVERSION N/A 03/29/2017   Procedure: TRANSESOPHAGEAL ECHOCARDIOGRAM (TEE);  Surgeon: Lelon Perla, MD;  Location: [facility] ENDOSCOPY;  Service: Cardiovascular;  Laterality: N/A;  . TEE WITHOUT CARDIOVERSION N/A  06/05/2017   Procedure: TRANSESOPHAGEAL ECHOCARDIOGRAM (TEE);  Surgeon: [name], MD;  Location: [place];  Service: Open Heart Surgery;  Laterality: N/A;  . TRANSTHORACIC ECHOCARDIOGRAM  02-25-2013   DR Nathanyl Andujo   NORMAL LVSF/  EF 55-60%/   GRADE 2 DIASTOLIC DYSFUNCTION/  ECCENTRIC MODERATE AORTIC INSUFFICIENCY WITHOUT STENOSIS/  MILD DILATED AORTIC ROOT/ TRIVIAL MR, TR,  & PR  . UMBILICAL HERNIA REPAIR  06-08-2009  . URETEROSCOPIC LASER LITHOTRIPSY STONE EXTRACTION  2007    Social History   Socioeconomic History  . Marital status: Married    Spouse name: Coralyn Mark  . Number of children: 2  . Years of education: 31  . Highest education level: Not on file  Occupational History  . 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 Financial resource strain: Not on file  . Food insecurity:    Worry: Not on file    Inability: Not on file  .  Transportation needs:    Medical: Not on file    Non-medical: Not on file  Tobacco Use  . Smoking status: Current Some Day Smoker    Types: Pipe  . Smokeless tobacco: Former Systems developer    Types: [place] date: 04/26/2011  . Tobacco comment: OCCASIONAL  PIPE SMOKER  (NEVER SMOKED CIGARETTES)  Substance and Sexual Activity  . Alcohol use: Yes    Comment: 03/29/2017 "might have a glass of wine twice/year"  . Drug use: No  . Sexual activity: Yes  Lifestyle  . Physical activity:    Days per week: Not on file    Minutes per session: Not on file  . Stress: Not on file  Relationships  . Social connections:    Talks on phone: Not on file    Gets together: Not on file    Attends religious service: Not on file    Active member of club or organization: Not on file    Attends meetings of clubs or organizations: Not on file    Relationship status: Not on file  . Intimate partner violence:    Fear of current or ex partner: Not on file    Emotionally abused: Not on file    Physically abused: Not on file    Forced sexual activity: Not on file  Other  Topics Concern  . Not on file  Social History Narrative   Lives with wife Coralyn Mark   Caffeine use: coffee- 1 cup per day    Family History  Problem Relation Age of Onset  . Colon cancer Father   . Heart disease Paternal Grandmother     ROS: no fevers or chills, productive cough, hemoptysis, dysphasia, odynophagia, melena, hematochezia, dysuria, hematuria, rash, seizure activity, orthopnea, PND, pedal edema, claudication. Remaining systems are negative.  Physical Exam: Well-developed well-nourished in no acute distress.  Skin is warm and dry.  HEENT is normal.  Neck is supple.  Chest is clear to auscultation with normal expansion.  Cardiovascular exam is regular rate and rhythm. 1/6 systolic murmur; no DM Abdominal exam nontender or distended. No masses palpated. Extremities show no edema. neuro grossly intact  A/P  1 status post aortic valve replacement-patient will continue SBE prophylaxis.  2 thoracic aortic aneurysm-we will plan to repeat CTA February 2020.  3 hypertension-blood pressure is controlled today.  Continue present medications.  4 hyperlipidemia-continue statin.  Lipids and liver are followed by primary care.  Kirk Ruths, MD

## 2018-01-23 ENCOUNTER — Ambulatory Visit (INDEPENDENT_AMBULATORY_CARE_PROVIDER_SITE_OTHER): Payer: 59 | Admitting: Cardiology

## 2018-01-23 ENCOUNTER — Encounter: Payer: Self-pay | Admitting: Cardiology

## 2018-01-23 VITALS — BP 126/86 | HR 73 | Ht 70.0 in | Wt 222.0 lb

## 2018-01-23 DIAGNOSIS — I77810 Thoracic aortic ectasia: Secondary | ICD-10-CM | POA: Diagnosis not present

## 2018-01-23 DIAGNOSIS — Z952 Presence of prosthetic heart valve: Secondary | ICD-10-CM

## 2018-01-23 DIAGNOSIS — I10 Essential (primary) hypertension: Secondary | ICD-10-CM

## 2018-01-23 NOTE — Patient Instructions (Signed)
Your physician wants you to follow-up in: ONE YEAR WITH DR CRENSHAW You will receive a reminder letter in the mail two months in advance. If you don't receive a letter, please call our office to schedule the follow-up appointment.   If you need a refill on your cardiac medications before your next appointment, please call your pharmacy.  

## 2018-01-27 ENCOUNTER — Other Ambulatory Visit: Payer: Self-pay | Admitting: Dermatology

## 2018-01-27 DIAGNOSIS — C4491 Basal cell carcinoma of skin, unspecified: Secondary | ICD-10-CM

## 2018-01-27 DIAGNOSIS — C44519 Basal cell carcinoma of skin of other part of trunk: Secondary | ICD-10-CM | POA: Diagnosis not present

## 2018-01-27 DIAGNOSIS — C44712 Basal cell carcinoma of skin of right lower limb, including hip: Secondary | ICD-10-CM | POA: Diagnosis not present

## 2018-01-27 DIAGNOSIS — L309 Dermatitis, unspecified: Secondary | ICD-10-CM | POA: Diagnosis not present

## 2018-01-27 DIAGNOSIS — C44619 Basal cell carcinoma of skin of left upper limb, including shoulder: Secondary | ICD-10-CM | POA: Diagnosis not present

## 2018-01-27 DIAGNOSIS — L570 Actinic keratosis: Secondary | ICD-10-CM | POA: Diagnosis not present

## 2018-01-27 DIAGNOSIS — D485 Neoplasm of uncertain behavior of skin: Secondary | ICD-10-CM | POA: Diagnosis not present

## 2018-01-27 HISTORY — DX: Basal cell carcinoma of skin, unspecified: C44.91

## 2018-02-05 DIAGNOSIS — G4733 Obstructive sleep apnea (adult) (pediatric): Secondary | ICD-10-CM | POA: Diagnosis not present

## 2018-02-10 DIAGNOSIS — G4733 Obstructive sleep apnea (adult) (pediatric): Secondary | ICD-10-CM | POA: Diagnosis not present

## 2018-02-26 DIAGNOSIS — Z23 Encounter for immunization: Secondary | ICD-10-CM | POA: Diagnosis not present

## 2018-03-04 DIAGNOSIS — I10 Essential (primary) hypertension: Secondary | ICD-10-CM | POA: Diagnosis not present

## 2018-03-04 DIAGNOSIS — Z0000 Encounter for general adult medical examination without abnormal findings: Secondary | ICD-10-CM | POA: Diagnosis not present

## 2018-03-04 DIAGNOSIS — E7800 Pure hypercholesterolemia, unspecified: Secondary | ICD-10-CM | POA: Diagnosis not present

## 2018-03-04 DIAGNOSIS — R1904 Left lower quadrant abdominal swelling, mass and lump: Secondary | ICD-10-CM | POA: Diagnosis not present

## 2018-03-05 ENCOUNTER — Other Ambulatory Visit: Payer: Self-pay | Admitting: Family Medicine

## 2018-03-05 DIAGNOSIS — C44619 Basal cell carcinoma of skin of left upper limb, including shoulder: Secondary | ICD-10-CM | POA: Diagnosis not present

## 2018-03-05 DIAGNOSIS — C44712 Basal cell carcinoma of skin of right lower limb, including hip: Secondary | ICD-10-CM | POA: Diagnosis not present

## 2018-03-05 DIAGNOSIS — R1905 Periumbilic swelling, mass or lump: Secondary | ICD-10-CM

## 2018-03-05 DIAGNOSIS — C44519 Basal cell carcinoma of skin of other part of trunk: Secondary | ICD-10-CM | POA: Diagnosis not present

## 2018-03-06 ENCOUNTER — Other Ambulatory Visit: Payer: Self-pay | Admitting: Family Medicine

## 2018-03-06 DIAGNOSIS — R1904 Left lower quadrant abdominal swelling, mass and lump: Secondary | ICD-10-CM

## 2018-03-11 ENCOUNTER — Ambulatory Visit
Admission: RE | Admit: 2018-03-11 | Discharge: 2018-03-11 | Disposition: A | Discharge status: Home / Self Care | Payer: 59 | Source: Ambulatory Visit | Attending: Family Medicine | Admitting: Family Medicine

## 2018-03-11 DIAGNOSIS — K429 Umbilical hernia without obstruction or gangrene: Secondary | ICD-10-CM | POA: Diagnosis not present

## 2018-03-11 DIAGNOSIS — R1904 Left lower quadrant abdominal swelling, mass and lump: Secondary | ICD-10-CM

## 2018-03-13 DIAGNOSIS — G4733 Obstructive sleep apnea (adult) (pediatric): Secondary | ICD-10-CM | POA: Diagnosis not present

## 2018-04-07 ENCOUNTER — Telehealth: Payer: Self-pay | Admitting: *Deleted

## 2018-04-07 ENCOUNTER — Ambulatory Visit: Payer: Self-pay | Admitting: Surgery

## 2018-04-07 DIAGNOSIS — K429 Umbilical hernia without obstruction or gangrene: Secondary | ICD-10-CM | POA: Diagnosis not present

## 2018-04-07 NOTE — Telephone Encounter (Signed)
   [facility] Pre-operative Risk Assessment    Request for surgical clearance:  1. What type of surgery is being performed? LAPAROSCOPIC REPAIR OF RECURRENT UMBILICAL HERNIA MESH  2. When is this surgery scheduled? TBD  3. What type of clearance is required (medical clearance vs. Pharmacy clearance to hold med vs. Both)?  MEDICAL  4. Are there any medications that need to be held prior to surgery and how long? ASPIRIN  5. Practice name and name of physician performing surgery? [facility]; DR TSUEI  6. What is your office phone number [phone]    7.   What is your office fax number [place]  RMA  8.   Anesthesia type (None, local, MAC, general) ? GENERAL   [name] Anderson 04/07/2018, 5:27 PM  _________________________________________________________________   (provider comments below)

## 2018-04-07 NOTE — H&P (Signed)
History of Present Illness [name] Anderson. [name] Meneely MD; 04/07/2018 9:38 AM) The patient is a 62 year old male who presents with an umbilical hernia. Referred by Dr. Antony Anderson for umbilical hernia. Cardiology - [name] Anderson Cardiac Surgery - [name] Anderson  This is a 63 year old male who is status post open repair of umbilical hernia in 1572 by Dr. Dalbert Anderson. He had a defect that was repaired with a 6.4 cm proceed ventral patch. This was secured with Novofil sutures. 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 He had a part-time job over the last several years it did involve some heavy lifting. The patient had heart valve replacement in February 2019 and has not returned to work. About 3 months ago, he began noticing some swelling and a palpable mass to the left and above his umbilicus. This fluctuates in size. He denies any changes in bowel movements. He mentioned this to Dr. Moreen Anderson who ordered an ultrasound. This showed a left periumbilical hernia with a 9 mm defect and a 3.2 x 2.1 x 4.1 cm hernia sac.   CLINICAL DATA: Soft tissue mass superior and to the left of the umbilical region for the past 2 months. Possible hernia.  EXAM: ULTRASOUND ABDOMEN LIMITED  COMPARISON: None.  FINDINGS: The palpable finding is just superior to the left of the umbilicus and measures 3.2 x 2.1 x 4.1 cm. It appears to communicate with the intraperitoneal cavity through a neck measuring 9 mm in diameter. With Valsalva abdominal contents are seen to move into an hour of the hernia but these abdominal contents do not completely retract.  IMPRESSION: Left periumbilical hernia containing peritoneal fat with some change in size with Valsalva.   Electronically Signed By: [name] Anderson M.D. On: 03/11/2018 10:07   Past Surgical History [name] Anderson, [name] Anderson; 04/07/2018 9:03 AM) Colon Polyp Removal - Colonoscopy Valve Replacement  Diagnostic Studies History [name] Anderson, [name] Anderson; 04/07/2018  9:03 AM) Colonoscopy within last year  Allergies [name] Anderson, CMA; 04/07/2018 9:04 AM) No Known Drug Allergies [04/07/2018]: Allergies Reconciled  Medication History [name] Anderson, CMA; 04/07/2018 9:04 AM) Atorvastatin Calcium (10MG  Tablet, Oral) Active. Losartan Potassium (50MG  Tablet, Oral) Active. Metoprolol Tartrate (25MG  Tablet, Oral) Active. Shingrix (50MCG/0.5ML For Suspension, Intramuscular) Active. Medications Reconciled  Social History [name] Anderson, [name] Anderson; 04/07/2018 9:03 AM) Alcohol use Moderate alcohol use. Caffeine use Coffee. No drug use Tobacco use Current some day smoker.  Family History [name] Anderson, [name] Anderson; 04/07/2018 9:03 AM) Arthritis Mother. Cancer Mother. Colon Cancer Father. Hypertension Mother. Thyroid problems Mother.  Other Problems [name] Anderson, CMA; 04/07/2018 9:03 AM) Back Pain Cancer Congestive Heart Failure High blood pressure Hypercholesterolemia Kidney Stone Melanoma Other disease, cancer, significant illness Umbilical Hernia Repair     Review of Systems [name] Anderson CMA; 04/07/2018 9:03 AM) General Not Present- Appetite Loss, Chills, Fatigue, Fever, Night Sweats, Weight Gain and Weight Loss. Skin Not Present- Change in Wart/Mole, Dryness, Hives, Jaundice, New Lesions, Non-Healing Wounds, Rash and Ulcer. HEENT Present- Seasonal Allergies and Wears glasses/contact lenses. Not Present- Earache, Hearing Loss, Hoarseness, Nose Bleed, Oral Ulcers, Ringing in the Ears, Sinus Pain, Sore Throat, Visual Disturbances and Yellow Eyes. Respiratory Not Present- Bloody sputum, Chronic Cough, Difficulty Breathing, Snoring and Wheezing. Breast Not Present- Breast Mass, Breast Pain, Nipple Discharge and Skin Changes. Cardiovascular Not Present- Chest Pain, Difficulty Breathing Lying Down, Leg Cramps, Palpitations, Rapid Heart Rate, Shortness of Breath and Swelling of Extremities. Gastrointestinal Not Present-  Abdominal Pain, Bloating, Bloody Stool, Change in Bowel Habits, Chronic diarrhea, Constipation, Difficulty  Swallowing, Excessive gas, Gets full quickly at meals, Hemorrhoids, Indigestion, Nausea, Rectal Pain and Vomiting. Male Genitourinary Not Present- Blood in Urine, Change in Urinary Stream, Frequency, Impotence, Nocturia, Painful Urination, Urgency and Urine Leakage. Musculoskeletal Present- Joint Pain. Not Present- Back Pain, Joint Stiffness, Muscle Pain, Muscle Weakness and Swelling of Extremities. Neurological Not Present- Decreased Memory, Fainting, Headaches, Numbness, Seizures, Tingling, Tremor, Trouble walking and Weakness. Psychiatric Not Present- Anxiety, Bipolar, Change in Sleep Pattern, Depression, Fearful and Frequent crying. Endocrine Not Present- Cold Intolerance, Excessive Hunger, Hair Changes, Heat Intolerance, Hot flashes and New Diabetes.  Vitals [name] Anderson CMA; 04/07/2018 9:04 AM) 04/07/2018 9:03 AM Weight: 224 lb Height: 70in Body Surface Area: 2.19 m Body Mass Index: 32.14 kg/m  Temp.: 98.36F  Pulse: 60 (Regular)  BP: 112/78 (Sitting, Left Arm, Standard)      Physical Exam [name] Key K. Meliton Samad MD; 04/07/2018 9:39 AM)  The physical exam findings are as follows: Note:WDWN in NAD Eyes: Pupils equal, round; sclera anicteric HENT: Oral mucosa moist; good dentition Neck: No masses palpated, no thyromegaly Lungs: CTA bilaterally; normal respiratory effort Chest: healed median sternotomy scar CV: Regular rate and rhythm; no murmurs; extremities well-perfused with no edema Abd: +bowel sounds, soft, non-tender, no palpable organomegaly; healed infraumbilical incision. About 3 cm above the umbilicus and 2 cm to the left of midline there is a palpable mass. This enlarges significantly to about 4 cm with Valsalva maneuver. It is partially reducible. No overlying skin changes. Skin: Warm, dry; no sign of jaundice Psychiatric - alert and oriented x 4; calm mood  and affect    Assessment & Plan [name] Key K. Colden Samaras MD; 04/07/2018 9:40 AM)  RECURRENT UMBILICAL HERNIA (E26.8)  Current Plans Schedule for Surgery - Laparoscopic repair of recurrent umbilical hernia with mesh. The surgical procedure has been discussed with the patient. Potential risks, benefits, alternative treatments, and expected outcomes have been explained. All of the patient's questions at this time have been answered. The likelihood of reaching the patient's treatment goal is good. The patient understand the proposed surgical procedure and wishes to proceed.  We will schedule his procedure at [facility] and keep him overnight because of his recent cardiac surgery.  Note:He seems to have developed a recurrent hernia around one of the stay sutures. I would recommend a laparoscopic approach to this hernia repair in order to examine all of the old mesh. We will likely put a new piece of mesh over this area.

## 2018-04-12 DIAGNOSIS — G4733 Obstructive sleep apnea (adult) (pediatric): Secondary | ICD-10-CM | POA: Diagnosis not present

## 2018-04-14 NOTE — Telephone Encounter (Signed)
Dr. Stanford Breed with pt's prior endocarditis and AVR should he receive SBE for his lap repair of his umbilical hernia mesh?  Thanks. Otherwise he feels well.

## 2018-04-14 NOTE — Telephone Encounter (Signed)
No need for SBE prophylaxis Aaron Edelman Stanford Breed

## 2018-04-14 NOTE — Telephone Encounter (Signed)
   Primary Cardiologist: Kirk Ruths, MD  Chart reviewed as part of pre-operative protocol coverage. Patient was contacted 04/14/2018 in reference to pre-operative risk assessment for pending surgery as outlined below.  [name] Anderson was last seen on 01/23/18 by Dr. Stanford Breed.  Since that day, [name] Anderson has done well, no chest pain and no SOB.Marland Kitchen  He may hold asprin for 5 days and resume as soon as possible.  Therefore, based on ACC/AHA guidelines, the patient would be at acceptable risk for the planned procedure without further cardiovascular testing.   I will route this recommendation to the requesting party via Epic fax function and remove from pre-op pool.  Please call with questions.  Cecilie Kicks, NP 04/14/2018, 4:22 PM

## 2018-04-22 DIAGNOSIS — G4733 Obstructive sleep apnea (adult) (pediatric): Secondary | ICD-10-CM | POA: Diagnosis not present

## 2018-05-05 NOTE — Telephone Encounter (Signed)
Per 04/07/2018 phone note:  Lelon Perla, MD  to Isaiah Serge, NP      3:27 PM  Note    No need for SBE prophylaxis Aaron Edelman C[place]shaw           3:15 PM  Isaiah Serge, NP routed this conversation to Lelon Perla, MD  Isaiah Serge, NP       3:15 PM  Note    Dr. Stanford Breed with pt's prior endocarditis and AVR should he receive SBE for his lap repair of his umbilical hernia mesh?  Thanks. Otherwise he feels well.

## 2018-05-12 ENCOUNTER — Other Ambulatory Visit: Payer: Self-pay | Admitting: Cardiothoracic Surgery

## 2018-05-12 DIAGNOSIS — I77810 Thoracic aortic ectasia: Secondary | ICD-10-CM

## 2018-05-13 DIAGNOSIS — G4733 Obstructive sleep apnea (adult) (pediatric): Secondary | ICD-10-CM | POA: Diagnosis not present

## 2018-05-14 NOTE — Pre-Procedure Instructions (Signed)
BARY LIMBACH  05/14/2018      [id number] [address], [place] [address] Phone: [phone] Fax: [phone]  [place], [place] [facility] [address] [place] [address] [place] [address] Phone: [phone] Fax: [phone]    Your procedure is scheduled on Thursday January 23rd.  Report to [facility] Admitting at [place].M.  Call this number if you have problems the morning of surgery:  [phone]   Remember:  Do not eat after midnight.  You may drink clear liquids until 0430am .  Clear liquids allowed are:                    Water, Juice (non-citric and without pulp), Carbonated beverages, Clear Tea, Black Coffee only and Gatorade    Take these medicines the morning of surgery with A SIP OF WATER  acetaminophen (TYLENOL)  If needed cetirizine (ZYRTEC)  If needed fluorometholone (FML) 0.1 % ophthalmic suspension - if needed loratadine (CLARITIN) if needed metoprolol tartrate (LOPRESSOR)  Stop taking your aspirin 5 Days prior to your surgery per Dr. Stanford Breed.  7 days prior to surgery STOP taking any Aleve, Naproxen, Ibuprofen, Motrin, Advil, Goody's, BC's, all herbal medications, fish oil, and all vitamins.     Do not wear jewelry, make-up or nail polish.  Do not wear lotions, powders, or perfumes, or deodorant.  Do not shave 48 hours prior to surgery.  Men may shave face and neck.  Do not bring valuables to the hospital.  [facility] is not responsible for any belongings or valuables.  Contacts, dentures or bridgework may not be worn into surgery.  Leave your suitcase in the car.  After surgery it may be brought to your room.  For patients admitted to the hospital, discharge time will be determined by your treatment team.  Patients discharged the day of surgery will not be allowed to drive home.    [facility]- Preparing For Surgery  Before surgery, you can play an important role. Because skin is not sterile, your skin needs to be as free of germs as possible. You can reduce the number of germs on your skin by washing with CHG (chlorahexidine gluconate) Soap before surgery.  CHG is an antiseptic cleaner which kills germs and bonds with the skin to continue killing germs even after washing.    Oral Hygiene is also important to reduce your risk of infection.  Remember - BRUSH YOUR TEETH THE MORNING OF SURGERY WITH YOUR REGULAR TOOTHPASTE  Please do not use if you have an allergy to CHG or antibacterial soaps. If your skin becomes reddened/irritated stop using the CHG.  Do not shave (including legs and underarms) for at least 48 hours prior to first CHG shower. It is OK to shave your face.  Please follow these instructions carefully.   1. Shower the NIGHT BEFORE SURGERY and the MORNING OF SURGERY with CHG.   2. If you chose to wash your hair, wash your hair first as usual with your normal shampoo.  3. After you shampoo, rinse your hair and body thoroughly to remove the shampoo.  4. Use CHG as you would any other liquid soap. You can apply CHG directly to the skin and wash gently with a scrungie or a clean washcloth.   5. Apply the CHG Soap to  your body ONLY FROM THE NECK DOWN.  Do not use on open wounds or open sores. Avoid contact with your eyes, ears, mouth and genitals (private parts). Wash Face and genitals (private parts)  with your normal soap.  6. Wash thoroughly, paying special attention to the area where your surgery will be performed.  7. Thoroughly rinse your body with warm water from the neck down.  8. DO NOT shower/wash with your normal soap after using and rinsing off the CHG Soap.  9. Pat yourself dry with a CLEAN TOWEL.  10. Wear CLEAN PAJAMAS to bed the night before surgery, wear comfortable clothes the morning of surgery  11. Place CLEAN SHEETS on your bed the night of  your first shower and DO NOT SLEEP WITH PETS.    Day of Surgery:  Do not apply any deodorants/lotions.  Please wear clean clothes to the hospital/surgery center.   Remember to brush your teeth WITH YOUR REGULAR TOOTHPASTE.   Please read over the following fact sheets that you were given.

## 2018-05-15 ENCOUNTER — Encounter (HOSPITAL_COMMUNITY)
Admission: RE | Admit: 2018-05-15 | Discharge: 2018-05-15 | Disposition: A | Discharge status: Home / Self Care | Payer: 59 | Source: Ambulatory Visit | Attending: Surgery | Admitting: Surgery

## 2018-05-15 ENCOUNTER — Encounter (HOSPITAL_COMMUNITY): Payer: Self-pay

## 2018-05-15 ENCOUNTER — Other Ambulatory Visit: Payer: Self-pay

## 2018-05-15 DIAGNOSIS — Z01812 Encounter for preprocedural laboratory examination: Secondary | ICD-10-CM | POA: Diagnosis present

## 2018-05-15 HISTORY — DX: Essential (primary) hypertension: I10

## 2018-05-15 LAB — CBC
HCT: 53.7 % — ABNORMAL HIGH (ref 39.0–52.0)
Hemoglobin: 17.7 g/dL — ABNORMAL HIGH (ref 13.0–17.0)
MCH: 29.5 pg (ref 26.0–34.0)
MCHC: 33 g/dL (ref 30.0–36.0)
MCV: 89.4 fL (ref 80.0–100.0)
Platelets: 243 10*3/uL (ref 150–400)
RBC: 6.01 MIL/uL — ABNORMAL HIGH (ref 4.22–5.81)
RDW: 13.3 % (ref 11.5–15.5)
WBC: 7.9 10*3/uL (ref 4.0–10.5)
nRBC: 0 % (ref 0.0–0.2)

## 2018-05-15 LAB — BASIC METABOLIC PANEL
Anion gap: 10 (ref 5–15)
BUN: 20 mg/dL (ref 8–23)
CO2: 23 mmol/L (ref 22–32)
Calcium: 9.6 mg/dL (ref 8.9–10.3)
Chloride: 106 mmol/L (ref 98–111)
Creatinine, Ser: 1.11 mg/dL (ref 0.61–1.24)
GFR calc Af Amer: >60 mL/min (ref >60)
GFR calc non Af Amer: >60 mL/min (ref >60)
Glucose, Bld: 112 mg/dL — ABNORMAL HIGH (ref 70–99)
Potassium: 4.3 mmol/L (ref 3.5–5.1)
Sodium: 139 mmol/L (ref 135–145)

## 2018-05-15 NOTE — Progress Notes (Signed)
PCP - Dr. Antony Contras Cardiologist - Dr. Stanford Breed  Chest x-ray - 07/08/17 EKG - 06/26/17 ECHO - 07/2017 Cardiac Cath - 03/2017  Sleep Study - 2019 CPAP - will bring mask and hose  Blood Thinner Instructions: N/A Aspirin Instructions: stop 5 days prior  Anesthesia review: yes, cardiac history  Patient denies shortness of breath, fever, cough and chest pain at PAT appointment   Patient verbalized understanding of instructions that were given to them at the PAT appointment. Patient was also instructed that they will need to review over the PAT instructions again at home before surgery.

## 2018-05-16 NOTE — Anesthesia Preprocedure Evaluation (Addendum)
Anesthesia Evaluation  Patient identified by MRN, date of birth, ID band Patient awake    Reviewed: Allergy & Precautions, NPO status , Patient's Chart, lab work & pertinent test results, reviewed documented beta blocker date and time   History of Anesthesia Complications (+) history of anesthetic complications  Airway Mallampati: III  TM Distance: >3 FB Neck ROM: Full    Dental no notable dental hx. (+) Teeth Intact   Pulmonary sleep apnea and Continuous Positive Airway Pressure Ventilation , Current Smoker,    Pulmonary exam normal breath sounds clear to auscultation       Cardiovascular hypertension, Pt. on medications and Pt. 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  LVEF 50-55% 07/2017   Neuro/Psych negative neurological ROS  negative psych ROS   GI/Hepatic Neg liver ROS, GERD  Medicated,Hx/o Barrett's esophagus   Endo/Other  Hyperlipidemia  Renal/GU Renal diseaseHx/o renal calculi  negative genitourinary   Musculoskeletal Umbilical hernia   Abdominal (+) + obese,   Peds  Hematology  (+) anemia ,   Anesthesia Other Findings   Reproductive/Obstetrics Hx/o seminoma testicle s/p orchiectomy                           Anesthesia Physical Anesthesia Plan  ASA: III  Anesthesia Plan: General   Post-op Pain Management:    Induction: Intravenous  PONV Risk Score and Plan: 3 and Midazolam, Dexamethasone, Ondansetron and Treatment may vary due to age or medical condition  Airway Management Planned: Oral ETT  Additional Equipment:   Intra-op Plan:   Post-operative Plan: Extubation in OR  Informed Consent: I have reviewed the patients History and Physical, chart, labs and discussed the procedure including the risks, benefits and alternatives for the proposed  anesthesia with the patient or authorized representative who has indicated his/her understanding and acceptance.     Dental advisory given  Plan Discussed with: CRNA and Surgeon  Anesthesia Plan Comments: (S/p bioprosthetic AVR 06/05/17. Postop afib treated with amiodarone. Echo 4/19 with EF 50 to 55%, grade 1 dd, bioprosthetic aortic valve with no insufficiency and mean gradient 13 mmHg, ascending aorta 39 mm, mild mitral regurgitation. Cardiac clearance 04/14/18 by Cecilie Kicks, NP.)      Anesthesia Quick Evaluation

## 2018-05-21 ENCOUNTER — Encounter (HOSPITAL_COMMUNITY): Payer: Self-pay | Admitting: Anesthesiology

## 2018-05-22 ENCOUNTER — Other Ambulatory Visit: Payer: Self-pay

## 2018-05-22 ENCOUNTER — Ambulatory Visit (HOSPITAL_COMMUNITY): Payer: 59 | Admitting: Anesthesiology

## 2018-05-22 ENCOUNTER — Encounter (HOSPITAL_COMMUNITY)
Admission: RE | Disposition: A | Discharge status: Home / Self Care | Payer: Self-pay | Source: Home / Self Care | Attending: Surgery

## 2018-05-22 ENCOUNTER — Ambulatory Visit (HOSPITAL_COMMUNITY): Payer: 59 | Admitting: Physician Assistant

## 2018-05-22 ENCOUNTER — Encounter (HOSPITAL_COMMUNITY): Payer: Self-pay | Admitting: Certified Registered Nurse Anesthetist

## 2018-05-22 ENCOUNTER — Observation Stay (HOSPITAL_COMMUNITY)
Admission: RE | Admit: 2018-05-22 | Discharge: 2018-05-23 | Disposition: A | Discharge status: Home / Self Care | Payer: 59 | Attending: Surgery | Admitting: Surgery

## 2018-05-22 DIAGNOSIS — E669 Obesity, unspecified: Secondary | ICD-10-CM | POA: Insufficient documentation

## 2018-05-22 DIAGNOSIS — G4730 Sleep apnea, unspecified: Secondary | ICD-10-CM | POA: Diagnosis not present

## 2018-05-22 DIAGNOSIS — Z87442 Personal history of urinary calculi: Secondary | ICD-10-CM | POA: Insufficient documentation

## 2018-05-22 DIAGNOSIS — K219 Gastro-esophageal reflux disease without esophagitis: Secondary | ICD-10-CM | POA: Insufficient documentation

## 2018-05-22 DIAGNOSIS — Z85820 Personal history of malignant melanoma of skin: Secondary | ICD-10-CM | POA: Diagnosis not present

## 2018-05-22 DIAGNOSIS — K5730 Diverticulosis of large intestine without perforation or abscess without bleeding: Secondary | ICD-10-CM | POA: Diagnosis not present

## 2018-05-22 DIAGNOSIS — E785 Hyperlipidemia, unspecified: Secondary | ICD-10-CM | POA: Insufficient documentation

## 2018-05-22 DIAGNOSIS — M549 Dorsalgia, unspecified: Secondary | ICD-10-CM | POA: Insufficient documentation

## 2018-05-22 DIAGNOSIS — I351 Nonrheumatic aortic (valve) insufficiency: Secondary | ICD-10-CM | POA: Diagnosis not present

## 2018-05-22 DIAGNOSIS — Z79899 Other long term (current) drug therapy: Secondary | ICD-10-CM | POA: Insufficient documentation

## 2018-05-22 DIAGNOSIS — Z809 Family history of malignant neoplasm, unspecified: Secondary | ICD-10-CM | POA: Insufficient documentation

## 2018-05-22 DIAGNOSIS — E7800 Pure hypercholesterolemia, unspecified: Secondary | ICD-10-CM | POA: Diagnosis not present

## 2018-05-22 DIAGNOSIS — I509 Heart failure, unspecified: Secondary | ICD-10-CM | POA: Insufficient documentation

## 2018-05-22 DIAGNOSIS — Z86010 Personal history of colonic polyps: Secondary | ICD-10-CM | POA: Diagnosis not present

## 2018-05-22 DIAGNOSIS — Z6831 Body mass index (BMI) 31.0-31.9, adult: Secondary | ICD-10-CM | POA: Diagnosis not present

## 2018-05-22 DIAGNOSIS — Z8349 Family history of other endocrine, nutritional and metabolic diseases: Secondary | ICD-10-CM | POA: Insufficient documentation

## 2018-05-22 DIAGNOSIS — K429 Umbilical hernia without obstruction or gangrene: Secondary | ICD-10-CM | POA: Diagnosis not present

## 2018-05-22 DIAGNOSIS — K2270 Barrett's esophagus without dysplasia: Secondary | ICD-10-CM | POA: Insufficient documentation

## 2018-05-22 DIAGNOSIS — D649 Anemia, unspecified: Secondary | ICD-10-CM | POA: Diagnosis not present

## 2018-05-22 DIAGNOSIS — Z9079 Acquired absence of other genital organ(s): Secondary | ICD-10-CM | POA: Diagnosis not present

## 2018-05-22 DIAGNOSIS — Z8261 Family history of arthritis: Secondary | ICD-10-CM | POA: Diagnosis not present

## 2018-05-22 DIAGNOSIS — Z800 Family history of malignant neoplasm of digestive organs: Secondary | ICD-10-CM | POA: Diagnosis not present

## 2018-05-22 DIAGNOSIS — Z8249 Family history of ischemic heart disease and other diseases of the circulatory system: Secondary | ICD-10-CM | POA: Insufficient documentation

## 2018-05-22 DIAGNOSIS — R0683 Snoring: Secondary | ICD-10-CM | POA: Insufficient documentation

## 2018-05-22 DIAGNOSIS — I110 Hypertensive heart disease with heart failure: Secondary | ICD-10-CM | POA: Diagnosis not present

## 2018-05-22 HISTORY — PX: UMBILICAL HERNIA REPAIR: SUR1181

## 2018-05-22 HISTORY — PX: UMBILICAL HERNIA REPAIR: SHX196

## 2018-05-22 LAB — CREATININE, SERUM
Creatinine, Ser: 1.11 mg/dL (ref 0.61–1.24)
GFR calc Af Amer: >60 mL/min (ref >60)
GFR calc non Af Amer: >60 mL/min (ref >60)

## 2018-05-22 LAB — CBC
HCT: 47.9 % (ref 39.0–52.0)
Hemoglobin: 15.6 g/dL (ref 13.0–17.0)
MCH: 28.7 pg (ref 26.0–34.0)
MCHC: 32.6 g/dL (ref 30.0–36.0)
MCV: 88.2 fL (ref 80.0–100.0)
Platelets: 196 10*3/uL (ref 150–400)
RBC: 5.43 MIL/uL (ref 4.22–5.81)
RDW: 13.2 % (ref 11.5–15.5)
WBC: 9.2 10*3/uL (ref 4.0–10.5)
nRBC: 0 % (ref 0.0–0.2)

## 2018-05-22 SURGERY — REPAIR, HERNIA, UMBILICAL, LAPAROSCOPIC
Anesthesia: General | Site: Abdomen

## 2018-05-22 MED ORDER — LORATADINE 10 MG PO TABS: 10.0000 mg | ORAL_TABLET | Freq: Every day | ORAL | Status: DC | PRN

## 2018-05-22 MED ORDER — BUPIVACAINE-EPINEPHRINE 0.25% -1:200000 IJ SOLN
INTRAMUSCULAR | Status: DC | PRN
  Administered 2018-05-22: 10 mL
  Administered 2018-05-22: 8 mL

## 2018-05-22 MED ORDER — FLUOROMETHOLONE 0.1 % OP SUSP: 1.0000 [drp] | Freq: Three times a day (TID) | OPHTHALMIC | Status: DC | PRN

## 2018-05-22 MED ORDER — HYDROCODONE-ACETAMINOPHEN 7.5-325 MG PO TABS: 1.0000 | ORAL_TABLET | Freq: Once | ORAL | Status: DC | PRN

## 2018-05-22 MED ORDER — SODIUM CHLORIDE 0.9 % IV SOLN
INTRAVENOUS | Status: DC
  Administered 2018-05-22 – 2018-05-23 (×2): via INTRAVENOUS

## 2018-05-22 MED ORDER — PROPOFOL 10 MG/ML IV BOLUS
INTRAVENOUS | Status: DC | PRN
  Administered 2018-05-22: 200 mg via INTRAVENOUS

## 2018-05-22 MED ORDER — MORPHINE SULFATE (PF) 2 MG/ML IV SOLN: 2.0000 mg | INTRAVENOUS | Status: DC | PRN

## 2018-05-22 MED ORDER — DIPHENHYDRAMINE HCL 50 MG/ML IJ SOLN: 12.5000 mg | Freq: Four times a day (QID) | INTRAMUSCULAR | Status: DC | PRN

## 2018-05-22 MED ORDER — CEFAZOLIN SODIUM-DEXTROSE 2-4 GM/100ML-% IV SOLN
2.0000 g | INTRAVENOUS | Status: AC
  Administered 2018-05-22: 2 g via INTRAVENOUS
  Filled 2018-05-22: qty 100

## 2018-05-22 MED ORDER — ATORVASTATIN CALCIUM 10 MG PO TABS
10.0000 mg | ORAL_TABLET | Freq: Every day | ORAL | Status: DC
  Administered 2018-05-22: 10 mg via ORAL
  Filled 2018-05-22: qty 1

## 2018-05-22 MED ORDER — PROPOFOL 10 MG/ML IV BOLUS
INTRAVENOUS | Status: AC
  Filled 2018-05-22: qty 20

## 2018-05-22 MED ORDER — GABAPENTIN 300 MG PO CAPS
300.0000 mg | ORAL_CAPSULE | ORAL | Status: AC
  Administered 2018-05-22: 300 mg via ORAL
  Filled 2018-05-22: qty 1

## 2018-05-22 MED ORDER — GABAPENTIN 300 MG PO CAPS
300.0000 mg | ORAL_CAPSULE | Freq: Two times a day (BID) | ORAL | Status: DC
  Administered 2018-05-22 (×2): 300 mg via ORAL
  Filled 2018-05-22 (×3): qty 1

## 2018-05-22 MED ORDER — ROCURONIUM BROMIDE 10 MG/ML (PF) SYRINGE
PREFILLED_SYRINGE | INTRAVENOUS | Status: DC | PRN
  Administered 2018-05-22: 50 mg via INTRAVENOUS
  Administered 2018-05-22: 10 mg via INTRAVENOUS

## 2018-05-22 MED ORDER — CHLORHEXIDINE GLUCONATE CLOTH 2 % EX PADS: 6.0000 | MEDICATED_PAD | Freq: Once | CUTANEOUS | Status: DC

## 2018-05-22 MED ORDER — TRAMADOL HCL 50 MG PO TABS: 50.0000 mg | ORAL_TABLET | Freq: Four times a day (QID) | ORAL | Status: DC | PRN

## 2018-05-22 MED ORDER — CEFAZOLIN SODIUM-DEXTROSE 2-4 GM/100ML-% IV SOLN
2.0000 g | Freq: Three times a day (TID) | INTRAVENOUS | Status: AC
  Administered 2018-05-22: 2 g via INTRAVENOUS
  Filled 2018-05-22 (×2): qty 100

## 2018-05-22 MED ORDER — PROMETHAZINE HCL 25 MG/ML IJ SOLN: 6.2500 mg | INTRAMUSCULAR | Status: DC | PRN

## 2018-05-22 MED ORDER — OXYCODONE HCL 5 MG PO TABS: 5.0000 mg | ORAL_TABLET | Freq: Four times a day (QID) | ORAL | Status: DC | PRN

## 2018-05-22 MED ORDER — DOCUSATE SODIUM 100 MG PO CAPS
100.0000 mg | ORAL_CAPSULE | Freq: Two times a day (BID) | ORAL | Status: DC
  Administered 2018-05-22 (×2): 100 mg via ORAL
  Filled 2018-05-22 (×3): qty 1

## 2018-05-22 MED ORDER — MEPERIDINE HCL 50 MG/ML IJ SOLN: 6.2500 mg | INTRAMUSCULAR | Status: DC | PRN

## 2018-05-22 MED ORDER — ONDANSETRON HCL 4 MG/2ML IJ SOLN
INTRAMUSCULAR | Status: DC | PRN
  Administered 2018-05-22: 4 mg via INTRAVENOUS

## 2018-05-22 MED ORDER — LACTATED RINGERS IV SOLN
INTRAVENOUS | Status: DC | PRN
  Administered 2018-05-22: 07:00:00 via INTRAVENOUS

## 2018-05-22 MED ORDER — ENOXAPARIN SODIUM 40 MG/0.4ML ~~LOC~~ SOLN
40.0000 mg | SUBCUTANEOUS | Status: DC
  Filled 2018-05-22: qty 0.4

## 2018-05-22 MED ORDER — SIMETHICONE 80 MG PO CHEW: 40.0000 mg | CHEWABLE_TABLET | Freq: Four times a day (QID) | ORAL | Status: DC | PRN

## 2018-05-22 MED ORDER — OXYCODONE HCL 5 MG PO TABS: 5.0000 mg | ORAL_TABLET | Freq: Four times a day (QID) | ORAL | 0 refills | Status: DC | PRN

## 2018-05-22 MED ORDER — SODIUM CHLORIDE 0.9 % IV SOLN
INTRAVENOUS | Status: DC | PRN
  Administered 2018-05-22: 30 ug/min via INTRAVENOUS

## 2018-05-22 MED ORDER — FENTANYL CITRATE (PF) 250 MCG/5ML IJ SOLN
INTRAMUSCULAR | Status: DC | PRN
  Administered 2018-05-22: 50 ug via INTRAVENOUS
  Administered 2018-05-22: 150 ug via INTRAVENOUS

## 2018-05-22 MED ORDER — PHENYLEPHRINE 40 MCG/ML (10ML) SYRINGE FOR IV PUSH (FOR BLOOD PRESSURE SUPPORT)
PREFILLED_SYRINGE | INTRAVENOUS | Status: DC | PRN
  Administered 2018-05-22 (×2): 80 ug via INTRAVENOUS
  Administered 2018-05-22: 120 ug via INTRAVENOUS

## 2018-05-22 MED ORDER — BUPIVACAINE-EPINEPHRINE (PF) 0.25% -1:200000 IJ SOLN
INTRAMUSCULAR | Status: AC
  Filled 2018-05-22: qty 30

## 2018-05-22 MED ORDER — ACETAMINOPHEN 500 MG PO TABS
1000.0000 mg | ORAL_TABLET | ORAL | Status: AC
  Administered 2018-05-22: 1000 mg via ORAL
  Filled 2018-05-22: qty 2

## 2018-05-22 MED ORDER — ONDANSETRON 4 MG PO TBDP: 4.0000 mg | ORAL_TABLET | Freq: Four times a day (QID) | ORAL | Status: DC | PRN

## 2018-05-22 MED ORDER — SUGAMMADEX SODIUM 200 MG/2ML IV SOLN
INTRAVENOUS | Status: DC | PRN
  Administered 2018-05-22: 200 mg via INTRAVENOUS

## 2018-05-22 MED ORDER — HYDROMORPHONE HCL 1 MG/ML IJ SOLN: 0.2500 mg | INTRAMUSCULAR | Status: DC | PRN

## 2018-05-22 MED ORDER — 0.9 % SODIUM CHLORIDE (POUR BTL) OPTIME
TOPICAL | Status: DC | PRN
  Administered 2018-05-22: 1000 mL

## 2018-05-22 MED ORDER — DIPHENHYDRAMINE HCL 12.5 MG/5ML PO ELIX: 12.5000 mg | ORAL_SOLUTION | Freq: Four times a day (QID) | ORAL | Status: DC | PRN

## 2018-05-22 MED ORDER — MIDAZOLAM HCL 2 MG/2ML IJ SOLN
INTRAMUSCULAR | Status: AC
  Filled 2018-05-22: qty 2

## 2018-05-22 MED ORDER — SODIUM CHLORIDE 0.9 % IR SOLN
Status: DC | PRN
  Administered 2018-05-22: 1000 mL

## 2018-05-22 MED ORDER — ONDANSETRON HCL 4 MG/2ML IJ SOLN: 4.0000 mg | Freq: Four times a day (QID) | INTRAMUSCULAR | Status: DC | PRN

## 2018-05-22 MED ORDER — DEXAMETHASONE SODIUM PHOSPHATE 10 MG/ML IJ SOLN
INTRAMUSCULAR | Status: DC | PRN
  Administered 2018-05-22: 10 mg via INTRAVENOUS

## 2018-05-22 MED ORDER — FENTANYL CITRATE (PF) 250 MCG/5ML IJ SOLN
INTRAMUSCULAR | Status: AC
  Filled 2018-05-22: qty 5

## 2018-05-22 MED ORDER — LIDOCAINE 2% (20 MG/ML) 5 ML SYRINGE
INTRAMUSCULAR | Status: DC | PRN
  Administered 2018-05-22: 100 mg via INTRAVENOUS

## 2018-05-22 MED ORDER — MIDAZOLAM HCL 2 MG/2ML IJ SOLN
INTRAMUSCULAR | Status: DC | PRN
  Administered 2018-05-22 (×2): 1 mg via INTRAVENOUS

## 2018-05-22 MED ORDER — KETOROLAC TROMETHAMINE 30 MG/ML IJ SOLN
30.0000 mg | Freq: Four times a day (QID) | INTRAMUSCULAR | Status: DC
  Administered 2018-05-22 – 2018-05-23 (×4): 30 mg via INTRAVENOUS
  Filled 2018-05-22 (×4): qty 1

## 2018-05-22 MED ORDER — METOPROLOL TARTRATE 12.5 MG HALF TABLET
12.5000 mg | ORAL_TABLET | Freq: Two times a day (BID) | ORAL | Status: DC
  Administered 2018-05-22: 12.5 mg via ORAL
  Filled 2018-05-22 (×3): qty 1

## 2018-05-22 MED ORDER — METHOCARBAMOL 500 MG PO TABS: 500.0000 mg | ORAL_TABLET | Freq: Four times a day (QID) | ORAL | Status: DC | PRN

## 2018-05-22 SURGICAL SUPPLY — 44 items
APPLIER CLIP 5 13 M/L LIGAMAX5 (MISCELLANEOUS)
APPLIER CLIP LOGIC TI 5 (MISCELLANEOUS) IMPLANT
BENZOIN TINCTURE PRP APPL 2/3 (GAUZE/BANDAGES/DRESSINGS) IMPLANT
BLADE CLIPPER SURG (BLADE) ×2 IMPLANT
CANISTER SUCT 3000ML PPV (MISCELLANEOUS) IMPLANT
CHLORAPREP W/TINT 26ML (MISCELLANEOUS) ×2 IMPLANT
CLIP APPLIE 5 13 M/L LIGAMAX5 (MISCELLANEOUS) IMPLANT
COVER SURGICAL LIGHT HANDLE (MISCELLANEOUS) ×2 IMPLANT
COVER WAND RF STERILE (DRAPES) IMPLANT
DECANTER SPIKE VIAL GLASS SM (MISCELLANEOUS) ×2 IMPLANT
DEVICE RELIATACK FIXATION (MISCELLANEOUS) IMPLANT
DEVICE TROCAR PUNCTURE CLOSURE (ENDOMECHANICALS) ×2 IMPLANT
ELECT REM PT RETURN 9FT ADLT (ELECTROSURGICAL) ×2
ELECTRODE REM PT RTRN 9FT ADLT (ELECTROSURGICAL) ×1 IMPLANT
FILTER SMOKE EVAC LAPAROSHD (FILTER) IMPLANT
GLOVE BIO SURGEON STRL SZ7 (GLOVE) ×2 IMPLANT
GLOVE BIOGEL PI IND STRL 7.5 (GLOVE) ×1 IMPLANT
GLOVE BIOGEL PI INDICATOR 7.5 (GLOVE) ×1
GOWN STRL REUS W/ TWL LRG LVL3 (GOWN DISPOSABLE) ×2 IMPLANT
GOWN STRL REUS W/TWL LRG LVL3 (GOWN DISPOSABLE) ×2
KIT BASIN OR (CUSTOM PROCEDURE TRAY) ×2 IMPLANT
KIT TURNOVER KIT B (KITS) ×2 IMPLANT
MARKER SKIN DUAL TIP RULER LAB (MISCELLANEOUS) ×2 IMPLANT
MESH VENTRALIGHT ST 4.5IN (Mesh General) ×2 IMPLANT
NEEDLE SPNL 22GX3.5 QUINCKE BK (NEEDLE) IMPLANT
NS IRRIG 1000ML POUR BTL (IV SOLUTION) ×2 IMPLANT
PAD ARMBOARD 7.5X6 YLW CONV (MISCELLANEOUS) ×4 IMPLANT
RELOAD RELIATACK 10 (MISCELLANEOUS) ×2 IMPLANT
RELOAD RELIATACK 5 (MISCELLANEOUS) ×4 IMPLANT
SCISSORS LAP 5X35 DISP (ENDOMECHANICALS) ×2 IMPLANT
SET IRRIG TUBING LAPAROSCOPIC (IRRIGATION / IRRIGATOR) IMPLANT
SET TUBE SMOKE EVAC HIGH FLOW (TUBING) IMPLANT
SHEARS HARMONIC ACE PLUS 36CM (ENDOMECHANICALS) IMPLANT
SLEEVE ENDOPATH XCEL 5M (ENDOMECHANICALS) ×2 IMPLANT
SUT MNCRL AB 4-0 PS2 18 (SUTURE) ×2 IMPLANT
SUT NOVA 0 T19/GS 22DT (SUTURE) ×2 IMPLANT
TOWEL OR 17X24 6PK STRL BLUE (TOWEL DISPOSABLE) ×2 IMPLANT
TOWEL OR 17X26 10 PK STRL BLUE (TOWEL DISPOSABLE) ×2 IMPLANT
TRAY FOLEY MTR SLVR 14FR STAT (SET/KITS/TRAYS/PACK) IMPLANT
TRAY LAPAROSCOPIC MC (CUSTOM PROCEDURE TRAY) ×2 IMPLANT
TROCAR XCEL BLUNT TIP 100MML (ENDOMECHANICALS) IMPLANT
TROCAR XCEL NON-BLD 11X100MML (ENDOMECHANICALS) ×2 IMPLANT
TROCAR XCEL NON-BLD 5MMX100MML (ENDOMECHANICALS) ×2 IMPLANT
WATER STERILE IRR 1000ML POUR (IV SOLUTION) ×2 IMPLANT

## 2018-05-22 NOTE — Anesthesia Procedure Notes (Signed)
Procedure Name: Intubation Date/Time: 05/22/2018 7:53 AM Performed by: Julieta Bellini, CRNA Pre-anesthesia Checklist: Patient identified, Emergency Drugs available, Suction available and Patient being monitored Patient Re-evaluated:Patient Re-evaluated prior to induction Oxygen Delivery Method: Circle system utilized Preoxygenation: Pre-oxygenation with 100% oxygen Induction Type: IV induction Ventilation: Mask ventilation without difficulty and Oral airway inserted - appropriate to patient size Laryngoscope Size: Mac and 4 Grade View: Grade I Tube type: Oral Number of attempts: 1 Airway Equipment and Method: Stylet Placement Confirmation: ETT inserted through vocal cords under direct vision,  positive ETCO2 and breath sounds checked- equal and bilateral Secured at: 22 cm Tube secured with: Tape Dental Injury: Teeth and Oropharynx as per pre-operative assessment

## 2018-05-22 NOTE — Anesthesia Postprocedure Evaluation (Signed)
Anesthesia Post Note  Patient: [name] Anderson  Procedure(s) Performed: LAPAROSCOPIC REPAIR OF RECURRENT UMBILICAL HERNIA WITH MESH (N/A Abdomen)     Patient location during evaluation: PACU Anesthesia Type: General Level of consciousness: awake and alert and oriented Pain management: pain level controlled Vital Signs Assessment: post-procedure vital signs reviewed and stable Respiratory status: spontaneous breathing, nonlabored ventilation and respiratory function stable Cardiovascular status: blood pressure returned to baseline and stable Postop Assessment: no apparent nausea or vomiting Anesthetic complications: no    Last Vitals:  Vitals:   05/22/18 0905 05/22/18 0920  BP: 97/79 107/79  Pulse: 83 81  Resp: 13 (!) 22  Temp:    SpO2: 97% 94%    Last Pain:  Vitals:   05/22/18 0920  TempSrc:   PainSc: Asleep                 Mirranda Monrroy A.

## 2018-05-22 NOTE — Op Note (Signed)
Laparoscopic Recurrent Umbilical Hernia Repair Procedure Note  Indications: Symptomatic Recurrent Umbilical hernia  Pre-operative Diagnosis: Symptomatic Recurrent Umbilical hernia  Post-operative Diagnosis: Symptomatic Recurrent Umbilical hernia  Surgeon: Maia Petties   Assistants: Sharyn Dross, RNFA  Anesthesia: General endotracheal anesthesia  ASA Class: 2  Procedure Details  The patient was seen in the Holding Room. The risks, benefits, complications, treatment options, and expected outcomes were discussed with the patient. The possibilities of reaction to medication, pulmonary aspiration, perforation of viscus, bleeding, recurrent infection, the need for additional procedures, failure to diagnose a condition, and creating a complication requiring transfusion or operation were discussed with the patient. The patient concurred with the proposed plan, giving informed consent.  The site of surgery properly noted/marked. The patient was taken to the operating room, identified as [name] Anderson and the procedure verified as laparoscopic ventral hernia repair with mesh. A Time Out was held and the above information confirmed.    The patient was placed supine.  After establishing general anesthesia, his abdomen was shaved.   The abdomen was prepped with Chloraprep and draped in standard fashion.  A 5 mm Optiview was used the cannulate the peritoneal cavity in the left upper quadrant below the costal margin.  Pneumoperitoneum was obtained by insufflating CO2, maintaining a maximum pressure of 15 mmHg.  The 5 mm 30-degree laparoscope was inserted.  There were some omental adhesions to the anterior abdominal wall in and around the hernia defect.  An 11-mm port was placed in the left anterior axillary line at the level of the umbilicus.  Another 5-mm port was placed in the left lower quadrant.  Cautery scissors and gentle traction were used to dissect the omental adhesions away from the anterior  abdominal wall.  We reduced some omentum from a small 1 cm defect to the left of the previous hernia mesh.  The old hernia mesh appears to be in good place behind the umbilicus.  We cleared the entire abdominal wall and were able to visualize only the one fascial defect.  We selected a round 11 cm piece of Bard Ventralight mesh.  We placed four stay sutures of 0 Novofil around the edges of the mesh.  The mesh was then rolled up and inserted through the 11 mm port site.  The mesh was then unrolled.  The stay sutures were then pulled up through small stab incisions using the Endo-close device.  This deployed the mesh widely over the fascial defects.  The stay sutures were then tied down.  The Reliatack device was then used to tack down the edges of the mesh at 1 cm intervals circumferentially.  We placed a few tacks inside the outer ring of tacks.  We inspected for hemostasis.  The fascial defect at the 11 mm port site was closed with a 0 Vicryl using the Endoclose device.  Pneumoperitoneum was then released as we removed the remainder of the trocars.  The port sites were closed with 4-0 Monocryl.  All of the incisions and stay suture sites were then sealed with Dermabond.  An abdominal binder was placed around the patient's abdomen.  The patient was extubated and brought to the recovery room in stable condition.  All sponge, instrument, and needle counts were correct prior to closure and at the conclusion of the case.   Findings: 1 cm defect to the left of the previous hernia mesh  Estimated Blood Loss:  less than 50 mL         Complications:  None; patient tolerated the procedure well.         Disposition: PACU - hemodynamically stable.         Condition: stable  Imogene Burn. Georgette Dover, MD, [facility]  General/ Trauma Surgery Beeper [phone]  05/22/2018 8:36 AM

## 2018-05-22 NOTE — Discharge Instructions (Signed)
[facility] _______[facility]  UMBILICALHERNIA REPAIR: POST OP INSTRUCTIONS  Always review your discharge instruction sheet given to you by the facility where your surgery was performed. IF YOU HAVE DISABILITY OR FAMILY LEAVE FORMS, YOU MUST BRING THEM TO THE OFFICE FOR PROCESSING.   DO NOT GIVE THEM TO YOUR DOCTOR.  1. A  prescription for pain medication may be given to you upon discharge.  Take your pain medication as prescribed, if needed.  If narcotic pain medicine is not needed, then you may take acetaminophen (Tylenol) or ibuprofen (Advil) as needed. 2. Take your usually prescribed medications unless otherwise directed. If you need a refill on your pain medication, please contact your pharmacy.  They will contact our office to request authorization. Prescriptions will not be filled after 5 pm or on week-ends. 3. You should follow a light diet the first 24 hours after arrival home, such as soup and crackers, etc.  Be sure to include lots of fluids daily.  Resume your normal diet the day after surgery. 4.Most patients will experience some swelling and bruising around the umbilicus.  Ice packs and reclining will help.  Swelling and bruising can take several days to resolve.  6. It is common to experience some constipation if taking pain medication after surgery.  Increasing fluid intake and taking a stool softener (such as Colace) will usually help or prevent this problem from occurring.  A mild laxative (Milk of Magnesia or Miralax) should be taken according to package directions if there are no bowel movements after 48 hours. 7. Unless discharge instructions indicate otherwise, you may shower in 24 hours.  The glue will flake off over the next 2-3 weeks.  8. ACTIVITIES:  You may resume regular (light) daily activities beginning the next day--such as daily self-care, walking, climbing stairs--gradually increasing activities as tolerated.  You may have sexual intercourse when it is  comfortable.  Refrain from any heavy lifting or straining until approved by your doctor.  a.You may drive when you are no longer taking prescription pain medication, you can comfortably wear a seatbelt, and you can safely maneuver your car and apply brakes. b.RETURN TO WORK:   _____________________________________________  9.You should see your doctor in the office for a follow-up appointment approximately 2-3 weeks after your surgery.  Make sure that you call for this appointment within a day or two after you arrive home to insure a convenient appointment time. 10.OTHER INSTRUCTIONS: _________________________    _____________________________________  WHEN TO CALL YOUR DOCTOR: 1. Fever over 101.0 2. Inability to urinate 3. Nausea and/or vomiting 4. Extreme swelling or bruising 5. Continued bleeding from incision. 6. Increased pain, redness, or drainage from the incision  The clinic staff is available to answer your questions during regular business hours.  Please dont hesitate to call and ask to speak to one of the nurses for clinical concerns.  If you have a medical emergency, go to the nearest emergency room or call 911.  A surgeon from [facility] is always on call at the hospital   [address] [place], [place], [place]  [address] ?  [address], [place], [place]   [address] [phone] ? [phone] ? FAX [phone] Web site: [email or web address]

## 2018-05-22 NOTE — Progress Notes (Signed)
Rt note Patient placed on cpap for QHS no distress tolerating well using home nasal prong all safety checked will continue to monitor

## 2018-05-22 NOTE — H&P (Signed)
History of Present Illness  The patient is a 63 year old male who presents with an umbilical hernia. Referred by Dr. Antony Contras for umbilical hernia. Cardiology - [facility] - Servando Snare  This is a 63 year old male who is status post open repair of umbilical hernia in 2841 by Dr. Dalbert Batman. He had a defect that was repaired with a 6.4 cm proceed ventral patch. This was secured with Novofil sutures. 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 He had a part-time job over the last several years it did involve some heavy lifting. The patient had heart valve replacement in February 2019 and has not returned to work. About 3 months ago, he began noticing some swelling and a palpable mass to the left and above his umbilicus. This fluctuates in size. He denies any changes in bowel movements. He mentioned this to Dr. Moreen Fowler who ordered an ultrasound. This showed a left periumbilical hernia with a 9 mm defect and a 3.2 x 2.1 x 4.1 cm hernia sac.   CLINICAL DATA: Soft tissue mass superior and to the left of the umbilical region for the past 2 months. Possible hernia.  EXAM: ULTRASOUND ABDOMEN LIMITED  COMPARISON: None.  FINDINGS: The palpable finding is just superior to the left of the umbilicus and measures 3.2 x 2.1 x 4.1 cm. It appears to communicate with the intraperitoneal cavity through a neck measuring 9 mm in diameter. With Valsalva abdominal contents are seen to move into an hour of the hernia but these abdominal contents do not completely retract.  IMPRESSION: Left periumbilical hernia containing peritoneal fat with some change in size with Valsalva.   Electronically Signed By: David Martinique M.D. On: 03/11/2018 10:07   Past Surgical History  Colon Polyp Removal - Colonoscopy Valve Replacement  Diagnostic Studies History  Colonoscopy within last year  Allergies No Known Drug Allergies Allergies  Reconciled  Medication History  Atorvastatin Calcium (10MG  Tablet, Oral) Active. Losartan Potassium (50MG  Tablet, Oral) Active. Metoprolol Tartrate (25MG  Tablet, Oral) Active. Shingrix (50MCG/0.5ML For Suspension, Intramuscular) Active. Medications Reconciled  Social History  Alcohol use Moderate alcohol use. Caffeine use Coffee. No drug use Tobacco use Current some day smoker.  Family History Arthritis Mother. Cancer Mother. Colon Cancer Father. Hypertension Mother. Thyroid problems Mother.  Other Problems  Back Pain Cancer Congestive Heart Failure High blood pressure Hypercholesterolemia Kidney Stone Melanoma Other disease, cancer, significant illness Umbilical Hernia Repair     Review of Systems  General Not Present- Appetite Loss, Chills, Fatigue, Fever, Night Sweats, Weight Gain and Weight Loss. Skin Not Present- Change in Wart/Mole, Dryness, Hives, Jaundice, New Lesions, Non-Healing Wounds, Rash and Ulcer. HEENT Present- Seasonal Allergies and Wears glasses/contact lenses. Not Present- Earache, Hearing Loss, Hoarseness, Nose Bleed, Oral Ulcers, Ringing in the Ears, Sinus Pain, Sore Throat, Visual Disturbances and Yellow Eyes. Respiratory Not Present- Bloody sputum, Chronic Cough, Difficulty Breathing, Snoring and Wheezing. Breast Not Present- Breast Mass, Breast Pain, Nipple Discharge and Skin Changes. Cardiovascular Not Present- Chest Pain, Difficulty Breathing Lying Down, Leg Cramps, Palpitations, Rapid Heart Rate, Shortness of Breath and Swelling of Extremities. Gastrointestinal Not Present- Abdominal Pain, Bloating, Bloody Stool, Change in Bowel Habits, Chronic diarrhea, Constipation, Difficulty Swallowing, Excessive gas, Gets full quickly at meals, Hemorrhoids, Indigestion, Nausea, Rectal Pain and Vomiting. Male Genitourinary Not Present- Blood in Urine, Change in Urinary Stream, Frequency, Impotence, Nocturia, Painful Urination,  Urgency and Urine Leakage. Musculoskeletal Present- Joint Pain. Not Present- Back Pain, Joint Stiffness, Muscle Pain, Muscle Weakness  and Swelling of Extremities. Neurological Not Present- Decreased Memory, Fainting, Headaches, Numbness, Seizures, Tingling, Tremor, Trouble walking and Weakness. Psychiatric Not Present- Anxiety, Bipolar, Change in Sleep Pattern, Depression, Fearful and Frequent crying. Endocrine Not Present- Cold Intolerance, Excessive Hunger, Hair Changes, Heat Intolerance, Hot flashes and New Diabetes.  Vitals  Weight: 224 lb Height: 70in Body Surface Area: 2.19 m Body Mass Index: 32.14 kg/m  Temp.: 98.69F  Pulse: 60 (Regular)  BP: 112/78 (Sitting, Left Arm, Standard)      Physical Exam   The physical exam findings are as follows: Note:WDWN in NAD Eyes: Pupils equal, round; sclera anicteric HENT: Oral mucosa moist; good dentition Neck: No masses palpated, no thyromegaly Lungs: CTA bilaterally; normal respiratory effort Chest: healed median sternotomy scar CV: Regular rate and rhythm; no murmurs; extremities well-perfused with no edema Abd: +bowel sounds, soft, non-tender, no palpable organomegaly; healed infraumbilical incision. About 3 cm above the umbilicus and 2 cm to the left of midline there is a palpable mass. This enlarges significantly to about 4 cm with Valsalva maneuver. It is partially reducible. No overlying skin changes. Skin: Warm, dry; no sign of jaundice Psychiatric - alert and oriented x 4; calm mood and affect    Assessment & Plan   RECURRENT UMBILICAL HERNIA (G40.1)  Current Plans Schedule for Surgery - Laparoscopic repair of recurrent umbilical hernia with mesh. The surgical procedure has been discussed with the patient. Potential risks, benefits, alternative treatments, and expected outcomes have been explained. All of the patient's questions at this time have been answered. The likelihood of reaching the  patient's treatment goal is good. The patient understand the proposed surgical procedure and wishes to proceed.  We will schedule his procedure at [facility] and keep him overnight because of his recent cardiac surgery.  Note:He seems to have developed a recurrent hernia around one of the stay sutures. I would recommend a laparoscopic approach to this hernia repair in order to examine all of the old mesh. We will likely put a new piece of mesh over this area.  Imogene Burn. Georgette Dover, MD, [facility]  General/ Trauma Surgery Beeper [phone]  05/22/2018 7:14 AM

## 2018-05-22 NOTE — Transfer of Care (Signed)
Immediate Anesthesia Transfer of Care Note  Patient: [name] Anderson  Procedure(s) Performed: LAPAROSCOPIC REPAIR OF RECURRENT UMBILICAL HERNIA WITH MESH (N/A Abdomen)  Patient Location: PACU  Anesthesia Type:General  Level of Consciousness: drowsy and patient cooperative  Airway & Oxygen Therapy: Patient Spontanous Breathing  Post-op Assessment: Report given to RN, Post -op Vital signs reviewed and stable and Patient moving all extremities X 4  Post vital signs: Reviewed and stable  Last Vitals:  Vitals Value Taken Time  BP 117/78 05/22/2018  8:50 AM  Temp    Pulse 82 05/22/2018  8:52 AM  Resp 22 05/22/2018  8:52 AM  SpO2 93 % 05/22/2018  8:52 AM  Vitals shown include unvalidated device data.  Last Pain:  Vitals:   05/22/18 1975  TempSrc:   PainSc: 0-No pain      Patients Stated Pain Goal: 2 (88/32/54 9826)  Complications: No apparent anesthesia complications

## 2018-05-23 ENCOUNTER — Encounter (HOSPITAL_COMMUNITY): Payer: Self-pay | Admitting: Surgery

## 2018-05-23 DIAGNOSIS — K429 Umbilical hernia without obstruction or gangrene: Secondary | ICD-10-CM | POA: Diagnosis not present

## 2018-05-23 NOTE — Discharge Summary (Signed)
[name] Anderson Discharge Summary   Patient ID: [name] Anderson MRN: [id number] DOB/AGE: 63-May-1957 69 y.o.  Admit date: 05/22/2018 Discharge date: 05/23/2018  Admitting Diagnosis: Recurrent umbilical hernia  Discharge Diagnosis Patient Active Problem List   Diagnosis Date Noted  . Recurrent umbilical hernia 92/02/9416  . S/P AVR 06/05/2017  . Severe aortic insufficiency 04/17/2017  . Chills   . Fatigue   . 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 Barrett's esophagus - short segment 03/13/2017  . 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 Pulmonary nodules 09/28/2013  . Aortic root dilatation ([place]) 03/30/2013  . Physical exam, annual 02/11/2013  . Diverticulosis of colon without hemorrhage 02/11/2013  . Basal cell carcinoma of skin 02/11/2013  . Personal history of colonic polyps   . HYPERCHOLESTEROLEMIA 02/17/2009  . AVD (aortic valve disease) 02/17/2009  . UMBILICAL HERNIA 40/81/4481  . NEPHROLITHIASIS 02/17/2009  . SNORING 02/17/2009    Consultants None  Imaging: No results found.  Procedures Dr. Georgette Dover (05/22/18) - Laparoscopic recurrent umbilical hernia repair  Hospital Course:  Patient is a 63 year old male who presented to [facility] OR for repair of recurrent umbilical hernia.  Patient was admitted and underwent procedure listed above.  Tolerated procedure well and was transferred to the floor.  Diet was advanced as tolerated.  On POD#1, the patient was voiding well, tolerating diet, ambulating well, pain well controlled, vital signs stable, incisions c/d/i and felt stable for discharge home.  Patient will follow up in our office in 2-3 weeks and knows to call with questions or concerns.  He will call to confirm appointment date/time.    Physical Exam: General:  Alert, NAD, pleasant, comfortable Abd:  Soft, ND, mild tenderness, incisions C/D/I  Allergies as of 05/23/2018   No Known Allergies     Medication List    TAKE these medications    acetaminophen 500 MG tablet Commonly known as:  TYLENOL Take 1 tablet (500 mg total) by mouth every 6 (six) hours as needed (for pain.).   aspirin EC 81 MG tablet Take 81 mg by mouth daily.   atorvastatin 10 MG tablet Commonly known as:  LIPITOR Take 1 tablet (10 mg total) by mouth daily at 6 PM.   betamethasone valerate 0.1 % cream Commonly known as:  VALISONE Apply 1 application topically 2 (two) times daily as needed (for skin irritation.).   cetirizine 10 MG tablet Commonly known as:  ZYRTEC Take 10 mg by mouth daily as needed for allergies.   COSAMIN DS PO Take 1 tablet by mouth 2 (two) times daily.   ECHINACEA PO Take 760 mg by mouth daily.   fluorometholone 0.1 % ophthalmic suspension Commonly known as:  FML Place 1 drop into both eyes 3 (three) times daily as needed (for irritated/itchy eyes.).   fluticasone 50 MCG/ACT nasal spray Commonly known as:  FLONASE Place 1 spray into both nostrils at bedtime as needed for allergies or rhinitis.   HYPERCARE 20 % external solution Generic drug:  aluminum chloride Apply 1 application topically 3 (three) times daily as needed (for sweating.).   loratadine 10 MG tablet Commonly known as:  CLARITIN Take 10 mg by mouth daily as needed for allergies.   losartan 50 MG tablet Commonly known as:  COZAAR Take 1 tablet (50 mg total) by mouth daily.   metoprolol tartrate 25 MG tablet Commonly known as:  LOPRESSOR Take 0.5 tablets (12.5 mg total) by mouth 2 (two) times daily.   NON FORMULARY Take 2  capsules by mouth 2 (two) times daily. DoTerra (Microplex VMz) 2 Capsule twice daily   NON FORMULARY Take 2 capsules by mouth 2 (two) times daily. XE0 Mega "Doterra"   OPCON-A 0.027-0.315 % Soln Generic drug:  Naphazoline-Pheniramine Place 1-2 drops into both eyes 3 (three) times daily as needed (for allergy eyes.).   oxyCODONE 5 MG immediate release tablet Commonly known as:  Oxy IR/ROXICODONE Take 1 tablet (5 mg total) by  mouth every 6 (six) hours as needed for moderate pain.   sildenafil 20 MG tablet Commonly known as:  REVATIO Take 20 mg by mouth daily as needed (for ED).   SUPER B COMPLEX/C PO Take 1 tablet by mouth daily.   triamcinolone cream 0.1 % Commonly known as:  KENALOG Apply 1 application topically daily as needed (FOR DRY SKIN/ITCHY SKIN.). Apply to area after bath (DO NOT APPLY TO FACE)   vitamin C 1000 MG tablet Take 1,000 mg by mouth daily.        Follow-up Information    Donnie Mesa, MD.   Specialty:  General Anderson Contact information: [address] [place] [address] [phone]           Signed: Brigid Re, [facility] Anderson 05/23/2018, 8:01 AM Pager: [phone] Consults: [phone] Mon-Fri 7:00 am-4:30 pm Sat-Sun 7:00 am-11:30 am

## 2018-05-23 NOTE — Progress Notes (Signed)
[name] to be D/C'd  per MD order. Discussed with the patient and all questions fully answered.  VSS, Skin clean, dry and intact without evidence of skin break down, no evidence of skin tears noted.  IV catheter discontinued intact. Site without signs and symptoms of complications. Dressing and pressure applied.  An After Visit Summary was printed and given to the patient. Patient received prescription.  D/c education completed with patient/family including follow up instructions, medication list, d/c activities limitations if indicated, with other d/c instructions as indicated by MD - patient able to verbalize understanding, all questions fully answered.   Patient instructed to return to ED, call 911, or call MD for any changes in condition.   Patient to be escorted via [place], and D/C home via private auto.

## 2018-05-27 ENCOUNTER — Encounter: Payer: Self-pay | Admitting: Cardiothoracic Surgery

## 2018-06-05 ENCOUNTER — Ambulatory Visit
Admission: RE | Admit: 2018-06-05 | Discharge: 2018-06-05 | Disposition: A | Discharge status: Home / Self Care | Payer: 59 | Source: Ambulatory Visit | Attending: Cardiothoracic Surgery | Admitting: Cardiothoracic Surgery

## 2018-06-05 ENCOUNTER — Other Ambulatory Visit: Payer: Self-pay

## 2018-06-05 ENCOUNTER — Ambulatory Visit: Payer: 59 | Admitting: Cardiothoracic Surgery

## 2018-06-05 ENCOUNTER — Encounter: Payer: Self-pay | Admitting: Cardiothoracic Surgery

## 2018-06-05 VITALS — BP 115/83 | HR 77 | Resp 18 | Ht 70.0 in | Wt 225.0 lb

## 2018-06-05 DIAGNOSIS — I77810 Thoracic aortic ectasia: Secondary | ICD-10-CM

## 2018-06-05 DIAGNOSIS — Z952 Presence of prosthetic heart valve: Secondary | ICD-10-CM

## 2018-06-05 DIAGNOSIS — I719 Aortic aneurysm of unspecified site, without rupture: Secondary | ICD-10-CM | POA: Diagnosis not present

## 2018-06-05 MED ORDER — IOPAMIDOL (ISOVUE-370) INJECTION 76%
75.0000 mL | Freq: Once | INTRAVENOUS | Status: AC | PRN
  Administered 2018-06-05: 75 mL via INTRAVENOUS

## 2018-06-05 NOTE — Progress Notes (Signed)
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 PROCEDURE:  Aortic valve replacement with pericardial tissue valve Edwards [employer]iences, model 3300TFX, 25 mm, serial #[id number]. SURGEON:  Lanelle Bal, M.D.  History of Present Illness:     Patient returns to the office today for postop check following aortic valve replacement for severe aortic insufficiency June 05, 2017.  The patient had had a previous history of aortic insufficiency, in the fall 2018 he had sudden increase in the degree of insufficiency and was found to have endocarditis he was treated with 8 weeks of IV antibiotics and underwent aortic valve replacement in February 2019.  Postop follow-up echocardiogram done April 2019 shows good functioning of his aortic valve aortic root measured it 3.9 cm.   Patient continues to do well postoperatively without evidence of recurrent heart failure or endocarditis.  He is now exactly 1 year postop.  He did require a umbilical hernia repair recently.    Zubrod Score: At the time of surgery this patient's most appropriate activity status/level should be described as: [x]     0    Normal activity, no symptoms []     1    Restricted in physical strenuous activity but ambulatory, able to do out light  work []     2    Ambulatory and capable of self care, unable to do work activities, up and about                 >50 % of waking hours                                                                                   []     3    Only limited self care, in bed greater than 50% of waking hours []     4    Completely disabled, no self care, confined to bed or chair []     5    Moribund  Social History   Tobacco Use  Smoking Status Current Some Day Smoker  . Types: Pipe  Smokeless Tobacco Former Systems developer  . Types: Chew  . Quit date: 04/26/2011  Tobacco Comment   OCCASIONAL  PIPE SMOKER  (NEVER SMOKED CIGARETTES)       No Known Allergies  Current Outpatient Medications  Medication Sig Dispense Refill  . acetaminophen (TYLENOL) 500 MG tablet Take 1 tablet (500 mg total) by mouth every 6 (  six) hours as needed (for pain.). 30 tablet 0  . aluminum chloride (HYPERCARE) 20 % external solution Apply 1 application topically 3 (three) times daily as needed (for sweating.).    Marland Kitchen Ascorbic Acid (VITAMIN C) 1000 MG tablet Take 1,000 mg by mouth daily.    Marland Kitchen aspirin EC 81 MG tablet Take 81 mg by mouth daily.    Marland Kitchen atorvastatin (LIPITOR) 10 MG tablet Take 1 tablet (10 mg total) by mouth daily at 6 PM. 30 tablet 6  . betamethasone valerate (VALISONE) 0.1 % cream Apply 1 application topically 2 (two) times daily as needed (for skin irritation.).    Marland Kitchen cetirizine (ZYRTEC) 10 MG tablet Take 10 mg by mouth daily as needed for allergies.    Marland Kitchen ECHINACEA PO Take 760 mg by mouth daily.    . fluorometholone (FML) 0.1 % ophthalmic suspension Place 1 drop into both eyes 3 (three) times daily as needed (for irritated/itchy eyes.).    Marland Kitchen fluticasone (FLONASE) 50 MCG/ACT nasal spray Place 1 spray into both nostrils at bedtime as needed for allergies or rhinitis.    . Glucosamine-Chondroitin (COSAMIN DS PO) Take 1 tablet by mouth 2 (two) times daily.    Marland Kitchen loratadine (CLARITIN) 10 MG tablet Take 10 mg by mouth daily as needed for  allergies.    . metoprolol tartrate (LOPRESSOR) 25 MG tablet Take 0.5 tablets (12.5 mg total) by mouth 2 (two) times daily. 90 tablet 3  . Naphazoline-Pheniramine (OPCON-A) 0.027-0.315 % SOLN Place 1-2 drops into both eyes 3 (three) times daily as needed (for allergy eyes.).    Marland Kitchen NON FORMULARY Take 2 capsules by mouth 2 (two) times daily. DoTerra (Microplex VMz) 2 Capsule twice daily    . NON FORMULARY Take 2 capsules by mouth 2 (two) times daily. XE0 Mega "Doterra"    . oxyCODONE (OXY IR/ROXICODONE) 5 MG immediate release tablet Take 1 tablet (5 mg total) by mouth every 6 (six) hours as needed for moderate pain. 24 tablet 0  . sildenafil (REVATIO) 20 MG tablet Take 20 mg by mouth daily as needed (for ED).     . SUPER B COMPLEX/C PO Take 1 tablet by mouth daily.    Marland Kitchen triamcinolone cream (KENALOG) 0.1 % Apply 1 application topically daily as needed (FOR DRY SKIN/ITCHY SKIN.). Apply to area after bath (DO NOT APPLY TO FACE)  1  . losartan (COZAAR) 50 MG tablet Take 1 tablet (50 mg total) by mouth daily. 90 tablet 3   No current facility-administered medications for this visit.        Physical Exam: BP 115/83 (BP Location: Left Arm, Patient Position: Sitting, Cuff Size: Large)   Pulse 77   Resp 18   Ht 5\' 10"  (1.778 m)   Wt 225 lb (102.1 kg)   SpO2 96% Comment: RA  BMI 32.28 kg/m  General appearance: alert and cooperative Head: Normocephalic, without obvious abnormality, atraumatic Neck: no adenopathy, no carotid bruit, no JVD, supple, symmetrical, trachea midline and thyroid not enlarged, symmetric, no tenderness/mass/nodules Lymph nodes: Cervical, supraclavicular, and axillary nodes normal. Resp: clear to auscultation bilaterally Back: symmetric, no curvature. ROM normal. No CVA tenderness. Cardio: regular rate and rhythm, S1, S2 normal, no murmur, click, rub or gallop GI: soft, non-tender; bowel sounds normal; no masses,  no organomegaly Extremities: extremities normal, atraumatic,  no cyanosis or edema and Homans sign is negative, no sign of DVT Neurologic: Grossly normal  Diagnostic Studies & Laboratory data:  Recent Radiology Findings: Ct Angio Chest Aorta W/cm &/or Wo/cm  Result Date: 06/05/2018 CLINICAL DATA:  F/U scan - aortic dilation Asymptomatic Hx aortic valve replacement Hx testicular ca - surgery, skin ca - excision Prior CXR in 2019 EXAM: CT ANGIOGRAPHY CHEST WITH CONTRAST TECHNIQUE: Multidetector CT imaging of the chest was performed using the standard protocol during bolus administration of intravenous contrast. Multiplanar CT image reconstructions and MIPs were obtained to evaluate the vascular anatomy. CONTRAST:  70mL ISOVUE-370 IOPAMIDOL (ISOVUE-370) INJECTION 76% COMPARISON:  03/19/2017 FINDINGS: Cardiovascular: Heart size normal. No pericardial effusion. Incomplete opacification of the pulmonary artery branches; the exam was not optimized for detection of pulmonary emboli. Aortic valve replacement. Good contrast opacification of the thoracic aorta with transverse dimensions as follows: 4.5 cm sinuses of Valsalva 3.3 cm sino-tubular junction 3.4 cm ascending 3.3 cm arch 3.2 cm proximal descending 2.7 cm distal descending No dissection or stenosis. Classic 3 vessel brachiocephalic arterial origin anatomy without proximal stenosis. No significant atheromatous plaque. Mediastinum/Nodes: No hilar or mediastinal adenopathy. Lungs/Pleura: No pleural effusion. No pneumothorax. Lungs clear. Upper Abdomen: No acute findings Musculoskeletal: Previous median sternotomy. No fracture or worrisome bone lesions. Review of the MIP images confirms the above findings. IMPRESSION: 1. Stable changes of aortic valve replacement surgery without thoracic aortic aneurysm. Electronically Signed   By: Lucrezia Europe M.D.   On: 06/05/2018 12:00  I have independently reviewed the above radiology studies  and reviewed the findings with the patient.    Recent Labs: Lab Results  Component  Value Date   WBC 9.2 05/22/2018   HGB 15.6 05/22/2018   HCT 47.9 05/22/2018   PLT 196 05/22/2018   GLUCOSE 112 (H) 05/15/2018   CHOL 165 03/30/2017   TRIG 91 03/30/2017   HDL 24 (L) 03/30/2017   LDLDIRECT 160.9 02/12/2013   LDLCALC 123 (H) 03/30/2017   ALT 13 (L) 05/30/2017   AST 19 05/30/2017   NA 139 05/15/2018   K 4.3 05/15/2018   CL 106 05/15/2018   CREATININE 1.11 05/22/2018   BUN 20 05/15/2018   CO2 23 05/15/2018   TSH 3.911 03/29/2017   INR 1.38 06/05/2017   HGBA1C 5.2 05/30/2017   Echocardiography  Patient:    Beauden, Tremont MR #:       [id number] Study Date: 07/30/2017 Gender:     M Age:        63 Height:     180.3 cm Weight:     92.1 kg BSA:        2.17 m^2 Pt. Status: Room:   [facility]  SONOGRAPHER  Oletta Lamas, Will  PERFORMING   [facility], Outpatient  ATTENDING    Skeet Latch, MD  cc:  ------------------------------------------------------------------- LV EF: 50% -   55%  ------------------------------------------------------------------- Indications:      (Z95.2).  ------------------------------------------------------------------- History:   PMH:  S/p bioprosthetic AVR (31mm Magna Ease). Dilated aortic root.. Acquired from the patient and from the patient&'s chart.  Risk factors:  Former tobacco use. Dyslipidemia.  ------------------------------------------------------------------- Study Conclusions  - Left ventricle: The cavity size was normal. There was moderate   concentric hypertrophy. Systolic function was normal. The   estimated ejection fraction was in the range of 50% to 55%. Mild   hypokinesis of the anteroseptal, inferior, and inferoseptal   myocardium. Doppler parameters are consistent with abnormal left   ventricular relaxation (grade 1 diastolic dysfunction). Doppler   parameters are consistent with indeterminate  ventricular filling   pressure. - Aortic valve: A 62mm Edwards  Magna Ease bioprosthesis was present   and functioning properly. There was no regurgitation. Peak   velocity (S): 238 cm/s. Mean gradient (S): 13 mm Hg. - Aorta: Ascending aortic diameter: 39 mm (S). - Ascending aorta: The ascending aorta was mildly dilated. - Mitral valve: There was mild regurgitation. - Right ventricle: The cavity size was normal. Wall thickness was   normal. Systolic function was normal. - Tricuspid valve: There was mild regurgitation. - Pulmonary arteries: Systolic pressure was within the normal   range. PA peak pressure: 20 mm Hg (S).  ------------------------------------------------------------------- Study data:  Comparison was made to the study of 03/19/2017.  Study status:  Routine.  Procedure:  The patient reported no pain pre or post test. Transthoracic echocardiography for left ventricular function evaluation and for assessment of valvular function. Image quality was adequate.  Study completion:  There were no complications.          Echocardiography.  M-mode, complete 2D, spectral Doppler, and color Doppler.  Birthdate:  Patient birthdate: 1955-08-17.  Age:  Patient is 63 yr old.  Sex:  Gender: male.    BMI: 28.3 kg/m^2.  Blood pressure:     137/99  Patient status:  Outpatient.  Study date:  Study date: 07/30/2017. Study time: 08:49 AM.  Location:  [facility]  -------------------------------------------------------------------  ------------------------------------------------------------------- Left ventricle:  The cavity size was normal. There was moderate concentric hypertrophy. Systolic function was normal. The estimated ejection fraction was in the range of 50% to 55%.  Regional wall motion abnormalities:   Mild hypokinesis of the anteroseptal, inferior, and inferoseptal myocardium. Doppler parameters are consistent with abnormal left ventricular relaxation (grade 1 diastolic dysfunction). Doppler parameters are consistent  with indeterminate ventricular filling pressure.  ------------------------------------------------------------------- Aortic valve:  A 22mm Edwards Magna Ease bioprosthesis was present and functioning properly. Mobility was not restricted.  Doppler: Transvalvular velocity was minimally increased. There was no stenosis. There was no regurgitation.    VTI ratio of LVOT to aortic valve: 0.36. Valve area (VTI): 1.49 cm^2. Indexed valve area (VTI): 0.69 cm^2/m^2. Peak velocity ratio of LVOT to aortic valve: 0.36. Valve area (Vmax): 1.49 cm^2. Indexed valve area (Vmax): 0.69 cm^2/m^2. Mean velocity ratio of LVOT to aortic valve: 0.36. Valve area (Vmean): 1.5 cm^2. Indexed valve area (Vmean): 0.69 cm^2/m^2.   Mean gradient (S): 13 mm Hg. Peak gradient (S): 23 mm Hg.  ------------------------------------------------------------------- Aorta:  Aortic root: The aortic root was normal in size. Ascending aorta: The ascending aorta was mildly dilated.  ------------------------------------------------------------------- Mitral valve:   Structurally normal valve.   Mobility was not restricted.  Doppler:  Transvalvular velocity was within the normal range. There was no evidence for stenosis. There was mild regurgitation.  ------------------------------------------------------------------- Left atrium:  The atrium was normal in size.  ------------------------------------------------------------------- Right ventricle:  The cavity size was normal. Wall thickness was normal. Systolic function was normal.  ------------------------------------------------------------------- Pulmonic valve:    Structurally normal valve.   Cusp separation was normal.  Doppler:  Transvalvular velocity was within the normal range. There was no evidence for stenosis. There was no regurgitation.  ------------------------------------------------------------------- Tricuspid valve:   Structurally normal valve.     Doppler: Transvalvular velocity was within the normal range. There was mild regurgitation.  ------------------------------------------------------------------- Pulmonary artery:   The main pulmonary artery was normal-sized. Systolic pressure was within the normal range.  ------------------------------------------------------------------- Right atrium:  The atrium was normal in size.  ------------------------------------------------------------------- Pericardium:  There was no pericardial effusion.  ------------------------------------------------------------------- Systemic veins: Inferior vena cava: The vessel was normal in size. The respirophasic diameter changes were in the normal range (>= 50%), consistent with normal central venous pressure.  ------------------------------------------------------------------- Measurements   Left ventricle                            Value          Reference  LV ID, ED, PLAX chordal           (L)     36.9  mm       43 - 52  LV ID, ES, PLAX chordal                   27.7  mm       23 - 38  LV fx shortening, PLAX chordal    (L)     25    %        >=29  LV PW thickness, ED                       14.8  mm       ---------  IVS/LV PW ratio, ED                       0.97           <=1.3  Stroke volume, 2D                         71    ml       ---------  Stroke volume/bsa, 2D                     33    ml/m^2   ---------  LV ejection fraction, 1-p A4C             50    %        ---------  LV end-diastolic volume, 2-p              88    ml       ---------  LV end-systolic volume, 2-p               48    ml       ---------  LV ejection fraction, 2-p                 45    %        ---------  Stroke volume, 2-p                        40    ml       ---------  LV end-diastolic volume/bsa, 2-p          41    ml/m^2   ---------  LV end-systolic volume/bsa, 2-p           22    ml/m^2   ---------  Stroke volume/bsa, 2-p                    18.5  ml/m^2    ---------  LV e&', lateral                            8.38  cm/s     ---------  LV E/e&', lateral                          5.18           ---------  LV e&', medial                             4.09  cm/s     ---------  LV E/e&', medial                           10.61          ---------  LV e&', average                            6.24  cm/s     ---------  LV E/e&', average                          6.96           ---------    Ventricular septum                        Value          Reference  IVS thickness, ED                         14.31 mm       ---------    LVOT                                      Value          Reference  LVOT ID, S                                23    mm       ---------  LVOT area                                 4.15  cm^2     ---------  LVOT ID                                   23    mm       ---------  LVOT peak velocity, S                     85.7  cm/s     ---------  LVOT mean velocity, S                     60.8  cm/s     ---------  LVOT VTI, S                               17.1  cm       ---------  LVOT peak gradient, S  3     mm Hg    ---------  Stroke volume (SV), LVOT DP               71    ml       ---------  Stroke index (SV/bsa), LVOT DP            32.8  ml/m^2   ---------    Aortic valve                              Value          Reference  Aortic valve peak velocity, S             238   cm/s     ---------  Aortic valve mean velocity, S             168   cm/s     ---------  Aortic valve VTI, S                       47.6  cm       ---------  Aortic mean gradient, S                   13    mm Hg    ---------  Aortic peak gradient, S                   23    mm Hg    ---------  VTI ratio, LVOT/AV                        0.36           ---------  Aortic valve area, VTI                    1.49  cm^2     ---------  Aortic valve area/bsa, VTI                0.69  cm^2/m^2 ---------  Velocity ratio, peak, LVOT/AV             0.36            ---------  Aortic valve area, peak velocity          1.49  cm^2     ---------  Aortic valve area/bsa, peak               0.69  cm^2/m^2 ---------  velocity  Velocity ratio, mean, LVOT/AV             0.36           ---------  Aortic valve area, mean velocity          1.5   cm^2     ---------  Aortic valve area/bsa, mean               0.69  cm^2/m^2 ---------  velocity    Aorta                                     Value          Reference  Aortic root ID, ED                        39  mm       ---------  Ascending aorta ID, A-P, S                39    mm       ---------    Left atrium                               Value          Reference  LA ID, A-P, ES                            30    mm       ---------  LA ID/bsa, A-P                            1.39  cm/m^2   <=2.2  LA volume, S                              47    ml       ---------  LA volume/bsa, S                          21.7  ml/m^2   ---------  LA volume, ES, 1-p A4C                    36    ml       ---------  LA volume/bsa, ES, 1-p A4C                16.6  ml/m^2   ---------  LA volume, ES, 1-p A2C                    56    ml       ---------  LA volume/bsa, ES, 1-p A2C                25.9  ml/m^2   ---------    Mitral valve                              Value          Reference  Mitral E-wave peak velocity               43.4  cm/s     ---------  Mitral A-wave peak velocity               69.6  cm/s     ---------  Mitral deceleration time          (H)     419   ms       150 - 230  Mitral E/A ratio, peak                    0.6            ---------    Pulmonary arteries                        Value          Reference  PA pressure, S, DP  20    mm Hg    <=30    Tricuspid valve                           Value          Reference  Tricuspid regurg peak velocity            204   cm/s     ---------  Tricuspid peak RV-RA gradient             17    mm Hg    ---------    Systemic veins                             Value          Reference  Estimated CVP                             3     mm Hg    ---------    Right ventricle                           Value          Reference  RV pressure, S, DP                        20    mm Hg    <=30  RV s&', lateral, S                         10.5  cm/s     ---------  Legend: (L)  and  (H)  mark values outside specified reference range.  ------------------------------------------------------------------- Prepared and Electronically Authenticated by  Skeet Latch, MD 2019-04-02T13:31:20   Assessment / Plan:   Stable following tissue valve aortic valve replacement for worsening aortic insufficiency due to endocarditis February 2019.  Without evidence of recurrent aortic insufficiency or endocarditis.  Stable changes of aortic valve replacement surgery without thoracic aortic aneurysm. Aortic root 4.4 cm.  With the patient's prosthetic heart valve the risks of endocarditis have been discussed. The recommendations for periprocedural antibiotics including dental procedures have been discussed with the patient.  Plan see the patient back in 18 months with a follow-up CTA of the chest.   [name] 06/05/2018 12:44 PM

## 2018-06-13 DIAGNOSIS — G4733 Obstructive sleep apnea (adult) (pediatric): Secondary | ICD-10-CM | POA: Diagnosis not present

## 2018-06-19 DIAGNOSIS — Z23 Encounter for immunization: Secondary | ICD-10-CM | POA: Diagnosis not present

## 2018-06-26 ENCOUNTER — Other Ambulatory Visit: Payer: Self-pay | Admitting: Cardiology

## 2018-06-26 NOTE — Telephone Encounter (Signed)
Rx(s) sent to pharmacy electronically.  

## 2018-07-12 DIAGNOSIS — G4733 Obstructive sleep apnea (adult) (pediatric): Secondary | ICD-10-CM | POA: Diagnosis not present

## 2018-07-22 DIAGNOSIS — G4733 Obstructive sleep apnea (adult) (pediatric): Secondary | ICD-10-CM | POA: Diagnosis not present

## 2018-07-28 ENCOUNTER — Other Ambulatory Visit: Payer: Self-pay | Admitting: Cardiology

## 2018-08-12 DIAGNOSIS — G4733 Obstructive sleep apnea (adult) (pediatric): Secondary | ICD-10-CM | POA: Diagnosis not present

## 2018-08-25 ENCOUNTER — Encounter: Payer: Self-pay | Admitting: Internal Medicine

## 2018-09-11 DIAGNOSIS — G4733 Obstructive sleep apnea (adult) (pediatric): Secondary | ICD-10-CM | POA: Diagnosis not present

## 2018-09-17 DIAGNOSIS — N200 Calculus of kidney: Secondary | ICD-10-CM | POA: Diagnosis not present

## 2019-02-05 IMAGING — DX DG CHEST 2V
2 series · 2 of 2 positions shown · non-contrast
Comparison: CTA chest dated 03/19/2017

CLINICAL DATA: Endocarditis

EXAM:
CHEST  2 VIEW

[chest pa]
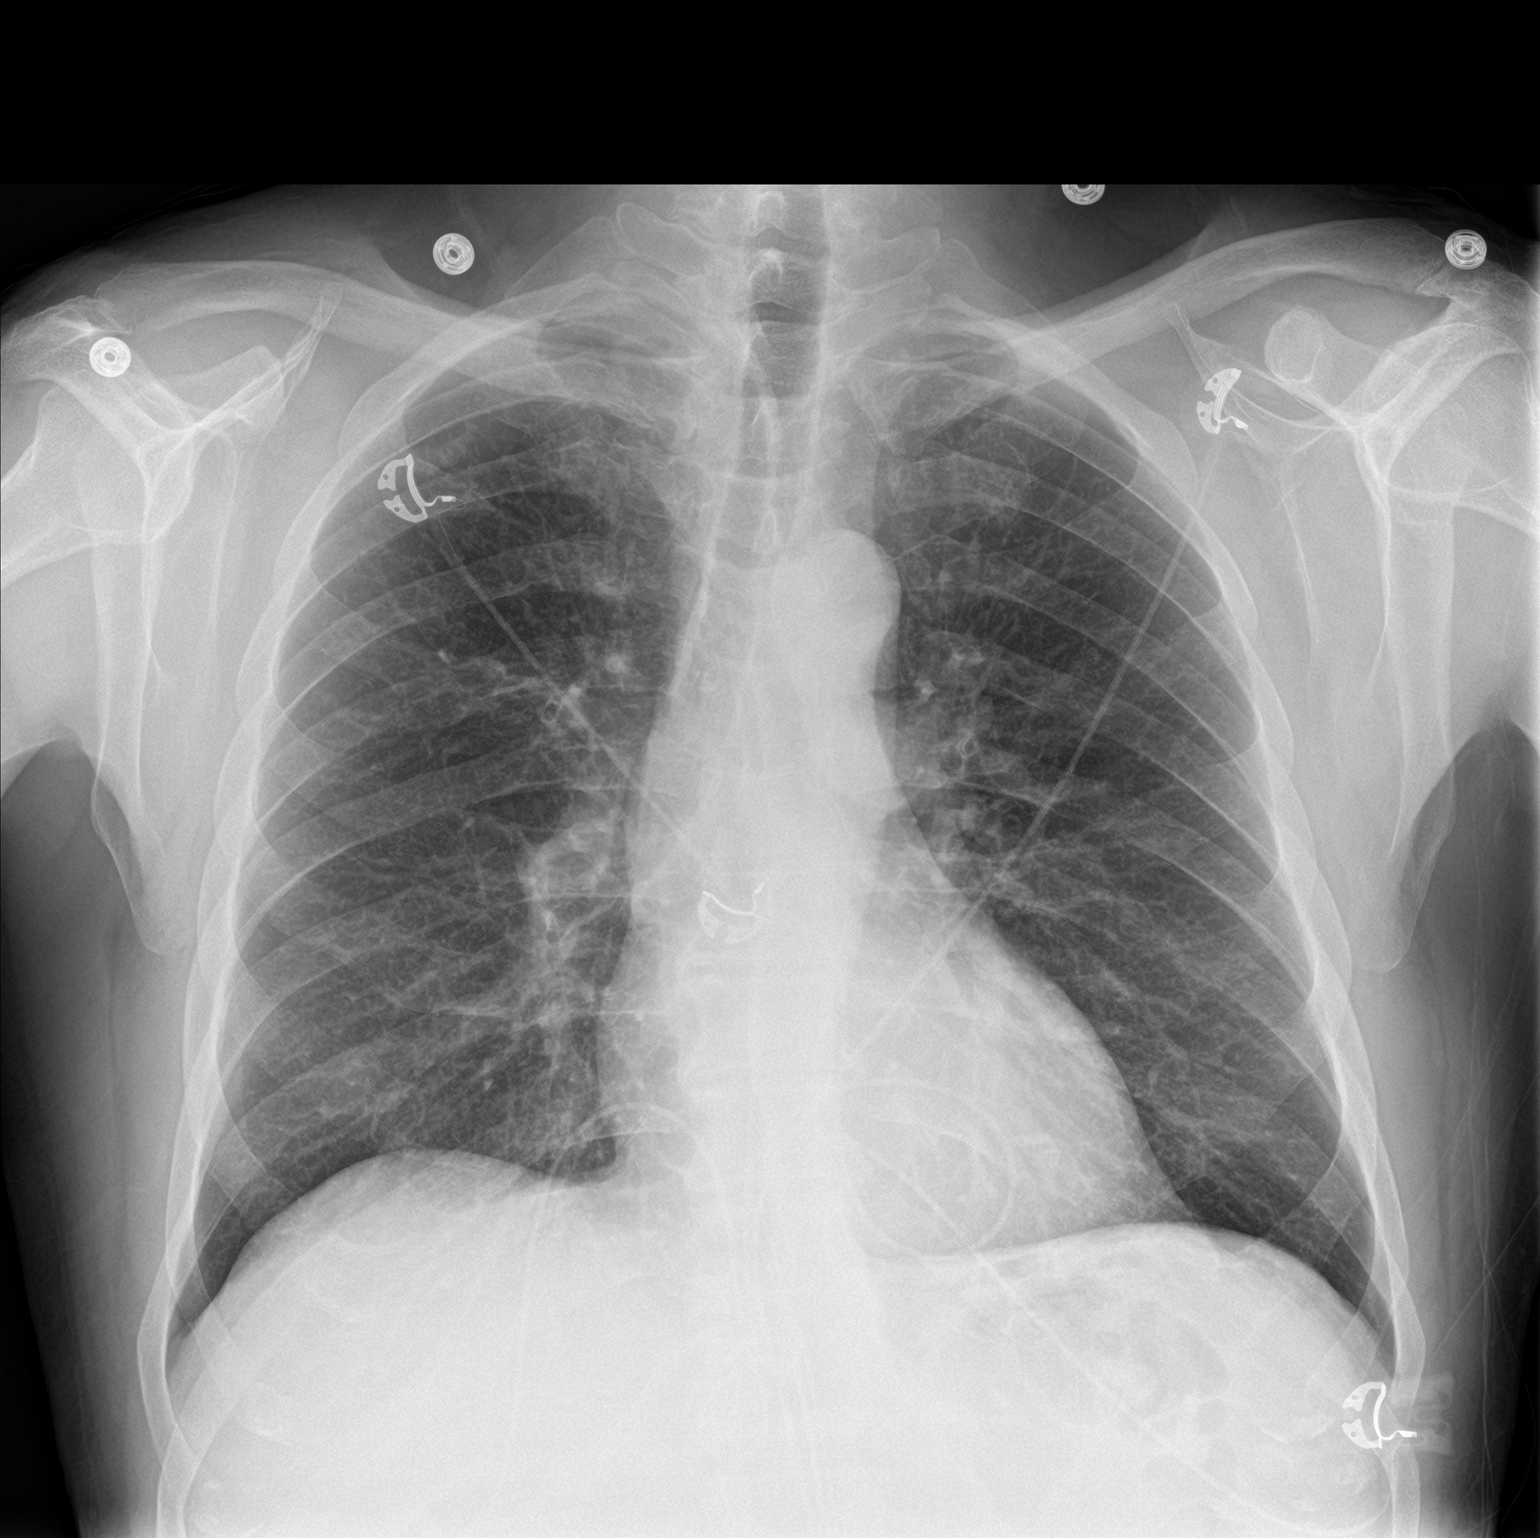

[chest lat]
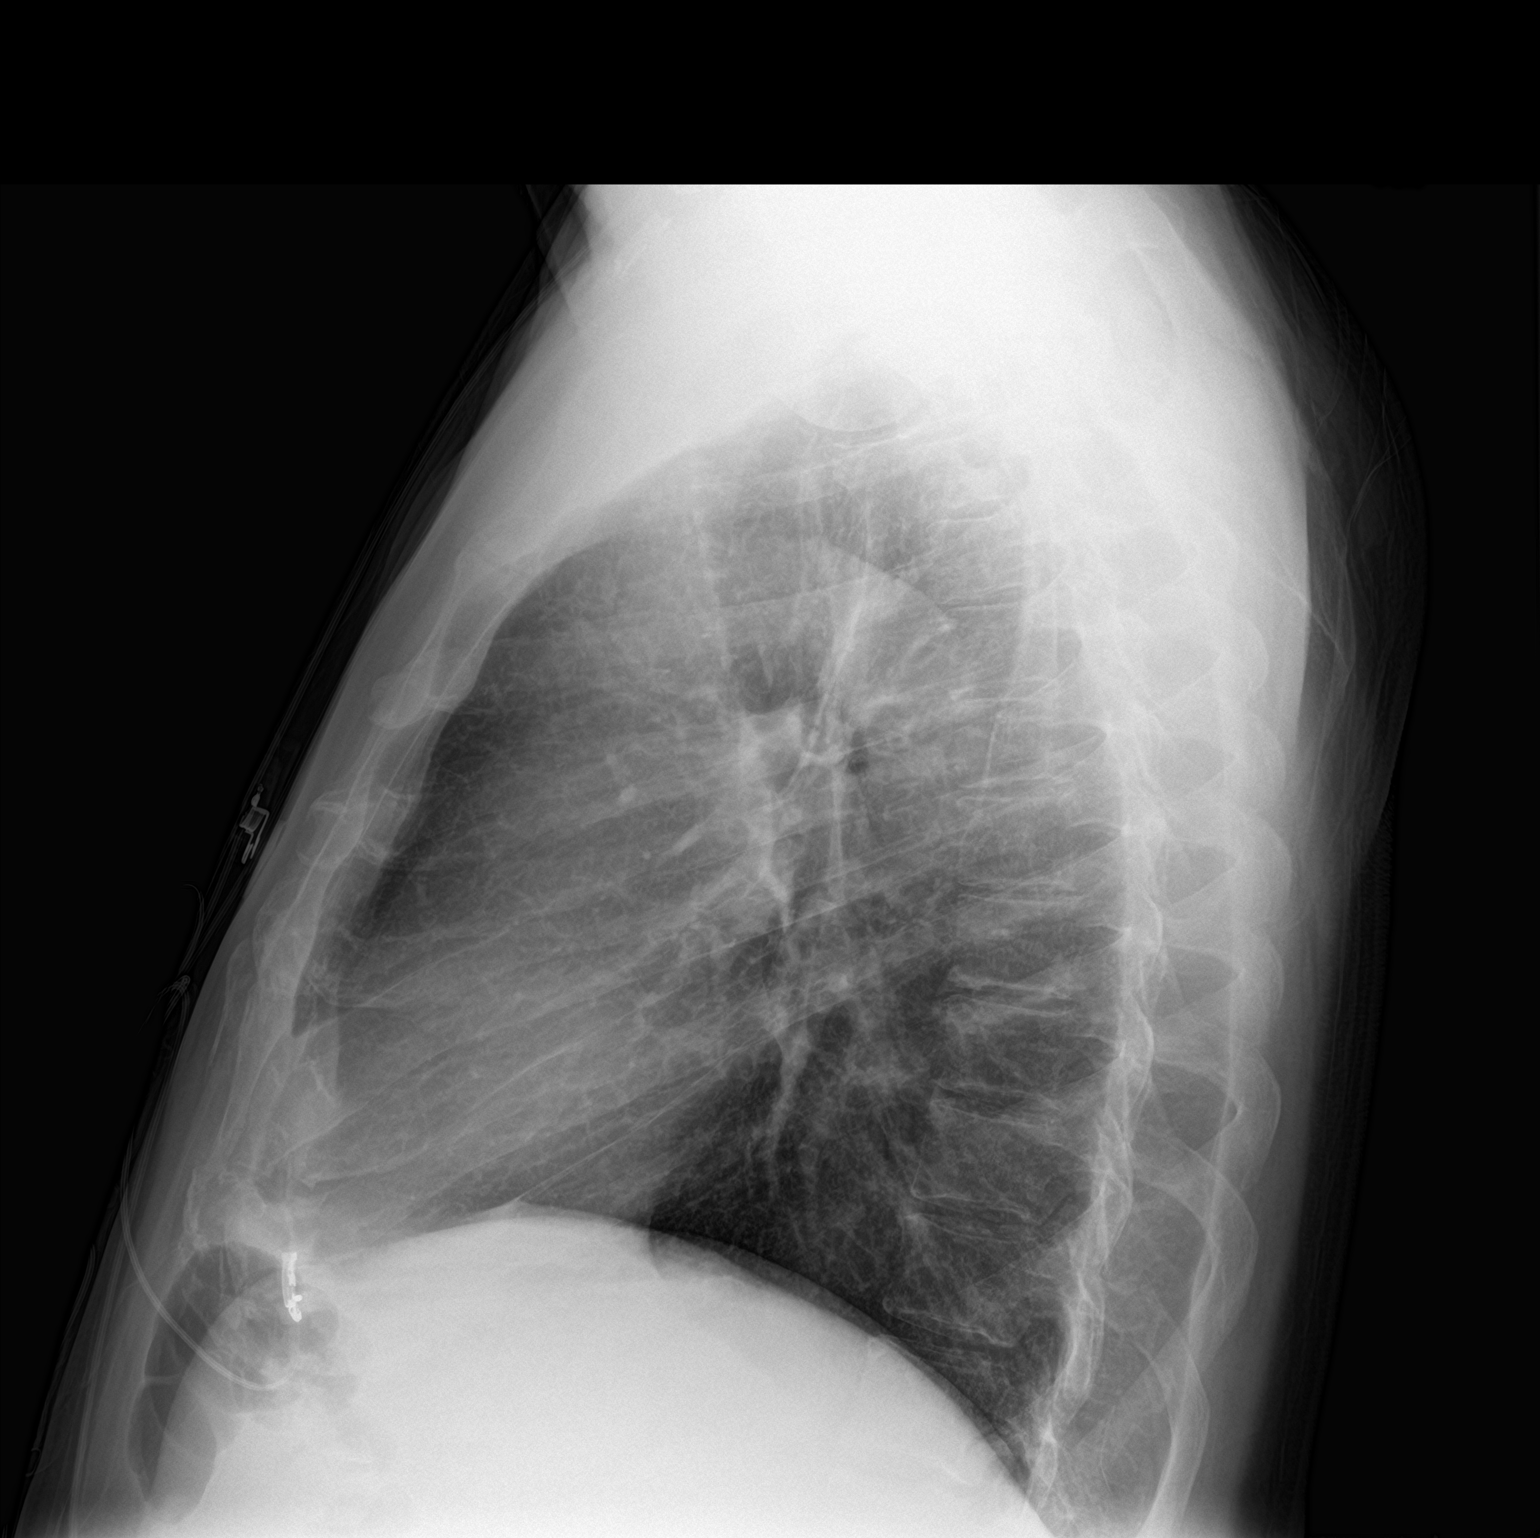

[2 of 2 positions shown; findings below may reference images not displayed]

FINDINGS: Lungs are clear.  No pleural effusion or pneumothorax.

The heart is normal in size.

Visualized osseous structures are within normal limits.
IMPRESSION: No evidence of acute cardiopulmonary disease.

## 2019-03-17 ENCOUNTER — Other Ambulatory Visit: Payer: Self-pay | Admitting: Cardiology

## 2019-04-10 IMAGING — MR MR HIP*L* W/O CM
4 of 6 series · 22 of 40 positions shown · non-contrast
Comparison: None.

CLINICAL DATA: Bilateral hip pain, right greater than left for 6
months. History of testicular cancer.

EXAM:
MR OF THE RIGHT HIP WITHOUT CONTRAST
MR OF THE LEFT  HIP WITHOUT CONTRAST
TECHNIQUE: Multiplanar, multisequence MR imaging was performed. No intravenous
contrast was administered.

[Series 3: T1 · coronal · 4.0mm · 0.62mm/px · 5 of 34 slices shown]
[im 1/34]
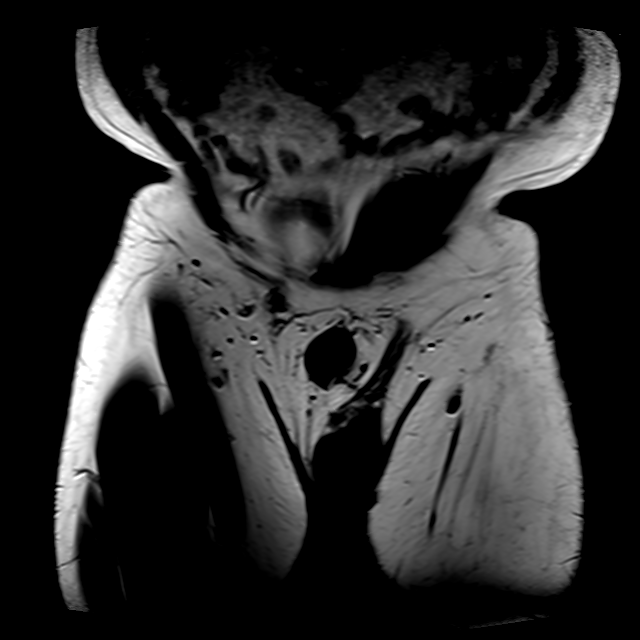
[im 5/34]
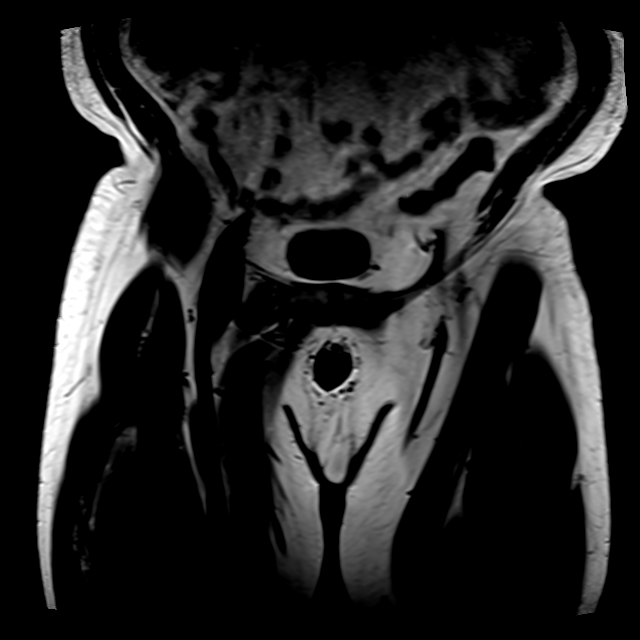
[im 10/34]
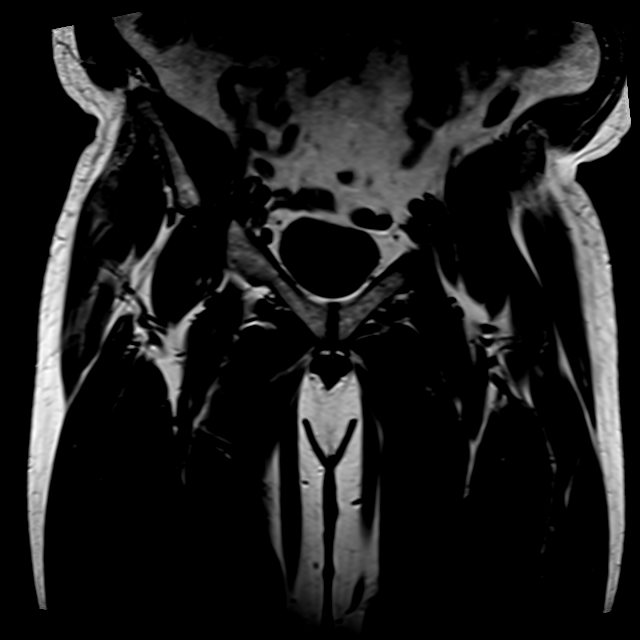
[im 19/34]
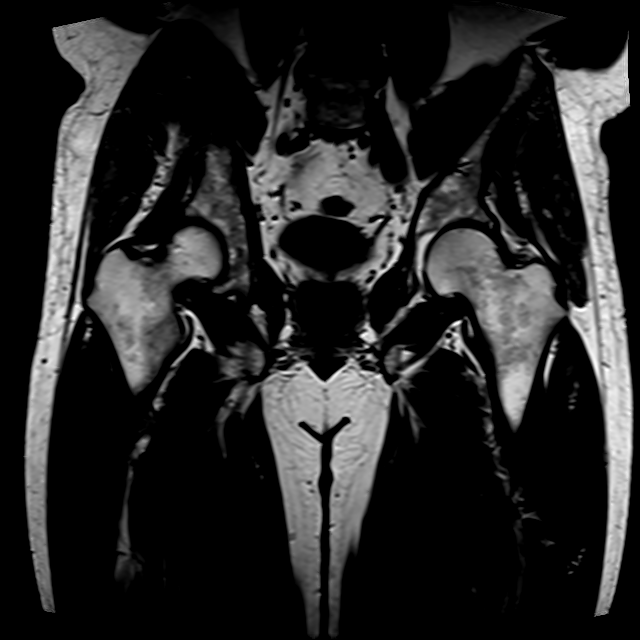
[im 29/34]
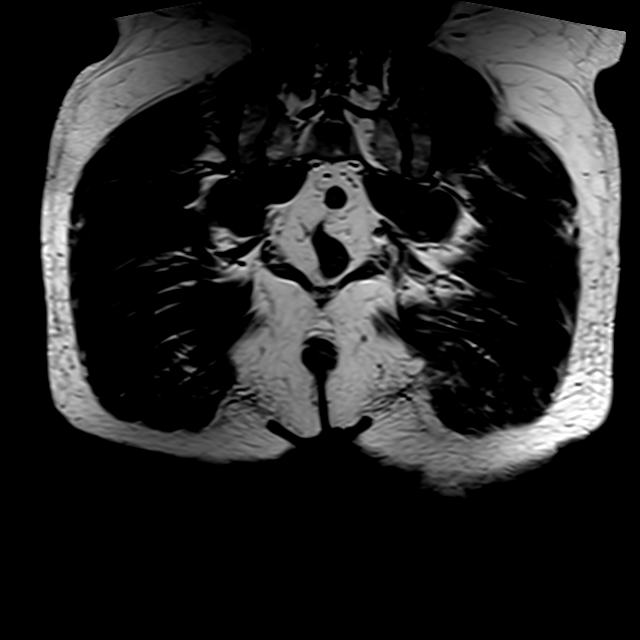

[Series 4: T2 fat-sat · axial · 4.0mm · 0.62mm/px · z∈[-27,+117]mm · 7 of 31 slices shown]
[im 1/31]
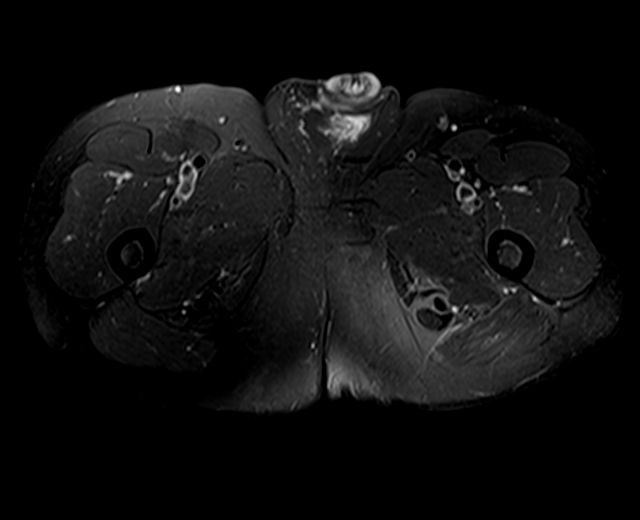
[im 6/31]
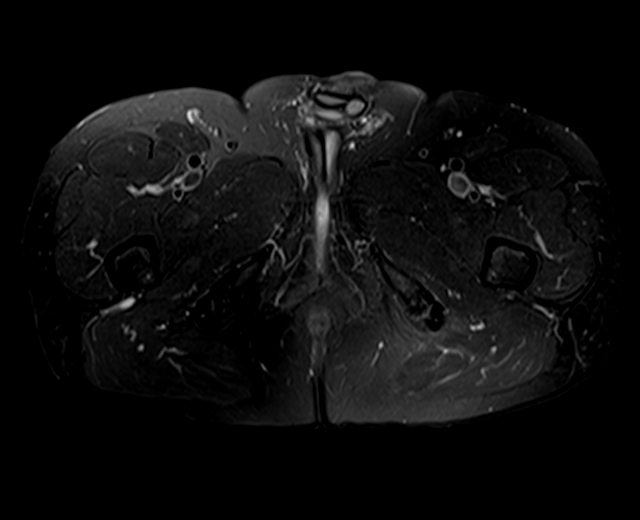
[im 11/31]
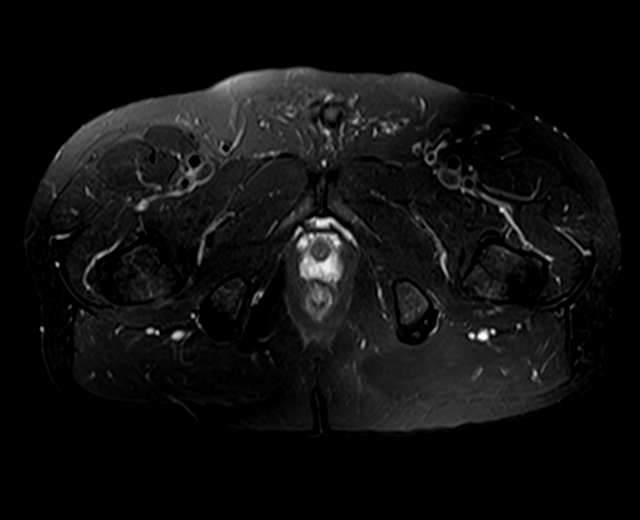
[im 16/31]
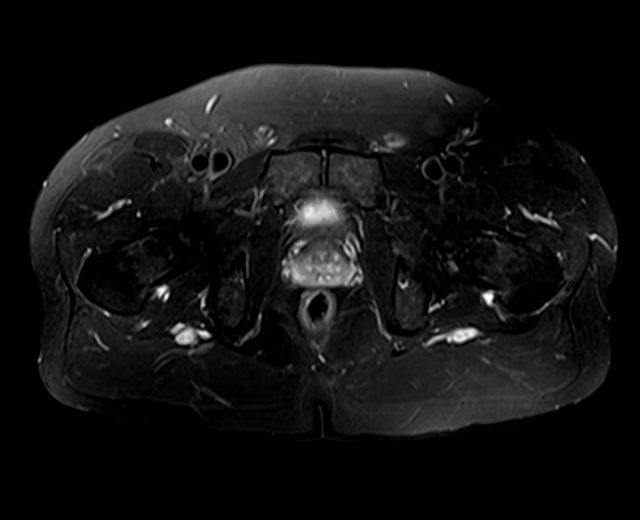
[im 21/31]
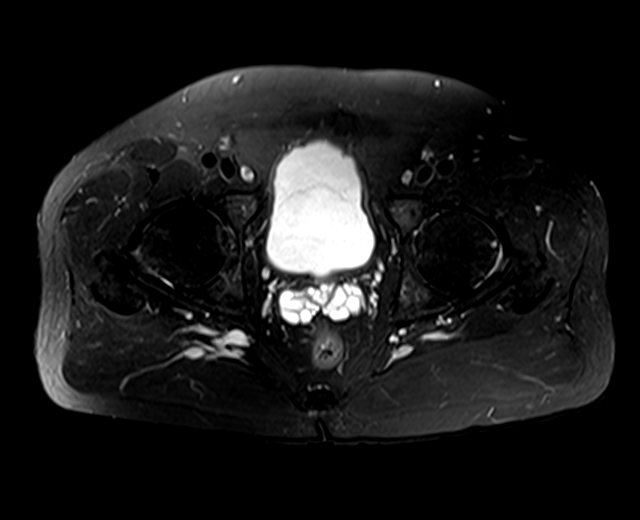
[im 26/31]
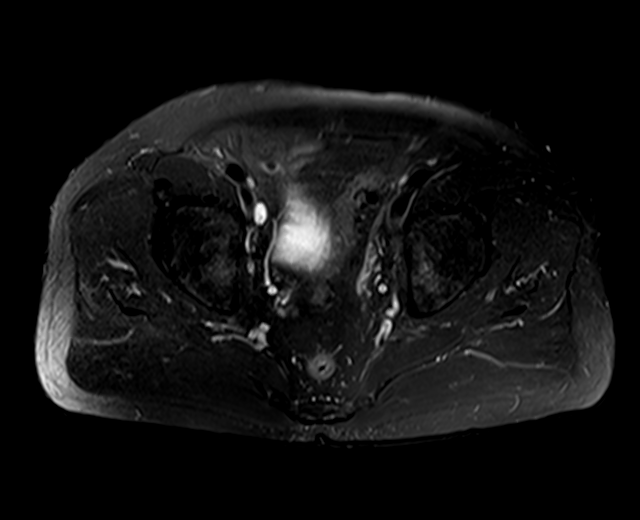
[im 31/31]
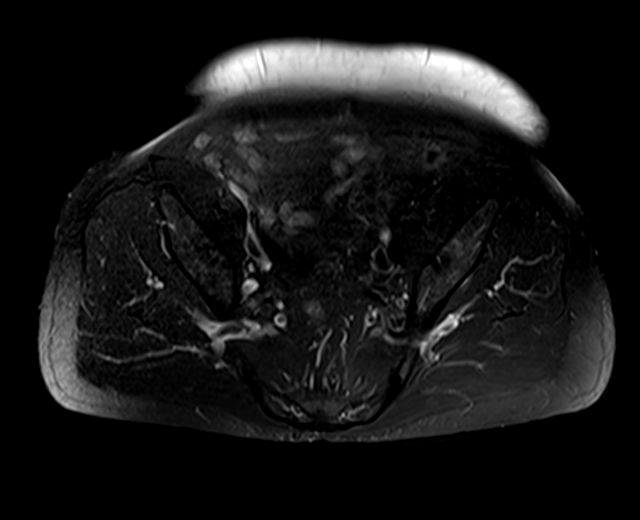

[Series 6: PD fat-sat · sagittal · 4.0mm · 0.66mm/px · 5 of 22 slices shown (1 of 2)]
[im 1/22]
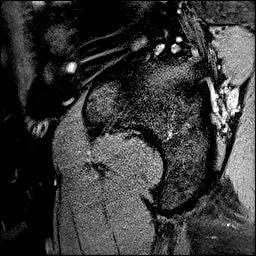
[im 6/22]
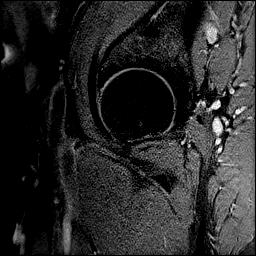
[im 11/22]
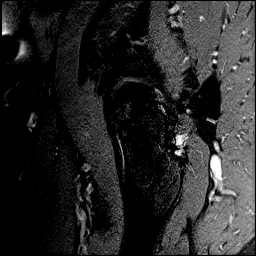
[im 16/22]
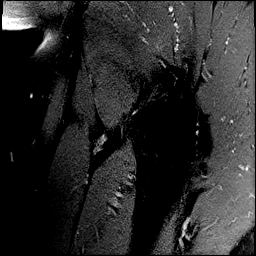
[im 22/22]
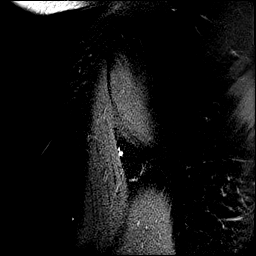

[Series 7: PD fat-sat · sagittal · 4.0mm · 0.66mm/px · 5 of 22 slices shown (2 of 2)]
[im 1/22]
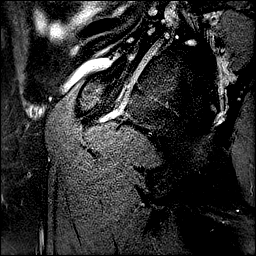
[im 6/22]
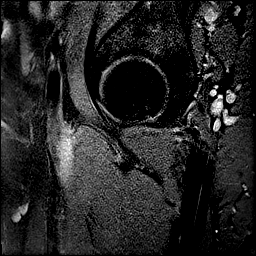
[im 11/22]
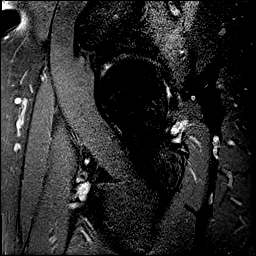
[im 16/22]
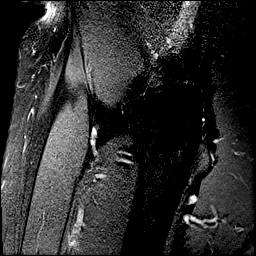
[im 22/22]
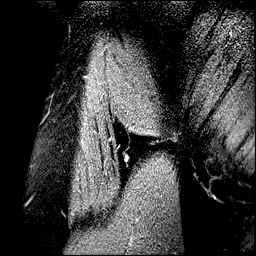

[22 of 40 positions shown; findings below may reference images not displayed]

FINDINGS: Bones: No hip fracture, dislocation or avascular necrosis. No
periosteal reaction or bone destruction. No aggressive osseous
lesion.

Normal sacrum and sacroiliac joints. No SI joint widening or erosive
changes.

Articular cartilage and labrum

Articular cartilage:  No chondral defect.

Labrum:  Limited evaluation secondary lack of intra-articular fluid.

Joint or bursal effusion

Joint effusion:  No hip joint effusion.  No SI joint effusion.

Bursae:  No bursa formation.

Muscles and tendons

Flexors: Normal.

Extensors: Normal.

Abductors: Normal.

Adductors: Normal.

Gluteals: Normal.

Hamstrings: Normal.

Other findings

Miscellaneous: No pelvic free fluid. No fluid collection or
hematoma. No inguinal lymphadenopathy. No inguinal hernia. Prior
right orchiectomy.
IMPRESSION: 1. No hip fracture, dislocation or avascular necrosis of bilateral
hips.
2. No evidence of septic arthritis of bilateral hips.

## 2019-04-13 NOTE — Progress Notes (Signed)
Virtual Visit via Video Note changed to phone visit at patient request   This visit type was conducted due to national recommendations for restrictions regarding the COVID-19 Pandemic (e.g. social distancing) in an effort to limit this patient's exposure and mitigate transmission in our community.  Due to his co-morbid illnesses, this patient is at least at moderate risk for complications without adequate follow up.  This format is felt to be most appropriate for this patient at this time.  All issues noted in this document were discussed and addressed.  A limited physical exam was performed with this format.  Please refer to the patient's chart for his consent to telehealth for [facility].   Date:  04/17/2019   ID:  [name] Anderson, DOB [birth year], MRN [id number]  Patient Location:Home Provider Location: Home  PCP:  Antony Contras, MD  Cardiologist:  Dr Stanford Breed  Evaluation Performed:  Follow-Up Visit  Chief Complaint:  FU AVR  History of Present Illness:    FUAVR.Echocardiogram repeated November 2018. Ejection fraction 50-55%, severe left ventricular enlargement, grade 1 diastolic dysfunction, severe aortic insufficiency, dilated aortic root at 46 mm, mild mitral regurgitation and mild left atrial enlargement. CTA of thoracic aorta repeated November 2018. The aortic root measured 4.7 cm. Transesophageal echocardiogram November 2018 showed normal LV function, aortic valve vegetation on left coronary cusp, severe aortic insufficiency, mildly dilated aortic root, redundant mitral valve chordae and associated vegetation could not be excluded, mild mitral regurgitation. Blood cultures grew coag negative staph. Patient placed on antibiotics with plans for aortic valve/aortic root replacement after completing 6 weeks. Cardiac catheterization12/20 showed normal coronary arteries and normal right and left heart pressures. Preoperative carotid Dopplers January 2019 showed no stenosis. Patient  underwent aortic valve replacement(bioprosthetic AVR)on June 05, 2017. The aortic root was not replaced as it measured 4.1 cm at time of surgery. Patient did have postoperative atrial fibrillation treated with amiodarone. Echocardiogram April 2019 showed ejection fraction 50 to [id number], grade 1 diastolic dysfunction, bioprosthetic aortic valve with no aortic insufficiency and mean gradient 13 mmHg, ascending aorta 39 mm, mild mitral regurgitation. Chest CTA February 2020 showed stable changes of aortic valve replacement without thoracic aortic aneurysm.  Since last seen,the patient has dyspnea with more extreme activities but not with routine activities. It is relieved with rest. It is not associated with chest pain. There is no orthopnea, PND or pedal edema. There is no syncope or palpitations. There is no exertional chest pain.   The patient does not have symptoms concerning for COVID-19 infection (fever, chills, cough, or new shortness of breath).    Past Medical History:  Diagnosis Date  . Allergy   . Anemia   . Barrett's esophagus - short segment 03/13/2017  . Cancer ([place]) 09/11/13   testicular  . Complication of anesthesia    hard to wake  - 20 years ago  . ED (erectile dysfunction)   . Endocarditis    Archie Endo 03/29/2017  . H/O aneurysm 03/05/2017  . High cholesterol   . History of basal cell carcinoma excision    02/2002   --  NOSE  &   2013  RIGHT EAR  . History of kidney stones   . Hyperlipidemia   . Hypertension   . Mild obstructive sleep apnea    PER STUDY 01-18-2005--  NO CPAP OR MOUTH GUARD  . Moderate aortic valve insufficiency   . Multiple pulmonary nodules    PER CT--  PROBABLE  GRANULOMATOUS - patient is not aware  of this  . Personal history of colonic polyps    TUBULAR ADENOMA--   . Seasonal allergies   . 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[place])   . Testicular mass    RIGHT   Past Surgical History:  Procedure Laterality Date  . AORTIC VALVE REPLACEMENT N/A 06/05/2017    Procedure: AORTIC VALVE REPLACEMENT (AVR) using 61mm Magna Ease valve;  Surgeon: [name], MD;  Location: [place];  Service: Open Heart Surgery;  Laterality: N/A;  . COLONOSCOPY  01/24/06, 07/10/11  . COLONOSCOPY WITH ESOPHAGOGASTRODUODENOSCOPY (EGD)    . EXTRACORPOREAL SHOCK WAVE LITHOTRIPSY    . HERNIA REPAIR    . MOHS SURGERY  02/2002  &  2013   TIP OF NOSE-///     RIGHT EAR  . ORCHIECTOMY Right 09/11/2013   Procedure:  RIGHT ORCHIECTOMY;  Surgeon: Jorja Loa, MD;  Location: [facility];  Service: Urology;  Laterality: Right;  . RIGHT/LEFT HEART CATH AND CORONARY ANGIOGRAPHY N/A 04/18/2017   Procedure: RIGHT/LEFT HEART CATH AND CORONARY ANGIOGRAPHY;  Surgeon: Sherren Mocha, MD;  Location: [place] [facility];  Service: Cardiovascular;  Laterality: N/A;  . SCROTAL EXPLORATION Right 09/11/2013   Procedure: RIGHT INGUINAL EXPLORATION;  Surgeon: Jorja Loa, MD;  Location: [facility];  Service: Urology;  Laterality: Right;  . TEE WITHOUT CARDIOVERSION N/A 03/29/2017   Procedure: TRANSESOPHAGEAL ECHOCARDIOGRAM (TEE);  Surgeon: Lelon Perla, MD;  Location: [facility] ENDOSCOPY;  Service: Cardiovascular;  Laterality: N/A;  . TEE WITHOUT CARDIOVERSION N/A 06/05/2017   Procedure: TRANSESOPHAGEAL ECHOCARDIOGRAM (TEE);  Surgeon: [name], MD;  Location: [place];  Service: Open Heart Surgery;  Laterality: N/A;  . TRANSTHORACIC ECHOCARDIOGRAM  02-25-2013   DR Maurine Mowbray   NORMAL LVSF/  EF 55-60%/   GRADE 2 DIASTOLIC DYSFUNCTION/  ECCENTRIC MODERATE AORTIC INSUFFICIENCY WITHOUT STENOSIS/  MILD DILATED AORTIC ROOT/ TRIVIAL MR, TR,  & PR  . UMBILICAL HERNIA REPAIR  06-08-2009  . UMBILICAL HERNIA REPAIR  05/22/2018   WITH MESH  . UMBILICAL HERNIA REPAIR N/A 05/22/2018   Procedure: LAPAROSCOPIC REPAIR OF RECURRENT UMBILICAL HERNIA WITH MESH;  Surgeon: Donnie Mesa, MD;  Location: [place];  Service: General;  Laterality: N/A;  . URETEROSCOPIC LASER  LITHOTRIPSY STONE EXTRACTION  2007     Current Meds  Medication Sig  . acetaminophen (TYLENOL) 500 MG tablet Take 1 tablet (500 mg total) by mouth every 6 (six) hours as needed (for pain.).  Marland Kitchen aluminum chloride (HYPERCARE) 20 % external solution Apply 1 application topically 3 (three) times daily as needed (for sweating.).  Marland Kitchen Ascorbic Acid (VITAMIN C) 1000 MG tablet Take 1,000 mg by mouth daily.  Marland Kitchen aspirin EC 81 MG tablet Take 81 mg by mouth daily.  Marland Kitchen atorvastatin (LIPITOR) 10 MG tablet Take 1 tablet (10 mg total) by mouth daily at 6 PM.  . betamethasone valerate (VALISONE) 0.1 % cream Apply 1 application topically 2 (two) times daily as needed (for skin irritation.).  Marland Kitchen cetirizine (ZYRTEC) 10 MG tablet Take 10 mg by mouth daily as needed for allergies.  Marland Kitchen ECHINACEA PO Take 760 mg by mouth daily.  . fluorometholone (FML) 0.1 % ophthalmic suspension Place 1 drop into both eyes 3 (three) times daily as needed (for irritated/itchy eyes.).  Marland Kitchen fluticasone (FLONASE) 50 MCG/ACT nasal spray Place 1 spray into both nostrils at bedtime as needed for allergies or rhinitis.  . Glucosamine-Chondroitin (COSAMIN DS PO) Take 1 tablet by mouth 2 (two) times daily.  Marland Kitchen loratadine (CLARITIN) 10  MG tablet Take 10 mg by mouth daily as needed for allergies.  Marland Kitchen losartan (COZAAR) 50 MG tablet TAKE 1 TABLET BY MOUTH  DAILY  . metoprolol tartrate (LOPRESSOR) 25 MG tablet Take 0.5 tablets (12.5 mg total) by mouth 2 (two) times daily. KEEP OV.  . Naphazoline-Pheniramine (OPCON-A) 0.027-0.315 % SOLN Place 1-2 drops into both eyes 3 (three) times daily as needed (for allergy eyes.).  Marland Kitchen NON FORMULARY Take 2 capsules by mouth 2 (two) times daily. DoTerra (Microplex VMz) 2 Capsule twice daily  . NON FORMULARY Take 2 capsules by mouth 2 (two) times daily. XE0 Mega "Doterra"  . oxyCODONE (OXY IR/ROXICODONE) 5 MG immediate release tablet Take 1 tablet (5 mg total) by mouth every 6 (six) hours as needed for moderate pain.  .  sildenafil (REVATIO) 20 MG tablet Take 20 mg by mouth daily as needed (for ED).   . SUPER B COMPLEX/C PO Take 1 tablet by mouth daily.  Marland Kitchen triamcinolone cream (KENALOG) 0.1 % Apply 1 application topically daily as needed (FOR DRY SKIN/ITCHY SKIN.). Apply to area after bath (DO NOT APPLY TO FACE)     Allergies:   Patient has no known allergies.   Social History   Tobacco Use  . Smoking status: Current Some Day Smoker    Types: Pipe  . Smokeless tobacco: Former Systems developer    Types: [place] date: 04/26/2011  . Tobacco comment: OCCASIONAL  PIPE SMOKER  (NEVER SMOKED CIGARETTES)  Substance Use Topics  . Alcohol use: Yes    Comment: RARE  . Drug use: No     Family Hx: The patient's family history includes Colon cancer in his father; Heart disease in his paternal grandmother.  ROS:   Please see the history of present illness.    No Fever, chills  or productive cough; complains of allergies All other systems reviewed and are negative.   Recent Labs: 05/15/2018: BUN 20; Potassium 4.3; Sodium 139 05/22/2018: Creatinine, Ser 1.11; Hemoglobin 15.6; Platelets 196   Recent Lipid Panel Lab Results  Component Value Date/Time   CHOL 165 03/30/2017 03:53 AM   TRIG 91 03/30/2017 03:53 AM   HDL 24 (L) 03/30/2017 03:53 AM   CHOLHDL 6.9 03/30/2017 03:53 AM   LDLCALC 123 (H) 03/30/2017 03:53 AM   LDLDIRECT 160.9 02/12/2013 07:34 AM    Wt Readings from Last 3 Encounters:  04/17/19 230 lb (104.3 kg)  06/05/18 225 lb (102.1 kg)  05/22/18 225 lb 12.8 oz (102.4 kg)     Objective:    Vital Signs:  BP 111/78   Pulse 76   Ht 5\' 11"  (1.803 m)   Wt 230 lb (104.3 kg)   BMI 32.08 kg/m    VITAL SIGNS:  reviewed NAD Answers questions appropriately Normal affect Remainder of physical examination not performed (telehealth visit; coronavirus pandemic)  ASSESSMENT & PLAN:    1. Prior aortic valve replacement-continue SBE prophylaxis. 2. History of thoracic aortic aneurysm-not evident on most  recent CTA.  We will plan follow-up study 2/21. 3. Hypertension-blood pressure controlled.  Continue present medications and follow. 4. Hyperlipidemia-continue statin. Lipids and liver followed by primary care.  COVID-19 Education: The importance of social distancing was discussed today.  Time:   Today, I have spent 18 minutes with the patient with telehealth technology discussing the above problems.     Medication Adjustments/Labs and Tests Ordered: Current medicines are reviewed at length with the patient today.  Concerns regarding medicines are outlined above.  Tests Ordered: No orders of the defined types were placed in this encounter.   Medication Changes: No orders of the defined types were placed in this encounter.   Follow Up:  Either In Person or Virtual in 1 year(s)  Signed, Kirk Ruths, MD  04/17/2019 8:41 AM    [place]

## 2019-04-17 ENCOUNTER — Telehealth (INDEPENDENT_AMBULATORY_CARE_PROVIDER_SITE_OTHER): Payer: 59 | Admitting: Cardiology

## 2019-04-17 ENCOUNTER — Encounter: Payer: Self-pay | Admitting: Cardiology

## 2019-04-17 VITALS — BP 111/78 | HR 76 | Ht 71.0 in | Wt 230.0 lb

## 2019-04-17 DIAGNOSIS — I77810 Thoracic aortic ectasia: Secondary | ICD-10-CM

## 2019-04-17 DIAGNOSIS — Z952 Presence of prosthetic heart valve: Secondary | ICD-10-CM

## 2019-04-17 DIAGNOSIS — I10 Essential (primary) hypertension: Secondary | ICD-10-CM | POA: Diagnosis not present

## 2019-04-17 DIAGNOSIS — E7800 Pure hypercholesterolemia, unspecified: Secondary | ICD-10-CM

## 2019-04-17 DIAGNOSIS — Z87898 Personal history of other specified conditions: Secondary | ICD-10-CM | POA: Diagnosis not present

## 2019-04-17 DIAGNOSIS — Z8249 Family history of ischemic heart disease and other diseases of the circulatory system: Secondary | ICD-10-CM

## 2019-04-17 NOTE — Patient Instructions (Signed)
Medication Instructions:  NO CHANGE *If you need a refill on your cardiac medications before your next appointment, please call your pharmacy*  Lab Work: If you have labs (blood work) drawn today and your tests are completely normal, you will receive your results only by: . MyChart Message (if you have MyChart) OR . A paper copy in the mail If you have any lab test that is abnormal or we need to change your treatment, we will call you to review the results.  Follow-Up: At [facility], you and your health needs are our priority.  As part of our continuing mission to provide you with exceptional heart care, we have created designated Provider Care Teams.  These Care Teams include your primary Cardiologist (physician) and Advanced Practice Providers (APPs -  Physician Assistants and Nurse Practitioners) who all work together to provide you with the care you need, when you need it.  Your next appointment:   12 month(s)  The format for your next appointment:   Either In Person or Virtual  Provider:   You may see Brian Crenshaw, MD or one of the following Advanced Practice Providers on your designated Care Team:    Luke Kilroy, PA-C  Callie Goodrich, PA-C  Jesse Cleaver, FNP    

## 2019-06-06 ENCOUNTER — Other Ambulatory Visit: Payer: Self-pay | Admitting: Cardiology

## 2019-08-12 ENCOUNTER — Other Ambulatory Visit: Payer: Self-pay | Admitting: Cardiology

## 2019-11-12 ENCOUNTER — Other Ambulatory Visit: Payer: Self-pay | Admitting: *Deleted

## 2019-11-12 DIAGNOSIS — I712 Thoracic aortic aneurysm, without rupture, unspecified: Secondary | ICD-10-CM

## 2019-12-17 ENCOUNTER — Other Ambulatory Visit: Payer: Self-pay

## 2019-12-17 ENCOUNTER — Other Ambulatory Visit: Payer: 59

## 2019-12-17 ENCOUNTER — Ambulatory Visit
Admission: RE | Admit: 2019-12-17 | Discharge: 2019-12-17 | Disposition: A | Discharge status: Home / Self Care | Payer: 59 | Source: Ambulatory Visit | Attending: Cardiothoracic Surgery | Admitting: Cardiothoracic Surgery

## 2019-12-17 ENCOUNTER — Telehealth: Payer: 59 | Admitting: Cardiothoracic Surgery

## 2019-12-17 DIAGNOSIS — I712 Thoracic aortic aneurysm, without rupture, unspecified: Secondary | ICD-10-CM

## 2019-12-17 MED ORDER — IOPAMIDOL (ISOVUE-370) INJECTION 76%
75.0000 mL | Freq: Once | INTRAVENOUS | Status: AC | PRN
  Administered 2019-12-17: 75 mL via INTRAVENOUS

## 2019-12-17 NOTE — Progress Notes (Unsigned)
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 PROCEDURE:  Aortic valve replacement with pericardial tissue valve Edwards [employer], model 3300TFX, 25 mm, serial #[id number]. SURGEON:  Lanelle Bal, M.D.  History of Present Illness:     Patient was scheduled for a return to the officetoday for postop check following aortic valve replacement for severe aortic insufficiency June 05, 2017.  Because of a prolonged shutdown of the epic computer system on the day of the patient's visit and also intermittent problems with the x-ray department the patient was not seen in the office but have reviewed the findings of the CT scan with him on the phone   the patient had had a previous history of aortic insufficiency, in the fall 2018 he had sudden increase in the degree of insufficiency and was found to have endocarditis he was treated with 8 weeks of IV antibiotics and underwent aortic valve replacement in February 2019.  Postop follow-up echocardiogram done April 2019 shows good functioning of his aortic valve aortic root measured it 3.9 cm.   Patient continues to do well postoperatively without evidence of recurrent heart failure or endocarditis.  He is now   18 MONTHS .    Zubrod Score: At the time of  surgery this patient's most appropriate activity status/level should be described as: [x]     0    Normal activity, no symptoms []     1    Restricted in physical strenuous activity but ambulatory, able to do out light work []     2    Ambulatory and capable of self care, unable to do work activities, up and about                 >50 % of waking hours                                                                                   []     3    Only limited self care, in bed greater than 50% of waking hours []     4    Completely disabled, no self care, confined to bed or chair []     5    Moribund  Social History   Tobacco Use  Smoking Status Current Some Day Smoker  . Types: Pipe  Smokeless Tobacco Former Systems developer  . Types: Chew  . Quit date: 04/26/2011  Tobacco Comment  OCCASIONAL  PIPE SMOKER  (NEVER SMOKED CIGARETTES)       No Known Allergies  Current Outpatient Medications  Medication Sig Dispense Refill  . acetaminophen (TYLENOL) 500 MG tablet Take 1 tablet (500 mg total) by mouth every 6 (six) hours as needed (for pain.). 30 tablet 0  . aluminum chloride (HYPERCARE) 20 % external solution Apply 1 application topically 3 (three) times daily as needed (for sweating.).    Marland Kitchen Ascorbic Acid (VITAMIN C) 1000 MG tablet Take 1,000 mg by mouth daily.    Marland Kitchen aspirin EC 81 MG tablet Take 81 mg by mouth daily.    Marland Kitchen atorvastatin (LIPITOR) 10 MG tablet Take 1 tablet (10 mg total) by mouth daily at 6 PM. 30 tablet 6  . betamethasone valerate (VALISONE) 0.1 % cream Apply 1 application topically 2 (two) times daily as needed (for skin irritation.).    Marland Kitchen cetirizine (ZYRTEC) 10 MG tablet Take 10 mg by mouth daily as needed for allergies.    Marland Kitchen ECHINACEA PO Take 760 mg by mouth daily.    . fluorometholone (FML) 0.1 % ophthalmic suspension Place 1 drop into both eyes 3 (three) times daily as needed (for irritated/itchy eyes.).    Marland Kitchen fluticasone (FLONASE) 50 MCG/ACT nasal spray Place 1 spray into both nostrils  at bedtime as needed for allergies or rhinitis.    . Glucosamine-Chondroitin (COSAMIN DS PO) Take 1 tablet by mouth 2 (two) times daily.    Marland Kitchen loratadine (CLARITIN) 10 MG tablet Take 10 mg by mouth daily as needed for allergies.    Marland Kitchen losartan (COZAAR) 50 MG tablet TAKE 1 TABLET BY MOUTH  DAILY 90 tablet 2  . metoprolol tartrate (LOPRESSOR) 25 MG tablet TAKE ONE-HALF TABLET BY  MOUTH TWICE DAILY 90 tablet 2  . Naphazoline-Pheniramine (OPCON-A) 0.027-0.315 % SOLN Place 1-2 drops into both eyes 3 (three) times daily as needed (for allergy eyes.).    Marland Kitchen NON FORMULARY Take 2 capsules by mouth 2 (two) times daily. DoTerra (Microplex VMz) 2 Capsule twice daily    . NON FORMULARY Take 2 capsules by mouth 2 (two) times daily. XE0 Mega "Doterra"    . oxyCODONE (OXY IR/ROXICODONE) 5 MG immediate release tablet Take 1 tablet (5 mg total) by mouth every 6 (six) hours as needed for moderate pain. 24 tablet 0  . sildenafil (REVATIO) 20 MG tablet Take 20 mg by mouth daily as needed (for ED).     . SUPER B COMPLEX/C PO Take 1 tablet by mouth daily.    Marland Kitchen triamcinolone cream (KENALOG) 0.1 % Apply 1 application topically daily as needed (FOR DRY SKIN/ITCHY SKIN.). Apply to area after bath (DO NOT APPLY TO FACE)  1   No current facility-administered medications for this visit.       Physical Exam: NONE DONE  There were no vitals taken for this visit.  Diagnostic Studies & Laboratory data:         Recent Radiology Findings:  CT ANGIO CHEST AORTA W/CM & OR WO/CM  Result Date: 12/17/2019 CLINICAL DATA:  Thoracic aortic aneurysm follow-up. EXAM: CT ANGIOGRAPHY CHEST WITH CONTRAST TECHNIQUE: Multidetector CT imaging of the chest was performed using the standard protocol during bolus administration of intravenous contrast. Multiplanar CT image reconstructions and MIPs were obtained to evaluate the vascular anatomy. CONTRAST:  55mL ISOVUE-370 IOPAMIDOL (ISOVUE-370) INJECTION 76% COMPARISON:  06/05/2018 FINDINGS:  Cardiovascular: Normal heart size. No pericardial effusion. Aortic valve replacement. Aortic diameters: Valve 21 mm Sinuses of Valsalva 45 mm Sino-tubular  junction 32 mm Ascending segment 34 mm Arch 26 mm Descending segment 25 mm Atherosclerotic calcification seen at the proximal LAD. Negative for aortic dissection Mediastinum/Nodes: No adenopathy or mass Lungs/Pleura: There is no edema, consolidation, effusion, or pneumothorax. 5 mm right lower lobe pulmonary nodule on 11:91 without significant change from comparison. Upper Abdomen: Hepatic steatosis even when accounting for contrast timing. Mild left upper pole cortical scarring and small renal calculus. Area of coarse calcification at the pancreatic body without superimposed abnormality, stable. Musculoskeletal: No acute or aggressive finding. Review of the MIP images confirms the above findings. IMPRESSION: 1. Unchanged aortic dimensions, including 45 mm diameter at the sinuses of Valsalva. 2. Left-sided coronary calcification. 3. Hepatic steatosis. Electronically Signed   By: Monte Fantasia M.D.   On: 12/17/2019 11:28   I have independently reviewed the above radiology studies  and reviewed the findings with the patient.   Ct Angio Chest Aorta W/cm &/or Wo/cm  Result Date: 06/05/2018 CLINICAL DATA:  F/U scan - aortic dilation Asymptomatic Hx aortic valve replacement Hx testicular ca - surgery, skin ca - excision Prior CXR in 2019 EXAM: CT ANGIOGRAPHY CHEST WITH CONTRAST TECHNIQUE: Multidetector CT imaging of the chest was performed using the standard protocol during bolus administration of intravenous contrast. Multiplanar CT image reconstructions and MIPs were obtained to evaluate the vascular anatomy. CONTRAST:  38mL ISOVUE-370 IOPAMIDOL (ISOVUE-370) INJECTION 76% COMPARISON:  03/19/2017 FINDINGS: Cardiovascular: Heart size normal. No pericardial effusion. Incomplete opacification of the pulmonary artery branches; the exam was not optimized for  detection of pulmonary emboli. Aortic valve replacement. Good contrast opacification of the thoracic aorta with transverse dimensions as follows: 4.5 cm sinuses of Valsalva 3.3 cm sino-tubular junction 3.4 cm ascending 3.3 cm arch 3.2 cm proximal descending 2.7 cm distal descending No dissection or stenosis. Classic 3 vessel brachiocephalic arterial origin anatomy without proximal stenosis. No significant atheromatous plaque. Mediastinum/Nodes: No hilar or mediastinal adenopathy. Lungs/Pleura: No pleural effusion. No pneumothorax. Lungs clear. Upper Abdomen: No acute findings Musculoskeletal: Previous median sternotomy. No fracture or worrisome bone lesions. Review of the MIP images confirms the above findings. IMPRESSION: 1. Stable changes of aortic valve replacement surgery without thoracic aortic aneurysm. Electronically Signed   By: Lucrezia Europe M.D.   On: 06/05/2018 12:00     Recent Labs: Lab Results  Component Value Date   WBC 9.2 05/22/2018   HGB 15.6 05/22/2018   HCT 47.9 05/22/2018   PLT 196 05/22/2018   GLUCOSE 112 (H) 05/15/2018   CHOL 165 03/30/2017   TRIG 91 03/30/2017   HDL 24 (L) 03/30/2017   LDLDIRECT 160.9 02/12/2013   LDLCALC 123 (H) 03/30/2017   ALT 13 (L) 05/30/2017   AST 19 05/30/2017   NA 139 05/15/2018   K 4.3 05/15/2018   CL 106 05/15/2018   CREATININE 1.11 05/22/2018   BUN 20 05/15/2018   CO2 23 05/15/2018   TSH 3.911 03/29/2017   INR 1.38 06/05/2017   HGBA1C 5.2 05/30/2017   Echocardiography  Patient:    [name] Anderson, [name] Anderson MR #:       [id number] Study Date: 07/30/2017 Gender:     M Age:        64 Height:     180.3 cm Weight:     92.1 kg BSA:        2.17 m^2 Pt. Status: Room:   [facility]  SONOGRAPHER  Oletta Lamas, Will  PERFORMING   [facility],  Outpatient  ATTENDING    Skeet Latch, MD  cc:  ------------------------------------------------------------------- LV EF: 50% -    55%  ------------------------------------------------------------------- Indications:      (Z95.2).  ------------------------------------------------------------------- History:   PMH:  S/p bioprosthetic AVR (74mm Magna Ease). Dilated aortic root.. Acquired from the patient and from the patient&'s chart.  Risk factors:  Former tobacco use. Dyslipidemia.  ------------------------------------------------------------------- Study Conclusions  - Left ventricle: The cavity size was normal. There was moderate   concentric hypertrophy. Systolic function was normal. The   estimated ejection fraction was in the range of 50% to 55%. Mild   hypokinesis of the anteroseptal, inferior, and inferoseptal   myocardium. Doppler parameters are consistent with abnormal left   ventricular relaxation (grade 1 diastolic dysfunction). Doppler   parameters are consistent with indeterminate ventricular filling   pressure. - Aortic valve: A 3mm Edwards Magna Ease bioprosthesis was present   and functioning properly. There was no regurgitation. Peak   velocity (S): 238 cm/s. Mean gradient (S): 13 mm Hg. - Aorta: Ascending aortic diameter: 39 mm (S). - Ascending aorta: The ascending aorta was mildly dilated. - Mitral valve: There was mild regurgitation. - Right ventricle: The cavity size was normal. Wall thickness was   normal. Systolic function was normal. - Tricuspid valve: There was mild regurgitation. - Pulmonary arteries: Systolic pressure was within the normal   range. PA peak pressure: 20 mm Hg (S).  ------------------------------------------------------------------- Study data:  Comparison was made to the study of 03/19/2017.  Study status:  Routine.  Procedure:  The patient reported no pain pre or post test. Transthoracic echocardiography for left ventricular function evaluation and for assessment of valvular function. Image quality was adequate.  Study completion:  There were  no complications.          Echocardiography.  M-mode, complete 2D, spectral Doppler, and color Doppler.  Birthdate:  Patient birthdate: 1955-08-14.  Age:  Patient is 64 yr old.  Sex:  Gender: male.    BMI: 28.3 kg/m^2.  Blood pressure:     137/99  Patient status:  Outpatient.  Study date:  Study date: 07/30/2017. Study time: 08:49 AM.  Location:  [facility]  -------------------------------------------------------------------  ------------------------------------------------------------------- Left ventricle:  The cavity size was normal. There was moderate concentric hypertrophy. Systolic function was normal. The estimated ejection fraction was in the range of 50% to 55%.  Regional wall motion abnormalities:   Mild hypokinesis of the anteroseptal, inferior, and inferoseptal myocardium. Doppler parameters are consistent with abnormal left ventricular relaxation (grade 1 diastolic dysfunction). Doppler parameters are consistent with indeterminate ventricular filling pressure.  ------------------------------------------------------------------- Aortic valve:  A 53mm Edwards Magna Ease bioprosthesis was present and functioning properly. Mobility was not restricted.  Doppler: Transvalvular velocity was minimally increased. There was no stenosis. There was no regurgitation.    VTI ratio of LVOT to aortic valve: 0.36. Valve area (VTI): 1.49 cm^2. Indexed valve area (VTI): 0.69 cm^2/m^2. Peak velocity ratio of LVOT to aortic valve: 0.36. Valve area (Vmax): 1.49 cm^2. Indexed valve area (Vmax): 0.69 cm^2/m^2. Mean velocity ratio of LVOT to aortic valve: 0.36. Valve area (Vmean): 1.5 cm^2. Indexed valve area (Vmean): 0.69 cm^2/m^2.   Mean gradient (S): 13 mm Hg. Peak gradient (S): 23 mm Hg.  ------------------------------------------------------------------- Aorta:  Aortic root: The aortic root was normal in size. Ascending aorta: The ascending aorta was mildly  dilated.  ------------------------------------------------------------------- Mitral valve:   Structurally normal valve.   Mobility was not restricted.  Doppler:  Transvalvular velocity was within  the normal range. There was no evidence for stenosis. There was mild regurgitation.  ------------------------------------------------------------------- Left atrium:  The atrium was normal in size.  ------------------------------------------------------------------- Right ventricle:  The cavity size was normal. Wall thickness was normal. Systolic function was normal.  ------------------------------------------------------------------- Pulmonic valve:    Structurally normal valve.   Cusp separation was normal.  Doppler:  Transvalvular velocity was within the normal range. There was no evidence for stenosis. There was no regurgitation.  ------------------------------------------------------------------- Tricuspid valve:   Structurally normal valve.    Doppler: Transvalvular velocity was within the normal range. There was mild regurgitation.  ------------------------------------------------------------------- Pulmonary artery:   The main pulmonary artery was normal-sized. Systolic pressure was within the normal range.  ------------------------------------------------------------------- Right atrium:  The atrium was normal in size.  ------------------------------------------------------------------- Pericardium:  There was no pericardial effusion.  ------------------------------------------------------------------- Systemic veins: Inferior vena cava: The vessel was normal in size. The respirophasic diameter changes were in the normal range (>= 50%), consistent with normal central venous pressure.  ------------------------------------------------------------------- Measurements   Left ventricle                            Value          Reference  LV ID, ED, PLAX chordal            (L)     36.9  mm       43 - 52  LV ID, ES, PLAX chordal                   27.7  mm       23 - 38  LV fx shortening, PLAX chordal    (L)     25    %        >=29  LV PW thickness, ED                       14.8  mm       ---------  IVS/LV PW ratio, ED                       0.97           <=1.3  Stroke volume, 2D                         71    ml       ---------  Stroke volume/bsa, 2D                     33    ml/m^2   ---------  LV ejection fraction, 1-p A4C             50    %        ---------  LV end-diastolic volume, 2-p              88    ml       ---------  LV end-systolic volume, 2-p               48    ml       ---------  LV ejection fraction, 2-p                 45    %        ---------  Stroke volume, 2-p  40    ml       ---------  LV end-diastolic volume/bsa, 2-p          41    ml/m^2   ---------  LV end-systolic volume/bsa, 2-p           22    ml/m^2   ---------  Stroke volume/bsa, 2-p                    18.5  ml/m^2   ---------  LV e&', lateral                            8.38  cm/s     ---------  LV E/e&', lateral                          5.18           ---------  LV e&', medial                             4.09  cm/s     ---------  LV E/e&', medial                           10.61          ---------  LV e&', average                            6.24  cm/s     ---------  LV E/e&', average                          6.96           ---------    Ventricular septum                        Value          Reference  IVS thickness, ED                         14.31 mm       ---------    LVOT                                      Value          Reference  LVOT ID, S                                23    mm       ---------  LVOT area                                 4.15  cm^2     ---------  LVOT ID                                   23    mm       ---------  LVOT peak velocity, S  85.7  cm/s     ---------  LVOT mean velocity, S                      60.8  cm/s     ---------  LVOT VTI, S                               17.1  cm       ---------  LVOT peak gradient, S                     3     mm Hg    ---------  Stroke volume (SV), LVOT DP               71    ml       ---------  Stroke index (SV/bsa), LVOT DP            32.8  ml/m^2   ---------    Aortic valve                              Value          Reference  Aortic valve peak velocity, S             238   cm/s     ---------  Aortic valve mean velocity, S             168   cm/s     ---------  Aortic valve VTI, S                       47.6  cm       ---------  Aortic mean gradient, S                   13    mm Hg    ---------  Aortic peak gradient, S                   23    mm Hg    ---------  VTI ratio, LVOT/AV                        0.36           ---------  Aortic valve area, VTI                    1.49  cm^2     ---------  Aortic valve area/bsa, VTI                0.69  cm^2/m^2 ---------  Velocity ratio, peak, LVOT/AV             0.36           ---------  Aortic valve area, peak velocity          1.49  cm^2     ---------  Aortic valve area/bsa, peak               0.69  cm^2/m^2 ---------  velocity  Velocity ratio, mean, LVOT/AV             0.36           ---------  Aortic valve area, mean velocity          1.5   cm^2     ---------  Aortic valve area/bsa, mean               0.69  cm^2/m^2 ---------  velocity    Aorta                                     Value          Reference  Aortic root ID, ED                        39    mm       ---------  Ascending aorta ID, A-P, S                39    mm       ---------    Left atrium                               Value          Reference  LA ID, A-P, ES                            30    mm       ---------  LA ID/bsa, A-P                            1.39  cm/m^2   <=2.2  LA volume, S                              47    ml       ---------  LA volume/bsa, S                          21.7  ml/m^2   ---------  LA volume, ES, 1-p A4C                     36    ml       ---------  LA volume/bsa, ES, 1-p A4C                16.6  ml/m^2   ---------  LA volume, ES, 1-p A2C                    56    ml       ---------  LA volume/bsa, ES, 1-p A2C                25.9  ml/m^2   ---------    Mitral valve                              Value          Reference  Mitral E-wave peak velocity               43.4  cm/s     ---------  Mitral A-wave peak velocity               69.6  cm/s     ---------  Mitral deceleration time          (H)  419   ms       150 - 230  Mitral E/A ratio, peak                    0.6            ---------    Pulmonary arteries                        Value          Reference  PA pressure, S, DP                        20    mm Hg    <=30    Tricuspid valve                           Value          Reference  Tricuspid regurg peak velocity            204   cm/s     ---------  Tricuspid peak RV-RA gradient             17    mm Hg    ---------    Systemic veins                            Value          Reference  Estimated CVP                             3     mm Hg    ---------    Right ventricle                           Value          Reference  RV pressure, S, DP                        20    mm Hg    <=30  RV s&', lateral, S                         10.5  cm/s     ---------  Legend: (L)  and  (H)  mark values outside specified reference range.  ------------------------------------------------------------------- Prepared and Electronically Authenticated by  Skeet Latch, MD 2019-04-02T13:31:20   Assessment / Plan:   Stable following tissue valve aortic valve replacement for worsening aortic insufficiency due to endocarditis February 2019.  Stable changes of aortic valve replacement surgery without thoracic aortic aneurysm. Aortic root 4.4 cm.  With the patient's prosthetic heart valve the risks of endocarditis have been discussed. The recommendations for periprocedural antibiotics including dental  procedures have been discussed with the patient.  PLAN FOLLOW UP CTA CHEST 18 MONTHS   DUE TO THE PROBLEMS with office (2, CT scan The patient having to go to a separate site and epic not working at the time patient was to have a visit I have just called him on the phone to review the findings of the CT scan and how he is been feeling.  He was satisfied with a phone call and deferred coming to the office.  He did note that he  has an upcoming dental appointment has already ordered his antibiotics to take at a time.  [name] 12/17/2019 1:09 PM

## 2020-03-04 ENCOUNTER — Other Ambulatory Visit: Payer: Self-pay | Admitting: Cardiology

## 2020-05-02 NOTE — Progress Notes (Deleted)
HPI: FUAVR.Echocardiogram repeated November 2018. Ejection fraction 50-55%, severe left ventricular enlargement, grade 1 diastolic dysfunction, severe aortic insufficiency, dilated aortic root at 46 mm, mild mitral regurgitation and mild left atrial enlargement. CTA of thoracic aorta repeated November 2018. The aortic root measured 4.7 cm. Transesophageal echocardiogram November 2018 showed normal LV function, aortic valve vegetation on left coronary cusp, severe aortic insufficiency, mildly dilated aortic root, redundant mitral valve chordae and associated vegetation could not be excluded, mild mitral regurgitation. Blood cultures grew coag negative staph. Patient placed on antibiotics with plans for aortic valve/aortic root replacement after completing 6 weeks. Cardiac catheterization12/20 showed normal coronary arteries and normal right and left heart pressures. Preoperative carotid Dopplers January 2019 showed no stenosis. Patient underwent aortic valve replacement(bioprosthetic AVR)on June 05, 2017. The aortic root was not replaced as it measured 4.1 cm at time of surgery. Patient did have postoperative atrial fibrillation treated with amiodarone. Echocardiogram April 2019 showed ejection fraction 50 to 55%, grade 1 diastolic dysfunction, bioprosthetic aortic valve with no aortic insufficiency and mean gradient 13 mmHg, ascending aorta 39 mm, mild mitral regurgitation. Chest CTA August 2021 showed stable changes of aortic valve replacement; aortic root 4.5 cm.  Since last seen,  Current Outpatient Medications  Medication Sig Dispense Refill  . acetaminophen (TYLENOL) 500 MG tablet Take 1 tablet (500 mg total) by mouth every 6 (six) hours as needed (for pain.). 30 tablet 0  . aluminum chloride (HYPERCARE) 20 % external solution Apply 1 application topically 3 (three) times daily as needed (for sweating.).    Marland Kitchen Ascorbic Acid (VITAMIN C) 1000 MG tablet Take 1,000 mg by mouth daily.    Marland Kitchen  aspirin EC 81 MG tablet Take 81 mg by mouth daily.    Marland Kitchen atorvastatin (LIPITOR) 10 MG tablet Take 1 tablet (10 mg total) by mouth daily at 6 PM. 30 tablet 6  . betamethasone valerate (VALISONE) 0.1 % cream Apply 1 application topically 2 (two) times daily as needed (for skin irritation.).    Marland Kitchen cetirizine (ZYRTEC) 10 MG tablet Take 10 mg by mouth daily as needed for allergies.    Marland Kitchen ECHINACEA PO Take 760 mg by mouth daily.    . fluorometholone (FML) 0.1 % ophthalmic suspension Place 1 drop into both eyes 3 (three) times daily as needed (for irritated/itchy eyes.).    Marland Kitchen fluticasone (FLONASE) 50 MCG/ACT nasal spray Place 1 spray into both nostrils at bedtime as needed for allergies or rhinitis.    . Glucosamine-Chondroitin (COSAMIN DS PO) Take 1 tablet by mouth 2 (two) times daily.    Marland Kitchen loratadine (CLARITIN) 10 MG tablet Take 10 mg by mouth daily as needed for allergies.    Marland Kitchen losartan (COZAAR) 50 MG tablet TAKE 1 TABLET BY MOUTH  DAILY 90 tablet 2  . metoprolol tartrate (LOPRESSOR) 25 MG tablet TAKE ONE-HALF TABLET BY  MOUTH TWICE DAILY 90 tablet 3  . Naphazoline-Pheniramine (OPCON-A) 0.027-0.315 % SOLN Place 1-2 drops into both eyes 3 (three) times daily as needed (for allergy eyes.).    Marland Kitchen NON FORMULARY Take 2 capsules by mouth 2 (two) times daily. DoTerra (Microplex VMz) 2 Capsule twice daily    . NON FORMULARY Take 2 capsules by mouth 2 (two) times daily. XE0 Mega "Doterra"    . oxyCODONE (OXY IR/ROXICODONE) 5 MG immediate release tablet Take 1 tablet (5 mg total) by mouth every 6 (six) hours as needed for moderate pain. 24 tablet 0  . sildenafil (REVATIO) 20 MG  tablet Take 20 mg by mouth daily as needed (for ED).     . SUPER B COMPLEX/C PO Take 1 tablet by mouth daily.    Marland Kitchen triamcinolone cream (KENALOG) 0.1 % Apply 1 application topically daily as needed (FOR DRY SKIN/ITCHY SKIN.). Apply to area after bath (DO NOT APPLY TO FACE)  1   No current facility-administered medications for this visit.      Past Medical History:  Diagnosis Date  . Allergy   . Anemia   . Barrett's esophagus - short segment 03/13/2017  . Cancer ([place]) 09/11/13   testicular  . Complication of anesthesia    hard to wake  - 20 years ago  . ED (erectile dysfunction)   . Endocarditis    Archie Endo 03/29/2017  . H/O aneurysm 03/05/2017  . High cholesterol   . History of basal cell carcinoma excision    02/2002   --  NOSE  &   2013  RIGHT EAR  . History of kidney stones   . Hyperlipidemia   . Hypertension   . Mild obstructive sleep apnea    PER STUDY 01-18-2005--  NO CPAP OR MOUTH GUARD  . Moderate aortic valve insufficiency   . Multiple pulmonary nodules    PER CT--  PROBABLE  GRANULOMATOUS - patient is not aware of this  . Personal history of colonic polyps    TUBULAR ADENOMA--   . Seasonal allergies   . 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[place])   . Testicular mass    RIGHT    Past Surgical History:  Procedure Laterality Date  . AORTIC VALVE REPLACEMENT N/A 06/05/2017   Procedure: AORTIC VALVE REPLACEMENT (AVR) using 59mm Magna Ease valve;  Surgeon: [name], MD;  Location: [place];  Service: Open Heart Surgery;  Laterality: N/A;  . COLONOSCOPY  01/24/06, 07/10/11  . COLONOSCOPY WITH ESOPHAGOGASTRODUODENOSCOPY (EGD)    . EXTRACORPOREAL SHOCK WAVE LITHOTRIPSY    . HERNIA REPAIR    . MOHS SURGERY  02/2002  &  2013   TIP OF NOSE-///     RIGHT EAR  . ORCHIECTOMY Right 09/11/2013   Procedure:  RIGHT ORCHIECTOMY;  Surgeon: Jorja Loa, MD;  Location: [facility];  Service: Urology;  Laterality: Right;  . RIGHT/LEFT HEART CATH AND CORONARY ANGIOGRAPHY N/A 04/18/2017   Procedure: RIGHT/LEFT HEART CATH AND CORONARY ANGIOGRAPHY;  Surgeon: Sherren Mocha, MD;  Location: [place] CV LAB;  Service: Cardiovascular;  Laterality: N/A;  . SCROTAL EXPLORATION Right 09/11/2013   Procedure: RIGHT INGUINAL EXPLORATION;  Surgeon: Jorja Loa, MD;  Location[facility];  Service:  Urology;  Laterality: Right;  . TEE WITHOUT CARDIOVERSION N/A 03/29/2017   Procedure: TRANSESOPHAGEAL ECHOCARDIOGRAM (TEE);  Surgeon: Lelon Perla, MD;  [facility]COPY;  Service: Cardiovascular;  Laterality: N/A;  . TEE WITHOUT CARDIOVERSION N/A 06/05/2017   Procedure: TRANSESOPHAGEAL ECHOCARDIOGRAM (TEE);  Surgeon: [name], MD;  Location: [place];  Service: Open Heart Surgery;  Laterality: N/A;  . TRANSTHORACIC ECHOCARDIOGRAM  02-25-2013   DR Cavon Nicolls   NORMAL LVSF/  EF 55-60%/   GRADE 2 DIASTOLIC DYSFUNCTION/  ECCENTRIC MODERATE AORTIC INSUFFICIENCY WITHOUT STENOSIS/  MILD DILATED AORTIC ROOT/ TRIVIAL MR, TR,  & PR  . UMBILICAL HERNIA REPAIR  06-08-2009  . UMBILICAL HERNIA REPAIR  05/22/2018   WITH MESH  . UMBILICAL HERNIA REPAIR N/A 05/22/2018   Procedure: LAPAROSCOPIC REPAIR OF RECURRENT UMBILICAL HERNIA WITH MESH;  Surgeon: Donnie Mesa, MD;  Location: [place];  Service:  General;  Laterality: N/A;  . URETEROSCOPIC LASER LITHOTRIPSY STONE EXTRACTION  2007    Social History   Socioeconomic History  . Marital status: Married    Spouse name: Coralyn Mark  . Number of children: 2  . Years of education: 63  . Highest education level: Not on file  Occupational History  . 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 Smoking status: Current Some Day Smoker    Types: Pipe  . Smokeless tobacco: Former Systems developer    Types: [place] date: 04/26/2011  . Tobacco comment: OCCASIONAL  PIPE SMOKER  (NEVER SMOKED CIGARETTES)  Vaping Use  . Vaping Use: Never used  Substance and Sexual Activity  . Alcohol use: Yes    Comment: RARE  . Drug use: No  . Sexual activity: Yes  Other Topics Concern  . Not on file  Social History Narrative   Lives with wife Coralyn Mark   Caffeine use: coffee- 1 cup per day   Social Determinants of Health   Financial Resource Strain: Not on file  Food Insecurity: Not on file  Transportation Needs: Not on file  Physical Activity: Not on file  Stress:  Not on file  Social Connections: Not on file  Intimate Partner Violence: Not on file    Family History  Problem Relation Age of Onset  . Colon cancer Father   . Heart disease Paternal Grandmother     ROS: no fevers or chills, productive cough, hemoptysis, dysphasia, odynophagia, melena, hematochezia, dysuria, hematuria, rash, seizure activity, orthopnea, PND, pedal edema, claudication. Remaining systems are negative.  Physical Exam: Well-developed well-nourished in no acute distress.  Skin is warm and dry.  HEENT is normal.  Neck is supple.  Chest is clear to auscultation with normal expansion.  Cardiovascular exam is regular rate and rhythm.  Abdominal exam nontender or distended. No masses palpated. Extremities show no edema. neuro grossly intact  ECG- personally reviewed  A/P  1 status post aortic valve replacement-continue SBE prophylaxis.  2 dilated aortic root-plan follow-up CTA August 2022.  3 hypertension-patient's blood pressure is controlled today.  Continue present medications and follow.  4 hyperlipidemia-continue statin.  Kirk Ruths, MD

## 2020-05-12 ENCOUNTER — Encounter: Payer: Self-pay | Admitting: *Deleted

## 2020-05-13 NOTE — Progress Notes (Signed)
Virtual Visit via Video Note   This visit type was conducted due to national recommendations for restrictions regarding the COVID-19 Pandemic (e.g. social distancing) in an effort to limit this patient's exposure and mitigate transmission in our community.  Due to his co-morbid illnesses, this patient is at least at moderate risk for complications without adequate follow up.  This format is felt to be most appropriate for this patient at this time.  All issues noted in this document were discussed and addressed.  A limited physical exam was performed with this format.  Please refer to the patient's chart for his consent to telehealth for [facility].      Date:  05/16/2020   ID:  [name] Anderson, DOB [birth year], MRN [id number]  Patient Location:Home Provider Location: Home  PCP:  Antony Contras, MD  Cardiologist:  Dr Stanford Breed  Evaluation Performed:  Follow-Up Visit  Chief Complaint:  FU AVR  History of Present Illness:    FUAVR.Echocardiogram 11/18 showed Ejection fraction 50-55%, severe left ventricular enlargement, grade 1 diastolic dysfunction, severe aortic insufficiency, dilated aortic root at 46 mm, mild mitral regurgitation and mild left atrial enlargement. CTA of thoracic aorta repeated November 2018. The aortic root measured 4.7 cm. Transesophageal echocardiogram November 2018 showed normal LV function, aortic valve vegetation on left coronary cusp, severe aortic insufficiency, mildly dilated aortic root, redundant mitral valve chordae and associated vegetation could not be excluded, mild mitral regurgitation. Blood cultures grew coag negative staph. Patient placed on antibiotics with plans for aortic valve/aortic root replacement after completing 6 weeks. Cardiac catheterization12/20 showed normal coronary arteries and normal right and left heart pressures. Preoperative carotid Dopplers January 2019 showed no stenosis. Patient underwent aortic valve replacement (bioprosthetic  AVR)on June 05, 2017. The aortic root was not replaced as it measured 4.1 cm at time of surgery. Patient did have postoperative atrial fibrillation treated with amiodarone. Echocardiogram April 2019 showed ejection fraction 50 to [id number], grade 1 diastolic dysfunction, bioprosthetic aortic valve with no aortic insufficiency and mean gradient 13 mmHg, ascending aorta 39 mm, mild mitral regurgitation. Chest CTA August 2021 showed stable changes of aortic valve replacement; aortic root 4.5 cm.  Since last seen,the patient has dyspnea with more extreme activities but not with routine activities. It is relieved with rest. It is not associated with chest pain. There is no orthopnea, PND or pedal edema. There is no syncope or palpitations. There is no exertional chest pain.    The patient does not have symptoms concerning for COVID-19 infection (fever, chills, cough, or new shortness of breath).    Past Medical History:  Diagnosis Date  . Allergy   . Anemia   . Barrett's esophagus - short segment 03/13/2017  . Cancer ([place]) 09/11/13   testicular  . Complication of anesthesia    hard to wake  - 20 years ago  . ED (erectile dysfunction)   . Endocarditis    Archie Endo 03/29/2017  . H/O aneurysm 03/05/2017  . High cholesterol   . History of basal cell carcinoma excision    02/2002   --  NOSE  &   2013  RIGHT EAR  . History of kidney stones   . Hyperlipidemia   . Hypertension   . Mild obstructive sleep apnea    PER STUDY 01-18-2005--  NO CPAP OR MOUTH GUARD  . Moderate aortic valve insufficiency   . Multiple pulmonary nodules    PER CT--  PROBABLE  GRANULOMATOUS - patient is not aware of this  .  Personal history of colonic polyps    TUBULAR ADENOMA--   . Seasonal allergies   . 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[place])   . Testicular mass    RIGHT   Past Surgical History:  Procedure Laterality Date  . AORTIC VALVE REPLACEMENT N/A 06/05/2017   Procedure: AORTIC VALVE REPLACEMENT (AVR) using 79mm Magna Ease  valve;  Surgeon: [name], MD;  Location: [place];  Service: Open Heart Surgery;  Laterality: N/A;  . COLONOSCOPY  01/24/06, 07/10/11  . COLONOSCOPY WITH ESOPHAGOGASTRODUODENOSCOPY (EGD)    . EXTRACORPOREAL SHOCK WAVE LITHOTRIPSY    . HERNIA REPAIR    . MOHS SURGERY  02/2002  &  2013   TIP OF NOSE-///     RIGHT EAR  . ORCHIECTOMY Right 09/11/2013   Procedure:  RIGHT ORCHIECTOMY;  Surgeon: Jorja Loa, MD;  Location: [facility];  Service: Urology;  Laterality: Right;  . RIGHT/LEFT HEART CATH AND CORONARY ANGIOGRAPHY N/A 04/18/2017   Procedure: RIGHT/LEFT HEART CATH AND CORONARY ANGIOGRAPHY;  Surgeon: Sherren Mocha, MD;  Location: [place] CV LAB;  Service: Cardiovascular;  Laterality: N/A;  . SCROTAL EXPLORATION Right 09/11/2013   Procedure: RIGHT INGUINAL EXPLORATION;  Surgeon: Jorja Loa, MD;  Location: [facility];  Service: Urology;  Laterality: Right;  . TEE WITHOUT CARDIOVERSION N/A 03/29/2017   Procedure: TRANSESOPHAGEAL ECHOCARDIOGRAM (TEE);  Surgeon: Lelon Perla, MD;  Location[facility] ENDOSCOPY;  Service: Cardiovascular;  Laterality: N/A;  . TEE WITHOUT CARDIOVERSION N/A 06/05/2017   Procedure: TRANSESOPHAGEAL ECHOCARDIOGRAM (TEE);  Surgeon: [name], MD;  Location: [place];  Service: Open Heart Surgery;  Laterality: N/A;  . TRANSTHORACIC ECHOCARDIOGRAM  02-25-2013   DR Chizaram Latino   NORMAL LVSF/  EF 55-60%/   GRADE 2 DIASTOLIC DYSFUNCTION/  ECCENTRIC MODERATE AORTIC INSUFFICIENCY WITHOUT STENOSIS/  MILD DILATED AORTIC ROOT/ TRIVIAL MR, TR,  & PR  . UMBILICAL HERNIA REPAIR  06-08-2009  . UMBILICAL HERNIA REPAIR  05/22/2018   WITH MESH  . UMBILICAL HERNIA REPAIR N/A 05/22/2018   Procedure: LAPAROSCOPIC REPAIR OF RECURRENT UMBILICAL HERNIA WITH MESH;  Surgeon: Donnie Mesa, MD;  Location: [place];  Service: General;  Laterality: N/A;  . URETEROSCOPIC LASER LITHOTRIPSY STONE EXTRACTION  2007     Current Meds  Medication Sig   . acetaminophen (TYLENOL) 500 MG tablet Take 1 tablet (500 mg total) by mouth every 6 (six) hours as needed (for pain.).  Marland Kitchen aluminum chloride (DRYSOL) 20 % external solution Apply 1 application topically 3 (three) times daily as needed (for sweating.).  Marland Kitchen Ascorbic Acid (VITAMIN C) 1000 MG tablet Take 1,000 mg by mouth daily.  Marland Kitchen aspirin EC 81 MG tablet Take 81 mg by mouth daily.  Marland Kitchen atorvastatin (LIPITOR) 10 MG tablet Take 1 tablet (10 mg total) by mouth daily at 6 PM.  . betamethasone valerate (VALISONE) 0.1 % cream Apply 1 application topically 2 (two) times daily as needed (for skin irritation.).  Marland Kitchen cetirizine (ZYRTEC) 10 MG tablet Take 10 mg by mouth daily as needed for allergies.  Marland Kitchen ECHINACEA PO Take 760 mg by mouth daily.  . fluorometholone (FML) 0.1 % ophthalmic suspension Place 1 drop into both eyes 3 (three) times daily as needed (for irritated/itchy eyes.).  Marland Kitchen fluticasone (FLONASE) 50 MCG/ACT nasal spray Place 1 spray into both nostrils at bedtime as needed for allergies or rhinitis.  Marland Kitchen loratadine (CLARITIN) 10 MG tablet Take 10 mg by mouth daily as needed for allergies.  Marland Kitchen losartan (COZAAR) 50 MG tablet  TAKE 1 TABLET BY MOUTH  DAILY  . metoprolol tartrate (LOPRESSOR) 25 MG tablet TAKE ONE-HALF TABLET BY  MOUTH TWICE DAILY  . Naphazoline-Pheniramine 0.027-0.315 % SOLN Place 1-2 drops into both eyes 3 (three) times daily as needed (for allergy eyes.).  Marland Kitchen NON FORMULARY Take 2 capsules by mouth 2 (two) times daily. DoTerra (Microplex VMz) 2 Capsule twice daily  . NON FORMULARY Take 2 capsules by mouth 2 (two) times daily. XE0 Mega "Doterra"  . oxyCODONE (OXY IR/ROXICODONE) 5 MG immediate release tablet Take 1 tablet (5 mg total) by mouth every 6 (six) hours as needed for moderate pain.  . sildenafil (REVATIO) 20 MG tablet Take 20 mg by mouth daily as needed (for ED).   . SUPER B COMPLEX/C PO Take 1 tablet by mouth daily.  Marland Kitchen triamcinolone cream (KENALOG) 0.1 % Apply 1 application topically  daily as needed (FOR DRY SKIN/ITCHY SKIN.). Apply to area after bath (DO NOT APPLY TO FACE)     Allergies:   Patient has no known allergies.   Social History   Tobacco Use  . Smoking status: Current Some Day Smoker    Types: Pipe  . Smokeless tobacco: Former Systems developer    Types: [place] date: 04/26/2011  . Tobacco comment: OCCASIONAL  PIPE SMOKER  (NEVER SMOKED CIGARETTES)  Vaping Use  . Vaping Use: Never used  Substance Use Topics  . Alcohol use: Yes    Comment: RARE  . Drug use: No     Family Hx: The patient's family history includes Colon cancer in his father; Heart disease in his paternal grandmother.  ROS:   Please see the history of present illness.    No Fever, chills  or productive cough All other systems reviewed and are negative.  Recent Lipid Panel Lab Results  Component Value Date/Time   CHOL 165 03/30/2017 03:53 AM   TRIG 91 03/30/2017 03:53 AM   HDL 24 (L) 03/30/2017 03:53 AM   CHOLHDL 6.9 03/30/2017 03:53 AM   LDLCALC 123 (H) 03/30/2017 03:53 AM   LDLDIRECT 160.9 02/12/2013 07:34 AM    Wt Readings from Last 3 Encounters:  05/16/20 220 lb (99.8 kg)  04/17/19 230 lb (104.3 kg)  06/05/18 225 lb (102.1 kg)     Objective:    Vital Signs:  BP 122/90   Pulse (!) 109   Ht 5\' 11"  (1.803 m)   Wt 220 lb (99.8 kg)   BMI 30.68 kg/m    VITAL SIGNS:  reviewed NAD Answers questions appropriately Normal affect Remainder of physical examination not performed (telehealth visit; coronavirus pandemic)  ASSESSMENT & PLAN:    1 status post aortic valve replacement-continue SBE prophylaxis.  2 dilated aortic root-plan follow-up CTA August 2022.  3 hypertension-patient's blood pressure is controlled today.  Continue present medications and follow.  4 hyperlipidemia-continue statin.   COVID-19 Education: The importance of social distancing was discussed today.  Time:   Today, I have spent 16 minutes with the patient with telehealth technology  discussing the above problems.     Medication Adjustments/Labs and Tests Ordered: Current medicines are reviewed at length with the patient today.  Concerns regarding medicines are outlined above.   Tests Ordered: No orders of the defined types were placed in this encounter.   Medication Changes: No orders of the defined types were placed in this encounter.   Follow Up:  In Person in 1 year(s)  Signed, Kirk Ruths, MD  05/16/2020 8:11 AM  [facility]

## 2020-05-16 ENCOUNTER — Encounter: Payer: Self-pay | Admitting: Cardiology

## 2020-05-16 ENCOUNTER — Telehealth (INDEPENDENT_AMBULATORY_CARE_PROVIDER_SITE_OTHER): Payer: 59 | Admitting: Cardiology

## 2020-05-16 VITALS — BP 122/90 | HR 109 | Ht 71.0 in | Wt 220.0 lb

## 2020-05-16 DIAGNOSIS — I10 Essential (primary) hypertension: Secondary | ICD-10-CM

## 2020-05-16 DIAGNOSIS — Z952 Presence of prosthetic heart valve: Secondary | ICD-10-CM | POA: Diagnosis not present

## 2020-05-16 DIAGNOSIS — I77810 Thoracic aortic ectasia: Secondary | ICD-10-CM

## 2020-05-16 DIAGNOSIS — E7800 Pure hypercholesterolemia, unspecified: Secondary | ICD-10-CM | POA: Diagnosis not present

## 2020-05-16 NOTE — Patient Instructions (Signed)

## 2020-06-07 ENCOUNTER — Other Ambulatory Visit: Payer: Self-pay | Admitting: Urology

## 2020-06-07 DIAGNOSIS — N200 Calculus of kidney: Secondary | ICD-10-CM

## 2020-06-30 NOTE — Progress Notes (Signed)
Patient to arrive at [facility] on 07/04/2020. History and medications reviewed. All pre-procedure instructions given. NPO after MN on Sunday except for clear liquids until 0445. BP medications with sip of water in AM of procedure. Aortic valve replacement in 2019. Denies chest pain or SOB in the last 3 months. Has not had an EKG in the past year. [facility] made aware. Driver secured.

## 2020-07-01 ENCOUNTER — Other Ambulatory Visit (HOSPITAL_COMMUNITY)
Admission: RE | Admit: 2020-07-01 | Discharge: 2020-07-01 | Disposition: A | Discharge status: Home / Self Care | Payer: 59 | Source: Ambulatory Visit | Attending: Urology | Admitting: Urology

## 2020-07-01 DIAGNOSIS — Z01812 Encounter for preprocedural laboratory examination: Secondary | ICD-10-CM | POA: Insufficient documentation

## 2020-07-01 DIAGNOSIS — Z20822 Contact with and (suspected) exposure to covid-19: Secondary | ICD-10-CM | POA: Diagnosis not present

## 2020-07-01 LAB — SARS CORONAVIRUS 2 (TAT 6-24 HRS): SARS Coronavirus 2: NEGATIVE

## 2020-07-04 ENCOUNTER — Ambulatory Visit (HOSPITAL_BASED_OUTPATIENT_CLINIC_OR_DEPARTMENT_OTHER)
Admission: RE | Admit: 2020-07-04 | Discharge: 2020-07-04 | Disposition: A | Discharge status: Home / Self Care | Payer: 59 | Attending: Urology | Admitting: Urology

## 2020-07-04 ENCOUNTER — Ambulatory Visit (HOSPITAL_COMMUNITY): Payer: 59

## 2020-07-04 ENCOUNTER — Encounter (HOSPITAL_BASED_OUTPATIENT_CLINIC_OR_DEPARTMENT_OTHER)
Admission: RE | Disposition: A | Discharge status: Home / Self Care | Payer: Self-pay | Source: Home / Self Care | Attending: Urology

## 2020-07-04 ENCOUNTER — Encounter (HOSPITAL_BASED_OUTPATIENT_CLINIC_OR_DEPARTMENT_OTHER): Payer: Self-pay | Admitting: Urology

## 2020-07-04 ENCOUNTER — Other Ambulatory Visit: Payer: Self-pay

## 2020-07-04 DIAGNOSIS — G4730 Sleep apnea, unspecified: Secondary | ICD-10-CM | POA: Diagnosis not present

## 2020-07-04 DIAGNOSIS — N200 Calculus of kidney: Secondary | ICD-10-CM | POA: Diagnosis not present

## 2020-07-04 DIAGNOSIS — Z952 Presence of prosthetic heart valve: Secondary | ICD-10-CM | POA: Insufficient documentation

## 2020-07-04 DIAGNOSIS — I10 Essential (primary) hypertension: Secondary | ICD-10-CM | POA: Insufficient documentation

## 2020-07-04 DIAGNOSIS — Z720 Tobacco use: Secondary | ICD-10-CM | POA: Insufficient documentation

## 2020-07-04 HISTORY — PX: EXTRACORPOREAL SHOCK WAVE LITHOTRIPSY: SHX1557

## 2020-07-04 SURGERY — LITHOTRIPSY, ESWL
Anesthesia: LOCAL | Laterality: Left

## 2020-07-04 MED ORDER — CIPROFLOXACIN HCL 500 MG PO TABS
ORAL_TABLET | ORAL | Status: AC
  Filled 2020-07-04: qty 1

## 2020-07-04 MED ORDER — DIPHENHYDRAMINE HCL 25 MG PO CAPS
25.0000 mg | ORAL_CAPSULE | ORAL | Status: AC
  Administered 2020-07-04: 25 mg via ORAL

## 2020-07-04 MED ORDER — SODIUM CHLORIDE 0.9 % IV SOLN: INTRAVENOUS | Status: DC

## 2020-07-04 MED ORDER — DIAZEPAM 5 MG PO TABS
10.0000 mg | ORAL_TABLET | ORAL | Status: AC
  Administered 2020-07-04: 10 mg via ORAL

## 2020-07-04 MED ORDER — DIPHENHYDRAMINE HCL 25 MG PO CAPS
ORAL_CAPSULE | ORAL | Status: AC
  Filled 2020-07-04: qty 1

## 2020-07-04 MED ORDER — LEVOFLOXACIN 500 MG PO TABS
500.0000 mg | ORAL_TABLET | ORAL | Status: AC
  Administered 2020-07-04: 500 mg via ORAL
  Filled 2020-07-04: qty 1

## 2020-07-04 MED ORDER — TAMSULOSIN HCL 0.4 MG PO CAPS
0.4000 mg | ORAL_CAPSULE | Freq: Every day | ORAL | 1 refills | Status: AC
Start: 2020-07-04 — End: ?

## 2020-07-04 MED ORDER — DIAZEPAM 5 MG PO TABS
ORAL_TABLET | ORAL | Status: AC
  Filled 2020-07-04: qty 2

## 2020-07-04 NOTE — H&P (Signed)
See scanned H&P

## 2020-07-04 NOTE — Op Note (Signed)
See [facility] OP note scanned into chart. Also because of the size, density, location and other factors that cannot be anticipated I feel this will likely be a staged procedure. This fact supersedes any indication in the scanned [facility] operative note to the contrary.  

## 2020-07-05 ENCOUNTER — Encounter (HOSPITAL_BASED_OUTPATIENT_CLINIC_OR_DEPARTMENT_OTHER): Payer: Self-pay | Admitting: Urology

## 2020-07-29 MED ORDER — LOSARTAN POTASSIUM 50 MG PO TABS: 50.0000 mg | ORAL_TABLET | Freq: Every day | ORAL | 2 refills | Status: DC

## 2020-09-27 ENCOUNTER — Other Ambulatory Visit: Payer: Self-pay | Admitting: Family Medicine

## 2020-09-27 ENCOUNTER — Ambulatory Visit
Admission: RE | Admit: 2020-09-27 | Discharge: 2020-09-27 | Disposition: A | Discharge status: Home / Self Care | Payer: 59 | Source: Ambulatory Visit | Attending: Family Medicine | Admitting: Family Medicine

## 2020-09-27 DIAGNOSIS — R1032 Left lower quadrant pain: Secondary | ICD-10-CM

## 2020-09-29 ENCOUNTER — Other Ambulatory Visit: Payer: Self-pay | Admitting: Family Medicine

## 2020-09-29 DIAGNOSIS — R1032 Left lower quadrant pain: Secondary | ICD-10-CM

## 2020-10-03 ENCOUNTER — Other Ambulatory Visit: Payer: Self-pay | Admitting: Family Medicine

## 2020-10-03 DIAGNOSIS — R1032 Left lower quadrant pain: Secondary | ICD-10-CM

## 2020-10-15 ENCOUNTER — Inpatient Hospital Stay: Admission: RE | Admit: 2020-10-15 | Payer: 59 | Source: Ambulatory Visit

## 2020-10-20 ENCOUNTER — Encounter: Payer: Self-pay | Admitting: Internal Medicine

## 2020-11-03 ENCOUNTER — Other Ambulatory Visit: Payer: Self-pay | Admitting: Cardiology

## 2020-12-22 ENCOUNTER — Encounter: Payer: Self-pay | Admitting: Internal Medicine

## 2020-12-22 ENCOUNTER — Ambulatory Visit: Payer: 59 | Admitting: Internal Medicine

## 2020-12-22 VITALS — BP 122/78 | HR 74 | Ht 71.0 in | Wt 215.0 lb

## 2020-12-22 DIAGNOSIS — K5732 Diverticulitis of large intestine without perforation or abscess without bleeding: Secondary | ICD-10-CM | POA: Diagnosis not present

## 2020-12-22 NOTE — Patient Instructions (Signed)
Glad your doing better.  If you are age 65 or older, your body mass index should be between 23-30. Your Body mass index is 29.99 kg/m. If this is out of the aforementioned range listed, please consider follow up with your Primary Care Provider.  If you are age 31 or younger, your body mass index should be between 19-25. Your Body mass index is 29.99 kg/m. If this is out of the aformentioned range listed, please consider follow up with your Primary Care Provider.   __________________________________________________________  The [facility] providers would like to encourage you to use [facility] to communicate with providers for non-urgent requests or questions.  Due to long hold times on the telephone, sending your provider a message by [facility] may be a faster and more efficient way to get a response.  Please allow 48 business hours for a response.  Please remember that this is for non-urgent requests.   We will see you back for your colonoscopy in 2028.   I appreciate the opportunity to care for you. Silvano Rusk, MD, [facility]

## 2020-12-22 NOTE — Progress Notes (Signed)
[name] Anderson 65 y.o. 1955-09-03 [id number]  Assessment & Plan:   Encounter Diagnosis  Name Primary?   Diverticulitis of colon Yes    This has resolved and has been treated appropriately.  He had a colonoscopy in 2018 and I do not think he needs any repeat.  I will see him back as needed otherwise anticipate a routine screening/surveillance colonoscopy in 2028.  Diverticulosis and diverticulitis educational materials provided to the patient.  I explained that there is no clear evidence to support dietary restriction of any kind and that we think a high-fiber diet can be helpful.  I appreciate the opportunity to care for this patient. CC: [name] Contras, MD     Subjective:   Chief Complaint: Diverticulitis  HPI [name] Anderson is a 65 year old white man, retired Set designer, with a history of diverticulitis diagnosed by CT scan in May after he saw Dr. Rockwell Germany.  He was treated with antibiotics and this has all resolved.  He is here for follow-up of this problem and recommendations for evaluation and treatment.  Previous colonoscopy 2018 as below that was the most recent.  I have reviewed the office note from Dr. Rockwell Germany when he saw him the day in May this was diagnosed, a CBC that day was normal with a hemoglobin of 15.1 white count of 10.9 and platelets normal.  IMPRESSION: 1. Sigmoid diverticulitis with wall thickening, surrounding inflammation and scattered air bubbles throughout the peritoneal cavity consistent with micro perforation. No drainable fluid collection or bowel obstruction. Consider general surgical consultation. 2. Nonobstructing bilateral renal calculi. No evidence of ureteral calculus. 3. Hepatic steatosis. Resolution of previously demonstrated splenic lesion. 4. No evidence of metastatic disease status post right orchiectomy. 5.  Aortic Atherosclerosis (ICD10-I70.0). 6. Critical Value/emergent results were called by telephone at the time of  interpretation on 09/27/2020 at 3:51 pm to provider DAVID [facility] , who verbally acknowledged these results.     Electronically Signed   By: Richardean Sale M.D.   On: 09/27/2020 15:51 Colonoscopy 03/19/2017 - Diverticulosis in the sigmoid colon. - The examination was otherwise normal on direct and retroflexion views. - No specimens collected.  Recall 10 years he had 2 diminutive adenomas 2007 no polyps in 2013 also No Known Allergies Current Meds  Medication Sig   acetaminophen (TYLENOL) 500 MG tablet Take 1 tablet (500 mg total) by mouth every 6 (six) hours as needed (for pain.).   aluminum chloride (DRYSOL) 20 % external solution Apply 1 application topically 3 (three) times daily as needed (for sweating.).   Ascorbic Acid (VITAMIN C) 1000 MG tablet Take 1,000 mg by mouth daily.   aspirin EC 81 MG tablet Take 81 mg by mouth daily.   atorvastatin (LIPITOR) 10 MG tablet Take 1 tablet (10 mg total) by mouth daily at 6 PM.   betamethasone valerate (VALISONE) 0.1 % cream Apply 1 application topically 2 (two) times daily as needed (for skin irritation.).   cetirizine (ZYRTEC) 10 MG tablet Take 10 mg by mouth daily as needed for allergies.   ECHINACEA PO Take 760 mg by mouth daily.   fluorometholone (FML) 0.1 % ophthalmic suspension Place 1 drop into both eyes 3 (three) times daily as needed (for irritated/itchy eyes.).   fluticasone (FLONASE) 50 MCG/ACT nasal spray Place 1 spray into both nostrils at bedtime as needed for allergies or rhinitis.   loratadine (CLARITIN) 10 MG tablet Take 10 mg by mouth daily as needed for allergies.   losartan (COZAAR) 50 MG tablet  TAKE 1 TABLET BY MOUTH  DAILY   metoprolol tartrate (LOPRESSOR) 25 MG tablet TAKE ONE-HALF TABLET BY  MOUTH TWICE DAILY   Naphazoline-Pheniramine 0.027-0.315 % SOLN Place 1-2 drops into both eyes 3 (three) times daily as needed (for allergy eyes.).   NON FORMULARY Take 2 capsules by mouth 2 (two) times daily. DoTerra (Microplex VMz) 2  Capsule twice daily   NON FORMULARY Take 2 capsules by mouth 2 (two) times daily. XE0 Mega "Doterra"   oxyCODONE (OXY IR/ROXICODONE) 5 MG immediate release tablet Take 1 tablet (5 mg total) by mouth every 6 (six) hours as needed for moderate pain.   SUPER B COMPLEX/C PO Take 1 tablet by mouth daily.   tadalafil (CIALIS) 20 MG tablet Take 20 mg by mouth daily as needed for erectile dysfunction.   tamsulosin (FLOMAX) 0.4 MG CAPS capsule Take 1 capsule (0.4 mg total) by mouth daily.   triamcinolone cream (KENALOG) 0.1 % Apply 1 application topically daily as needed (FOR DRY SKIN/ITCHY SKIN.). Apply to area after bath (DO NOT APPLY TO FACE)   Past Medical History:  Diagnosis Date   Allergy    Anemia    Barrett's esophagus - short segment 03/13/2017   Cancer ([place]) 09/11/2013   testicular   Complication of anesthesia    hard to wake  - 20 years ago   Diverticulitis    ED (erectile dysfunction)    Endocarditis    /notes 03/29/2017   H/O aneurysm 03/05/2017   High cholesterol    History of basal cell carcinoma excision    02/2002   --  NOSE  &   2013  RIGHT EAR   History of kidney stones    Hyperlipidemia    Hypertension    Mild obstructive sleep apnea    PER STUDY 01-18-2005--  NO CPAP OR MOUTH GUARD   Moderate aortic valve insufficiency    Multiple pulmonary nodules    PER CT--  PROBABLE  GRANULOMATOUS - patient is not aware of this   Personal history of colonic polyps    TUBULAR ADENOMA--    Seasonal allergies    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[place])    Testicular mass    RIGHT   Past Surgical History:  Procedure Laterality Date   AORTIC VALVE REPLACEMENT N/A 06/05/2017   Procedure: AORTIC VALVE REPLACEMENT (AVR) using 9m Magna Ease valve;  Surgeon: GGrace Isaac MD;  [facility]  Service: Open [facility] Surgery;  Laterality: N/A;   COLONOSCOPY  01/24/06, 07/10/11   COLONOSCOPY WITH ESOPHAGOGASTRODUODENOSCOPY (EGD)     EXTRACORPOREAL SHOCK WAVE LITHOTRIPSY     EXTRACORPOREAL SHOCK  WAVE LITHOTRIPSY Left 07/04/2020   Procedure: EXTRACORPOREAL SHOCK WAVE LITHOTRIPSY (ESWL);  Surgeon: BLucas Mallow MD;  [facility]  Service: Urology;  Laterality: Left;   HERNIA REPAIR     MOHS SURGERY  02/2002  &  2013   TIP OF NOSE-///     RIGHT EAR   ORCHIECTOMY Right 09/11/2013   Procedure:  RIGHT ORCHIECTOMY;  Surgeon: SJorja Loa MD;  [facility]  Service: Urology;  Laterality: Right;   RIGHT/LEFT HEART CATH AND CORONARY ANGIOGRAPHY N/A 04/18/2017   Procedure: RIGHT/LEFT HEART CATH AND CORONARY ANGIOGRAPHY;  Surgeon: CSherren Mocha MD;  [facility];  Service: Cardiovascular;  Laterality: N/A;   SCROTAL EXPLORATION Right 09/11/2013   Procedure: RIGHT INGUINAL EXPLORATION;  Surgeon: SJorja Loa MD;  [facility]  Service: Urology;  Laterality: Right;   TEE WITHOUT CARDIOVERSION N/A 03/29/2017   Procedure: TRANSESOPHAGEAL ECHOCARDIOGRAM (TEE);  Surgeon: Lelon Perla, MD;  Location[facility];  Service: Cardiovascular;  Laterality: N/A;   TEE WITHOUT CARDIOVERSION N/A 06/05/2017   Procedure: TRANSESOPHAGEAL ECHOCARDIOGRAM (TEE);  Surgeon: [name], MD;  Location: [place];  Service: Open Heart Surgery;  Laterality: N/A;   TRANSTHORACIC ECHOCARDIOGRAM  02-25-2013   DR CRENSHAW   NORMAL LVSF/  EF 55-60%/   GRADE 2 DIASTOLIC DYSFUNCTION/  ECCENTRIC MODERATE AORTIC INSUFFICIENCY WITHOUT STENOSIS/  MILD DILATED AORTIC ROOT/ TRIVIAL MR, TR,  & PR   UMBILICAL HERNIA REPAIR  [id number]   UMBILICAL HERNIA REPAIR  05/22/2018   WITH MESH   UMBILICAL HERNIA REPAIR N/A 05/22/2018   Procedure: LAPAROSCOPIC REPAIR OF RECURRENT UMBILICAL HERNIA WITH MESH;  Surgeon: Donnie Mesa, MD;  Location: [place];  Service: General;  Laterality: N/A;   URETEROSCOPIC LASER LITHOTRIPSY STONE EXTRACTION  2007   Social History   Social History Narrative   Lives with wife Coralyn Mark   Retired  Set designer   Caffeine use: coffee- 1 cup per day   Occasional pipe smoker   2 sons no daughters one of his sons is a Agricultural consultant   2 caffeinated drinks daily   family history includes Colon cancer in his father; Heart disease in his paternal grandmother.   Review of Systems Kidney stone problems in March with an admission to the hospital and lithotripsy all other review of systems negative or as per HPI.  Objective:   Physical Exam BP 122/78   Pulse 74   Ht '5\' 11"'$  (1.803 m)   Wt 215 lb (97.5 kg)   SpO2 (!) 9%   BMI 29.99 kg/m  NAD Abd obese soft and nontender

## 2021-02-06 MED ORDER — LOSARTAN POTASSIUM 50 MG PO TABS: 50.0000 mg | ORAL_TABLET | Freq: Every day | ORAL | 1 refills | Status: DC

## 2021-02-06 MED ORDER — METOPROLOL TARTRATE 25 MG PO TABS: 12.5000 mg | ORAL_TABLET | Freq: Two times a day (BID) | ORAL | 1 refills | Status: DC

## 2021-02-06 NOTE — Addendum Note (Signed)
Addended by: Rexanne Mano B on: 02/06/2021 08:30 AM   Modules accepted: Orders

## 2021-02-22 ENCOUNTER — Other Ambulatory Visit: Payer: Self-pay

## 2021-02-22 ENCOUNTER — Ambulatory Visit (INDEPENDENT_AMBULATORY_CARE_PROVIDER_SITE_OTHER): Payer: Medicare Other | Admitting: Dermatology

## 2021-02-22 ENCOUNTER — Encounter: Payer: Self-pay | Admitting: Dermatology

## 2021-02-22 DIAGNOSIS — L570 Actinic keratosis: Secondary | ICD-10-CM

## 2021-02-22 DIAGNOSIS — Z1283 Encounter for screening for malignant neoplasm of skin: Secondary | ICD-10-CM | POA: Diagnosis not present

## 2021-02-22 DIAGNOSIS — Z808 Family history of malignant neoplasm of other organs or systems: Secondary | ICD-10-CM

## 2021-02-22 DIAGNOSIS — L111 Transient acantholytic dermatosis [Grover]: Secondary | ICD-10-CM | POA: Diagnosis not present

## 2021-02-22 DIAGNOSIS — L821 Other seborrheic keratosis: Secondary | ICD-10-CM | POA: Diagnosis not present

## 2021-02-22 DIAGNOSIS — Z85828 Personal history of other malignant neoplasm of skin: Secondary | ICD-10-CM

## 2021-02-22 MED ORDER — CLOBETASOL PROPIONATE 0.05 % EX FOAM: CUTANEOUS | 11 refills | Status: DC

## 2021-02-23 ENCOUNTER — Encounter: Payer: Self-pay | Admitting: Dermatology

## 2021-02-26 ENCOUNTER — Encounter: Payer: Self-pay | Admitting: Dermatology

## 2021-02-26 DIAGNOSIS — L111 Transient acantholytic dermatosis [Grover]: Secondary | ICD-10-CM

## 2021-02-27 MED ORDER — CLOBETASOL PROPIONATE 0.05 % EX FOAM: CUTANEOUS | 11 refills | Status: AC

## 2021-03-12 ENCOUNTER — Encounter: Payer: Self-pay | Admitting: Dermatology

## 2021-03-12 NOTE — Progress Notes (Signed)
   Follow-Up Visit   Subjective  [name] Anderson is a 65 y.o. male who presents for the following: Annual Exam (Patient here today for skin check, no concerns. Personal history and family history of non mole skin cancer.).  General skin examination Location:  Duration:  Quality:  Associated Signs/Symptoms: Modifying Factors:  Severity:  Timing: Context:   Objective  Well appearing patient in no apparent distress; mood and affect are within normal limits. 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 All findings within normal limits unless otherwise noted below.   Assessment & Plan    Skin exam for malignant neoplasm Torso - Posterior (Back)  Yearly skin check.   Grover's disease Torso - Posterior (Back)   May use topical clobetasol daily after bathing when the rash is itchy.  Avoid use on face and body folds.   Related Medications clobetasol (OLUX) 0.05 % topical foam Apply to affected area after bathing  Seborrheic keratosis Torso - Posterior (Back)  Benign okay to leave if stable  AK (actinic keratosis) Right Anterior Lobule  Destruction of lesion - Right Anterior Lobule Complexity: simple   Destruction method: cryotherapy   Informed consent: discussed and consent obtained   Lesion destroyed using liquid nitrogen: Yes   Cryotherapy cycles:  3 Outcome: patient tolerated procedure well with no complications        I,  Lavonna Monarch, MD, have reviewed all documentation for this visit.  The documentation on 03/12/21 for the exam, diagnosis, procedures, and orders are all accurate and complete.

## 2021-05-03 ENCOUNTER — Other Ambulatory Visit: Payer: Self-pay | Admitting: *Deleted

## 2021-05-03 DIAGNOSIS — R911 Solitary pulmonary nodule: Secondary | ICD-10-CM

## 2021-05-03 DIAGNOSIS — I7121 Aneurysm of the ascending aorta, without rupture: Secondary | ICD-10-CM

## 2021-05-08 NOTE — Progress Notes (Signed)
HPI:FU AVR. Echocardiogram 11/18 showed Ejection fraction 50-55%, severe left ventricular enlargement, grade 1 diastolic dysfunction, severe aortic insufficiency, dilated aortic root at 46 mm, mild mitral regurgitation and mild left atrial enlargement. CTA of thoracic aorta repeated November 2018; the aortic root measured 4.7 cm. Transesophageal echocardiogram November 2018 showed normal LV function, aortic valve vegetation on left coronary cusp, severe aortic insufficiency, mildly dilated aortic root, redundant mitral valve chordae and associated vegetation could not be excluded, mild mitral regurgitation. Blood cultures grew coag negative staph. Patient placed on antibiotics with plans for aortic valve/aortic root replacement after completing 6 weeks. Cardiac catheterization 12/20 showed normal coronary arteries and normal right and left heart pressures. Preoperative carotid Dopplers January 2019 showed no stenosis. Patient underwent aortic valve replacement (bioprosthetic AVR) on June 05, 2017.  The aortic root was not replaced as it measured 4.1 cm at time of surgery. Patient did have postoperative atrial fibrillation treated with amiodarone. Echocardiogram April 2019 showed ejection fraction 50 to 27%, grade 1 diastolic dysfunction, bioprosthetic aortic valve with no aortic insufficiency and mean gradient 13 mmHg, ascending aorta 39 mm, mild mitral regurgitation.  Chest CTA August 2021 showed stable changes of aortic valve replacement; aortic root 4.5 cm.  Since last seen, patient denies dyspnea, chest pain, palpitations or syncope.  Current Outpatient Medications  Medication Sig Dispense Refill   acetaminophen (TYLENOL) 500 MG tablet Take 1 tablet (500 mg total) by mouth every 6 (six) hours as needed (for pain.). 30 tablet 0   Ascorbic Acid (VITAMIN C) 1000 MG tablet Take 1,000 mg by mouth daily.     aspirin EC 81 MG tablet Take 81 mg by mouth daily.     atorvastatin (LIPITOR) 10 MG tablet  Take 1 tablet (10 mg total) by mouth daily at 6 PM. 30 tablet 6   cetirizine (ZYRTEC) 10 MG tablet Take 10 mg by mouth daily as needed for allergies.     clobetasol (OLUX) 0.05 % topical foam Apply to affected area after bathing 50 g 11   loratadine (CLARITIN) 10 MG tablet Take 10 mg by mouth daily as needed for allergies.     losartan (COZAAR) 50 MG tablet Take 1 tablet (50 mg total) by mouth daily. 90 tablet 1   metoprolol tartrate (LOPRESSOR) 25 MG tablet Take 0.5 tablets (12.5 mg total) by mouth 2 (two) times daily. 90 tablet 1   Naphazoline-Pheniramine 0.027-0.315 % SOLN Place 1-2 drops into both eyes 3 (three) times daily as needed (for allergy eyes.).     NON FORMULARY Take 2 capsules by mouth 2 (two) times daily. XE0 Mega "Doterra"     triamcinolone cream (KENALOG) 0.1 % Apply 1 application topically daily as needed (FOR DRY SKIN/ITCHY SKIN.). Apply to area after bath (DO NOT APPLY TO FACE)  1   aluminum chloride (DRYSOL) 20 % external solution Apply 1 application topically 3 (three) times daily as needed (for sweating.). (Patient not taking: Reported on 05/18/2021)     amoxicillin (AMOXIL) 500 MG tablet For dental procedures (Patient not taking: Reported on 05/18/2021)     betamethasone valerate (VALISONE) 0.1 % cream Apply 1 application topically 2 (two) times daily as needed (for skin irritation.). (Patient not taking: Reported on 05/18/2021)     ECHINACEA PO Take 760 mg by mouth daily. (Patient not taking: Reported on 05/18/2021)     fluorometholone (FML) 0.1 % ophthalmic suspension Place 1 drop into both eyes 3 (three) times daily as needed (for irritated/itchy eyes.). (  Patient not taking: Reported on 05/18/2021)     fluticasone (FLONASE) 50 MCG/ACT nasal spray Place 1 spray into both nostrils at bedtime as needed for allergies or rhinitis. (Patient not taking: Reported on 05/18/2021)     NON FORMULARY Take 2 capsules by mouth 2 (two) times daily. DoTerra (Microplex VMz) 2 Capsule twice daily  (Patient not taking: Reported on 05/18/2021)     oxyCODONE (OXY IR/ROXICODONE) 5 MG immediate release tablet Take 1 tablet (5 mg total) by mouth every 6 (six) hours as needed for moderate pain. (Patient not taking: Reported on 05/18/2021) 24 tablet 0   SUPER B COMPLEX/C PO Take 1 tablet by mouth daily. (Patient not taking: Reported on 05/18/2021)     tadalafil (CIALIS) 20 MG tablet Take 20 mg by mouth daily as needed for erectile dysfunction. (Patient not taking: Reported on 05/18/2021)     tamsulosin (FLOMAX) 0.4 MG CAPS capsule Take 1 capsule (0.4 mg total) by mouth daily. (Patient not taking: Reported on 05/18/2021) 14 capsule 1   No current facility-administered medications for this visit.     Past Medical History:  Diagnosis Date   Allergy    Anemia    Barrett's esophagus - short segment 03/13/2017   Basal cell carcinoma 02/17/1998   Left Post Auriular (tx p bx)   BCC (basal cell carcinoma of skin) 01/13/2002   Infra Tip Nose (MOH's)   BCC (basal cell carcinoma of skin) 03/28/2006   Right Neck (curet and excision)   BCC (basal cell carcinoma of skin) 03/28/2006   Right Ear Rim (curet, excision, 5FU)   BCC (basal cell carcinoma of skin) 04/28/2012   Right Ear Rim (MOH's)   BCC (basal cell carcinoma of skin) 09/09/2012   Right Eyebrow (curet and 5FU)   BCC (basal cell carcinoma of skin) 09/09/2012   Right Back Sup (tx p bx)   BCC (basal cell carcinoma of skin) 09/09/2012   Right Back Inf (tx p bx)   Cancer ([place]) 09/11/2013   testicular   Complication of anesthesia    hard to wake  - 20 years ago   Diverticulitis    ED (erectile dysfunction)    Endocarditis    /notes 03/29/2017   H/O aneurysm 03/05/2017   High cholesterol    History of basal cell carcinoma excision    02/2002   --  NOSE  &   2013  RIGHT EAR   History of kidney stones    Hyperlipidemia    Hypertension    Mild obstructive sleep apnea    PER STUDY 01-18-2005--  NO CPAP OR MOUTH GUARD   Moderate aortic valve  insufficiency    Multiple pulmonary nodules    PER CT--  PROBABLE  GRANULOMATOUS - patient is not aware of this   Personal history of colonic polyps    TUBULAR ADENOMA--    Seasonal allergies    Superficial basal cell carcinoma (BCC) 03/24/2014   Left Lower Back   Superficial basal cell carcinoma (BCC) 01/27/2018   Above Left Eyebrow (curet and 5FU)   Superficial basal cell carcinoma (BCC) 01/27/2018   Right Upper Abdomen (curet and 5FU)   Superficial nodular basal cell carcinoma (BCC) 01/27/2018   Left Scapula (curet and 5FU)   Superficial nodular basal cell carcinoma (BCC) 01/27/2018   Right Upper Thigh (curet and 5FU)   Superficial nodular infiltrative basal cell carcinoma (BCC) 01/27/2018   Right Lower Back Mid (curet and 5FU)   Testicular cancer [facility])    Testicular  mass    RIGHT    Past Surgical History:  Procedure Laterality Date   AORTIC VALVE REPLACEMENT N/A 06/05/2017   Procedure: AORTIC VALVE REPLACEMENT (AVR) using 47mm Magna Ease valve;  Surgeon: [name], MD;  Location: [place];  Service: Open Heart Surgery;  Laterality: N/A;   COLONOSCOPY  01/24/06, 07/10/11   COLONOSCOPY WITH ESOPHAGOGASTRODUODENOSCOPY (EGD)     EXTRACORPOREAL SHOCK WAVE LITHOTRIPSY     EXTRACORPOREAL SHOCK WAVE LITHOTRIPSY Left 07/04/2020   Procedure: EXTRACORPOREAL SHOCK WAVE LITHOTRIPSY (ESWL);  Surgeon: Lucas Mallow, MD;  Location[facility];  Service: Urology;  Laterality: Left;   HERNIA REPAIR     MOHS SURGERY  02/2002  &  2013   TIP OF NOSE-///     RIGHT EAR   ORCHIECTOMY Right 09/11/2013   Procedure:  RIGHT ORCHIECTOMY;  Surgeon: Jorja Loa, MD;  [facility];  Service: Urology;  Laterality: Right;   RIGHT/LEFT HEART CATH AND CORONARY ANGIOGRAPHY N/A 04/18/2017   Procedure: RIGHT/LEFT HEART CATH AND CORONARY ANGIOGRAPHY;  Surgeon: Sherren Mocha, MD;  Location: [place] [facility];  Service: Cardiovascular;  Laterality: N/A;    SCROTAL EXPLORATION Right 09/11/2013   Procedure: RIGHT INGUINAL EXPLORATION;  Surgeon: Jorja Loa, MD;  Location: [facility];  Service: Urology;  Laterality: Right;   TEE WITHOUT CARDIOVERSION N/A 03/29/2017   Procedure: TRANSESOPHAGEAL ECHOCARDIOGRAM (TEE);  Surgeon: Lelon Perla, MD;  Location: [facility]COPY;  Service: Cardiovascular;  Laterality: N/A;   TEE WITHOUT CARDIOVERSION N/A 06/05/2017   Procedure: TRANSESOPHAGEAL ECHOCARDIOGRAM (TEE);  Surgeon: [name], MD;  Location: [place];  Service: Open Heart Surgery;  Laterality: N/A;   TRANSTHORACIC ECHOCARDIOGRAM  02-25-2013   DR Ceasia Elwell   NORMAL LVSF/  EF 55-60%/   GRADE 2 DIASTOLIC DYSFUNCTION/  ECCENTRIC MODERATE AORTIC INSUFFICIENCY WITHOUT STENOSIS/  MILD DILATED AORTIC ROOT/ TRIVIAL MR, TR,  & PR   UMBILICAL HERNIA REPAIR  [id number]   UMBILICAL HERNIA REPAIR  05/22/2018   WITH MESH   UMBILICAL HERNIA REPAIR N/A 05/22/2018   Procedure: LAPAROSCOPIC REPAIR OF RECURRENT UMBILICAL HERNIA WITH MESH;  Surgeon: Donnie Mesa, MD;  Location: [place];  Service: General;  Laterality: N/A;   URETEROSCOPIC LASER LITHOTRIPSY STONE EXTRACTION  2007    Social History   Socioeconomic History   Marital status: Married    Spouse name: Coralyn Mark   Number of children: 2   Years of education: 12   Highest education level: Not on file  Occupational History   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  Tobacco Use   Smoking status: Some Days    Types: Pipe   Smokeless tobacco: Former    Types: Chew    Quit date: 04/26/2011   Tobacco comments:    OCCASIONAL  PIPE SMOKER  ([place])  Vaping Use   Vaping Use: Never used  Substance and Sexual Activity   Alcohol use: Yes    Comment: RARE   Drug use: No   Sexual activity: Yes  Other Topics Concern   Not on file  Social History Narrative   Lives with wife Coralyn Mark   Retired Set designer   Caffeine use: coffee- 1 cup per day   Occasional pipe smoker    2 sons no daughters one of his sons is a Agricultural consultant   2 caffeinated drinks daily   Social Determinants of Radio broadcast assistant Strain: Not on file  Food Insecurity: Not on file  Transportation Needs: Not on file  Physical Activity: Not on file  Stress: Not on file  Social Connections: Not on file  Intimate Partner Violence: Not on file    Family History  Problem Relation Age of Onset   Colon cancer Father    Heart disease Paternal Grandmother    Stomach cancer Neg Hx    Esophageal cancer Neg Hx    Pancreatic cancer Neg Hx     ROS: no fevers or chills, productive cough, hemoptysis, dysphasia, odynophagia, melena, hematochezia, dysuria, hematuria, rash, seizure activity, orthopnea, PND, pedal edema, claudication. Remaining systems are negative.  Physical Exam: Well-developed well-nourished in no acute distress.  Skin is warm and dry.  HEENT is normal.  Neck is supple.  Chest is clear to auscultation with normal expansion.  Cardiovascular exam is regular rate and rhythm.  Abdominal exam nontender or distended. No masses palpated. Extremities show no edema. neuro grossly intact  ECG-normal sinus rhythm at a rate of 71, cannot rule out prior inferior infarct.  Personally reviewed  A/P  1 status post aortic valve replacement-plan to continue SBE prophylaxis.  2 dilated aortic root-he is scheduled to see Dr. Cyndia Bent later this month and follow-up CT has also been arranged.  3 hypertension-blood pressure controlled.  Continue present medical regimen.  Potassium and renal function monitored by primary care.  4 hyperlipidemia-continue statin.  Lipids and liver monitored by primary care.  Kirk Ruths, MD

## 2021-05-18 ENCOUNTER — Other Ambulatory Visit: Payer: Self-pay

## 2021-05-18 ENCOUNTER — Encounter: Payer: Self-pay | Admitting: Cardiology

## 2021-05-18 ENCOUNTER — Ambulatory Visit: Payer: Medicare Other | Admitting: Cardiology

## 2021-05-18 VITALS — BP 114/82 | HR 71 | Ht 71.0 in | Wt 226.8 lb

## 2021-05-18 DIAGNOSIS — I77810 Thoracic aortic ectasia: Secondary | ICD-10-CM | POA: Diagnosis not present

## 2021-05-18 DIAGNOSIS — Z952 Presence of prosthetic heart valve: Secondary | ICD-10-CM | POA: Diagnosis not present

## 2021-05-18 DIAGNOSIS — I10 Essential (primary) hypertension: Secondary | ICD-10-CM

## 2021-05-18 DIAGNOSIS — E7800 Pure hypercholesterolemia, unspecified: Secondary | ICD-10-CM

## 2021-05-18 NOTE — Patient Instructions (Signed)

## 2021-06-21 ENCOUNTER — Encounter: Payer: Self-pay | Admitting: Surgery

## 2021-06-21 ENCOUNTER — Ambulatory Visit
Admission: RE | Admit: 2021-06-21 | Discharge: 2021-06-21 | Disposition: A | Discharge status: Home / Self Care | Payer: Medicare Other | Source: Ambulatory Visit | Attending: Surgery | Admitting: Surgery

## 2021-06-21 ENCOUNTER — Other Ambulatory Visit: Payer: Self-pay

## 2021-06-21 ENCOUNTER — Ambulatory Visit (INDEPENDENT_AMBULATORY_CARE_PROVIDER_SITE_OTHER): Payer: Medicare Other | Admitting: Surgery

## 2021-06-21 VITALS — BP 114/77 | HR 76 | Resp 20 | Ht 71.0 in | Wt 224.0 lb

## 2021-06-21 DIAGNOSIS — I7121 Aneurysm of the ascending aorta, without rupture: Secondary | ICD-10-CM | POA: Diagnosis not present

## 2021-06-21 DIAGNOSIS — R911 Solitary pulmonary nodule: Secondary | ICD-10-CM

## 2021-06-21 MED ORDER — IOPAMIDOL (ISOVUE-370) INJECTION 76%
75.0000 mL | Freq: Once | INTRAVENOUS | Status: AC | PRN
  Administered 2021-06-21: 75 mL via INTRAVENOUS

## 2021-06-21 NOTE — Progress Notes (Signed)
HPI:  The patient is a 66 year old gentleman who underwent aortic valve replacement using a 25 mmEdwards 3300 pericardial bioprosthetic valve by Dr. Servando Snare on 06/05/2017 for severe aortic insufficiency with a history of endocarditis.  He was last seen by Dr. Servando Snare on 12/17/2019 and recently saw Dr. Stanford Breed in follow-up.  He has been feeling well without chest or back pain and no shortness of breath.  Current Outpatient Medications  Medication Sig Dispense Refill   acetaminophen (TYLENOL) 500 MG tablet Take 1 tablet (500 mg total) by mouth every 6 (six) hours as needed (for pain.). 30 tablet 0   Ascorbic Acid (VITAMIN C) 1000 MG tablet Take 1,000 mg by mouth daily.     aspirin EC 81 MG tablet Take 81 mg by mouth daily.     atorvastatin (LIPITOR) 10 MG tablet Take 1 tablet (10 mg total) by mouth daily at 6 PM. 30 tablet 6   cetirizine (ZYRTEC) 10 MG tablet Take 10 mg by mouth daily as needed for allergies.     clobetasol (OLUX) 0.05 % topical foam Apply to affected area after bathing 50 g 11   loratadine (CLARITIN) 10 MG tablet Take 10 mg by mouth daily as needed for allergies.     losartan (COZAAR) 50 MG tablet Take 1 tablet (50 mg total) by mouth daily. 90 tablet 1   metoprolol tartrate (LOPRESSOR) 25 MG tablet Take 0.5 tablets (12.5 mg total) by mouth 2 (two) times daily. 90 tablet 1   NON FORMULARY Take 2 capsules by mouth 2 (two) times daily. XE0 Mega "Doterra"     triamcinolone cream (KENALOG) 0.1 % Apply 1 application topically daily as needed (FOR DRY SKIN/ITCHY SKIN.). Apply to area after bath (DO NOT APPLY TO FACE)  1   aluminum chloride (DRYSOL) 20 % external solution Apply 1 application topically 3 (three) times daily as needed (for sweating.). (Patient not taking: Reported on 05/18/2021)     amoxicillin (AMOXIL) 500 MG tablet For dental procedures (Patient not taking: Reported on 05/18/2021)     betamethasone valerate (VALISONE) 0.1 % cream Apply 1 application topically 2 (two)  times daily as needed (for skin irritation.). (Patient not taking: Reported on 05/18/2021)     ECHINACEA PO Take 760 mg by mouth daily. (Patient not taking: Reported on 05/18/2021)     fluorometholone (FML) 0.1 % ophthalmic suspension Place 1 drop into both eyes 3 (three) times daily as needed (for irritated/itchy eyes.). (Patient not taking: Reported on 05/18/2021)     fluticasone (FLONASE) 50 MCG/ACT nasal spray Place 1 spray into both nostrils at bedtime as needed for allergies or rhinitis. (Patient not taking: Reported on 05/18/2021)     Naphazoline-Pheniramine 0.027-0.315 % SOLN Place 1-2 drops into both eyes 3 (three) times daily as needed (for allergy eyes.). (Patient not taking: Reported on 06/21/2021)     NON FORMULARY Take 2 capsules by mouth 2 (two) times daily. DoTerra (Microplex VMz) 2 Capsule twice daily (Patient not taking: Reported on 05/18/2021)     oxyCODONE (OXY IR/ROXICODONE) 5 MG immediate release tablet Take 1 tablet (5 mg total) by mouth every 6 (six) hours as needed for moderate pain. (Patient not taking: Reported on 05/18/2021) 24 tablet 0   SUPER B COMPLEX/C PO Take 1 tablet by mouth daily. (Patient not taking: Reported on 05/18/2021)     tadalafil (CIALIS) 20 MG tablet Take 20 mg by mouth daily as needed for erectile dysfunction. (Patient not taking: Reported on 05/18/2021)  tamsulosin (FLOMAX) 0.4 MG CAPS capsule Take 1 capsule (0.4 mg total) by mouth daily. (Patient not taking: Reported on 05/18/2021) 14 capsule 1   No current facility-administered medications for this visit.     Physical Exam: BP 114/77    Pulse 76    Resp 20    Ht 5\' 11"  (1.803 m)    Wt 224 lb (101.6 kg)    SpO2 96% Comment: RA   BMI 31.24 kg/m  He looks well. Cardiac exam shows a regular rate and rhythm with normal heart sounds.  There is no murmur. Lungs are clear. There is no peripheral edema.   Diagnostic Tests:  Narrative & Impression  CLINICAL DATA:  History of aortic valve replacement.  Evaluate ascending aorta. Follow-up pulmonary nodule.   EXAM: CT ANGIOGRAPHY CHEST WITH CONTRAST   TECHNIQUE: Multidetector CT imaging of the chest was performed using the standard protocol during bolus administration of intravenous contrast. Multiplanar CT image reconstructions and MIPs were obtained to evaluate the vascular anatomy.   RADIATION DOSE REDUCTION: This exam was performed according to the departmental dose-optimization program which includes automated exposure control, adjustment of the mA and/or kV according to patient size and/or use of iterative reconstruction technique.   CONTRAST:  2mL ISOVUE-370 IOPAMIDOL (ISOVUE-370) INJECTION 76%   COMPARISON:  12/17/2019 and 06/05/2018   FINDINGS: Cardiovascular: Aortic root at the sinuses of Valsalva measure 4.3 cm and previously measured 4.5 cm. There is a prosthetic aortic valve. Sinotubular junction measures 2.9 cm. Mid ascending thoracic aorta measures 3.5 cm and stable. Proximal aortic arch measures 3.2 cm and stable. Postsurgical changes along the anterior aspect of the ascending thoracic aorta. Great vessels are widely patent with typical three-vessel arch anatomy. Bilateral vertebral arteries are patent, left side is dominant. Proximal descending thoracic aorta measures 3.0 cm and stable. Distal descending thoracic aorta measures 2.2 cm and stable. Negative for an aortic dissection. Celiac trunk and main branch vessels are widely patent. Incidentally, there is a replaced right hepatic artery coming off the SMA. Main right renal artery is widely patent. Three left renal arteries are visualized.   Mediastinum/Nodes: No mediastinal, hilar or axillary lymph node enlargement. Esophagus is unremarkable.   Lungs/Pleura: Trachea and mainstem bronchi are patent. No pleural effusions. 5 mm nodule in the right lower lobe on sequence 7, image 99 is stable since 2020. Stable 3 mm nodule at the left lung apex on sequence  7, image 26. Stable focal thickening along the left major fissure on image 71. Stable 3 mm nodule along the left major fissure on sequence 7 image 77. Additional tiny nodules in left lung are stable. No airspace disease or lung consolidation.   Upper Abdomen: Small bilateral renal calculi. The entire kidneys are not imaged. Small cyst in the right kidney upper pole. Diffuse low-density in the liver.   Musculoskeletal: No acute bone abnormality.   Review of the MIP images confirms the above findings.   IMPRESSION: 1. Stable chest CTA. 2. 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 Normal caliber of the ascending and descending thoracic aorta. Postoperative changes from aortic valve replacement. 3. No acute chest abnormality. 4. Stable small pulmonary nodules. Minimal change since 2020 and these are compatible with small benign pulmonary nodules. 5. Hepatic steatosis. 6. Bilateral renal calculi.     Electronically Signed   By: Markus Daft M.D.   On: 06/21/2021 15:38    Impression:  This 66 year old gentleman is status post aortic  valve replacement for severe aortic insufficiency in 2019 and was noted to have mild aortic root dilatation at that time.  His current CTA of the chest shows the aortic root to have a diameter of 4.3 cm at the level of the sinuses of Valsalva.  His previous CT showed this dimension to be 4.5 cm.  The sinotubular junction is 2.9 cm in the mid ascending aorta is 3.5 cm.  This is relatively mild dilatation of the aortic root and appears stable.  I certainly would not recommend surgical treatment unless it progressively enlarged to about 5.5 cm.  I reviewed the CTA images with the patient and answered his questions.  Since this appears isolated to the sinus portion of the aortic root I think it is reasonable to continue following this with periodic echocardiograms that should be done to follow-up on his prosthetic aortic valve.   That way we will get both pieces of information out of 1 study and avoid radiation therapy since his ascending aorta is essentially normal.  The patient is in full agreement with that.  I stressed the importance of continued good blood pressure control in preventing further enlargement and acute aortic dissection.  I also advised him against doing any heavy lifting that may require a Valsalva maneuver and could suddenly raise his blood pressure to high levels.  Plan:  He will continue to follow-up with Dr. Stanford Breed and will need periodic echocardiograms to follow his aortic valve function and aortic root dimensions.  I will be happy to see him back if there is any significant enlargement of his aortic root or significant valvular dysfunction.  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    Gaye Pollack, MD [facility] [phone]

## 2022-01-19 ENCOUNTER — Other Ambulatory Visit: Payer: Self-pay | Admitting: Cardiology

## 2022-02-19 ENCOUNTER — Other Ambulatory Visit: Payer: Self-pay | Admitting: Cardiology

## 2022-02-26 ENCOUNTER — Ambulatory Visit: Payer: Medicare Other | Admitting: Dermatology

## 2022-03-26 ENCOUNTER — Encounter: Payer: Self-pay | Admitting: Cardiology

## 2022-03-29 ENCOUNTER — Encounter: Payer: Self-pay | Admitting: Internal Medicine

## 2022-05-10 ENCOUNTER — Encounter: Payer: Self-pay | Admitting: Cardiology

## 2022-06-13 NOTE — Progress Notes (Unsigned)
Cardiology Clinic Note   Patient Name: [name] Anderson Date of Encounter: 06/14/2022  Primary Care Provider:  Antony Contras, MD Primary Cardiologist:  Kirk Ruths, MD  Patient Profile    [name] Anderson 67 year old male presents to the clinic today for follow-up evaluation of his aortic root dilation, aortic insufficiency (status post AVR 2/19), and hypercholesterolemia.  Past Medical History    Past Medical History:  Diagnosis Date   Allergy    Anemia    Barrett's esophagus - short segment 03/13/2017   Basal cell carcinoma 02/17/1998   Left Post Auriular (tx p bx)   BCC (basal cell carcinoma of skin) 01/13/2002   Infra Tip Nose (MOH's)   BCC (basal cell carcinoma of skin) 03/28/2006   Right Neck (curet and excision)   BCC (basal cell carcinoma of skin) 03/28/2006   Right Ear Rim (curet, excision, 5FU)   BCC (basal cell carcinoma of skin) 04/28/2012   Right Ear Rim (MOH's)   BCC (basal cell carcinoma of skin) 09/09/2012   Right Eyebrow (curet and 5FU)   BCC (basal cell carcinoma of skin) 09/09/2012   Right Back Sup (tx p bx)   BCC (basal cell carcinoma of skin) 09/09/2012   Right Back Inf (tx p bx)   Cancer ([place]) 09/11/2013   testicular   Complication of anesthesia    hard to wake  - 20 years ago   Diverticulitis    ED (erectile dysfunction)    Endocarditis    /notes 03/29/2017   H/O aneurysm 03/05/2017   High cholesterol    History of basal cell carcinoma excision    02/2002   --  NOSE  &   2013  RIGHT EAR   History of kidney stones    Hyperlipidemia    Hypertension    Mild obstructive sleep apnea    PER STUDY 01-18-2005--  NO CPAP OR MOUTH GUARD   Moderate aortic valve insufficiency    Multiple pulmonary nodules    PER CT--  PROBABLE  GRANULOMATOUS - patient is not aware of this   Personal history of colonic polyps    TUBULAR ADENOMA--    Seasonal allergies    Superficial basal cell carcinoma (BCC) 03/24/2014   Left Lower Back   Superficial basal cell  carcinoma (BCC) 01/27/2018   Above Left Eyebrow (curet and 5FU)   Superficial basal cell carcinoma (BCC) 01/27/2018   Right Upper Abdomen (curet and 5FU)   Superficial nodular basal cell carcinoma (BCC) 01/27/2018   Left Scapula (curet and 5FU)   Superficial nodular basal cell carcinoma (BCC) 01/27/2018   Right Upper Thigh (curet and 5FU)   Superficial nodular infiltrative basal cell carcinoma (BCC) 01/27/2018   Right Lower Back Mid (curet and 5FU)   Testicular cancer [name])    Testicular mass    RIGHT   Past Surgical History:  Procedure Laterality Date   AORTIC VALVE REPLACEMENT N/A 06/05/2017   Procedure: AORTIC VALVE REPLACEMENT (AVR) using 42m Magna Ease valve;  Surgeon: GGrace Isaac MD;  [facility]  Service: Open Heart Surgery;  Laterality: N/A;   COLONOSCOPY  01/24/06, 07/10/11   COLONOSCOPY WITH ESOPHAGOGASTRODUODENOSCOPY (EGD)     EXTRACORPOREAL SHOCK WAVE LITHOTRIPSY     EXTRACORPOREAL SHOCK WAVE LITHOTRIPSY Left 07/04/2020   Procedure: EXTRACORPOREAL SHOCK WAVE LITHOTRIPSY (ESWL);  Surgeon: BLucas Mallow MD;  [facility]  Service: Urology;  Laterality: Left;   HERNIA REPAIR     MOHS SURGERY  02/2002  &  2013   TIP OF NOSE-///     RIGHT EAR   ORCHIECTOMY Right 09/11/2013   Procedure:  RIGHT ORCHIECTOMY;  Surgeon: Jorja Loa, MD;  Location: [facility];  Service: Urology;  Laterality: Right;   RIGHT/LEFT HEART CATH AND CORONARY ANGIOGRAPHY N/A 04/18/2017   Procedure: RIGHT/LEFT HEART CATH AND CORONARY ANGIOGRAPHY;  Surgeon: Sherren Mocha, MD;  Location: [place] CV LAB;  Service: Cardiovascular;  Laterality: N/A;   SCROTAL EXPLORATION Right 09/11/2013   Procedure: RIGHT INGUINAL EXPLORATION;  Surgeon: Jorja Loa, MD;  [facility];  Service: Urology;  Laterality: Right;   TEE WITHOUT CARDIOVERSION N/A 03/29/2017   Procedure: TRANSESOPHAGEAL ECHOCARDIOGRAM (TEE);  Surgeon:  Lelon Perla, MD;  Location: [facility] ENDOSCOPY;  Service: Cardiovascular;  Laterality: N/A;   TEE WITHOUT CARDIOVERSION N/A 06/05/2017   Procedure: TRANSESOPHAGEAL ECHOCARDIOGRAM (TEE);  Surgeon: [name], MD;  Location: [place];  Service: Open Heart Surgery;  Laterality: N/A;   TRANSTHORACIC ECHOCARDIOGRAM  02-25-2013   DR CRENSHAW   NORMAL LVSF/  EF 55-60%/   GRADE 2 DIASTOLIC DYSFUNCTION/  ECCENTRIC MODERATE AORTIC INSUFFICIENCY WITHOUT STENOSIS/  MILD DILATED AORTIC ROOT/ TRIVIAL MR, TR,  & PR   UMBILICAL HERNIA REPAIR  [id number]   UMBILICAL HERNIA REPAIR  05/22/2018   WITH MESH   UMBILICAL HERNIA REPAIR N/A 05/22/2018   Procedure: LAPAROSCOPIC REPAIR OF RECURRENT UMBILICAL HERNIA WITH MESH;  Surgeon: Donnie Mesa, MD;  Location: [place];  Service: General;  Laterality: N/A;   URETEROSCOPIC LASER LITHOTRIPSY STONE EXTRACTION  2007    Allergies  Allergies  Allergen Reactions   Atorvastatin Other (See Comments)    History of Present Illness    [name] Anderson has a PMH of aortic root dilation status post aortic valve replacement, HTN, HLD, umbilical hernia, fatigue, and diverticulosis.  Echocardiogram 11/18 showed an EF of 50-55%, severe left ventricular enlargement, G1 DD, severe aortic insufficiency, dilated aortic root measuring 46 mm, mild mitral regurgitation and mild left atrial enlargement.  CTA of his thoracic aorta 11/18 showed an aortic root measuring 4.7 cm.  TEE 11/18 showed normal LV function, aortic valve vegetation on the left coronary cusp, severe aortic insufficiency, mildly dilated aortic root, redundant mitral chordae and associated vegetation cannot be excluded.  Blood cultures were negative for staph.  He was placed on antibiotics with plans to undergo aortic valve and aortic root replacement.  Cardiac catheterization 12/20 showed normal coronary arteries and normal right and left heart pressures.  His preoperative cardiac Dopplers 1/19 showed no stenosis.  He  underwent aortic valve replacement with bioprosthetic AVR 2/6/ 19.  His aortic root was not replaced.  It measured 4.1 cm at the time of surgery.  He was noted to have postoperative A-fib which was treated with amiodarone.  His echocardiogram 4/19 showed an EF of 50-55%, G1 DD, bioprosthetic aortic valve with no AI and a mean gradient of 13 mmHg.  His ascending aorta measured 39 mm and he was noted to have mild mitral valve regurgitation.  His chest CTA 8/21 showed stable changes of aortic valve replacement and aortic root of 4.5 cm.  He was seen in follow-up by Dr. Stanford Breed on 05/18/2021.  During that time he denied dyspnea, chest pain, palpitations and syncope.  He presents to the clinic today for follow-up evaluation and states he feels well.  He does note some shortness of breath with increased physical activity but does not note any shortness  of breath at rest.  He continues to be physically active walking 2 miles 4 to 5 days/week.  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  They enjoy horseback riding and white water rafting.  We reviewed his most recent lab work.  His EKG today shows normal sinus rhythm 85 bpm.  His blood pressure is well-controlled at 118/80.  He continues to follow-up with Dr. Cyndia Bent for his aortic valve.  Today he denies chest pain, shortness of breath, lower extremity edema, fatigue, palpitations, melena, hematuria, hemoptysis, diaphoresis, weakness, presyncope, syncope, orthopnea, and PND.    Home Medications    Prior to Admission medications   Medication Sig Start Date End Date Taking? Authorizing Provider  acetaminophen (TYLENOL) 500 MG tablet Take 1 tablet (500 mg total) by mouth every 6 (six) hours as needed (for pain.). 06/09/17   Nani Skillern, PA-C  aluminum chloride (DRYSOL) 20 % external solution Apply 1 application topically 3 (three) times daily as needed (for sweating.). Patient not taking: Reported on 05/18/2021     [provider]  amoxicillin (AMOXIL) 500 MG tablet For dental procedures Patient not taking: Reported on 05/18/2021 12/18/19   [provider]  Ascorbic Acid (VITAMIN C) 1000 MG tablet Take 1,000 mg by mouth daily.    [provider]  aspirin EC 81 MG tablet Take 81 mg by mouth daily.    [provider]  atorvastatin (LIPITOR) 10 MG tablet Take 1 tablet (10 mg total) by mouth daily at 6 PM. 04/03/17   Duke, Tami Lin, PA  betamethasone valerate (VALISONE) 0.1 % cream Apply 1 application topically 2 (two) times daily as needed (for skin irritation.). Patient not taking: Reported on 05/18/2021    [provider]  cetirizine (ZYRTEC) 10 MG tablet Take 10 mg by mouth daily as needed for allergies.    [provider]  clobetasol (OLUX) 0.05 % topical foam Apply to affected area after bathing 02/27/21   Lavonna Monarch, MD  ECHINACEA PO Take 760 mg by mouth daily. Patient not taking: Reported on 05/18/2021    [provider]  fluorometholone (FML) 0.1 % ophthalmic suspension Place 1 drop into both eyes 3 (three) times daily as needed (for irritated/itchy eyes.). Patient not taking: Reported on 05/18/2021    [provider]  fluticasone (FLONASE) 50 MCG/ACT nasal spray Place 1 spray into both nostrils at bedtime as needed for allergies or rhinitis. Patient not taking: Reported on 05/18/2021    [provider]  loratadine (CLARITIN) 10 MG tablet Take 10 mg by mouth daily as needed for allergies.    [provider]  losartan (COZAAR) 50 MG tablet TAKE 1 TABLET DAILY 02/20/22   Lelon Perla, MD  metoprolol tartrate (LOPRESSOR) 25 MG tablet TAKE 1/2 TABLET (12.5MG    TOTAL) TWO TIMES A DAY 01/19/22   Lelon Perla, MD  Naphazoline-Pheniramine 0.027-0.315 % SOLN Place 1-2 drops into both eyes 3 (three) times daily as needed (for allergy eyes.). Patient not taking: Reported on 06/21/2021    [provider]   NON FORMULARY Take 2 capsules by mouth 2 (two) times daily. DoTerra (Microplex VMz) 2 Capsule twice daily Patient not taking: Reported on 05/18/2021    [provider]  NON FORMULARY Take 2 capsules by mouth 2 (two) times daily. XE0 Mega "Doterra"    [provider]  oxyCODONE (OXY IR/ROXICODONE) 5 MG immediate release tablet Take 1 tablet (5 mg total) by mouth every  6 (six) hours as needed for moderate pain. Patient not taking: Reported on 05/18/2021 05/22/18   Donnie Mesa, MD  SUPER B COMPLEX/C PO Take 1 tablet by mouth daily. Patient not taking: Reported on 05/18/2021    [provider]  tadalafil (CIALIS) 20 MG tablet Take 20 mg by mouth daily as needed for erectile dysfunction. Patient not taking: Reported on 05/18/2021    [provider]  tamsulosin (FLOMAX) 0.4 MG CAPS capsule Take 1 capsule (0.4 mg total) by mouth daily. Patient not taking: Reported on 05/18/2021 07/04/20   Marton Redwood III, MD  triamcinolone cream (KENALOG) 0.1 % Apply 1 application topically daily as needed (FOR DRY SKIN/ITCHY SKIN.). Apply to area after bath (DO NOT APPLY TO FACE) 01/03/14   [provider]    Family History    Family History  Problem Relation Age of Onset   Colon cancer Father    Heart disease Paternal Grandmother    Stomach cancer Neg Hx    Esophageal cancer Neg Hx    Pancreatic cancer Neg Hx    He indicated that his mother is alive. He indicated that his father is deceased. He indicated that his sister is alive. He indicated that his brother is alive. He indicated that his maternal grandmother is deceased. He indicated that his maternal grandfather is deceased. He indicated that his paternal grandmother is deceased. He indicated that his paternal grandfather is deceased. He indicated that the status of his neg hx is unknown.  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  Tobacco Use   Smoking status: Some Days    Types: Pipe   Smokeless tobacco: Former    Types: Chew    Quit date: 04/26/2011   Tobacco comments:    OCCASIONAL  PIPE SMOKER  ([place])  Vaping Use   Vaping Use: Never used  Substance and Sexual Activity   Alcohol use: Yes    Comment: RARE   Drug use: No   Sexual activity: Yes  Other Topics Concern   Not on file  Social History Narrative   Lives with wife Coralyn Mark   Retired Set designer   Caffeine use: coffee- 1 cup per day   Occasional pipe smoker   2 sons no daughters one of his sons is a Agricultural consultant   2 caffeinated drinks daily   Social Determinants of Radio broadcast assistant Strain: Not on file  Food Insecurity: Not on file  Transportation Needs: Not on file  Physical Activity: Not on file  Stress: Not on file  Social Connections: Not on file  Intimate Partner Violence: Not on file     Review of Systems    General:  No chills, fever, night sweats or weight changes.  Cardiovascular:  No chest pain, dyspnea on exertion, edema, orthopnea, palpitations, paroxysmal nocturnal dyspnea. Dermatological: No rash, lesions/masses Respiratory: No cough, dyspnea Urologic: No hematuria, dysuria Abdominal:   No nausea, vomiting, diarrhea, bright red blood per rectum, melena, or hematemesis Neurologic:  No visual changes, wkns, changes in mental status. All other systems reviewed and are otherwise negative except as noted above.  Physical Exam    VS:  BP 118/80 (BP Location: Left Arm, Patient Position: Sitting, Cuff Size:  Large)   Pulse 85   Ht 5' 10"$  (1.778 m)   Wt 231 lb 9.6 oz (105.1 kg)   BMI 33.23 kg/m  , BMI Body mass index is 33.23 kg/m. GEN: Well nourished, well developed, in no acute distress. HEENT: normal. Neck: Supple, no JVD, carotid bruits, or  masses. Cardiac: RRR, systolic click heard along right sternal border, no murmurs, rubs, or gallops. No clubbing, cyanosis, edema.  Radials/DP/PT 2+ and equal bilaterally.  Respiratory:  Respirations regular and unlabored, clear to auscultation bilaterally. GI: Soft, nontender, nondistended, BS + x 4. MS: no deformity or atrophy. Skin: warm and dry, no rash. Neuro:  Strength and sensation are intact. Psych: Normal affect.  Accessory Clinical Findings    Recent Labs: No results found for requested labs within last 365 days.   Recent Lipid Panel    Component Value Date/Time   CHOL 165 03/30/2017 0353   TRIG 91 03/30/2017 0353   HDL 24 (L) 03/30/2017 0353   CHOLHDL 6.9 03/30/2017 0353   VLDL 18 03/30/2017 0353   LDLCALC 123 (H) 03/30/2017 0353   LDLDIRECT 160.9 02/12/2013 0734         ECG personally reviewed by me today-normal sinus rhythm inferior infarct undetermined age 51 bpm no ectopy- No acute changes  Echocardiogram 07/30/2017  Study Conclusions   - Left ventricle: The cavity size was normal. There was moderate    concentric hypertrophy. Systolic function was normal. The    estimated ejection fraction was in the range of 50% to 55%. Mild    hypokinesis of the anteroseptal, inferior, and inferoseptal    myocardium. Doppler parameters are consistent with abnormal left    ventricular relaxation (grade 1 diastolic dysfunction). Doppler    parameters are consistent with indeterminate ventricular filling    pressure.  - Aortic valve: A 64m Edwards Magna Ease bioprosthesis was present    and functioning properly. There was no regurgitation. Peak    velocity (S): 238 cm/s. Mean gradient (S): 13 mm Hg.  - Aorta: Ascending aortic diameter: 39 mm (S).  - Ascending aorta: The ascending aorta was mildly dilated.  - Mitral valve: There was mild regurgitation.  - Right ventricle: The cavity size was normal. Wall thickness was    normal. Systolic function was normal.  -  Tricuspid valve: There was mild regurgitation.  - Pulmonary arteries: Systolic pressure was within the normal    range. PA peak pressure: 20 mm Hg (S).   -------------------------------------------------------------------  Study data:  Comparison was made to the study of 03/19/2017.  Study  status:  Routine.  Procedure:  The patient reported no pain pre or  post test. Transthoracic echocardiography for left ventricular  function evaluation and for assessment of valvular function. Image  quality was adequate.  Study completion:  There were no  complications.          Echocardiography.  M-mode, complete 2D,  spectral Doppler, and color Doppler.  Birthdate:  Patient  birthdate: [birth year]  Age:  Patient is 67yr old.  Sex:  Gender:  male.    BMI: 28.3 kg/m^2.  Blood pressure:     137/99  Patient  status:  Outpatient.  Study date:  Study date: 07/30/2017. Study  time: 08:49 AM.  [facility]   -------------------------------------------------------------------   -------------------------------------------------------------------  Left ventricle:  The cavity size was normal. There was moderate  concentric hypertrophy. Systolic function was normal. The estimated  ejection fraction was in the range of 50%  to 55%.  Regional wall  motion abnormalities:   Mild hypokinesis of the anteroseptal,  inferior, and inferoseptal myocardium. Doppler parameters are  consistent with abnormal left ventricular relaxation (grade 1  diastolic dysfunction). Doppler parameters are consistent with  indeterminate ventricular filling pressure.   -------------------------------------------------------------------  Aortic valve:  A 1m Edwards Magna Ease bioprosthesis was present  and functioning properly. Mobility was not restricted.  Doppler:  Transvalvular velocity was minimally increased. There was no  stenosis. There was no regurgitation.    VTI ratio of LVOT to  aortic valve: 0.36. Valve  area (VTI): 1.49 cm^2. Indexed valve area  (VTI): 0.69 cm^2/m^2. Peak velocity ratio of LVOT to aortic valve:  0.36. Valve area (Vmax): 1.49 cm^2. Indexed valve area (Vmax): 0.69  cm^2/m^2. Mean velocity ratio of LVOT to aortic valve: 0.36. Valve  area (Vmean): 1.5 cm^2. Indexed valve area (Vmean): 0.69 cm^2/m^2.    Mean gradient (S): 13 mm Hg. Peak gradient (S): 23 mm Hg.   -------------------------------------------------------------------  Aorta: Aortic root: The aortic root was normal in size.  Ascending aorta: The ascending aorta was mildly dilated.   -------------------------------------------------------------------  Mitral valve:   Structurally normal valve.   Mobility was not  restricted.  Doppler:  Transvalvular velocity was within the normal  range. There was no evidence for stenosis. There was mild  regurgitation.   -------------------------------------------------------------------  Left atrium:  The atrium was normal in size.   -------------------------------------------------------------------  Right ventricle:  The cavity size was normal. Wall thickness was  normal. Systolic function was normal.   -------------------------------------------------------------------  Pulmonic valve:    Structurally normal valve.   Cusp separation was  normal.  Doppler:  Transvalvular velocity was within the normal  range. There was no evidence for stenosis. There was no  regurgitation.   -------------------------------------------------------------------  Tricuspid valve:   Structurally normal valve.    Doppler:  Transvalvular velocity was within the normal range. There was mild  regurgitation.   -------------------------------------------------------------------  Pulmonary artery:   The main pulmonary artery was normal-sized.  Systolic pressure was within the normal range.   -------------------------------------------------------------------  Right atrium:  The atrium was normal  in size.   -------------------------------------------------------------------  Pericardium: There was no pericardial effusion.   -------------------------------------------------------------------  Systemic veins:  Inferior vena cava: The vessel was normal in size. The  respirophasic diameter changes were in the normal range (>= 50%),  consistent with normal central venous pressure.    Chest CT 06/21/2021   IMPRESSION: 1. Stable chest CTA. 2. 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 Normal caliber of the ascending and descending thoracic aorta. Postoperative changes from aortic valve replacement. 3. No acute chest abnormality. 4. Stable small pulmonary nodules. Minimal change since 2020 and these are compatible with small benign pulmonary nodules. 5. Hepatic steatosis. 6. Bilateral renal calculi.     Electronically Signed   By: AMarkus DaftM.D.   On: 06/21/2021 15:38  Assessment & Plan   1.  Essential hypertension-BP today 118/80 Continue losartan, metoprolol  Heart healthy low-sodium diet-salty 6 given Increase physical activity as tolerated  Aortic insufficiency-status post aortic valve replacement by Dr. BCyndia Bent2/19.  No increased DOE or activity intolerance.  Echocardiogram 07/30/2017 showed normal EF and well-functioning aortic valve. Continue SBE prophylaxis Follows with Dr. BCyndia Bent Dilated aortic root-chest CT 06/21/2021 showed stable dilation of the aortic root measuring 4.3 cm Follows with Dr. BCyndia Bent Hyperlipidemia-LDL 97 on1/30/24. Continue statin therapy Heart healthy low-sodium high-fiber diet  Follows with PCP  Disposition: Follow-up with Dr. Stanford Breed in 12 months.   Jossie Ng. Fleda Pagel NP-C     06/14/2022, 2:38 PM [place] [address] Office [phone] Fax [phone]    I spent 14 minutes examining this patient, reviewing medications, and using patient centered  shared decision making involving her cardiac care.  Prior to her visit I spent greater than 20 minutes reviewing her past medical history,  medications, and prior cardiac tests.

## 2022-06-14 ENCOUNTER — Ambulatory Visit: Payer: Medicare Other | Attending: General Practice | Admitting: General Practice

## 2022-06-14 ENCOUNTER — Encounter: Payer: Self-pay | Admitting: General Practice

## 2022-06-14 DIAGNOSIS — I77810 Thoracic aortic ectasia: Secondary | ICD-10-CM | POA: Insufficient documentation

## 2022-06-14 DIAGNOSIS — Z952 Presence of prosthetic heart valve: Secondary | ICD-10-CM | POA: Diagnosis not present

## 2022-06-14 DIAGNOSIS — E7800 Pure hypercholesterolemia, unspecified: Secondary | ICD-10-CM | POA: Diagnosis present

## 2022-06-14 DIAGNOSIS — I10 Essential (primary) hypertension: Secondary | ICD-10-CM | POA: Diagnosis not present

## 2022-06-14 NOTE — Patient Instructions (Signed)
Medication Instructions:  The current medical regimen is effective;  continue present plan and medications as directed. Please refer to the Current Medication list given to you today.  *If you need a refill on your cardiac medications before your next appointment, please call your pharmacy*  Lab Work: NONE If you have labs (blood work) drawn today and your tests are completely normal, you will receive your results only by: [place] (if you have MyChart) OR  A paper copy in the mail If you have any lab test that is abnormal or we need to change your treatment, we will call you to review the results.  Testing/Procedures: NONE   Follow-Up: At [facility], you and your health needs are our priority.  As part of our continuing mission to provide you with exceptional heart care, we have created designated Provider Care Teams.  These Care Teams include your primary Cardiologist (physician) and Advanced Practice Providers (APPs -  Physician Assistants and Nurse Practitioners) who all work together to provide you with the care you need, when you need it.  Your next appointment:   12 month(s)  Provider:   Kirk Ruths, MD  or Coletta Memos, FNP       Other Instructions MAINTAIN YOUR PHYSICAL ACTIVITY  PLEASE READ AND FOLLOW ATTACHED  SALTY 6

## 2022-07-24 ENCOUNTER — Ambulatory Visit (INDEPENDENT_AMBULATORY_CARE_PROVIDER_SITE_OTHER): Payer: Medicare Other | Admitting: Neurology

## 2022-07-24 ENCOUNTER — Encounter: Payer: Self-pay | Admitting: Neurology

## 2022-07-24 VITALS — BP 127/79 | HR 73 | Ht 70.0 in | Wt 236.0 lb

## 2022-07-24 DIAGNOSIS — E538 Deficiency of other specified B group vitamins: Secondary | ICD-10-CM | POA: Diagnosis not present

## 2022-07-24 DIAGNOSIS — R7309 Other abnormal glucose: Secondary | ICD-10-CM | POA: Diagnosis not present

## 2022-07-24 DIAGNOSIS — R200 Anesthesia of skin: Secondary | ICD-10-CM | POA: Insufficient documentation

## 2022-07-24 DIAGNOSIS — E531 Pyridoxine deficiency: Secondary | ICD-10-CM

## 2022-07-24 DIAGNOSIS — E859 Amyloidosis, unspecified: Secondary | ICD-10-CM

## 2022-07-24 DIAGNOSIS — E519 Thiamine deficiency, unspecified: Secondary | ICD-10-CM | POA: Diagnosis not present

## 2022-07-24 NOTE — Progress Notes (Signed)
[name]    Provider:  Dr Jaynee Anderson Referring Provider: Antony Contras, MD Primary Care Physician:  [name] Contras, MD  CC:  numbness in the left big toe  HPI 07/24/2022:   [name] Anderson is a 67 y.o. male here as a referral from [name]. Moreen Anderson for cerebellar ectopia. Past medical history of hyperlipidemia, erectile dysfunction, OSA, HTN, kidney stones,remote testicular cancer, diastolic dysfunction, aortic regurgitation s/p AVR, hepatic statosis, cerebellar tonsillar ectopia. We saw him many years ago for headaches which resoled(see below) today new CC.   Numbness started 3 months ago left big toe, feels cold, no burning, no tingling, no pain, feels cold also feels cold to touch. In the left toe (maybe the right toe). He gets a weird feeling in the legs, elevating them helps, no associated with the toe as far as he knows, no inciting events to the numbness of the toe, right might be starting just a little as well. No weakness. He has back pain from being a firfighter, years of low back pain,no radicular symptoms. The tip of the great toe. No swelling. No rash. Nothing in the hands. MRi in 2018 without any stenosis at L5/S1. No personal or family history of autoimmune disease. No alcohol. Comes and goes. Not worse in bed. Little numb right now. Nothing makes it better, nothing known to make it worse. Happens daily, feels it all day long, sometimes a little worse. Has some knee pain. No neck pain. No other focal neurologic deficits, associated symptoms, inciting events or modifiable factors.    Reviewed notes, labs and imaging from outside physicians, which showed:   05/29/2022: B12 348 CBC unremarkable CMP unremarkable BUN 19 creat 1.14  TSH 3.4 Hep C neg   MRI 2018: CLINICAL DATA:  Back pain radiating laterally for 1 week. No injury.   EXAM: MRI LUMBAR SPINE WITHOUT CONTRAST   TECHNIQUE: Multiplanar, multisequence MR imaging of the lumbar spine was performed. No intravenous  contrast was administered.   COMPARISON:  CT abdomen and pelvis fell Sep 09, 2015   FINDINGS: SEGMENTATION: For the purposes of this report, the last well-formed intervertebral disc will be described as L5-S1.   ALIGNMENT: Maintenance of the lumbar lordosis. No malalignment.   VERTEBRAE:Vertebral bodies are intact. Mild L2-3 through L4-5 disc height loss, with slight desiccation. Acute on chronic discogenic endplate changes [id number]. No suspicious bone marrow signal.   CONUS MEDULLARIS: Conus medullaris terminates at T12-L1 and demonstrates normal morphology and signal characteristics. Cauda equina is normal.   PARASPINAL AND SOFT TISSUES: Included prevertebral and paraspinal soft tissues are normal. 15 mm cyst RIGHT kidney as seen on prior imaging.   DISC LEVELS:   L1-2 and L2-3: No disc bulge, canal stenosis nor neural foraminal narrowing.   L3-4: Small broad-based disc bulge, small LEFT extraforaminal disc protrusion. No canal stenosis or neural foraminal narrowing.   L4-5: Small broad-based disc bulge. Mild facet arthropathy without canal stenosis or neural foraminal narrowing.   L5-S1: No disc bulge. Moderate facet arthropathy. No canal stenosis. Mild LEFT greater than RIGHT neural foraminal narrowing. L2-3:   IMPRESSION: Moderate L5-S1 facet arthropathy, otherwise early degenerative changes lumbar spine without canal stenosis.   Mild L5-S1 neural foraminal narrowing.  Patient complains of symptoms per HPI as well as the following symptoms: numbness in feet . Pertinent negatives and positives per HPI. All others negative   HPI 01/18/2016:  [name] Anderson is a 67 y.o. male here as a referral from [name]. Moreen Anderson for cerebellar ectopia.  Past medical history of hyperlipidemia, erectile dysfunction, kidney stones, testicular cancer, diastolic dysfunction, aortic regurgitation, cerebellar tonsillar ectopia. He is feeling a lot better, the prednisone helped. He had severe headache  that started about a week ago, no inciting events or trauma or previous illnesses. It just came on, maybe stress. Grandmother and son had migraines. Headache was on the right side and felt like a screwdriver on the side of the head, severe, throbbing, pounding, some light sensitivity, nausea. He did not have vision changes. No dizziness. Lasted 4 days. Resolved after a steroid taper and flexeril. No history of headaches. He has some chronic pain in the neck, with the headache had numbness on the right side of the face, and vision changes. Denies any significant problems with balance, Poor hand coordination, Numbness and tingling of the hands and feet Dizziness Difficulty swallowing, gagging, choking and vomiting.    Reviewed notes, labs and imaging from outside physicians, which showed:   Reviewed notes her medical physician. Patient presented with chief complaint of severe headache. Symptoms were abrupt on Friday evening 01/06/2016. Symptoms worsened over the weekend with associated orbital pain. Pain is constant, sharp and time and throbbing others. He had a number of episodes of nausea and vomiting associated, 2 days ago. He has developed light sensitivity, which is described as mild. His headaches over the weekend with the worst of his life at which time he took some leftover Dilaudid-HP. His current level of discomfort is 5 on a scale chain. He tried Excedrin Migraine so far. He does not have a history of migraine headaches. He denies any recent upper respiratory symptoms. No recent head traumas. No visual changes or focal weakness. He was started on prednisone 50 taper and Flexeril.  CBC 01/10/2016 normal, TSH 02/18/2015 2.78, CMP was normal with creatinine 0.93  Personally reviewed CT of the head images 01/10/2016 and agree with the following:  FINDINGS: Brain: There is no evidence of acute cortical infarct, intracranial hemorrhage, mass, midline shift, or extra-axial fluid collection. The  ventricles and sulci are normal. Cerebellar tonsillar ectopia is incompletely visualized.   Vascular: No hyperdense vessel or unexpected calcification.   Skull: No fracture or focal osseous lesion.   Sinuses/Orbits: Unremarkable orbits. Left greater than right sphenoid sinus mucosal thickening. Posterior left ethmoid air cell opacification. Visualized mastoid air cells are clear.   Other: None.   IMPRESSION: 1. No evidence of acute intracranial abnormality. 2. Partially visualized, though most likely mild, cerebellar tonsillar ectopia.      Review of Systems: Patient complains of symptoms per HPI as well as the following symptoms: no CP, no SOB. Pertinent negatives per HPI. All others negative.   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  Tobacco Use   Smoking status: Some Days    Types: Pipe   Smokeless tobacco: Former    Types: Chew    Quit date: 04/26/2011   Tobacco comments:    OCCASIONAL  PIPE SMOKER  ([place])  Vaping Use   Vaping Use: Never used  Substance and Sexual Activity   Alcohol use: Yes    Comment: RARE   Drug use: No   Sexual activity: Yes  Other Topics Concern   Not on file  Social History Narrative   Lives with  wife Coralyn Mark   Retired Set designer   Caffeine use: coffee- 1 cup per day   Occasional pipe smoker   2 sons no daughters one of his sons is a Agricultural consultant   2 caffeinated drinks daily   Social Determinants of Health   Financial Resource Strain: Not on file  Food Insecurity: Not on file  Transportation Needs: Not on file  Physical Activity: Not on file  Stress: Not on file  Social Connections: Not on file  Intimate Partner Violence: Not on file    Family History  Problem Relation Age of Onset   Colon cancer Father     Heart disease Paternal Grandmother    Stomach cancer Neg Hx    Esophageal cancer Neg Hx    Pancreatic cancer Neg Hx    Neuropathy Neg Hx     Past Medical History:  Diagnosis Date   Allergy    Anemia    Barrett's esophagus - short segment 03/13/2017   Basal cell carcinoma 02/17/1998   Left Post Auriular (tx p bx)   BCC (basal cell carcinoma of skin) 01/13/2002   Infra Tip Nose (MOH's)   BCC (basal cell carcinoma of skin) 03/28/2006   Right Neck (curet and excision)   BCC (basal cell carcinoma of skin) 03/28/2006   Right Ear Rim (curet, excision, 5FU)   BCC (basal cell carcinoma of skin) 04/28/2012   Right Ear Rim (MOH's)   BCC (basal cell carcinoma of skin) 09/09/2012   Right Eyebrow (curet and 5FU)   BCC (basal cell carcinoma of skin) 09/09/2012   Right Back Sup (tx p bx)   BCC (basal cell carcinoma of skin) 09/09/2012   Right Back Inf (tx p bx)   Cancer ([place]) 09/11/2013   testicular   Complication of anesthesia    hard to wake  - 20 years ago   Diverticulitis    ED (erectile dysfunction)    Endocarditis    /notes 03/29/2017   H/O aneurysm 03/05/2017   High cholesterol    History of basal cell carcinoma excision    02/2002   --  NOSE  &   2013  RIGHT EAR   History of kidney stones    Hyperlipidemia    Hypertension    Mild obstructive sleep apnea    PER STUDY 01-18-2005--  NO CPAP OR MOUTH GUARD   Moderate aortic valve insufficiency    Multiple pulmonary nodules    PER CT--  PROBABLE  GRANULOMATOUS - patient is not aware of this   Personal history of colonic polyps    TUBULAR ADENOMA--    Seasonal allergies    Superficial basal cell carcinoma (BCC) 03/24/2014   Left Lower Back   Superficial basal cell carcinoma (BCC) 01/27/2018   Above Left Eyebrow (curet and 5FU)   Superficial basal cell carcinoma (BCC) 01/27/2018   Right Upper Abdomen (curet and 5FU)   Superficial nodular basal cell carcinoma (BCC) 01/27/2018   Left Scapula (curet and 5FU)   Superficial  nodular basal cell carcinoma (BCC) 01/27/2018   Right Upper Thigh (curet and 5FU)   Superficial nodular infiltrative basal cell carcinoma (BCC) 01/27/2018   Right Lower Back Mid (curet and 5FU)   Testicular cancer [facility])    Testicular mass    RIGHT    Past Surgical History:  Procedure Laterality Date   AORTIC VALVE REPLACEMENT N/A 06/05/2017   Procedure: AORTIC VALVE REPLACEMENT (AVR) using 2mm Magna Ease valve;  Surgeon: [name], MD;  Location: [facility]  OR;  Service: Open Heart Surgery;  Laterality: N/A;   COLONOSCOPY  01/24/06, 07/10/11   COLONOSCOPY WITH ESOPHAGOGASTRODUODENOSCOPY (EGD)     EXTRACORPOREAL SHOCK WAVE LITHOTRIPSY     EXTRACORPOREAL SHOCK WAVE LITHOTRIPSY Left 07/04/2020   Procedure: EXTRACORPOREAL SHOCK WAVE LITHOTRIPSY (ESWL);  Surgeon: Lucas Mallow, MD;  Location: [facility];  Service: Urology;  Laterality: Left;   HERNIA REPAIR     MOHS SURGERY  02/2002  &  2013   TIP OF NOSE-///     RIGHT EAR   ORCHIECTOMY Right 09/11/2013   Procedure:  RIGHT ORCHIECTOMY;  Surgeon: Jorja Loa, MD;  Location: [facility];  Service: Urology;  Laterality: Right;   RIGHT/LEFT HEART CATH AND CORONARY ANGIOGRAPHY N/A 04/18/2017   Procedure: RIGHT/LEFT HEART CATH AND CORONARY ANGIOGRAPHY;  Surgeon: Sherren Mocha, MD;  Location: [facility] [facility];  Service: Cardiovascular;  Laterality: N/A;   SCROTAL EXPLORATION Right 09/11/2013   Procedure: RIGHT INGUINAL EXPLORATION;  Surgeon: Jorja Loa, MD;  Location: [facility];  Service: Urology;  Laterality: Right;   TEE WITHOUT CARDIOVERSION N/A 03/29/2017   Procedure: TRANSESOPHAGEAL ECHOCARDIOGRAM (TEE);  Surgeon: Lelon Perla, MD;  Location: [facility] ENDOSCOPY;  Service: Cardiovascular;  Laterality: N/A;   TEE WITHOUT CARDIOVERSION N/A 06/05/2017   Procedure: TRANSESOPHAGEAL ECHOCARDIOGRAM (TEE);  Surgeon: [name], MD;  Location: [place];  Service: Open Heart Surgery;   Laterality: N/A;   TRANSTHORACIC ECHOCARDIOGRAM  02-25-2013   [name]   NORMAL LVSF/  EF 55-60%/   GRADE 2 DIASTOLIC DYSFUNCTION/  ECCENTRIC MODERATE AORTIC INSUFFICIENCY WITHOUT STENOSIS/  MILD DILATED AORTIC ROOT/ TRIVIAL MR, TR,  & PR   UMBILICAL HERNIA REPAIR  [id number]   UMBILICAL HERNIA REPAIR  05/22/2018   WITH MESH   UMBILICAL HERNIA REPAIR N/A 05/22/2018   Procedure: LAPAROSCOPIC REPAIR OF RECURRENT UMBILICAL HERNIA WITH MESH;  Surgeon: Donnie Mesa, MD;  Location: [place];  Service: General;  Laterality: N/A;   URETEROSCOPIC LASER LITHOTRIPSY STONE EXTRACTION  2007    Current Outpatient Medications  Medication Sig Dispense Refill   acetaminophen (TYLENOL) 500 MG tablet Take 1 tablet (500 mg total) by mouth every 6 (six) hours as needed (for pain.). 30 tablet 0   aluminum chloride (DRYSOL) 20 % external solution Apply 1 application  topically 3 (three) times daily as needed (for sweating.).     amoxicillin (AMOXIL) 500 MG tablet For dental procedures     Ascorbic Acid (VITAMIN C) 1000 MG tablet Take 1,000 mg by mouth daily.     aspirin EC 81 MG tablet Take 81 mg by mouth daily.     atorvastatin (LIPITOR) 10 MG tablet Take 1 tablet (10 mg total) by mouth daily at 6 PM. 30 tablet 6   betamethasone valerate (VALISONE) 0.1 % cream Apply 1 application  topically 2 (two) times daily as needed (for skin irritation.).     cetirizine (ZYRTEC) 10 MG tablet Take 10 mg by mouth daily as needed for allergies.     clobetasol (OLUX) 0.05 % topical foam Apply to affected area after bathing 50 g 11   ECHINACEA PO Take 760 mg by mouth daily.     fluorometholone (FML) 0.1 % ophthalmic suspension Place 1 drop into both eyes 3 (three) times daily as needed (for irritated/itchy eyes.).     fluticasone (FLONASE) 50 MCG/ACT nasal spray Place 1 spray into both nostrils at bedtime as needed for allergies or rhinitis.  loratadine (CLARITIN) 10 MG tablet Take 10 mg by mouth daily as needed for  allergies.     losartan (COZAAR) 50 MG tablet TAKE 1 TABLET DAILY 90 tablet 3   metoprolol tartrate (LOPRESSOR) 25 MG tablet TAKE 1/2 TABLET (12.5MG     TOTAL) TWO TIMES A DAY 90 tablet 3   Naphazoline-Pheniramine 0.027-0.315 % SOLN Place 1-2 drops into both eyes 3 (three) times daily as needed (for allergy eyes.).     oxyCODONE (OXY IR/ROXICODONE) 5 MG immediate release tablet Take 1 tablet (5 mg total) by mouth every 6 (six) hours as needed for moderate pain. 24 tablet 0   tadalafil (CIALIS) 20 MG tablet Take 20 mg by mouth daily as needed for erectile dysfunction.     tamsulosin (FLOMAX) 0.4 MG CAPS capsule Take 1 capsule (0.4 mg total) by mouth daily. 14 capsule 1   triamcinolone cream (KENALOG) 0.1 % Apply 1 application topically daily as needed (FOR DRY SKIN/ITCHY SKIN.). Apply to area after bath (DO NOT APPLY TO FACE)  1   No current facility-administered medications for this visit.    Allergies as of 07/24/2022 - Review Complete 07/24/2022  Allergen Reaction Noted   Atorvastatin Other (See Comments) 04/10/2021    Vitals: BP 127/79   Pulse 73   Ht 5\' 10"  (1.778 m)   Wt 236 lb (107 kg)   BMI 33.86 kg/m  Last Weight:  Wt Readings from Last 1 Encounters:  07/24/22 236 lb (107 kg)   Last Height:   Ht Readings from Last 1 Encounters:  07/24/22 5\' 10"  (1.778 m)    Physical exam: Exam: Gen: NAD, conversant, well nourised, obese, well groomed                     CV: RRR, no MRG. No Carotid Bruits. No peripheral edema, warm, nontender Eyes: Conjunctivae clear without exudates or hemorrhage MSK: no LE swelling, feet warm, hair intact on the legs, no rash,   Neuro: Detailed Neurologic Exam  Speech:    Speech is normal; fluent and spontaneous with normal comprehension.  Cognition:    The patient is oriented to person, place, and time;     recent and remote memory intact;     language fluent;     normal attention, concentration,     fund of knowledge Cranial Nerves:     The pupils are equal, round, and reactive to light. Attempted fundoscopy could not visualize.  Visual fields are full to finger confrontation. Extraocular movements are intact. Trigeminal sensation is intact and the muscles of mastication are normal. The face is symmetric. The palate elevates in the midline. Hearing intact. Voice is normal. Shoulder shrug is normal. The tongue has normal motion without fasciculations.   Coordination:    Normal finger to nose and heel to shin. Normal rapid alternating movements.   Gait:    Heel-toe and tandem gait are normal.   Motor Observation:    No asymmetry, no atrophy, and no involuntary movements noted. Tone:    Normal muscle tone.    Posture:    Posture is normal. normal erect    Strength:    Strength is V/V in the upper and lower limbs.      Sensation: intact to LT except slight numbness at the tip of left > riht toe, pin prick decreased to the mid ventral (top) of the foot, possibly a litle more numb lateral outside leg. Intact vibration.      Reflex Exam:  DTR's:    Deep tendon reflexes in the upper and lower extremities are brisk in the lowers bilaterally, 1-2+ uppers Toes:    The toes are downgoing bilaterally.   Clonus:    2 beats clonus right AJ  Neg hoffmans    Assessment/Plan:  [name] [facility] is a 67 y.o. male here as a referral from [name]. Moreen Anderson for cerebellar ectopia. Past medical history of hyperlipidemia, erectile dysfunction, OSA, HTN, kidney stones,remote testicular cancer, diastolic dysfunction, aortic regurgitation s/p AVR, hepatic statosis, cerebellar tonsillar ectopia. We saw him many years ago for headaches which resoled(see below) today new CC. Marland Kitchen He reports numbness left > Right great toe. Discussed the causes of peripheral neuropathy, the most common being diabetes which patient does not report having but will check hgba1c (he is obese with other risk factors for diabetes/prediabetes). Discussed millions of people in the  [place] states have some form of peripheral neuropathy. This is a condition that develops as a result of damage to the peripheral nervous system. 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 There are multiple causes including metabolic, toxic, infectious and endocrine disorders, small vessel disease, autoimmune diseases, even central obesity, and others and we discussed in depth. We'll perform serum neuropathy screening and also order an EMG nerve conduction study.   Blood testing EMG/NCS Wide differntial of what can cause foot numbness for example peroneal neuropathy, L5 pinched nerve neuropathy, peripheral systemic neuropathy(central obesity or exposure to toxins or diabetes etc) Brisk lower DTRs with clonus right AJ may consider spine testing  Orders Placed This Encounter  Procedures   Hemoglobin A1c   ANA, IFA (with reflex)   Sjogren's syndrome antibods(ssa + ssb)   Rheumatoid factor   B12 and Folate Panel   Methylmalonic acid, serum   Vitamin B1   Vitamin B6   GeneSeq PLUS, TTR   Multiple Myeloma Panel (SPEP&IFE w/QIG)   NCV with EMG(electromyography)    Cc: Gara Kroner, MD  Sarina Ill, MD  [facility] [address] [place], [place] [address]  Phone [phone] Fax [phone]  I spent over 60 minutes of face-to-face and non-face-to-face time with patient on the  1. Numbness of toes   2. Elevated glucose   3. screen for Vitamin B12 deficiency   4. screen for Vitamin B1 deficiency   5. screen for Vitamin B6 deficiency   6. screen for Amyloidosis, unspecified type ([place])    diagnosis.  This included previsit chart review, lab review, study review, order entry, electronic health record documentation,  patient education on the different diagnostic and therapeutic options, counseling and coordination of care, risks and benefits of management, compliance, or risk factor reduction

## 2022-07-24 NOTE — Patient Instructions (Addendum)
Blood testing EMG/NCS Wide differntial of what can cause foot numbness for example peroneal neuropathy, L5 pinched nerve neuropathy, peripheral systemic neuropathy(central obesity or exposure to toxins or diabetes etc)  Electromyoneurogram Electromyoneurogram is a test to check how well your muscles and nerves are working. This procedure includes the combined use of electromyogram (EMG) and nerve conduction study (NCS). EMG is used to evaluate muscles and the nerves that control those muscles. NCS, which is also called electroneurogram, measures how well your nerves conduct electricity. The procedures should be done together to check if your muscles and nerves are healthy. If the results of the tests are abnormal, this may indicate disease or injury, such as a neuromuscular disease or peripheral nerve damage. Tell a health care provider about: Any allergies you have. All medicines you are taking, including vitamins, herbs, eye drops, creams, and over-the-counter medicines. Any bleeding problems you have. Any surgeries you have had. Any medical conditions you have. What are the risks? Generally, this is a safe procedure. However, problems may occur, including: Bleeding or bruising. Infection where the electrodes were inserted. What happens before the test? Medicines Take all of your usually prescribed medications before this testing is performed. Do not stop your blood thinners unless advised by your prescribing physician. General instructions Your health care provider may ask you to warm the limb that will be checked with warm water, hot pack, or wrapping the limb in a blanket. Do not use lotions or creams on the same day that you will be having the procedure. What happens during the test? For EMG  Your health care provider will ask you to stay in a position so that the muscle being studied can be accessed. You will be sitting or lying down. You may be given a medicine to numb the area  (local anesthetic) and the skin will be disinfected. A very thin needle that has an electrode will be inserted into your muscle, one muscle at a time. Typically, multiple muscles are evaluated during a single study. Another small electrode will be placed on your skin near the muscle. Your health care provider will ask you to continue to remain still. The electrodes will record the electrical activity of your muscles. You may see this on a monitor or hear it in the room. After your muscles have been studied at rest, your health care provider will ask you to contract or flex your muscles. The electrodes will record the electrical activity of your muscles. Your health care provider will remove the electrodes and the electrode needle when the procedure is finished. The procedure may vary among health care providers and hospitals. For NCS  An electrode that records your nerve activity (recording electrode) will be placed on your skin by the muscle that is being studied. An electrode that is used as a reference (reference electrode) will be placed near the recording electrode. A paste or gel will be applied to your skin between the recording electrode and the reference electrode. Your nerve will be stimulated with a mild shock. The speed of the nerves and strength of response is recorded by the electrodes. Your health care provider will remove the electrodes and the gel when the procedure is finished. The procedure may vary among health care providers and hospitals. What can I expect after the test? It is up to you to get your test results. Ask your health care provider, or the department that is doing the test, when your results will be ready. Your health care provider  may: Give you medicines for any pain. Monitor the insertion sites to make sure that bleeding stops. You should be able to drive yourself to and from the test. Discomfort can persist for a few hours after the test, but should be better  the next day. Contact a health care provider if: You have swelling, redness, or drainage at any of the insertion sites. Summary Electromyoneurogram is a test to check how well your muscles and nerves are working. If the results of the tests are abnormal, this may indicate disease or injury. This is a safe procedure. However, problems may occur, such as bleeding and infection. Your health care provider will do two tests to complete this procedure. One checks your muscles (EMG) and another checks your nerves (NCS). It is up to you to get your test results. Ask your health care provider, or the department that is doing the test, when your results will be ready. This information is not intended to replace advice given to you by your health care provider. Make sure you discuss any questions you have with your health care provider. Document Revised: 12/28/2020 Document Reviewed: 11/27/2020 Elsevier Patient Education  [place].  Peripheral Neuropathy Peripheral neuropathy is a type of nerve damage. It affects nerves that carry signals between the spinal cord and the arms, legs, and the rest of the body (peripheral nerves). It does not affect nerves in the spinal cord or brain. In peripheral neuropathy, one nerve or a group of nerves may be damaged. Peripheral neuropathy is a broad category that includes many specific nerve disorders, like diabetic neuropathy, hereditary neuropathy, and carpal tunnel syndrome. What are the causes? This condition may be caused by: Certain diseases, such as: Diabetes. This is the most common cause of peripheral neuropathy. Autoimmune diseases, such as rheumatoid arthritis and systemic lupus erythematosus. Nerve diseases that are passed from parent to child (inherited). Kidney disease. Thyroid disease. Other causes may include: Nerve injury. Pressure or stress on a nerve that lasts a long time. Lack (deficiency) of B vitamins. This can result from  alcoholism, poor diet, or a restricted diet. Infections. Some medicines, such as cancer medicines (chemotherapy). Poisonous (toxic) substances, such as lead and mercury. Too little blood flowing to the legs. Central obesity: Normoglycemic obese patients have a high prevalence of neuropathy indicating that obesity alone may be sufficient to cause neuropathy. Waist circumference, but not general obesity, is significantly associated with neuropathy In some cases, the cause of this condition is not known. What are the signs or symptoms? Symptoms of this condition depend on which of your nerves is damaged. Symptoms in the legs, hands, and arms can include: Loss of feeling (numbness) in the feet, hands, or both. Tingling in the feet, hands, or both. Burning pain. Very sensitive skin. Weakness. Not being able to move a part of the body (paralysis). Clumsiness or poor coordination. Muscle twitching. Loss of balance. Symptoms in other parts of the body can include: Not being able to control your bladder. Feeling dizzy. Sexual problems. How is this diagnosed? Diagnosing and finding the cause of peripheral neuropathy can be difficult. Your health care provider will take your medical history and do a physical exam. A neurological exam will also be done. This involves checking things that are affected by your brain, spinal cord, and nerves (nervous system). For example, your health care provider will check your reflexes, how you move, and what you can feel. You may have other tests, such as: Blood tests. Electromyogram (EMG) and  nerve conduction tests. These tests check nerve function and how well the nerves are controlling the muscles. Imaging tests, such as a CT scan or MRI, to rule out other causes of your symptoms. Removing a small piece of nerve to be examined in a lab (nerve biopsy). Removing and examining a small amount of the fluid that surrounds the brain and spinal cord (lumbar  puncture). How is this treated? Treatment for this condition may involve: Treating the underlying cause of the neuropathy, such as diabetes, kidney disease, or vitamin deficiencies. Stopping medicines that can cause neuropathy, such as chemotherapy. Medicine to help relieve pain. Medicines may include: Prescription or over-the-counter pain medicine. Anti-seizure medicine. Antidepressants. Pain-relieving patches that are applied to painful areas of skin. Surgery to relieve pressure on a nerve or to destroy a nerve that is causing pain. Physical therapy to help improve movement and balance. Devices to help you move around (assistive devices). Follow these instructions at home: Medicines Take over-the-counter and prescription medicines only as told by your health care provider. Do not take any other medicines without first asking your health care provider. Ask your health care provider if the medicine prescribed to you requires you to avoid driving or using machinery. Lifestyle  Do not use any products that contain nicotine or tobacco. These products include cigarettes, chewing tobacco, and vaping devices, such as e-cigarettes. Smoking keeps blood from reaching damaged nerves. If you need help quitting, ask your health care provider. Avoid or limit alcohol. Too much alcohol can cause a vitamin B deficiency, and vitamin B is needed for healthy nerves. Eat a healthy diet. This includes: Eating foods that are high in fiber, such as beans, whole grains, and fresh fruits and vegetables. Limiting foods that are high in fat and processed sugars, such as fried or sweet foods. General instructions  If you have diabetes, work closely with your health care provider to keep your blood sugar under control. If you have numbness in your feet: Check every day for signs of injury or infection. Watch for redness, warmth, and swelling. Wear padded socks and comfortable shoes. These help protect your  feet. Develop a good support system. Living with peripheral neuropathy can be stressful. Consider talking with a mental health specialist or joining a support group. Use assistive devices and attend physical therapy as told by your health care provider. This may include using a walker or a cane. Keep all follow-up visits. This is important. Where to find more information [employer] of Neurological Disorders: [email or web address] Contact a health care provider if: You have new signs or symptoms of peripheral neuropathy. You are struggling emotionally from dealing with peripheral neuropathy. Your pain is not well controlled. Get help right away if: You have an injury or infection that is not healing normally. You develop new weakness in an arm or leg. You have fallen or do so frequently. Summary Peripheral neuropathy is when the nerves in the arms or legs are damaged, resulting in numbness, weakness, or pain. There are many causes of peripheral neuropathy, including diabetes, pinched nerves, vitamin deficiencies, autoimmune disease, and hereditary conditions. Diagnosing and finding the cause of peripheral neuropathy can be difficult. Your health care provider will take your medical history, do a physical exam, and do tests, including blood tests and nerve function tests. Treatment involves treating the underlying cause of the neuropathy and taking medicines to help control pain. Physical therapy and assistive devices may also help. This information is not intended to replace advice given to you  by your health care provider. Make sure you discuss any questions you have with your health care provider. Document Revised: 12/20/2020 Document Reviewed: 12/20/2020 Elsevier Patient Education  [place].

## 2022-07-25 LAB — GENESEQ PLUS, TTR

## 2022-07-27 LAB — GENESEQ PLUS, TTR

## 2022-07-30 ENCOUNTER — Encounter: Payer: Self-pay | Admitting: Neurology

## 2022-08-07 LAB — HEMOGLOBIN A1C
Est. average glucose Bld gHb Est-mCnc: 126 mg/dL
Hgb A1c MFr Bld: 6 % — ABNORMAL HIGH (ref 4.8–5.6)

## 2022-08-07 LAB — MULTIPLE MYELOMA PANEL, SERUM
Albumin SerPl Elph-Mcnc: 4.1 g/dL (ref 2.9–4.4)
Albumin/Glob SerPl: 1.3 (ref 0.7–1.7)
Alpha 1: 0.2 g/dL (ref 0.0–0.4)
Alpha2 Glob SerPl Elph-Mcnc: 0.7 g/dL (ref 0.4–1.0)
B-Globulin SerPl Elph-Mcnc: 1.1 g/dL (ref 0.7–1.3)
Gamma Glob SerPl Elph-Mcnc: 1.4 g/dL (ref 0.4–1.8)
Globulin, Total: 3.4 g/dL (ref 2.2–3.9)
IgA/Immunoglobulin A, Serum: 259 mg/dL (ref 61–437)
IgG (Immunoglobin G), Serum: 1499 mg/dL (ref 603–1613)
IgM (Immunoglobulin M), Srm: 63 mg/dL (ref 20–172)
Total Protein: 7.5 g/dL (ref 6.0–8.5)

## 2022-08-07 LAB — SJOGREN'S SYNDROME ANTIBODS(SSA + SSB)
ENA SSA (RO) Ab: 0.2 AI (ref 0.0–0.9)
ENA SSB (LA) Ab: <0.2 AI (ref 0.0–0.9)

## 2022-08-07 LAB — GENESEQ PLUS, TTR

## 2022-08-07 LAB — VITAMIN B1: Thiamine: 146.4 nmol/L (ref 66.5–200.0)

## 2022-08-07 LAB — ANTINUCLEAR ANTIBODIES, IFA: ANA Titer 1: NEGATIVE

## 2022-08-07 LAB — METHYLMALONIC ACID, SERUM: Methylmalonic Acid: 135 nmol/L (ref 0–378)

## 2022-08-07 LAB — B12 AND FOLATE PANEL
Folate: 13.3 ng/mL (ref >3.0)
Vitamin B-12: 624 pg/mL (ref 232–1245)

## 2022-08-07 LAB — VITAMIN B6: Vitamin B6: 5.4 ug/L (ref 3.4–65.2)

## 2022-08-07 LAB — RHEUMATOID FACTOR: Rheumatoid fact SerPl-aCnc: <10 IU/mL (ref <14.0)

## 2022-09-01 ENCOUNTER — Telehealth: Payer: Self-pay | Admitting: Neurology

## 2022-09-01 LAB — INFORMED CONSENT NEEDED

## 2022-09-01 NOTE — Telephone Encounter (Signed)
error 

## 2022-10-04 ENCOUNTER — Ambulatory Visit (INDEPENDENT_AMBULATORY_CARE_PROVIDER_SITE_OTHER): Payer: Self-pay | Admitting: Neurology

## 2022-10-04 ENCOUNTER — Encounter: Payer: Medicare Other | Admitting: Neurology

## 2022-10-04 ENCOUNTER — Ambulatory Visit (INDEPENDENT_AMBULATORY_CARE_PROVIDER_SITE_OTHER): Payer: Medicare Other | Admitting: Neurology

## 2022-10-04 DIAGNOSIS — R200 Anesthesia of skin: Secondary | ICD-10-CM

## 2022-10-04 DIAGNOSIS — G8929 Other chronic pain: Secondary | ICD-10-CM | POA: Diagnosis not present

## 2022-10-04 DIAGNOSIS — E859 Amyloidosis, unspecified: Secondary | ICD-10-CM

## 2022-10-04 DIAGNOSIS — R202 Paresthesia of skin: Secondary | ICD-10-CM

## 2022-10-04 DIAGNOSIS — Z0289 Encounter for other administrative examinations: Secondary | ICD-10-CM

## 2022-10-04 DIAGNOSIS — M545 Low back pain, unspecified: Secondary | ICD-10-CM

## 2022-10-04 NOTE — Patient Instructions (Addendum)
-May consider daily alpha lipoic acid which is an antioxidant that may reduce free radical oxidative stress associated with diabetic polyneuropathy, existing evidence suggests that alpha lipoic acid significantly reduces stabbing, lancinating and burning pain and diabetic neuropathy with its onset of action as early as 1-2 weeks. 600mg  twice daily  Diabetic Neuropathy Diabetic neuropathy refers to nerve damage that is caused by diabetes. Over time, people with diabetes can develop nerve damage throughout the body. There are several types of diabetic neuropathy: Peripheral neuropathy. This is the most common type of diabetic neuropathy. It damages the nerves that carry signals between the spinal cord and other parts of the body (peripheral nerves). This usually affects nerves in the feet, legs, hands, and arms. Autonomic neuropathy. This type causes damage to nerves that control involuntary functions (autonomic nerves). Involuntary functions are functions of the body that you do not control. They include heartbeat, body temperature, blood pressure, urination, digestion, sweating, sexual function, or response to changes in blood glucose. Focal neuropathy. This type of nerve damage affects one area of the body, such as an arm, a leg, or the face. The injury may involve one nerve or a small group of nerves. Focal neuropathy can be painful and unpredictable. It occurs most often in older adults with diabetes. This often develops suddenly, but usually improves over time and does not cause long-term problems. Proximal neuropathy. This type of nerve damage affects the nerves of the thighs, hips, buttocks, or legs. It causes severe pain, weakness, and muscle death (atrophy), usually in the thigh muscles. It is more common among older men and people who have type 2 diabetes. The length of recovery time may vary. What are the causes? Peripheral, autonomic, and focal neuropathies are caused by diabetes that is not well  controlled with treatment. The cause of proximal neuropathy is not known, but it may be caused by inflammation related to uncontrolled blood glucose levels. What are the signs or symptoms? Peripheral neuropathy Peripheral neuropathy develops slowly over time. When the nerves of the feet and legs no longer work, you may experience: Burning, stabbing, or aching pain in the legs or feet. Pain or cramping in the legs or feet. Loss of feeling (numbness) and inability to feel pressure or pain in the feet. This can lead to: Thick calluses or sores on areas of constant pressure. Ulcers. Reduced ability to feel temperature changes. Foot deformities. Muscle weakness. Loss of balance or coordination. Autonomic neuropathy The symptoms of autonomic neuropathy vary depending on which nerves are affected. Symptoms may include: Problems with digestion, such as: Nausea or vomiting. Poor appetite. Bloating. Diarrhea or constipation. Trouble swallowing. Losing weight without trying to. Problems with the heart, blood, and lungs, such as: Dizziness, especially when standing up. Fainting. Shortness of breath. Irregular heartbeat. Bladder problems, such as: Trouble starting or stopping urination. Leaking urine. Trouble emptying the bladder. Urinary tract infections (UTIs). Problems with other body functions, such as: Sweat. You may sweat too much or too little. Temperature. You might get hot easily. Or, you might feel cold more than usual. Sexual function. Men may not be able to get or maintain an erection. Women may have vaginal dryness and difficulty with arousal. Focal neuropathy Symptoms affect only one area of the body. Common symptoms include: Numbness. Tingling. Burning pain. Prickling feeling. Very sensitive skin. Weakness. Inability to move (paralysis). Muscle twitching. Muscles getting smaller (wasting). Poor coordination. Double or blurred vision. Proximal neuropathy Sudden,  severe pain in the hip, thigh, or buttocks. Pain may  spread from the back into the legs (sciatica). Pain and numbness in the arms and legs. Tingling. Loss of bladder control or bowel control. Weakness and wasting of thigh muscles. Difficulty getting up from a seated position. Abdominal swelling. Unexplained weight loss. How is this diagnosed? Diagnosis varies depending on the type of neuropathy your health care provider suspects. Peripheral neuropathy Your health care provider will do a neurologic exam. This exam checks your reflexes, how you move, and what you can feel. You may have other tests, such as: Blood tests. Tests of the fluid that surrounds the spinal cord (lumbar puncture). CT scan. MRI. Checking the nerves that control muscles (electromyogram, or EMG). Checking how quickly signals pass through your nerves (nerve conduction study). Checking a small piece of a nerve using a microscope (biopsy). Autonomic neuropathy You may have tests, such as: Tests to measure your blood pressure and heart rate. You may be secured to an exam table that moves you from a lying position to an upright position (table tilt test). Breathing tests to check your lungs. Tests to check how food moves through the digestive system (gastric emptying tests). Blood, sweat, or urine tests. Ultrasound of your bladder. Spinal fluid tests. Focal neuropathy This condition may be diagnosed with: A neurologic exam. CT scan. MRI. EMG. Nerve conduction study. Proximal neuropathy There is no test to diagnose this type of neuropathy. You may have tests to rule out other possible causes of this type of neuropathy. Tests may include: X-rays of your spine and lumbar region. Lumbar puncture. MRI. How is this treated? The goal of treatment is to keep nerve damage from getting worse. Treatment may include: Following your diabetes management plan. This will help keep your blood glucose level and your A1C level  within your target range. This is the most important treatment. Using prescription pain medicine. Follow these instructions at home: Diabetes management Follow your diabetes management plan as told by your health care provider. Check your blood glucose levels. Keep your blood glucose in your target range. Have your A1C level checked at least two times a year, or as often as told. Take over the counter and prescription medicines only as told by your health care provider. This includes insulin and diabetes medicine.  Lifestyle  Do not use any products that contain nicotine or tobacco, such as cigarettes, e-cigarettes, and chewing tobacco. If you need help quitting, ask your health care provider. Be physically active every day. Include strength training and balance exercises. Follow a healthy meal plan. Work with your health care provider to manage your blood pressure. General instructions Ask your health care provider if the medicine prescribed to you requires you to avoid driving or using machinery. Check your skin and feet every day for cuts, bruises, redness, blisters, or sores. Keep all follow-up visits. This is important. Contact a health care provider if: You have burning, stabbing, or aching pain in your legs or feet. You are unable to feel pressure or pain in your feet. You develop problems with digestion, such as: Nausea. Vomiting. Bloating. Constipation. Diarrhea. Abdominal pain. You have difficulty with urination, such as: Inability to control when you urinate (incontinence). Inability to completely empty the bladder (retention). You feel as if your heart is racing (palpitations). You feel dizzy, weak, or faint when you stand up. Get help right away if: You cannot urinate. You have sudden weakness or loss of coordination. You have trouble speaking. You have pain or pressure in your chest. You have an irregular heartbeat.  You have sudden inability to move a part of  your body. These symptoms may represent a serious problem that is an emergency. Do not wait to see if the symptoms will go away. Get medical help right away. Call your local emergency services (911 in the [place] Do not drive yourself to the hospital. Summary Diabetic neuropathy is nerve damage that is caused by diabetes. It can cause numbness and pain in the arms, legs, digestive tract, heart, and other body systems. This condition is treated by keeping your blood glucose level and your A1C level within your target range. This can help prevent neuropathy from getting worse. Check your skin and feet every day for cuts, bruises, redness, blisters, or sores. Do not use any products that contain nicotine or tobacco, such as cigarettes, e-cigarettes, and chewing tobacco. This information is not intended to replace advice given to you by your health care provider. Make sure you discuss any questions you have with your health care provider. Document Revised: 08/27/2019 Document Reviewed: 08/27/2019 Elsevier Patient Education  2024 [employer].

## 2022-10-07 ENCOUNTER — Encounter: Payer: Self-pay | Admitting: Neurology

## 2022-10-07 NOTE — Progress Notes (Unsigned)
Full Name: [name] Anderson Gender: Male MRN #: [id number] Date of Birth: 12/02/1955    Visit Date: 10/04/2022 09:38 Age: 67 Years Examining Physician: Dr. Naomie Dean Referring Physician: Dr. Naomie Dean Height: 5 feet 10 inch      History: Here for EMG/NCS for numbnes in the left toe. Numbness started several months ago left big toe, feels cold, no burning, no tingling, no pain, feels cold also feels cold to touch. In the left toe (maybe the right toe). He gets a weird feeling in the legs, elevating them helps, nothine else associated with the toe as far as he knows, no inciting events to the numbness of the toe, right might be starting just a little as well. No weakness. He has back pain from being a firfighter, years of low back pain,no radicular symptoms. The tip of the great toe worse. No swelling. No rash. Nothing in the hands. MRi in 2018 without any stenosis at L5/S1. No personal or family history of autoimmune disease. No alcohol. Comes and goes. Not worse in bed. Little numb right now. Nothing makes it better, nothing known to make it worse. Happens daily, feels it all day long, sometimes a little worse. Has some knee pain. No neck pain. No other focal neurologic deficits, associated symptoms, inciting events or modifiable factor. He has been extensively worked up and hgba1c is now 6 which could be implicated.  Summary:  EMG/NCS performed on the bilateral lower extremities. All nerves and muscles (as indicated in the following tables) were within normal limits.     Conclusion: This is a normal study. No electrophysiologic evidence for polyneuropathy, mononeuropathy or radiculopathy. A small-fiber neuropathy could be present and still evade detection by this exam as could very mild radiculopathy. Recommend skin biopsy and MRI lumbar spine as clinically warranted.    [name] Anderson, I was thiking about your test. You have not had an MRI lumbar spine since 2018. L5/s1 radiculopathy can  also cause numbness in the feet and emg/ncs is not gopd at picking that up until it is severe. Can we check to see if you have nerve pinching in your low back that could cause numness in toe its entirely possible? It would be an  MRI lumbar spine. We can also perform a skin biopsy and make sure you have small-fiber neuropathy. Thoughts?  ------------------------------- Naomie Dean, M.D.  [facility] [address], [address] [place], [place] [address] Tel: [phone] Fax: [phone]  Verbal informed consent was obtained from the patient, patient was informed of potential risk of procedure, including bruising, bleeding, hematoma formation, infection, muscle weakness, muscle pain, numbness, among others.        MNC    Nerve / Sites Muscle Latency Ref. Amplitude Ref. Rel Amp Segments Distance Velocity Ref. Area    ms ms mV mV %  cm m/s m/s mVms  L Peroneal - EDB     Ankle EDB 5.5 ?6.5 7.7 ?2.0 100 Ankle - EDB 9   26.3     Fib head EDB 12.9  7.1  92.1 Fib head - Ankle 31 41 ?44 26.5     Pop fossa EDB 15.3  6.9  97.9 Pop fossa - Fib head 12 51 ?44 26.7         Pop fossa - Ankle      R Peroneal - EDB     Ankle EDB 5.3 ?6.5 7.4 ?2.0 100 Ankle - EDB 9   27.5  Fib head EDB 12.2  6.5  87.7 Fib head - Ankle 31 45 ?44 26.1     Pop fossa EDB 14.8  6.0  92.5 Pop fossa - Fib head 12 46 ?44 25.3         Pop fossa - Ankle      L Tibial - AH     Ankle AH 4.2 ?5.8 4.0 ?4.0 100 Ankle - AH 9   12.0     Pop fossa AH 14.9  3.0  75.1 Pop fossa - Ankle 44 41 ?41 9.9  R Tibial - AH     Ankle AH 4.5 ?5.8 4.8 ?4.0 100 Ankle - AH 9   12.8     Pop fossa AH 15.6  3.2  67.4 Pop fossa - Ankle 45 41 ?41 13.4             SNC    Nerve / Sites Rec. Site Peak Lat Ref.  Amp Ref. Segments Distance Peak Diff    ms ms V V  cm ms  L Sural - Ankle (Calf)     Calf Ankle 3.9 ?4.4 11 ?6 Calf - Ankle 14   R Sural - Ankle (Calf)     Calf Ankle 3.4 ?4.4 13 ?6 Calf - Ankle 14   L Superficial  peroneal - Ankle     Lat leg Ankle 3.8 ?4.4 13 ?6 Lat leg - Ankle 9      2 Ankle 3.7  7  2  - Lat leg    R Superficial peroneal - Ankle     Lat leg Ankle 4.3 ?4.4 6 ?6 Lat leg - Ankle 14              F  Wave    Nerve F Lat Ref.   ms ms  L Tibial - AH 55.7 ?56.0       H Reflex    Nerve H Lat Lat Hmax   ms ms   Left Right Ref. Left Right Ref.  Tibial - Soleus 33.0 32.7 ?35.0 22.2 20.9 ?35.0         EMG Summary Table    Spontaneous MUAP Recruitment  Muscle IA Fib PSW Fasc Other Amp Dur. Poly Pattern  L. Vastus medialis Normal None None None _______ Normal Normal Normal Normal  L. Tibialis anterior Normal None None None _______ Normal Normal Normal Normal  L. Gastrocnemius (Medial head) Normal None None None _______ Normal Normal Normal Normal  L. Extensor hallucis longus Normal None None None _______ Normal Normal Normal Normal  L. Abductor hallucis Normal None None None _______ Normal Normal Normal Normal

## 2022-10-08 NOTE — Progress Notes (Addendum)
History: Here for EMG/NCS for numbnes in the left toe. Numbness started several months ago left big toe, feels cold, no burning, no tingling, no pain, feels cold also feels cold to touch. In the left toe (maybe the right toe). He gets a weird feeling in the legs, elevating them helps, nothine else associated with the toe as far as he knows, no inciting events to the numbness of the toe, right might be starting just a little as well. No weakness. He has back pain from being a firfighter, years of low back pain,radicular symptoms. The tip of the great toe worse. No swelling. No rash. Nothing in the hands. MRi in 2018 without any stenosis at L5/S1. No personal or family history of autoimmune disease. No alcohol. Comes and goes. Not worse in bed. Little numb right now. Nothing makes it better, nothing known to make it worse. Happens daily, feels it all day long, sometimes a little worse. Has some knee pain. No neck pain. No other focal neurologic deficits, associated symptoms, inciting events or modifiable factor. He has been extensively worked up and hgba1c is now 6 which could be implicated in symptoms.  Patient has had extensive workup for causes of peripheral neuropathy including labwork(below) and vascular evaluation that only revealed pre-diabetes hgba1c 6.0 unknown how long pre-diabetic.  We discussed small-fiber neuropathy and I gave him literature from [facility] on prediabetes and peripheral polyneuropathy. Even though nerve conduction studies were normal, a small fiber neuropathy could evade detection by this exam as can a very mild radiculopathy.  We discussed that even prediabetes can cause the start of neuropathy in the feet.  I recommended that he see his primary care for pre-diabetes treatment and also try alpha lipoic acid. Also given his long history of LBP, an MRI of the lumbar spine is warranted as may be small-fiber skin biopsy if MRI is unrevealing.    Orders Placed This Encounter   Procedures   MR LUMBAR SPINE WO CONTRAST    Recent Results (from the past 2160 hour(s))  Hemoglobin A1c     Status: Abnormal   Collection Time: 07/24/22 10:23 AM  Result Value Ref Range   Hgb A1c MFr Bld 6.0 (H) 4.8 - 5.6 %    Comment:          Prediabetes: 5.7 - 6.4          Diabetes: >6.4          Glycemic control for adults with diabetes: <7.0    Est. average glucose Bld gHb Est-mCnc 126 mg/dL  ANA, IFA (with reflex)     Status: None   Collection Time: 07/24/22 10:23 AM  Result Value Ref Range   ANA Titer 1 Negative     Comment:                                      Negative   <1:80                                      Borderline  1:80                                      Positive   >1:80 ICAP nomenclature: AC-0 For more information  about Hep-2 cell patterns use ANApatterns.org, the official website for the International Consensus on Antinuclear Antibody (ANA) Patterns (ICAP).   Sjogren's syndrome antibods(ssa + ssb)     Status: None   Collection Time: 07/24/22 10:23 AM  Result Value Ref Range   ENA SSA (RO) Ab 0.2 0.0 - 0.9 AI   ENA SSB (LA) Ab <0.2 0.0 - 0.9 AI  Rheumatoid factor     Status: None   Collection Time: 07/24/22 10:23 AM  Result Value Ref Range   Rheumatoid fact SerPl-aCnc <10.0 <14.0 IU/mL  B12 and Folate Panel     Status: None   Collection Time: 07/24/22 10:23 AM  Result Value Ref Range   Vitamin B-12 624 232 - 1,245 pg/mL   Folate 13.3 >3.0 ng/mL    Comment: A serum folate concentration of less than 3.1 ng/mL is considered to represent clinical deficiency.   Methylmalonic acid, serum     Status: None   Collection Time: 07/24/22 10:23 AM  Result Value Ref Range   Methylmalonic Acid 135 0 - 378 nmol/L  Vitamin B1     Status: None   Collection Time: 07/24/22 10:23 AM  Result Value Ref Range   Thiamine 146.4 66.5 - 200.0 nmol/L  Vitamin B6     Status: None   Collection Time: 07/24/22 10:23 AM  Result Value Ref Range   Vitamin B6 5.4 3.4 - 65.2  ug/L    Comment:                              Deficiency:         <3.4                              Marginal:      3.4 - 5.1                              Adequate:           >5.1   GeneSeq PLUS, TTR     Status: None   Collection Time: 07/24/22 10:23 AM  Result Value Ref Range   Test Detail Comment     Comment: Variants of Uncertain Significance (VUS) Included     Ethnicity Comment     Comment: Not Provided     Specimen Type Comment     Comment: Whole Blood     Indication Comment     Comment: Suspected diagnosis     Result: Comment     Comment: NEGATIVE     Interpretation Comment     Comment: Negative Results  Disorders (Gene)            Result      Interpretation  Transthyretin-related       NEGATIVE    No pathogenic amyloidosis (TTR)                       variants or [id number]_[id number]                             variants of  uncertain                                         significance                                         were detected                                         for the genes                                         analyzed. This                                         result does not                                         support a                                         diagnosis of, or                                         a predisposition                                         to, disorders                                         related to the                                         gene tested.     Recommendations Comment     Comment: Genetic counseling is recommended to discuss the potential clinical and/or reproductive implications of positive results, as well as recommendations for testing family members. Genetic counseling services are available. To access [facility] please visit [email or web address] or call [phone]).      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 Signs and symptoms include progressive peripheral sensorimotor and autonomic neuropathy, cardiomyopathy, digestive dysfunction, vitreous opacities, cataracts, glaucoma, and CNS abnormalities. Severe complications, such as stroke, brain bleeds, abnormal movement and mental decay, are associated with a form of amyloidosis that affects the central  nervous system. Individuals with a cardiac-specific form may develop progressive heart failure due to arrhythmia, an enlarged heart, or orthostatic hypertension. Treatment involves management of symptoms and may include surgical intervention, implantation of a cardioverter defibrillator, and use of TTR tetramer stabilizers or gene-silencing therapies. Laury Deep, PMID: [id number] Crutch, PMID: [id number] Duel, PMID: [id number]).      Comments Comment     Comment: This interpretation is based on the clinical information provided and the current understanding of the molecular genetics of the disorder(s) tested.     Methods/Limitations Comment     Comment: Next-generation Sequencing: Genomic regions of interest are selected using the [employer](R) hybridization capture method and sequenced via the Illumina(R) next generation sequencing platform. Sequencing reads are aligned with the human genome reference GRCh37/hg19 build. Regions of interest include coding exons, intron/exon junctions (typically +/- 20 nucleotides) and additional genomic regions with known significant pathogenic variants. Analytical sensitivity at 30X coverage is estimated to be >99% for single nucleotide variants, >99% for insertions/deletions less than six base pairs and >96% for insertions/deletions between six  and forty-five base pairs. Copy number variants (CNVs) are assessed and reported for the DMD gene only. Confirmatory testing by orthogonal technologies includes Sanger sequencing, and MLPA analysis.  If the following genes are included in this test, these analysis restrictions are applied: APOB includes only 556bp of exon 26; MED12 in cludes only the c.3020A>G (p.N1007S) variant; TBX1 excludes [id number] in exon 3; TGFBR1 excludes exon 1; TTN excludes exons 154 and 155.  Reported variants: Pathogenic and likely pathogenic variants are reported for all tests. Benign and likely benign variants are typically not reported. Variants of uncertain significance are reported when included in the test specification. Variants are specified using the numbering and nomenclature recommended by the [employer] Variation Society (HGVS, ViralSquad.com.cy). Variant classification and confirmation are consistent with ACMG standards and guidelines Peggye Pitt, [id number] Garibaldi, ZDGU:[id number]). Detailed variant classification information and reevaluation are available upon request.  Limitations: Technologies used do not detect germline mosaicism and do not rule out the presence of large chromosomal aberrations including rearrangements and gene fusions, or variants in regions or genes not included in  this test, or possible inter/intragenic interactions between variants, or repeat expansions. Variant classification and/or interpretation may change over time if more information becomes available. False positive or false negative results may occur for reasons that include: rare genetic variants, sex chromosome abnormalities, pseudogene interference, blood transfusions, bone marrow transplantation, somatic or tissue-specific mosaicism, mislabeled samples, or erroneous representation of family relationships.  This test was developed and its performance characteristics determined by  [employer]. It has not been cleared or approved by the Food and Drug Administration.  [facility] [employer] is a subsidiary of [employer], using the brand [employer]. Inheritest(R) and [employer]) are registered service marks of [employer].     References Comment     Comment: Andreas Ohm, Bean [facility], Rosalio Macadamia et al. Next-generation sequencing for constitutional variants in the clinical laboratory, 2021 revision: a technical standard of the [employer] of [employer] and Genomics (ACMG). Genet Med 23, 1399 (2021). PMID: 59563875  Trude Mcburney et al. Standards and guidelines for the interpretation of sequence variants: a joint consensus recommendation of the [employer] of Medical Genetics and Genomics and the Association for Molecular Pathology. Genet Med 17, 405 (2015). PMID: 64332951     Director Review/Release Comment     Comment: Component Type  Performed At         Laboratory                                          Director  Technical           [facility]     Marilynn Latino, PhD, component,          Laboratories, [place],   [facility] processing          [address],                     [place], [place],                     [address]  Technical           [facility]     Marilynn Latino, PhD, component,          Laboratories, [place],   [facility] analysis            [address],                     [place], [place],                     [address]  Professional        [facility]     Marilynn Latino, PhD, component           Laboratories, [place],   [facility]                     [address]                     [place], [place],                     [address]  Electronically released by Jefm Miles, PhD, [facility]    Multiple Myeloma Panel (SPEP&IFE w/QIG)     Status: None   Collection Time:  07/24/22 10:23 AM  Result Value Ref Range   IgG (Immunoglobin G), Serum 1,499 603 - 1,613 mg/dL   IgA/Immunoglobulin A, Serum 259 61 - 437 mg/dL   IgM (Immunoglobulin M), Srm 63 20 - 172 mg/dL   Total Protein 7.5 6.0 - 8.5 g/dL   Albumin SerPl Elph-Mcnc 4.1 2.9 - 4.4 g/dL   Alpha 1 0.2 0.0 - 0.4 g/dL   Alpha2 Glob SerPl Elph-Mcnc 0.7 0.4 - 1.0 g/dL   B-Globulin SerPl Elph-Mcnc 1.1 0.7 - 1.3 g/dL   Gamma Glob SerPl Elph-Mcnc 1.4 0.4 - 1.8 g/dL   M Protein SerPl Elph-Mcnc Not Observed Not Observed g/dL   Globulin, Total 3.4 2.2 - 3.9 g/dL   Albumin/Glob SerPl 1.3 0.7 - 1.7   IFE 1 Comment     Comment: The immunofixation pattern appears unremarkable. Evidence of monoclonal protein is not apparent.    Please Note Comment     Comment: Protein electrophoresis scan will follow via computer, mail, or courier delivery.  Informed Consent Needed     Status: None   Collection Time: 07/24/22 10:23 AM  Result Value Ref Range   Informed Consent Needed Comment     Comment: Please Fax back to [phone]. Many states require laboratories to have documentation that the appropriate health care provider has obtained informed consent from patients before the laboratory conducts genetic testing. Informed consent includes the patient understanding the purpose of the test, how the test is performed, the reliability of the test, alternatives to testing, implications of test results, and options on how to instruct the laboratory to store, use or dispose of the sample when testing is complete.    [facility] did not receive any documentation of informed consent for above mentioned patient and ordered tests. Please check the statement applicable to this patient and sign below so that [facility] may release the results for this patient. []  I authorize and confirm patient consent for the above mentioned genetic test(s). []  I have provided appropriate informed consent for the above    mentioned test(s) and  documentation of this consent is    maintained in the  patient record. Health care provider signature_________________Date_________ Printed name________________________________________________               Fax back to [facility] at [phone]                   [facility]                       [phone]    I spent 20 minutes of face-to-face and non-face-to-face time with patient on the  1. Numbness and tingling of both feet   2. Chronic bilateral low back pain, unspecified whether sciatica present   3. Numbness and tingling of foot    diagnosis.  This included previsit chart review, lab review, study review, order entry, electronic health record documentation, patient education on the different diagnostic and therapeutic options, counseling and coordination of care, risks and benefits of management, compliance, or risk factor reduction. This does not include time spent on emg/ncs.

## 2022-10-09 ENCOUNTER — Telehealth: Payer: Self-pay | Admitting: Neurology

## 2022-10-09 NOTE — Telephone Encounter (Signed)
MR lumbar w/wo sent to GI for scheduling [phone].  No auth required for Medicare A&B.

## 2022-10-13 ENCOUNTER — Ambulatory Visit
Admission: RE | Admit: 2022-10-13 | Discharge: 2022-10-13 | Disposition: A | Discharge status: Home / Self Care | Payer: Medicare Other | Source: Ambulatory Visit | Attending: Neurology | Admitting: Neurology

## 2022-10-13 DIAGNOSIS — R200 Anesthesia of skin: Secondary | ICD-10-CM

## 2022-10-13 DIAGNOSIS — G8929 Other chronic pain: Secondary | ICD-10-CM

## 2022-10-13 DIAGNOSIS — M545 Low back pain, unspecified: Secondary | ICD-10-CM

## 2022-10-13 DIAGNOSIS — R202 Paresthesia of skin: Secondary | ICD-10-CM

## 2022-11-06 ENCOUNTER — Other Ambulatory Visit: Payer: Self-pay | Admitting: Cardiology

## 2022-11-07 HISTORY — PX: MELANOMA EXCISION: SHX5266

## 2022-12-10 ENCOUNTER — Encounter: Payer: Self-pay | Admitting: Nurse Practitioner

## 2022-12-10 ENCOUNTER — Ambulatory Visit: Payer: Medicare Other | Attending: Nurse Practitioner | Admitting: Nurse Practitioner

## 2022-12-10 ENCOUNTER — Other Ambulatory Visit: Payer: Self-pay | Admitting: Urology

## 2022-12-10 ENCOUNTER — Telehealth: Payer: Self-pay | Admitting: Cardiology

## 2022-12-10 VITALS — BP 114/80 | HR 79 | Ht 71.0 in | Wt 218.8 lb

## 2022-12-10 DIAGNOSIS — I77810 Thoracic aortic ectasia: Secondary | ICD-10-CM | POA: Diagnosis present

## 2022-12-10 DIAGNOSIS — I10 Essential (primary) hypertension: Secondary | ICD-10-CM | POA: Diagnosis present

## 2022-12-10 DIAGNOSIS — I351 Nonrheumatic aortic (valve) insufficiency: Secondary | ICD-10-CM | POA: Insufficient documentation

## 2022-12-10 DIAGNOSIS — E785 Hyperlipidemia, unspecified: Secondary | ICD-10-CM | POA: Diagnosis present

## 2022-12-10 DIAGNOSIS — Z01810 Encounter for preprocedural cardiovascular examination: Secondary | ICD-10-CM | POA: Diagnosis present

## 2022-12-10 NOTE — Telephone Encounter (Signed)
   Pre-operative Risk Assessment    Patient Name: [name] Anderson  DOB: 1955/10/04 MRN: [id number]      Request for Surgical Clearance    Procedure:   Left Extracorporeal Shock Wave Lithotripsy  Date of Surgery:  Clearance 12/24/22                                 Surgeon:  Dr. Heloise Purpura Surgeon's Group or Practice Name:  [facility] Phone number:  [phone] Fax number:  [phone]   Type of Clearance Requested:   - Medical  - Pharmacy:  Hold Aspirin     Type of Anesthesia:  Local    Additional requests/questions:   Pt must have an in office appt and not TELE appt due to him needing to have an EKG done before he can have this procedure done.  Barbette Reichmann   12/10/2022, 10:11 AM

## 2022-12-10 NOTE — Telephone Encounter (Signed)
   Name: [name] Anderson  DOB: Mar 28, 1956  MRN: [id number]  Primary Cardiologist: Olga Millers, MD  Chart reviewed as part of pre-operative protocol coverage. Because of [name]'s past medical history and time since last visit, he will require a follow-up in-office visit in order to better assess preoperative cardiovascular risk.  Pre-op covering staff: - Please schedule appointment and call patient to inform them. If patient already had an upcoming appointment within acceptable timeframe, please add "pre-op clearance" to the appointment notes so provider is aware. - Please contact requesting surgeon's office via preferred method (i.e, phone, fax) to inform them of need for appointment prior to surgery.  He can hold his aspirin as long as he is not having heart related symptoms at the time of an office visit.  Sharlene Dory, PA-C  12/10/2022, 10:19 AM

## 2022-12-10 NOTE — Patient Instructions (Addendum)
Medication Instructions:  Your physician recommends that you continue on your current medications as directed. Please refer to the Current Medication list given to you today.  *If you need a refill on your cardiac medications before your next appointment, please call your pharmacy*   Lab Work: NONE ordered at this time of appointment     Testing/Procedures: Your physician has requested that you have an echocardiogram. Echocardiography is a painless test that uses sound waves to create images of your heart. It provides your doctor with information about the size and shape of your heart and how well your heart's chambers and valves are working. This procedure takes approximately one hour. There are no restrictions for this procedure. Please do NOT wear cologne, perfume, aftershave, or lotions (deodorant is allowed). Please arrive 15 minutes prior to your appointment time.     Follow-Up: At [facility], you and your health needs are our priority.  As part of our continuing mission to provide you with exceptional heart care, we have created designated Provider Care Teams.  These Care Teams include your primary Cardiologist (physician) and Advanced Practice Providers (APPs -  Physician Assistants and Nurse Practitioners) who all work together to provide you with the care you need, when you need it.  We recommend signing up for the patient portal called "MyChart".  Sign up information is provided on this After Visit Summary.  MyChart is used to connect with patients for Virtual Visits (Telemedicine).  Patients are able to view lab/test results, encounter notes, upcoming appointments, etc.  Non-urgent messages can be sent to your provider as well.   To learn more about what you can do with MyChart, go to [email or web address].    Your next appointment:   6 month(s)  Provider:   Olga Millers, MD

## 2022-12-10 NOTE — Progress Notes (Signed)
Office Visit    Patient Name: [name] Anderson Date of Encounter: 12/10/2022  Primary Care Provider:  Tally Joe, MD Primary Cardiologist:  Olga Millers, MD  Chief Complaint    67 year old male with a history of aortic regurgitation s/p aortic valve replacement, aortic root dilation, hypertension, and hyperlipidemia who presents for follow-up related to aortic regurgitation and for preoperative cardiac evaluation.  Past Medical History    Past Medical History:  Diagnosis Date   Allergy    Anemia    Barrett's esophagus - short segment 03/13/2017   Basal cell carcinoma 02/17/1998   Left Post Auriular (tx p bx)   BCC (basal cell carcinoma of skin) 01/13/2002   Infra Tip Nose (MOH's)   BCC (basal cell carcinoma of skin) 03/28/2006   Right Neck (curet and excision)   BCC (basal cell carcinoma of skin) 03/28/2006   Right Ear Rim (curet, excision, 5FU)   BCC (basal cell carcinoma of skin) 04/28/2012   Right Ear Rim (MOH's)   BCC (basal cell carcinoma of skin) 09/09/2012   Right Eyebrow (curet and 5FU)   BCC (basal cell carcinoma of skin) 09/09/2012   Right Back Sup (tx p bx)   BCC (basal cell carcinoma of skin) 09/09/2012   Right Back Inf (tx p bx)   Cancer (HCC) 09/11/2013   testicular   Complication of anesthesia    hard to wake  - 20 years ago   Diverticulitis    ED (erectile dysfunction)    Endocarditis    /notes 03/29/2017   H/O aneurysm 03/05/2017   High cholesterol    History of basal cell carcinoma excision    02/2002   --  NOSE  &   2013  RIGHT EAR   History of kidney stones    Hyperlipidemia    Hypertension    Mild obstructive sleep apnea    PER STUDY 01-18-2005--  NO CPAP OR MOUTH GUARD   Moderate aortic valve insufficiency    Multiple pulmonary nodules    PER CT--  PROBABLE  GRANULOMATOUS - patient is not aware of this   Personal history of colonic polyps    TUBULAR ADENOMA--    Seasonal allergies    Superficial basal cell carcinoma (BCC) 03/24/2014    Left Lower Back   Superficial basal cell carcinoma (BCC) 01/27/2018   Above Left Eyebrow (curet and 5FU)   Superficial basal cell carcinoma (BCC) 01/27/2018   Right Upper Abdomen (curet and 5FU)   Superficial nodular basal cell carcinoma (BCC) 01/27/2018   Left Scapula (curet and 5FU)   Superficial nodular basal cell carcinoma (BCC) 01/27/2018   Right Upper Thigh (curet and 5FU)   Superficial nodular infiltrative basal cell carcinoma (BCC) 01/27/2018   Right Lower Back Mid (curet and 5FU)   Testicular cancer [facility])    Testicular mass    RIGHT   Past Surgical History:  Procedure Laterality Date   AORTIC VALVE REPLACEMENT N/A 06/05/2017   Procedure: AORTIC VALVE REPLACEMENT (AVR) using 25mm Magna Ease valve;  Surgeon: Delight Ovens, MD;  Location: [facility] OR;  Service: Open Heart Surgery;  Laterality: N/A;   COLONOSCOPY  01/24/06, 07/10/11   COLONOSCOPY WITH ESOPHAGOGASTRODUODENOSCOPY (EGD)     EXTRACORPOREAL SHOCK WAVE LITHOTRIPSY     EXTRACORPOREAL SHOCK WAVE LITHOTRIPSY Left 07/04/2020   Procedure: EXTRACORPOREAL SHOCK WAVE LITHOTRIPSY (ESWL);  Surgeon: Crista Elliot, MD;  Location: [facility];  Service: Urology;  Laterality: Left;   HERNIA REPAIR  MOHS SURGERY  02/2002  &  2013   TIP OF NOSE-///     RIGHT EAR   ORCHIECTOMY Right 09/11/2013   Procedure:  RIGHT ORCHIECTOMY;  Surgeon: Chelsea Aus, MD;  Location: [facility];  Service: Urology;  Laterality: Right;   RIGHT/LEFT HEART CATH AND CORONARY ANGIOGRAPHY N/A 04/18/2017   Procedure: RIGHT/LEFT HEART CATH AND CORONARY ANGIOGRAPHY;  Surgeon: Tonny Bollman, MD;  Location: [facility] INVASIVE CV LAB;  Service: Cardiovascular;  Laterality: N/A;   SCROTAL EXPLORATION Right 09/11/2013   Procedure: RIGHT INGUINAL EXPLORATION;  Surgeon: Chelsea Aus, MD;  Location: [facility];  Service: Urology;  Laterality: Right;   TEE WITHOUT CARDIOVERSION N/A 03/29/2017   Procedure:  TRANSESOPHAGEAL ECHOCARDIOGRAM (TEE);  Surgeon: Lewayne Bunting, MD;  Location: [facility] ENDOSCOPY;  Service: Cardiovascular;  Laterality: N/A;   TEE WITHOUT CARDIOVERSION N/A 06/05/2017   Procedure: TRANSESOPHAGEAL ECHOCARDIOGRAM (TEE);  Surgeon: Delight Ovens, MD;  Location[facility] OR;  Service: Open Heart Surgery;  Laterality: N/A;   TRANSTHORACIC ECHOCARDIOGRAM  02-25-2013   DR CRENSHAW   NORMAL LVSF/  EF 55-60%/   GRADE 2 DIASTOLIC DYSFUNCTION/  ECCENTRIC MODERATE AORTIC INSUFFICIENCY WITHOUT STENOSIS/  MILD DILATED AORTIC ROOT/ TRIVIAL MR, TR,  & PR   UMBILICAL HERNIA REPAIR  06-08-2009   UMBILICAL HERNIA REPAIR  05/22/2018   WITH MESH   UMBILICAL HERNIA REPAIR N/A 05/22/2018   Procedure: LAPAROSCOPIC REPAIR OF RECURRENT UMBILICAL HERNIA WITH MESH;  Surgeon: Manus Rudd, MD;  Location: [facility] OR;  Service: General;  Laterality: N/A;   URETEROSCOPIC LASER LITHOTRIPSY STONE EXTRACTION  2007    Allergies  Allergies  Allergen Reactions   Atorvastatin Other (See Comments)     Labs/Other Studies Reviewed    The following studies were reviewed today:  Cardiac Studies & Procedures   CARDIAC CATHETERIZATION  CARDIAC CATHETERIZATION 04/18/2017  Narrative 1. Widely patent coronary arteries (right dominant) 2. Normal right and left heart pressures  Findings Coronary Findings Diagnostic  Dominance: Right  Left Main Vessel is angiographically normal.  Left Anterior Descending Vessel is angiographically normal.  First Diagonal Branch The LAD is patent to the LV apex without obstructive disease.  Left Circumflex Large vessel, angiographically normal.  Second Obtuse Marginal Branch Vessel is angiographically normal.  Right Coronary Artery Vessel is angiographically normal. Large, dominant vessel, angiographically normal  Intervention  No interventions have been documented.     ECHOCARDIOGRAM  ECHOCARDIOGRAM COMPLETE 07/30/2017  Narrative *[facility]* [address] [place], [place] [address] [phone]  ------------------------------------------------------------------- Echocardiography  Patient:    Rexall, Maybank MR #:       [id number] Study Date: 07/30/2017 Gender:     M Age:        61 Height:     180.3 cm Weight:     92.1 kg BSA:        2.17 m^2 Pt. Status: Room:  ORDERING     Arlys John Crenshaw REFERRING    Olga Millers SONOGRAPHER  Randa Evens, Will PERFORMING   [facility], Outpatient ATTENDING    Chilton Si, MD  cc:  ------------------------------------------------------------------- LV EF: 50% -   55%  ------------------------------------------------------------------- Indications:      (Z95.2).  ------------------------------------------------------------------- History:   PMH:  S/p bioprosthetic AVR (25mm Magna Ease). Dilated aortic root.. Acquired from the patient and from the patient&'s chart.  Risk factors:  Former tobacco use. Dyslipidemia.  ------------------------------------------------------------------- Study Conclusions  - Left ventricle: The cavity size was normal. There was moderate concentric  hypertrophy. Systolic function was normal. The estimated ejection fraction was in the range of 50% to 55%. Mild hypokinesis of the anteroseptal, inferior, and inferoseptal myocardium. Doppler parameters are consistent with abnormal left ventricular relaxation (grade 1 diastolic dysfunction). Doppler parameters are consistent with indeterminate ventricular filling pressure. - Aortic valve: A 25mm Edwards Magna Ease bioprosthesis was present and functioning properly. There was no regurgitation. Peak velocity (S): 238 cm/s. Mean gradient (S): 13 mm Hg. - Aorta: Ascending aortic diameter: 39 mm (S). - Ascending aorta: The ascending aorta was mildly dilated. - Mitral valve: There was mild regurgitation. - Right ventricle: The cavity size was normal. Wall thickness was normal. Systolic function was normal. -  Tricuspid valve: There was mild regurgitation. - Pulmonary arteries: Systolic pressure was within the normal range. PA peak pressure: 20 mm Hg (S).  ------------------------------------------------------------------- Study data:  Comparison was made to the study of 03/19/2017.  Study status:  Routine.  Procedure:  The patient reported no pain pre or post test. Transthoracic echocardiography for left ventricular function evaluation and for assessment of valvular function. Image quality was adequate.  Study completion:  There were no complications.          Echocardiography.  M-mode, complete 2D, spectral Doppler, and color Doppler.  Birthdate:  Patient birthdate: 07/24/1955.  Age:  Patient is 67 yr old.  Sex:  Gender: male.    BMI: 28.3 kg/m^2.  Blood pressure:     137/99  Patient status:  Outpatient.  Study date:  Study date: 07/30/2017. Study time: 08:49 AM.  Location:  [facility]  -------------------------------------------------------------------  ------------------------------------------------------------------- Left ventricle:  The cavity size was normal. There was moderate concentric hypertrophy. Systolic function was normal. The estimated ejection fraction was in the range of 50% to 55%.  Regional wall motion abnormalities:   Mild hypokinesis of the anteroseptal, inferior, and inferoseptal myocardium. Doppler parameters are consistent with abnormal left ventricular relaxation (grade 1 diastolic dysfunction). Doppler parameters are consistent with indeterminate ventricular filling pressure.  ------------------------------------------------------------------- Aortic valve:  A 25mm Edwards Magna Ease bioprosthesis was present and functioning properly. Mobility was not restricted.  Doppler: Transvalvular velocity was minimally increased. There was no stenosis. There was no regurgitation.    VTI ratio of LVOT to aortic valve: 0.36. Valve area (VTI): 1.49 cm^2. Indexed  valve area (VTI): 0.69 cm^2/m^2. Peak velocity ratio of LVOT to aortic valve: 0.36. Valve area (Vmax): 1.49 cm^2. Indexed valve area (Vmax): 0.69 cm^2/m^2. Mean velocity ratio of LVOT to aortic valve: 0.36. Valve area (Vmean): 1.5 cm^2. Indexed valve area (Vmean): 0.69 cm^2/m^2. Mean gradient (S): 13 mm Hg. Peak gradient (S): 23 mm Hg.  ------------------------------------------------------------------- Aorta:  Aortic root: The aortic root was normal in size. Ascending aorta: The ascending aorta was mildly dilated.  ------------------------------------------------------------------- Mitral valve:   Structurally normal valve.   Mobility was not restricted.  Doppler:  Transvalvular velocity was within the normal range. There was no evidence for stenosis. There was mild regurgitation.  ------------------------------------------------------------------- Left atrium:  The atrium was normal in size.  ------------------------------------------------------------------- Right ventricle:  The cavity size was normal. Wall thickness was normal. Systolic function was normal.  ------------------------------------------------------------------- Pulmonic valve:    Structurally normal valve.   Cusp separation was normal.  Doppler:  Transvalvular velocity was within the normal range. There was no evidence for stenosis. There was no regurgitation.  ------------------------------------------------------------------- Tricuspid valve:   Structurally normal valve.    Doppler: Transvalvular velocity was within the normal range. There was mild regurgitation.  -------------------------------------------------------------------  Pulmonary artery:   The main pulmonary artery was normal-sized. Systolic pressure was within the normal range.  ------------------------------------------------------------------- Right atrium:  The atrium was normal in  size.  ------------------------------------------------------------------- Pericardium:  There was no pericardial effusion.  ------------------------------------------------------------------- Systemic veins: Inferior vena cava: The vessel was normal in size. The respirophasic diameter changes were in the normal range (>= 50%), consistent with normal central venous pressure.  ------------------------------------------------------------------- Measurements  Left ventricle                            Value          Reference LV ID, ED, PLAX chordal           (L)     36.9  mm       43 - 52 LV ID, ES, PLAX chordal                   27.7  mm       23 - 38 LV fx shortening, PLAX chordal    (L)     25    %        >=29 LV PW thickness, ED                       14.8  mm       --------- IVS/LV PW ratio, ED                       0.97           <=1.3 Stroke volume, 2D                         71    ml       --------- Stroke volume/bsa, 2D                     33    ml/m^2   --------- LV ejection fraction, 1-p A4C             50    %        --------- LV end-diastolic volume, 2-p              88    ml       --------- LV end-systolic volume, 2-p               48    ml       --------- LV ejection fraction, 2-p                 45    %        --------- Stroke volume, 2-p                        40    ml       --------- LV end-diastolic volume/bsa, 2-p          41    ml/m^2   --------- LV end-systolic volume/bsa, 2-p           22    ml/m^2   --------- Stroke volume/bsa, 2-p                    18.5  ml/m^2   --------- LV e&', lateral  8.38  cm/s     --------- LV E/e&', lateral                          5.18           --------- LV e&', medial                             4.09  cm/s     --------- LV E/e&', medial                           10.61          --------- LV e&', average                            6.24  cm/s     --------- LV E/e&', average                          6.96            ---------  Ventricular septum                        Value          Reference IVS thickness, ED                         14.31 mm       ---------  LVOT                                      Value          Reference LVOT ID, S                                23    mm       --------- LVOT area                                 4.15  cm^2     --------- LVOT ID                                   23    mm       --------- LVOT peak velocity, S                     85.7  cm/s     --------- LVOT mean velocity, S                     60.8  cm/s     --------- LVOT VTI, S                               17.1  cm       --------- LVOT peak gradient, S                     3  mm Hg    --------- Stroke volume (SV), LVOT DP               71    ml       --------- Stroke index (SV/bsa), LVOT DP            32.8  ml/m^2   ---------  Aortic valve                              Value          Reference Aortic valve peak velocity, S             238   cm/s     --------- Aortic valve mean velocity, S             168   cm/s     --------- Aortic valve VTI, S                       47.6  cm       --------- Aortic mean gradient, S                   13    mm Hg    --------- Aortic peak gradient, S                   23    mm Hg    --------- VTI ratio, LVOT/AV                        0.36           --------- Aortic valve area, VTI                    1.49  cm^2     --------- Aortic valve area/bsa, VTI                0.69  cm^2/m^2 --------- Velocity ratio, peak, LVOT/AV             0.36           --------- Aortic valve area, peak velocity          1.49  cm^2     --------- Aortic valve area/bsa, peak               0.69  cm^2/m^2 --------- velocity Velocity ratio, mean, LVOT/AV             0.36           --------- Aortic valve area, mean velocity          1.5   cm^2     --------- Aortic valve area/bsa, mean               0.69  cm^2/m^2 --------- velocity  Aorta                                     Value           Reference Aortic root ID, ED                        39    mm       --------- Ascending aorta ID, A-P, S  39    mm       ---------  Left atrium                               Value          Reference LA ID, A-P, ES                            30    mm       --------- LA ID/bsa, A-P                            1.39  cm/m^2   <=2.2 LA volume, S                              47    ml       --------- LA volume/bsa, S                          21.7  ml/m^2   --------- LA volume, ES, 1-p A4C                    36    ml       --------- LA volume/bsa, ES, 1-p A4C                16.6  ml/m^2   --------- LA volume, ES, 1-p A2C                    56    ml       --------- LA volume/bsa, ES, 1-p A2C                25.9  ml/m^2   ---------  Mitral valve                              Value          Reference Mitral E-wave peak velocity               43.4  cm/s     --------- Mitral A-wave peak velocity               69.6  cm/s     --------- Mitral deceleration time          (H)     419   ms       150 - 230 Mitral E/A ratio, peak                    0.6            ---------  Pulmonary arteries                        Value          Reference PA pressure, S, DP                        20    mm Hg    <=30  Tricuspid valve  Value          Reference Tricuspid regurg peak velocity            204   cm/s     --------- Tricuspid peak RV-RA gradient             17    mm Hg    ---------  Systemic veins                            Value          Reference Estimated CVP                             3     mm Hg    ---------  Right ventricle                           Value          Reference RV pressure, S, DP                        20    mm Hg    <=30 RV s&', lateral, S                         10.5  cm/s     ---------  Legend: (L)  and  (H)  mark values outside specified reference range.  ------------------------------------------------------------------- Prepared and  Electronically Authenticated by  Chilton Si, MD 2019-04-02T13:31:20   TEE  ECHO TEE 06/05/2017  Interpretation Summary  Septum: Atrial septal motion is hypermobile.  Mitral valve: Dilated mitral annulus. Moderate leaflet thickening is present. Mild-to-moderate stenosis. Trace regurgitation. There is non-mobile anterior leaflet vegetation.  Aortic valve: The valve is trileaflet. Mild valve thickening present. No stenosis. Severe regurgitation. Holodiastolic flow reversal in the descending thoracic aorta. Large mobile vegetation present on the noncoronary cusp. 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  Aorta: The aortic root is mildly dilated at the sinus of Valsalva.  Right ventricle: Normal wall thickness and ejection fraction. Cavity is mildly dilated.  Tricuspid valve: Trace regurgitation.   MONITORS  CARDIAC EVENT MONITOR 01/10/2017  Narrative Sinus to sinus tach with pacs and pvcs Olga Millers          Recent Labs: No results found for requested labs within last 365 days.  Recent Lipid Panel    Component Value Date/Time   CHOL 165 03/30/2017 0353   TRIG 91 03/30/2017 0353   HDL 24 (L) 03/30/2017 0353   CHOLHDL 6.9 03/30/2017 0353   VLDL 18 03/30/2017 0353   LDLCALC 123 (H) 03/30/2017 0353   LDLDIRECT 160.9 02/12/2013 0734    History of Present Illness    67 year old male with the above past medical history including aortic regurgitation s/p aortic valve replacement, aortic root dilation, hypertension, and hyperlipidemia.  Echocardiogram in 2018 showed EF 55%, severe left ventricular ,enlargement, G1 DD, severe aortic insufficiency, dilated aortic root at 46 mm, mild mitral valve regurgitation, mild left atrial enlargement. CTA of the chest/aorta in 2018 showed aortic root at 4.7 cm.  TEE November 2018 showed normal LV function, aortic valve vegetation on the left coronary cusp, severe aortic insufficiency, mildly dilated aortic root, redundant mitral  valve chordae and associated vegetation could not be excluded, mild mitral regurgitation.  Blood cultures  grew coagulation negative staph.  He was placed on antibiotics and ultimately underwent aortic valve replacement with bioprosthetic valve in February 2019.  Cardiac catheterization prior to surgery showed normal coronary arteries, normal right and left heart pressures.  Preoperative carotid Dopplers showed no evidence of stenosis.  He did have postop atrial fibrillation, treated with amiodarone.  Most recent echocardiogram in 2019 showed EF 50 to 55%, G1 DD, normally functioning bioprosthetic aortic valve, mild mitral valve regurgitation. CT chest/aorta in 05/2021  showed stable changes of aortic valve replacement; aortic root 4.3 cm.  He was evaluated by CT surgery who recommended follow-up with Dr. Jens Som for ongoing monitoring of aortic root with routine echocardiogram was recommended.  He was last seen in the office on  06/14/2022 and was doing well from a cardiac standpoint. Follow-up was recommended in 1 year.   He presents today for follow-up and for preoperative cardiac evaluation for left extracorporeal shockwave lithotripsy on 12/24/2022 with Dr. Heloise Purpura of [facility] with request to hold aspirin prior to procedure. Since he has done well from a cardiac standpoint.  He denies any chest pain, dyspnea.  BP has been well-controlled.  Overall, he reports feeling well.  Home Medications    Current Outpatient Medications  Medication Sig Dispense Refill   acetaminophen (TYLENOL) 500 MG tablet Take 1 tablet (500 mg total) by mouth every 6 (six) hours as needed (for pain.). 30 tablet 0   aluminum chloride (DRYSOL) 20 % external solution Apply 1 application  topically 3 (three) times daily as needed (for sweating.).     amoxicillin (AMOXIL) 500 MG tablet For dental procedures     Ascorbic Acid (VITAMIN C) 1000 MG tablet Take 1,000 mg by mouth daily.     aspirin EC 81 MG tablet Take 81 mg  by mouth daily.     atorvastatin (LIPITOR) 10 MG tablet Take 1 tablet (10 mg total) by mouth daily at 6 PM. 30 tablet 6   betamethasone valerate (VALISONE) 0.1 % cream Apply 1 application  topically 2 (two) times daily as needed (for skin irritation.).     cetirizine (ZYRTEC) 10 MG tablet Take 10 mg by mouth daily as needed for allergies.     clobetasol (OLUX) 0.05 % topical foam Apply to affected area after bathing 50 g 11   ECHINACEA PO Take 760 mg by mouth daily.     fluorometholone (FML) 0.1 % ophthalmic suspension Place 1 drop into both eyes 3 (three) times daily as needed (for irritated/itchy eyes.).     fluticasone (FLONASE) 50 MCG/ACT nasal spray Place 1 spray into both nostrils at bedtime as needed for allergies or rhinitis.     loratadine (CLARITIN) 10 MG tablet Take 10 mg by mouth daily as needed for allergies.     losartan (COZAAR) 50 MG tablet TAKE 1 TABLET DAILY 90 tablet 3   metoprolol tartrate (LOPRESSOR) 25 MG tablet TAKE 1/2 TABLET (12.5MG     TOTAL) TWO TIMES A DAY 90 tablet 3   Naphazoline-Pheniramine 0.027-0.315 % SOLN Place 1-2 drops into both eyes 3 (three) times daily as needed (for allergy eyes.).     oxyCODONE (OXY IR/ROXICODONE) 5 MG immediate release tablet Take 1 tablet (5 mg total) by mouth every 6 (six) hours as needed for moderate pain. 24 tablet 0   tadalafil (CIALIS) 20 MG tablet Take 20 mg by mouth daily as needed for erectile dysfunction.     tamsulosin (FLOMAX) 0.4 MG CAPS capsule Take 1 capsule (0.4 mg total)  by mouth daily. 14 capsule 1   triamcinolone cream (KENALOG) 0.1 % Apply 1 application topically daily as needed (FOR DRY SKIN/ITCHY SKIN.). Apply to area after bath (DO NOT APPLY TO FACE)  1   No current facility-administered medications for this visit.     Review of Systems    He denies chest pain, palpitations, dyspnea, pnd, orthopnea, n, v, dizziness, syncope, edema, weight gain, or early satiety. All other systems reviewed and are otherwise  negative except as noted above.   Physical Exam    VS:  BP 114/80   Pulse 79   Ht 5\' 11"  (1.803 m)   Wt 218 lb 12.8 oz (99.2 kg)   SpO2 97%   BMI 30.52 kg/m  GEN: Well nourished, well developed, in no acute distress. HEENT: normal. Neck: Supple, no JVD, carotid bruits, or masses. Cardiac: RRR, no murmurs, rubs, or gallops. No clubbing, cyanosis, edema.  Radials/DP/PT 2+ and equal bilaterally.  Respiratory:  Respirations regular and unlabored, clear to auscultation bilaterally. GI: Soft, nontender, nondistended, BS + x 4. MS: no deformity or atrophy. Skin: warm and dry, no rash. Neuro:  Strength and sensation are intact. Psych: Normal affect.  Accessory Clinical Findings    ECG personally reviewed by me today - EKG Interpretation Date/Time:  Monday December 10 2022 15:22:09 EDT Ventricular Rate:  79 PR Interval:  192 QRS Duration:  96 QT Interval:  386 QTC Calculation: 442 R Axis:   -15  Text Interpretation: Normal sinus rhythm Nonspecific T wave abnormality, improved in Inferior leads Confirmed by [name] ([id number]) on 12/10/2022 3:43:26 PM  - no acute changes.   Lab Results  Component Value Date   WBC 9.2 05/22/2018   HGB 15.6 05/22/2018   HCT 47.9 05/22/2018   MCV 88.2 05/22/2018   PLT 196 05/22/2018   Lab Results  Component Value Date   CREATININE 1.11 05/22/2018   BUN 20 05/15/2018   NA 139 05/15/2018   K 4.3 05/15/2018   CL 106 05/15/2018   CO2 23 05/15/2018   Lab Results  Component Value Date   ALT 13 (L) 05/30/2017   AST 19 05/30/2017   ALKPHOS 63 05/30/2017   BILITOT 0.8 05/30/2017   Lab Results  Component Value Date   CHOL 165 03/30/2017   HDL 24 (L) 03/30/2017   LDLCALC 123 (H) 03/30/2017   LDLDIRECT 160.9 02/12/2013   TRIG 91 03/30/2017   CHOLHDL 6.9 03/30/2017    Lab Results  Component Value Date   HGBA1C 6.0 (H) 07/24/2022    Assessment & Plan   1. Aortic valve regurgitation: S/p AVR.  Most recent echo in 2019 showed only  functioning aortic valve prosthesis.  Given need for ongoing monitoring of aortic root dilation, will repeat echocardiogram for routine monitoring of aortic valve/aortic root. Continue SBE prophylaxis.  2. Aortic root dilation: CT chest/aorta in 05/2021  showed stable changes of aortic valve replacement; aortic root 4.3 cm. He was evaluated by CT surgery who recommended follow-up with Dr. Jens Som for ongoing monitoring of aortic root with routine echo was recommended. Will repeat echocardiogram for routine monitoring as above.  3. Hypertension: BP well controlled. Continue current antihypertensive regimen.   4. Hyperlipidemia: LDL was 97 in 04/2022.  Monitored per PCP. Continue Lipitor.  5. Preoperative cardiac exam: According to the Revised Cardiac Risk Index (RCRI), his Perioperative Risk of Major Cardiac Event is (%): 0.4. His Functional Capacity in METs is: 8.97 according to the Duke Activity Status Index (DASI). Therefore,  based on ACC/AHA guidelines, patient would be at acceptable risk for the planned procedure without further cardiovascular testing. Per office protocol, he may hold Aspirin for 5-7 days prior to procedure. Please resume Aspirin as soon as possible postprocedure, at the discretion of the surgeon. I will route this recommendation to the requesting party via Epic fax function.  6. Disposition: Follow-up in 6 months with Dr. Jens Som.      Joylene Grapes, NP 12/10/2022, 4:29 PM

## 2022-12-10 NOTE — Telephone Encounter (Signed)
I s/w the pt and he has been informed per Dr. Laverle Patter wants him to be seen in the office with an EKG for pre op clearance. Pt is agreeable and has been scheduled to see [name], NP today at 3:40. I will update all parties involved. Pt thanked me for the help.

## 2022-12-20 ENCOUNTER — Encounter (HOSPITAL_BASED_OUTPATIENT_CLINIC_OR_DEPARTMENT_OTHER): Payer: Self-pay | Admitting: Urology

## 2022-12-20 NOTE — Progress Notes (Signed)
Attempted to make preop call for lithotripsy scheduled for 12/24/2022. No answer, message left to return call at [phone].

## 2022-12-20 NOTE — Progress Notes (Signed)
Spoke w/ via phone for pre-op interview--- [name] Anderson (Name & DOB verified) Lab needs dos----      KUB       COVID test -----patient states asymptomatic no test needed Arrive at ------- 0600 NPO after MN NO Solid Food.  Clear liquids from MN until--- NPO after midnight Med rec completed. Pt aware to hold ASA/NSAIDs and supplements per [facility] protocol. - Last ASA dose 12/19/22 per pt Medications to take morning of surgery ----- losartan and metoprolol Diabetic/Weight loss medication ----- n/a No Alcohol or recreational drugs for 24 hours/Tobacco products for 6 hours ---- to hold tobacco Patient instructed to bring blue lithotripsy folder, photo id and insurance card day of surgery. Patient aware to have Driver (ride ) / caregiver for 24 hours after surgery ----- [name] (spouse) Patient Special Instructions ----- Bring CPAP Pre-Op special Instructions ----- take laxative of choice day before procedure Patient verbalized understanding of instructions that were given at this phone interview. Patient denies shortness of breath, chest pain, fever, cough at this phone interview.   Cardiac clearance & EKG in Epic 12/10/2022

## 2022-12-21 NOTE — H&P (Signed)
Office Visit Report     12/03/2022   --------------------------------------------------------------------------------   [name] Anderson  MRN: [id number]  DOB: [birth year], 67 year old Male  SSN: [id number]   PRIMARY CARE:  Tally Joe, MD  PRIMARY CARE FAX:  [phone]  REFERRING:  Bertram Millard. Dahlstedt, MD  PROVIDER:  Jettie Pagan, M.D.  TREATING:  Buzzy Han, PA-C  LOCATION:  [facility] [phone]     --------------------------------------------------------------------------------   CC/HPI: He has a h/o stone disease. He underwent ESL of an 8 mm Lt renal stone in 2022. No recent sx's.   [name] Anderson returns for follow-up of right radical orchiectomy on 5.15.2015 for right testicular mass. 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 Markers were all negative beforehand. CT chest abdomen and pelvis following the procedure revealed postoperative fluid collection in the right hemiscrotum, a left hydrocele/small, no evidence of metastatic disease. There were several small stable bilateral pulmonary nodules.   He has had no evidence of recurrence to date--markers and Ct scans all nml.   5.2022----most recent abdominal cross-secctional imaging (performed for abd pain--ordered by Dr Azucena Cecil--  1. Sigmoid diverticulitis with wall thickening, surrounding  inflammation and scattered air bubbles throughout the peritoneal  cavity consistent with micro perforation. No drainable fluid  collection or bowel obstruction. Consider general surgical  consultation.  2. Nonobstructing bilateral renal calculi. No evidence of ureteral  calculus.  3. Hepatic steatosis. Resolution of previously demonstrated splenic  lesion.  4. No evidence of metastatic disease status post right orchiectomy.  5. Aortic Atherosclerosis (ICD10-I70.0).   Scrotal U/S 2022--Lt hydrocele, microlithiasis of Lt testicle w/o other testicular abnormalities.   6.21.2024: Here today for routine check. Feels like  he passed a right sided stone within the past 2 to 3 weeks. Currently is asymptomatic. No significant lower urinary tract symptoms.   12/03/2022:  Patient returns for overdue 2-week follow-up 6 mm mid left ureteral calculus, with KUB. He continues on MET. Since last seen, patient endorses intermittent mild to moderate stone pain, now at left lower quadrant, with radiation into the left groin. He has been relying on NSAIDs with Tylenol. He has recently experienced some nausea with stone pain flare. Today, he denies irritative symptoms, dysuria, gross hematuria, fever/chills, nausea/vomiting.     ALLERGIES: No Allergies    MEDICATIONS: Aspirin 325 mg tablet  Cialis 20 mg tablet 1 tablet PO PRN  Metoprolol Tartrate 25 mg tablet  Atorvastatin Calcium 20 MG Oral Tablet Oral  Losartan Potassium     GU PSH: ESWL, Left - 2022, 2009, 2009 Locm 300-399Mg /Ml Iodine,1Ml - 2019, 2017 Radical Orchiectomy - 2015 Ureteroscopic stone removal - 2009       PSH Notes: Surgery Of Male Genitalia Orchiectomy Radical, Hernia Repair, Lithotripsy, Cystoscopy With Ureteroscopy With Removal Of Calculus, Lithotripsy, Nose Surgery   NON-GU PSH: Heart Surgery (Unspecified), valve replacement - 2019 Hernia Repair - 2015     GU PMH: ED due to arterial insufficiency, Treated - 10/19/2022, - 2022 History of testicular cancer, No evidence of disease 9 years out from orchiectomy - 10/19/2022, St 2 seminoma, now ~ 8 years 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 He is no longer under follow-up surveillance. - 2020, (Stable), No evidence of recurrence now about 4 years out from his right orchiectomy, - 2019 (Stable), No evidence of recurrence now 3 years out from his right orchiectomy., - 2018 (Stable), No evidence of recurrence of the seminoma0we need to repeat imaging, - 2017, Testicular cancer, - 2017, Seminoma of testis, unspecified laterality, - 2016 Ureteral calculus, Calculus of left ureter - 2015, Calculus of ureter, - 2015 Abdominal Pain Unspec, Left flank pain - 2015 Hydronephrosis Unspec, Hydronephrosis, left - 2015 Disorder of male genital organs, unspecified, Scrotal mass - 2015    NON-GU PMH: Bacteriuria, He has mild dysuria. We will culture his urine. - 2018 Encounter for general adult medical examination without abnormal findings, Encounter for preventive health examination - 2017 Nausea, Nausea - 2015 Personal history of other diseases of the circulatory system, History of cardiac murmur - 2015 Personal history of other endocrine, nutritional and metabolic disease, History of hypercholesterolemia - 2015 Skin Cancer, History, History of malignant neoplasm of skin - 2015    Immunizations: None   FAMILY HISTORY: 2 sons - Son Colon Cancer - Runs In Family Death In The Family Father - Father Hypercholesterolemia - Runs In Family Kidney Stones - Runs In Family   SOCIAL HISTORY: Marital Status: Married Preferred Language: English; Ethnicity: Not Hispanic Or Latino; Race: White Current Smoking Status: Patient has never smoked.  Does not use smokeless tobacco. Has never drank.  Does not use drugs. Drinks 3 caffeinated drinks per day. Has not had a blood transfusion. Patient's occupation Ecologist.[employer]     Notes: Never a smoker, Caffeine Use, Retired, Marital History - Currently Married, Alcohol Use, Occupation:   VITAL SIGNS:      12/03/2022 01:35 PM  BP  111/77 mmHg  Pulse 73 /min  Temperature 98.0 F / 36.6 C   MULTI-SYSTEM PHYSICAL EXAMINATION:    Constitutional: Obese. No physical deformities. Normally developed. Good grooming. Patient is pleasant, no acute discomfort, distress.  Respiratory: No labored breathing, no use of accessory muscles.   Cardiovascular: Normal temperature, normal extremity pulses, no swelling, no varicosities.  Skin: No paleness, no jaundice, no cyanosis. No lesion, no ulcer, no rash.  Neurologic / Psychiatric: Oriented to time, oriented to place, oriented to person. No depression, no anxiety, no agitation.  Gastrointestinal: Left lower quadrant tenderness to palpation. No left-sided CVA.     Complexity of Data:  Source Of History:  Patient, Medical Record Summary  Records Review:   Previous Doctor Records, Previous Patient Records  Urine Test Review:   Urinalysis  X-Ray Review: KUB: Reviewed Films. Discussed With Patient.     10/19/22  PSA  Total PSA 0.25 ng/mL    12/03/22  Urinalysis  Urine Appearance Slightly Cloudy   Urine Color Yellow   Urine Glucose Neg mg/dL  Urine Bilirubin Neg mg/dL  Urine Ketones Neg mg/dL  Urine Specific Gravity 1.025   Urine Blood 3+ ery/uL  Urine pH 6.0   Urine Protein Trace mg/dL  Urine Urobilinogen 0.2 mg/dL  Urine Nitrites Neg   Urine Leukocyte Esterase Neg leu/uL  Urine WBC/hpf 0 - 5/hpf   Urine RBC/hpf >60/hpf   Urine Epithelial Cells NS (Not Seen)   Urine Bacteria Rare (0-9/hpf)   Urine Mucous Present   Urine Yeast NS (Not Seen)   Urine  Trichomonas Not Present   Urine Cystals NS (Not Seen)   Urine Casts NS (Not Seen)   Urine Sperm Present    PROCEDURES:         KUB - 40981  A single view of the abdomen is obtained. Bilateral renal shadows visualized, with opacifications compatible with renal calculi, bilaterally. The expected anatomical course of the right ureter is grossly clear. The expected anatomical course of the left ureter persists with now a distal  6 mm calcification. It was at the level of L4 on most recent imaging, now lateral to left sacral wing. Pelvic inlet otherwise clear.      . Patient confirmed No Neulasta OnPro Device.           Urinalysis w/Scope Dipstick Dipstick Cont'd Micro  Color: Yellow Bilirubin: Neg mg/dL WBC/hpf: 0 - 5/hpf  Appearance: Slightly Cloudy Ketones: Neg mg/dL RBC/hpf: >19/JYN  Specific Gravity: 1.025 Blood: 3+ ery/uL Bacteria: Rare (0-9/hpf)  pH: 6.0 Protein: Trace mg/dL Cystals: NS (Not Seen)  Glucose: Neg mg/dL Urobilinogen: 0.2 mg/dL Casts: NS (Not Seen)    Nitrites: Neg Trichomonas: Not Present    Leukocyte Esterase: Neg leu/uL Mucous: Present      Epithelial Cells: NS (Not Seen)      Yeast: NS (Not Seen)      Sperm: Present    ASSESSMENT:      ICD-10 Details  1 GU:   History of urolithiasis - Z87.442 Chronic, Exacerbation   PLAN:           Schedule Return Visit/Planned Activity: Next Available Appointment - Schedule Surgery             Note: ESWL          Document Letter(s):  Created for Patient: Clinical Summary         Notes:   Today, UA with microhematuria. KUB with persistence of 6 mm stone, now in the distal left ureter. Patient continues to experience intermittent mild to moderate stone pain, with intermittent nausea. Pain is concentrated in left lower quadrant, with radiation to groin.   We reviewed with patient options going forward, with continued MET, versus definitive therapy with ESWL versus ureteroscopy. We briefly reviewed both of these procedures, as patient has experienced both in the past. However, as patient has had this likely obstructing stone for over 2 months, we advised for definitive therapy at this time. Patient would like to proceed with ESWL. We think this is reasonable.   Schedule next available ESWL. Patient knows to expect his urologist scheduler to contact him to set this up. He also knows to inform this office of passage of stone in the interim, or  development of worsening presentation, or any further concerns. Continue on MET. Patient voiced understanding and is amenable to this plan.    * Signed by Buzzy Han, PA-C on 12/03/22 at 2:34 PM (EDT)*

## 2022-12-24 ENCOUNTER — Ambulatory Visit (HOSPITAL_COMMUNITY): Payer: Medicare Other

## 2022-12-24 ENCOUNTER — Encounter (HOSPITAL_BASED_OUTPATIENT_CLINIC_OR_DEPARTMENT_OTHER): Payer: Self-pay | Admitting: Urology

## 2022-12-24 ENCOUNTER — Encounter (HOSPITAL_BASED_OUTPATIENT_CLINIC_OR_DEPARTMENT_OTHER)
Admission: RE | Disposition: A | Discharge status: Home / Self Care | Payer: Self-pay | Source: Ambulatory Visit | Attending: Urology

## 2022-12-24 ENCOUNTER — Ambulatory Visit (HOSPITAL_BASED_OUTPATIENT_CLINIC_OR_DEPARTMENT_OTHER)
Admission: RE | Admit: 2022-12-24 | Discharge: 2022-12-24 | Disposition: A | Discharge status: Home / Self Care | Payer: Medicare Other | Source: Ambulatory Visit | Attending: Urology | Admitting: Urology

## 2022-12-24 DIAGNOSIS — Z539 Procedure and treatment not carried out, unspecified reason: Secondary | ICD-10-CM | POA: Insufficient documentation

## 2022-12-24 DIAGNOSIS — N201 Calculus of ureter: Secondary | ICD-10-CM | POA: Diagnosis not present

## 2022-12-24 DIAGNOSIS — R000 Tachycardia, unspecified: Secondary | ICD-10-CM | POA: Insufficient documentation

## 2022-12-24 DIAGNOSIS — N433 Hydrocele, unspecified: Secondary | ICD-10-CM | POA: Insufficient documentation

## 2022-12-24 HISTORY — PX: EXTRACORPOREAL SHOCK WAVE LITHOTRIPSY: SHX1557

## 2022-12-24 SURGERY — LITHOTRIPSY, ESWL
Anesthesia: LOCAL | Laterality: Left

## 2022-12-24 MED ORDER — CIPROFLOXACIN HCL 500 MG PO TABS
500.0000 mg | ORAL_TABLET | ORAL | Status: AC
  Administered 2022-12-24: 500 mg via ORAL

## 2022-12-24 MED ORDER — DIAZEPAM 5 MG PO TABS
ORAL_TABLET | ORAL | Status: AC
  Filled 2022-12-24: qty 2

## 2022-12-24 MED ORDER — DIAZEPAM 5 MG PO TABS
10.0000 mg | ORAL_TABLET | ORAL | Status: AC
  Administered 2022-12-24: 10 mg via ORAL

## 2022-12-24 MED ORDER — DIPHENHYDRAMINE HCL 25 MG PO CAPS
25.0000 mg | ORAL_CAPSULE | ORAL | Status: AC
  Administered 2022-12-24: 25 mg via ORAL

## 2022-12-24 MED ORDER — SODIUM CHLORIDE 0.9 % IV SOLN: INTRAVENOUS | Status: DC

## 2022-12-24 MED ORDER — CIPROFLOXACIN HCL 500 MG PO TABS
ORAL_TABLET | ORAL | Status: AC
  Filled 2022-12-24: qty 1

## 2022-12-24 MED ORDER — DIPHENHYDRAMINE HCL 25 MG PO CAPS
ORAL_CAPSULE | ORAL | Status: AC
  Filled 2022-12-24: qty 1

## 2022-12-24 NOTE — Op Note (Addendum)
Mr. [name] Anderson procedure was cancelled today.  As he was getting ready, he was placed on a cardiac monitor.  He was initially in NSR with a rate of about 70 bmp.  However, he then flipped into a sinus tachycardia with loss of P waves at a rate of 105.  He would flip back and forth multiple times.  He remained asymptomatic without palpitations or chest pain.  He remained hemodynamically stable.  As a precaution, his procedure will be cancelled today to allow for further cardiac evaluation.

## 2022-12-24 NOTE — Discharge Instructions (Signed)
1. You should strain your urine and collect all fragments and bring them to your follow up appointment.  °2. You should take your pain medication as needed.  Please call if your pain is severe to the point that it is not controlled with your pain medication. °3. You should call if you develop fever > 101 or persistent nausea or vomiting. °4. Your doctor may prescribe tamsulosin to take to help facilitate stone passage. °

## 2022-12-24 NOTE — Interval H&P Note (Signed)
History and Physical Interval Note:  12/24/2022 7:01 AM  [name] Anderson  has presented today for surgery, with the diagnosis of LEFT DISTAL URETERAL STONE.  The various methods of treatment have been discussed with the patient and family. After consideration of risks, benefits and other options for treatment, the patient has consented to  Procedure(s) with comments: LEFT EXTRACORPOREAL SHOCK WAVE LITHOTRIPSY (ESWL) (Left) - 75 MINUTES NEEDED FOR CASE as a surgical intervention.  The patient's history has been reviewed, patient examined, no change in status, stable for surgery.  I have reviewed the patient's chart and labs.  Questions were answered to the patient's satisfaction.     [employer]

## 2022-12-25 ENCOUNTER — Encounter (HOSPITAL_BASED_OUTPATIENT_CLINIC_OR_DEPARTMENT_OTHER): Payer: Self-pay | Admitting: Urology

## 2022-12-27 ENCOUNTER — Other Ambulatory Visit: Payer: Self-pay | Admitting: Urology

## 2022-12-27 ENCOUNTER — Telehealth: Payer: Self-pay | Admitting: Cardiology

## 2022-12-27 NOTE — Telephone Encounter (Signed)
   Pre-operative Risk Assessment    Patient Name: [name] Anderson  DOB: 05-02-1955 MRN: [id number]      Request for Surgical Clearance    Procedure:   Left Extracorporeal Shock Wave Clithopiltsy  Date of Surgery:  Clearance 01/14/23                                 Surgeon:  Dr. Liliane Shi Surgeon's Group or Practice Name:  [facility]  Phone number:  [phone] Fax number:  [phone]   Type of Clearance Requested:   - Medical  - Pharmacy:  Hold Aspirin 3 days    Type of Anesthesia:  Local    Additional requests/questions:      SignedEmilie Rutter   12/27/2022, 11:03 AM

## 2022-12-27 NOTE — Telephone Encounter (Signed)
   Name: [name] Anderson  DOB: [birth year]  MRN: [id number]  Primary Cardiologist: Olga Millers, MD  Chart reviewed as part of pre-operative protocol coverage. The patient has an upcoming visit scheduled with [name], NP on 01/07/2023 at which time clearance can be addressed in case there are any issues that would impact surgical recommendations.  Lithotripsy is not scheduled until 01/14/2023 as below. I added preop FYI to appointment note so that provider is aware to address at time of outpatient visit.  Per office protocol the cardiology provider should forward their finalized clearance decision and recommendations regarding antiplatelet therapy to the requesting party below.     I will route this message as FYI to requesting party and remove this message from the preop box as separate preop APP input not needed at this time.   Please call with any questions.  Joylene Grapes, NP  12/27/2022, 12:24 PM

## 2023-01-01 ENCOUNTER — Ambulatory Visit (HOSPITAL_COMMUNITY): Payer: Medicare Other | Attending: Nurse Practitioner

## 2023-01-01 DIAGNOSIS — I351 Nonrheumatic aortic (valve) insufficiency: Secondary | ICD-10-CM | POA: Diagnosis not present

## 2023-01-01 DIAGNOSIS — I77810 Thoracic aortic ectasia: Secondary | ICD-10-CM | POA: Insufficient documentation

## 2023-01-01 LAB — ECHOCARDIOGRAM COMPLETE
AV Mean grad: 13 mmHg
AV Peak grad: 22.6 mmHg
Ao pk vel: 2.38 m/s
Area-P 1/2: 2.95 cm2
S' Lateral: 2.7 cm

## 2023-01-03 ENCOUNTER — Telehealth: Payer: Self-pay

## 2023-01-03 NOTE — Telephone Encounter (Signed)
Spoke with pt. Pt was notified of Echo results. Pt will discuss further at his scheduled follow up 01/07/23.

## 2023-01-07 ENCOUNTER — Ambulatory Visit: Payer: Medicare Other | Attending: Nurse Practitioner | Admitting: Physician Assistant

## 2023-01-07 ENCOUNTER — Ambulatory Visit: Payer: Medicare Other | Admitting: Nurse Practitioner

## 2023-01-07 ENCOUNTER — Telehealth: Payer: Self-pay | Admitting: *Deleted

## 2023-01-07 ENCOUNTER — Ambulatory Visit (INDEPENDENT_AMBULATORY_CARE_PROVIDER_SITE_OTHER): Payer: Medicare Other

## 2023-01-07 VITALS — BP 106/58 | HR 66 | Ht 71.0 in | Wt 218.0 lb

## 2023-01-07 DIAGNOSIS — I4719 Other supraventricular tachycardia: Secondary | ICD-10-CM | POA: Insufficient documentation

## 2023-01-07 DIAGNOSIS — Z01810 Encounter for preprocedural cardiovascular examination: Secondary | ICD-10-CM | POA: Insufficient documentation

## 2023-01-07 MED ORDER — METOPROLOL TARTRATE 25 MG PO TABS: 25.0000 mg | ORAL_TABLET | Freq: Two times a day (BID) | ORAL | 3 refills | Status: DC

## 2023-01-07 MED ORDER — LOSARTAN POTASSIUM 25 MG PO TABS: 25.0000 mg | ORAL_TABLET | Freq: Every day | ORAL | 3 refills | Status: DC

## 2023-01-07 NOTE — Progress Notes (Unsigned)
Cardiology Office Note:  .   Date:  01/07/2023  ID:  [name] Anderson, DOB 06-Dec-1955, MRN [id number] PCP: Tally Joe, MD   [facility] Providers Cardiologist:  Olga Millers, MD     History of Present Illness: [name] Anderson is a 67 y.o. male with PMH of testicular cancer, HLD, mild OSA not on CPAP, moderate AR and dilated aortic root. CTA of the chest obtained on 03/29/2016 showed stable dilatation of aortic root at 4.7 cm. Echo obtained on 04/13/2016 showed normal EF, mild LVH, grade 1 DD, mild-to-moderate AI, aortic root 47 mm, mild MR. CTA in November 2016 showed dilated aortic root at 4.8 cm.   I saw the patient in August 2018 for syncopal episode.  I ordered a cardiac event monitor along with echocardiogram and CTA of the chest.  CT of the chest showed aortic root measuring 4.7 cm, there is also a wedge-shaped lesion in the spleen question splenic infarct.  However patient was completely asymptomatic for any abdominal discomfort.  When he was seen by Dr. Jens Som back on 03/28/2017, he did complain of transient double vision.  TEE performed in November 2018 showed normal LV function, aortic valve vegetation on the left coronary cusp, severe AI, mildly dilated aortic root, redundant mitral valve chordae and associated vegetation could not be excluded.  Blood culture grew coag negative staph.  He was placed on antibiotic with plan for aortic valve and aortic root replacement after completing 6 weeks of antibiotic.  Cardiac catheterization in December 2018 showed normal coronaries and normal left and right her pressure.  He eventually underwent aortic valve replacement using 25 mm Magna Ease valve.  Although there was discussion of replacing the aortic root as well, however at the time of surgery, the aortic root was measuring only 4.1 cm.  The hospital course was complicated by postop atrial fibrillation and he was placed on oral amiodarone.  Echocardiogram in 2019 showed EF 50 to 55%, grade 1  DD, normal functioning bioprosthetic aortic valve, mild MR.  CT of the chest aorta obtained in February 2023 showed stable change of the aortic valve replacement, aortic root measuring 4.3 cm.  More recently, patient was seen by [name] on 12/10/2022 for preoperative clearance prior to left extracorporeal shock lithotripsy.  Patient was cleared to proceed for the cardiac perspective.  When he presented for the surgery on 12/24/2022, he had episodes of intermittent sinus tachycardia with heart rate of 105.  Echocardiogram obtained on 01/01/2023 showed EF 55 to 60%, grade 1 DD, bioprosthetic aortic valve without significant stenosis or regurgitation, dilated aortic root measuring at 42 mm.   He presents today for follow-up.  He does have with him the strips from Dr. Laverle Patter.  As this was personally reviewed and also reviewed with Dr. Royann Shivers DOD, underlying rhythm could represent either atrial tachycardia versus 2-1 atrial flutter, however atrial tachycardia is more likely as the heart rate is not very fast.  We eventually decided to reduce his dose of losartan to 25 mg daily while increasing metoprolol tartrate to 25 mg twice a day for better rate control.  We both agree fast bursts should not interfere with his upcoming surgery.  He is at acceptable risk to proceed from the cardiac perspective.  We will obtain a 2-week ZIO monitor, this should be done after his upcoming surgery.  We will see the patient back in 6 weeks for reassessment.   ROS: ***  Studies Reviewed: .        ***  Risk Assessment/Calculations:   {Does this patient have ATRIAL FIBRILLATION?:[id number]} No BP recorded.  {Refresh Note OR Click here to enter BP  :1}***       Physical Exam:   VS:  There were no vitals taken for this visit.   Wt Readings from Last 3 Encounters:  12/24/22 216 lb (98 kg)  12/10/22 218 lb 12.8 oz (99.2 kg)  10/04/22 236 lb (107 kg)    GEN: Well nourished, well developed in no acute distress NECK: No  JVD; No carotid bruits CARDIAC: ***RRR, no murmurs, rubs, gallops RESPIRATORY:  Clear to auscultation without rales, wheezing or rhonchi  ABDOMEN: Soft, non-tender, non-distended EXTREMITIES:  No edema; No deformity   ASSESSMENT AND PLAN: .   ***    {Are you ordering a CV Procedure (e.g. stress test, cath, DCCV, TEE, etc)?   Press F2        :[id number]}  Dispo: ***  Signed, Azalee Course, PA

## 2023-01-07 NOTE — Patient Instructions (Signed)
Medication Instructions:  DECREASE LOSARTAN TO 25 MG DAILY  INCREASE METOPROLOL TARTRATE TO 25 MG TWICE DAILY. *If you need a refill on your cardiac medications before your next appointment, please call your pharmacy*   Lab Work: NO LABS If you have labs (blood work) drawn today and your tests are completely normal, you will receive your results only by: MyChart Message (if you have MyChart) OR A paper copy in the mail If you have any lab test that is abnormal or we need to change your treatment, we will call you to review the results.   Testing/Procedures: Your physician has recommended that you wear a ZIO holter monitor. ZIO holter monitors are medical devices that record the heart's electrical activity. Doctors most often use these monitors to diagnose arrhythmias. Arrhythmias are problems with the speed or rhythm of the heartbeat. The monitor is a small, portable device. You can wear one while you do your normal daily activities. This is usually used to diagnose what is causing palpitations/syncope (passing out).    Follow-Up: At [facility], you and your health needs are our priority.  As part of our continuing mission to provide you with exceptional heart care, we have created designated Provider Care Teams.  These Care Teams include your primary Cardiologist (physician) and Advanced Practice Providers (APPs -  Physician Assistants and Nurse Practitioners) who all work together to provide you with the care you need, when you need it.   Your next appointment:   6 week(s) TO REVIEW MONITOR RESULTS  Provider:   Azalee Course, PA    Other Instructions- AFTER SURGERY ZIO XT- Long Term Monitor Instructions  Your physician has requested you wear a ZIO patch monitor for 14 days.  This is a single patch monitor. [employer] supplies one patch monitor per enrollment. Additional stickers are not available. Please do not apply patch if you will be having a Nuclear Stress Test,   Echocardiogram, Cardiac CT, MRI, or Chest Xray during the period you would be wearing the  monitor. The patch cannot be worn during these tests. You cannot remove and re-apply the  ZIO XT patch monitor.  Your ZIO patch monitor will be mailed 3 day USPS to your address on file. It may take 3-5 days  to receive your monitor after you have been enrolled.  Once you have received your monitor, please review the enclosed instructions. Your monitor  has already been registered assigning a specific monitor serial # to you.  Billing and Patient Assistance Program Information  We have supplied [employer] with any of your insurance information on file for billing purposes. [employer] offers a sliding scale Patient Assistance Program for patients that do not have  insurance, or whose insurance does not completely cover the cost of the ZIO monitor.  You must apply for the Patient Assistance Program to qualify for this discounted rate.  To apply, please call [employer] at [phone], select option 4, select option 2, ask to apply for  Patient Assistance Program. Meredeth Ide will ask your household income, and how many people  are in your household. They will quote your out-of-pocket cost based on that information.  [employer] will also be able to set up a 76-month, interest-free payment plan if needed.  Applying the monitor   Shave hair from upper left chest.  Hold abrader disc by orange tab. Rub abrader in 40 strokes over the upper left chest as  indicated in your monitor instructions.  Clean area with 4 enclosed alcohol pads. Let  dry.  Apply patch as indicated in monitor instructions. Patch will be placed under collarbone on left  side of chest with arrow pointing upward.  Rub patch adhesive wings for 2 minutes. Remove white label marked "1". Remove the white  label marked "2". Rub patch adhesive wings for 2 additional minutes.  While looking in a mirror, press and release button in center of patch. A small  green light will  flash 3-4 times. This will be your only indicator that the monitor has been turned on.  Do not shower for the first 24 hours. You may shower after the first 24 hours.  Press the button if you feel a symptom. You will hear a small click. Record Date, Time and  Symptom in the Patient Logbook.  When you are ready to remove the patch, follow instructions on the last 2 pages of Patient  Logbook. Stick patch monitor onto the last page of Patient Logbook.  Place Patient Logbook in the blue and white box. Use locking tab on box and tape box closed  securely. The blue and white box has prepaid postage on it. Please place it in the mailbox as  soon as possible. Your physician should have your test results approximately 7 days after the  monitor has been mailed back to [facility].  Call [facility] Customer Care at [phone] if you have questions regarding  your ZIO XT patch monitor. Call them immediately if you see an orange light blinking on your  monitor.  If your monitor falls off in less than 4 days, contact our Monitor department at [phone].  If your monitor becomes loose or falls off after 4 days call [employer] at [phone] for  suggestions on securing your monitor

## 2023-01-07 NOTE — Telephone Encounter (Signed)
   Patient Name: [name] Anderson  DOB: 1955/08/22 MRN: [id number]  Primary Cardiologist: Olga Millers, MD  Chart reviewed as part of pre-operative protocol coverage. Given past medical history and time since last visit, based on ACC/AHA guidelines, [name] Anderson is at acceptable risk for the planned procedure without further cardiovascular testing.   The case was discussed with DOD Dr. Royann Shivers.  The strips from Dr. Laverle Patter was reviewed.  Differential diagnosis is either atrial tachycardia versus 2-1 atrial flutter, although atrial tachycardia is more likely as heart rate is not very fast.  We have increased his metoprolol to 25 mg twice a day while decreasing the losartan to 25 mg daily.  This rhythm should not delay his procedure.  We will order a 2-week heart monitor, this will be done after his upcoming lithotripsy surgery.  Repeat cardiogram showed normal EF with stable bioprosthetic aortic valve.  He is at acceptable risk to proceed with upcoming procedure from the cardiac perspective.  The patient was advised that if he develops new symptoms prior to surgery to contact our office to arrange for a follow-up visit, and he verbalized understanding.  I will route this recommendation to the requesting party via Epic fax function and remove from pre-op pool.  Please call with questions.  Azalee Course, [place] 01/07/2023, 10:36 AM

## 2023-01-07 NOTE — Telephone Encounter (Signed)
Spoke with pt to reschedule appointment with Juliane Lack, NP, due to provider illness.  Pt accepted appt with Harrell Lark, PA, on 01/07/23 at 10:05am. Pt verbalized appreciation of call.

## 2023-01-07 NOTE — Progress Notes (Unsigned)
Enrolled for [employer] to mail a ZIO XT long term holter monitor to the patients address on file.   Dr. Crenshaw to read. 

## 2023-01-11 NOTE — Progress Notes (Signed)
Left voicemail for patient to return call for instructions

## 2023-01-11 NOTE — Progress Notes (Signed)
Pt returned call. Instructions given. Arrival time 0600. Nothing to eat or drink after MN. Hx and Meds reviewed. To bring CPAP in. Driver is secured.Marland Kitchen

## 2023-01-14 ENCOUNTER — Ambulatory Visit (HOSPITAL_BASED_OUTPATIENT_CLINIC_OR_DEPARTMENT_OTHER)
Admission: RE | Admit: 2023-01-14 | Discharge: 2023-01-14 | Disposition: A | Discharge status: Home / Self Care | Payer: Medicare Other | Source: Ambulatory Visit | Attending: Urology | Admitting: Urology

## 2023-01-14 ENCOUNTER — Encounter (HOSPITAL_BASED_OUTPATIENT_CLINIC_OR_DEPARTMENT_OTHER)
Admission: RE | Disposition: A | Discharge status: Home / Self Care | Payer: Self-pay | Source: Ambulatory Visit | Attending: Urology

## 2023-01-14 ENCOUNTER — Encounter (HOSPITAL_BASED_OUTPATIENT_CLINIC_OR_DEPARTMENT_OTHER): Payer: Self-pay | Admitting: Urology

## 2023-01-14 ENCOUNTER — Ambulatory Visit (HOSPITAL_COMMUNITY): Payer: Medicare Other

## 2023-01-14 ENCOUNTER — Other Ambulatory Visit: Payer: Self-pay

## 2023-01-14 DIAGNOSIS — Z952 Presence of prosthetic heart valve: Secondary | ICD-10-CM | POA: Insufficient documentation

## 2023-01-14 DIAGNOSIS — Z8547 Personal history of malignant neoplasm of testis: Secondary | ICD-10-CM | POA: Insufficient documentation

## 2023-01-14 DIAGNOSIS — E669 Obesity, unspecified: Secondary | ICD-10-CM | POA: Insufficient documentation

## 2023-01-14 DIAGNOSIS — N201 Calculus of ureter: Secondary | ICD-10-CM | POA: Insufficient documentation

## 2023-01-14 DIAGNOSIS — G4730 Sleep apnea, unspecified: Secondary | ICD-10-CM | POA: Diagnosis not present

## 2023-01-14 HISTORY — PX: EXTRACORPOREAL SHOCK WAVE LITHOTRIPSY: SHX1557

## 2023-01-14 SURGERY — LITHOTRIPSY, ESWL
Anesthesia: LOCAL | Laterality: Left

## 2023-01-14 MED ORDER — DIPHENHYDRAMINE HCL 25 MG PO CAPS
ORAL_CAPSULE | ORAL | Status: AC
  Filled 2023-01-14: qty 1

## 2023-01-14 MED ORDER — CIPROFLOXACIN HCL 500 MG PO TABS
ORAL_TABLET | ORAL | Status: AC
  Filled 2023-01-14: qty 1

## 2023-01-14 MED ORDER — CIPROFLOXACIN HCL 500 MG PO TABS
500.0000 mg | ORAL_TABLET | ORAL | Status: AC
  Administered 2023-01-14: 500 mg via ORAL

## 2023-01-14 MED ORDER — DIAZEPAM 5 MG PO TABS
ORAL_TABLET | ORAL | Status: AC
  Filled 2023-01-14: qty 2

## 2023-01-14 MED ORDER — DIAZEPAM 5 MG PO TABS
10.0000 mg | ORAL_TABLET | ORAL | Status: AC
  Administered 2023-01-14: 10 mg via ORAL

## 2023-01-14 MED ORDER — SODIUM CHLORIDE 0.9 % IV SOLN: INTRAVENOUS | Status: DC

## 2023-01-14 MED ORDER — DIPHENHYDRAMINE HCL 25 MG PO CAPS
25.0000 mg | ORAL_CAPSULE | ORAL | Status: AC
  Administered 2023-01-14: 25 mg via ORAL

## 2023-01-14 MED ORDER — TRAMADOL HCL 50 MG PO TABS: 50.0000 mg | ORAL_TABLET | Freq: Four times a day (QID) | ORAL | 0 refills | Status: AC | PRN

## 2023-01-14 NOTE — Op Note (Signed)
[facility] Operative Note  Treating Physician: Rhoderick Moody, MD  Pre-op diagnosis: 6 mm left distal ureteral stone  Post-op diagnosis: Same   Procedure: LEFT ESWL  See [facility]jel[facility] Brenner OP note scanned into chart. Also because of the size, density, location and other factors that cannot be anticipated I feel this will likely be a staged procedure. This fact supersedes any indication in the scanned [place] [facility] operative note to the contrary

## 2023-01-14 NOTE — Progress Notes (Signed)
Meets DC criteria except for sufficient voiding. 1liter IVF administered, has gone to restroom x 2, states does not have urge to void, did pass urine, but less than 5ml in urinal. Some blood noted in urine. Suprapubic area soft, encouraging PO fluids and will administer additional IVF administered. Will monitor and observe. Will plan to perform Bladder Scan if no void after fluid bolus

## 2023-01-14 NOTE — H&P (Signed)
See scanned [facility] documents for H&P.   

## 2023-01-15 ENCOUNTER — Encounter (HOSPITAL_BASED_OUTPATIENT_CLINIC_OR_DEPARTMENT_OTHER): Payer: Self-pay | Admitting: Urology

## 2023-01-15 DIAGNOSIS — I4719 Other supraventricular tachycardia: Secondary | ICD-10-CM | POA: Diagnosis not present

## 2023-01-17 ENCOUNTER — Encounter: Payer: Self-pay | Admitting: Cardiology

## 2023-02-14 ENCOUNTER — Other Ambulatory Visit: Payer: Self-pay

## 2023-02-14 MED ORDER — METOPROLOL TARTRATE 25 MG PO TABS: 37.5000 mg | ORAL_TABLET | Freq: Two times a day (BID) | ORAL | 3 refills | Status: DC

## 2023-02-18 ENCOUNTER — Ambulatory Visit: Payer: Medicare Other | Attending: Physician Assistant | Admitting: Physician Assistant

## 2023-02-18 ENCOUNTER — Encounter: Payer: Self-pay | Admitting: Physician Assistant

## 2023-02-18 VITALS — BP 102/80 | HR 66 | Ht 71.0 in | Wt 219.0 lb

## 2023-02-18 DIAGNOSIS — I712 Thoracic aortic aneurysm, without rupture, unspecified: Secondary | ICD-10-CM | POA: Diagnosis not present

## 2023-02-18 DIAGNOSIS — I471 Supraventricular tachycardia, unspecified: Secondary | ICD-10-CM | POA: Diagnosis not present

## 2023-02-18 DIAGNOSIS — Z952 Presence of prosthetic heart valve: Secondary | ICD-10-CM | POA: Diagnosis present

## 2023-02-18 NOTE — Progress Notes (Signed)
Cardiology Office Note:  .   Date:  02/18/2023  ID:  [name] Anderson, DOB 01/04/1956, MRN [id number] PCP: [name] Joe, MD  [facility] Providers Cardiologist:  [name] Millers, MD    History of Present Illness: [name] Anderson is a 66 y.o. male with PMH of testicular cancer, HLD, mild OSA not on CPAP, s/p AVR and dilated aortic root. CTA of the chest obtained on 03/29/2016 showed stable dilatation of aortic root at 4.7 cm. Echo obtained on 04/13/2016 showed normal EF, mild LVH, grade 1 DD, mild-to-moderate AI, aortic root 47 mm, mild MR. CTA in November 2016 showed dilated aortic root at 4.8 cm. I saw the patient in August 2018 for syncopal episode. I ordered a cardiac event monitor along with echocardiogram and CTA of the chest.  CT of the chest showed aortic root measuring 4.7 cm, there is also a wedge-shaped lesion in the spleen question splenic infarct.  However patient was completely asymptomatic for any abdominal discomfort.  When he was seen by Dr. Jens Anderson back on 03/28/2017, he did complain of transient double vision.  TEE performed in November 2018 showed normal LV function, aortic valve vegetation on the left coronary cusp, severe AI, mildly dilated aortic root, redundant mitral valve chordae and associated vegetation could not be excluded.  Blood culture grew coag negative staph.  He was placed on antibiotic with plan for aortic valve and aortic root replacement after completing 6 weeks of antibiotic.  Cardiac catheterization in December 2018 showed normal coronaries and normal left and right her pressure.  He eventually underwent aortic valve replacement using 25 mm Magna Ease valve.  Although there was discussion of replacing the aortic root as well, however at the time of surgery, the aortic root was measuring only 4.1 cm.  The hospital course was complicated by postop atrial fibrillation and he was placed on oral amiodarone.  Echocardiogram in 2019 showed EF 50 to 55%, grade 1 DD,  normal functioning bioprosthetic aortic valve, mild MR.  CT of the chest aorta obtained in February 2023 showed stable change of the aortic valve replacement, aortic root measuring 4.3 cm.   More recently, patient was seen by [name] Person NP on 12/10/2022 for preoperative clearance prior to left extracorporeal shock lithotripsy.  Patient was cleared to proceed for the cardiac perspective.  When he presented for the surgery on 12/24/2022, he had episodes of intermittent sinus tachycardia with heart rate of 105.  Echocardiogram obtained on 01/01/2023 showed EF 55 to 60%, grade 1 DD, bioprosthetic aortic valve without significant stenosis or regurgitation, dilated aortic root measuring at 42 mm.  Patient presented today for follow-up.  We have reviewed the recent heart monitor report.  Patient had frequent episode of either atrial tachycardia versus SVT, cannot completely rule out possibility of inappropriate sinus tachycardia.  He has no cardiac awareness of tachycardic episode, 2 patient triggered events on the heart monitor was done while he was adjusting his heart monitor.  He has not started on the higher dose of metoprolol tartrate as I recommended, I recommend he start taking the 37.5 mg twice a day dosing.  Recent heart monitor shows normal EF.  He has no sign of heart failure including shortness of breath, lower extremity edema, orthopnea or PND.  He has no exertional chest pain.  He may follow-up with Dr. Jens Anderson in 4 to 6 months.  I instructed the patient to contact cardiology service if systolic blood pressure to below 95 mmHg.  ROS:   He denies chest pain, palpitations, dyspnea, pnd, orthopnea, n, v, dizziness, syncope, edema, weight gain, or early satiety. All other systems reviewed and are otherwise negative except as noted above.    Studies Reviewed: .        Cardiac Studies & Procedures   CARDIAC CATHETERIZATION  CARDIAC CATHETERIZATION 04/18/2017  Narrative 1. Widely patent coronary  arteries (right dominant) 2. Normal right and left heart pressures  Findings Coronary Findings Diagnostic  Dominance: Right  Left Main Vessel is angiographically normal.  Left Anterior Descending Vessel is angiographically normal.  First Diagonal Branch The LAD is patent to the LV apex without obstructive disease.  Left Circumflex Large vessel, angiographically normal.  Second Obtuse Marginal Branch Vessel is angiographically normal.  Right Coronary Artery Vessel is angiographically normal. Large, dominant vessel, angiographically normal  Intervention  No interventions have been documented.     ECHOCARDIOGRAM  ECHOCARDIOGRAM COMPLETE 01/01/2023  Narrative ECHOCARDIOGRAM REPORT    Patient Name:   [name] Anderson  Date of Exam: 01/01/2023 Medical Rec #:  [id number]     Height:       71.0 in Accession #:    [id number]    Weight:       216.0 lb Date of Birth:  09/22/1955     BSA:          2.179 m Patient Age:    66 years      BP:           114/80 mmHg Patient Gender: M             HR:           72 bpm. Exam Location:  [address]  Procedure: 2D Echo, Cardiac Doppler and Color Doppler  Indications:    I35.1 Aortic Regurgitation  History:        Patient has prior history of Echocardiogram examinations, most recent 07/30/2017. AVR-59mm Magna Ease (February 2019).; Risk Factors:Hypertension and Dyslipidemia. Obstructive sleep apnea. Aortic Valve: 25 mm Magna Ease valve is present in the aortic position. Procedure Date: 06/05/17.  Sonographer:    [name] Anderson RDCS Referring Phys: [id number] [name] Anderson  IMPRESSIONS   1. Left ventricular ejection fraction, by estimation, is 55 to 60%. The left ventricle has normal function. The left ventricle has no regional wall motion abnormalities. Left ventricular diastolic parameters are consistent with Grade I diastolic dysfunction (impaired relaxation). 2. Right ventricular systolic function is normal. The right ventricular  size is normal. 3. The mitral valve is normal in structure. No evidence of mitral valve regurgitation. No evidence of mitral stenosis. 4. The aortic valve has been repaired/replaced. Aortic valve regurgitation is not visualized. No aortic stenosis is present. There is a 25 mm Magna Ease valve present in the aortic position. Procedure Date: 06/05/17. 5. Aortic dilatation noted. There is mild dilatation of the aortic root, measuring 42 mm. 6. The inferior vena cava is normal in size with greater than 50% respiratory variability, suggesting right atrial pressure of 3 mmHg.  FINDINGS Left Ventricle: Left ventricular ejection fraction, by estimation, is 55 to 60%. The left ventricle has normal function. The left ventricle has no regional wall motion abnormalities. The left ventricular internal cavity size was normal in size. There is no left ventricular hypertrophy. Left ventricular diastolic parameters are consistent with Grade I diastolic dysfunction (impaired relaxation).  Right Ventricle: The right ventricular size is normal. Right ventricular systolic function is normal.  Left Atrium: Left atrial size was normal in  size.  Right Atrium: Right atrial size was normal in size.  Pericardium: There is no evidence of pericardial effusion.  Mitral Valve: The mitral valve is normal in structure. No evidence of mitral valve regurgitation. No evidence of mitral valve stenosis.  Tricuspid Valve: The tricuspid valve is normal in structure. Tricuspid valve regurgitation is trivial. No evidence of tricuspid stenosis.  Aortic Valve: The aortic valve has been repaired/replaced. Aortic valve regurgitation is not visualized. No aortic stenosis is present. Aortic valve mean gradient measures 13.0 mmHg. Aortic valve peak gradient measures 22.6 mmHg. There is a 25 mm Magna Ease valve present in the aortic position. Procedure Date: 06/05/17.  Pulmonic Valve: The pulmonic valve was normal in structure. Pulmonic valve  regurgitation is trivial. No evidence of pulmonic stenosis.  Aorta: Aortic dilatation noted. There is mild dilatation of the aortic root, measuring 42 mm.  Venous: The inferior vena cava is normal in size with greater than 50% respiratory variability, suggesting right atrial pressure of 3 mmHg.  IAS/Shunts: No atrial level shunt detected by color flow Doppler.   LEFT VENTRICLE PLAX 2D LVIDd:         4.80 cm Diastology LVIDs:         2.70 cm LV e' medial:    6.53 cm/s LV PW:         0.90 cm LV E/e' medial:  9.2 LV IVS:        0.90 cm LV e' lateral:   8.32 cm/s LV E/e' lateral: 7.2   RIGHT VENTRICLE             IVC RV Basal diam:  3.60 cm     IVC diam: 1.30 cm RV S prime:     10.65 cm/s TAPSE (M-mode): 1.9 cm  LEFT ATRIUM             Index        RIGHT ATRIUM           Index LA diam:        3.60 cm 1.65 cm/m   RA Area:     14.60 cm LA Vol (A2C):   42.2 ml 19.37 ml/m  RA Volume:   41.00 ml  18.82 ml/m LA Vol (A4C):   27.5 ml 12.62 ml/m LA Biplane Vol: 34.3 ml 15.74 ml/m AORTIC VALVE AV Vmax:           237.80 cm/s AV Vmean:          169.000 cm/s AV VTI:            0.491 m AV Peak Grad:      22.6 mmHg AV Mean Grad:      13.0 mmHg LVOT Vmax:         91.35 cm/s LVOT Vmean:        62.200 cm/s LVOT VTI:          0.198 m LVOT/AV VTI ratio: 0.40  AORTA Ao Root diam: 4.20 cm Ao Asc diam:  4.10 cm  MITRAL VALVE MV Area (PHT): 2.95 cm    SHUNTS MV Decel Time: 257 msec    Systemic VTI: 0.20 m MV E velocity: 59.90 cm/s MV A velocity: 68.35 cm/s MV E/A ratio:  0.88  [name] Millers MD Electronically signed by [name] Millers MD Signature Date/Time: 01/01/2023/10:42:11 AM    Final   TEE  ECHO TEE 06/05/2017  Interpretation Summary  Septum: Atrial septal motion is hypermobile.  Mitral valve: Dilated mitral annulus. Moderate leaflet thickening is present. Mild-to-moderate  stenosis. Trace regurgitation. There is non-mobile anterior leaflet vegetation.  Aortic valve:  The valve is trileaflet. Mild valve thickening present. No stenosis. Severe regurgitation. Holodiastolic flow reversal in the descending thoracic aorta. Large mobile vegetation present on the noncoronary cusp. 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  Aorta: The aortic root is mildly dilated at the sinus of Valsalva.  Right ventricle: Normal wall thickness and ejection fraction. Cavity is mildly dilated.  Tricuspid valve: Trace regurgitation.   MONITORS  LONG TERM MONITOR (3-14 DAYS) 02/05/2023  Narrative   Patient had a minimum heart rate of 45 bpm, maximum heart rate of 182 bpm, and average heart rate of 78 bpm.   Predominant underlying rhythm was sinus rhythm.   Frequent episodes of SVT, lasting 2.5 minutes at longest.   Isolated PACs were occasional (1.7%).   Isolated PVCs were rare (<1.0%).   Triggered and diary events associated with sinus rhythm and PACs.  Asymptomatic, frequent SVT.           Risk Assessment/Calculations:             Physical Exam:   VS:  BP 102/80   Pulse 66   Ht 5\' 11"  (1.803 m)   Wt 219 lb (99.3 kg)   SpO2 98%   BMI 30.54 kg/m    Wt Readings from Last 3 Encounters:  02/18/23 219 lb (99.3 kg)  01/14/23 215 lb (97.5 kg)  01/07/23 218 lb (98.9 kg)    GEN: Well nourished, well developed in no acute distress NECK: No JVD; No carotid bruits CARDIAC: RRR, no murmurs, rubs, gallops RESPIRATORY:  Clear to auscultation without rales, wheezing or rhonchi  ABDOMEN: Soft, non-tender, non-distended EXTREMITIES:  No edema; No deformity   ASSESSMENT AND PLAN: .    Supraventricular Tachycardia (SVT) Multiple episodes of SVT noted on heart monitor, longest episode lasting less than 3 minutes. Currently on Metoprolol 25mg . Discussed the risks/benefits of increasing Metoprolol dose (potential for hypotension / better rate control and suppression of SVT episodes). -Increase Metoprolol to 37.5mg  daily. -Monitor blood pressure for the first two weeks  after dose increase. -Contact office if systolic blood pressure drops below .  History of AVR: Stable on last echocardiogram  Thoracic aortic aneurysm: Recent echocardiogram obtained in September 2024 demonstrated dilated thoracic aorta measuring at 42 mm, will repeat in 1 year.       Dispo: Follow-up with Dr. Jens Anderson in 4-6 months  Signed, Azalee Course, [place]

## 2023-02-18 NOTE — Patient Instructions (Signed)
Medication Instructions:  No changes *If you need a refill on your cardiac medications before your next appointment, please call your pharmacy*   Lab Work: No labs If you have labs (blood work) drawn today and your tests are completely normal, you will receive your results only by: MyChart Message (if you have MyChart) OR A paper copy in the mail If you have any lab test that is abnormal or we need to change your treatment, we will call you to review the results.   Testing/Procedures: No testing   Follow-Up: At [facility], you and your health needs are our priority.  As part of our continuing mission to provide you with exceptional heart care, we have created designated Provider Care Teams.  These Care Teams include your primary Cardiologist (physician) and Advanced Practice Providers (APPs -  Physician Assistants and Nurse Practitioners) who all work together to provide you with the care you need, when you need it.  Your next appointment:   6 month(s)  Provider:   Olga Millers, MD    Other Instructions CONTINUE TO MONITOR BLOOD PRESSURE IF SYSTOLIC IS CONSISTENTLY <95 CALL OFFICE

## 2023-06-17 ENCOUNTER — Ambulatory Visit: Payer: Medicare Other | Attending: General Practice | Admitting: Emergency Medicine

## 2023-06-17 ENCOUNTER — Encounter: Payer: Self-pay | Admitting: General Practice

## 2023-06-17 VITALS — BP 106/68 | HR 67 | Ht 71.0 in | Wt 225.2 lb

## 2023-06-17 DIAGNOSIS — I351 Nonrheumatic aortic (valve) insufficiency: Secondary | ICD-10-CM | POA: Diagnosis present

## 2023-06-17 DIAGNOSIS — G4733 Obstructive sleep apnea (adult) (pediatric): Secondary | ICD-10-CM | POA: Insufficient documentation

## 2023-06-17 DIAGNOSIS — I10 Essential (primary) hypertension: Secondary | ICD-10-CM | POA: Diagnosis not present

## 2023-06-17 DIAGNOSIS — E782 Mixed hyperlipidemia: Secondary | ICD-10-CM | POA: Insufficient documentation

## 2023-06-17 DIAGNOSIS — I77810 Thoracic aortic ectasia: Secondary | ICD-10-CM | POA: Diagnosis present

## 2023-06-17 DIAGNOSIS — Z952 Presence of prosthetic heart valve: Secondary | ICD-10-CM | POA: Diagnosis present

## 2023-06-17 DIAGNOSIS — I471 Supraventricular tachycardia, unspecified: Secondary | ICD-10-CM | POA: Diagnosis present

## 2023-06-17 NOTE — Patient Instructions (Signed)
 Medication Instructions:  The current medical regimen is effective;  continue present plan and medications as directed. Please refer to the Current Medication list given to you today.  *If you need a refill on your cardiac medications before your next appointment, please call your pharmacy*  Lab Work: NONE  Testing/Procedures: NONE  Follow-Up: At [facility], you and your health needs are our priority.  As part of our continuing mission to provide you with exceptional heart care, we have created designated Provider Care Teams.  These Care Teams include your primary Cardiologist (physician) and Advanced Practice Providers (APPs -  Physician Assistants and Nurse Practitioners) who all work together to provide you with the care you need, when you need it.  Your next appointment:   6 month(s)  Provider:   Olga Millers, MD

## 2023-06-17 NOTE — Progress Notes (Signed)
Cardiology Office Note:    Date:  06/17/2023  ID:  [name], DOB 08/02/1955, MRN [id number] PCP: Tally Joe, MD  [facility] Providers Cardiologist:  Olga Millers, MD       Patient Profile:      [name] Anderson is a 68 y.o. male with visit-pertinent history of aortic regurgitation s/p aortic valve replacement, aortic root dilation, hypertension, hyperlipidemia  Echocardiogram and 2018 showed EF 55%, severe left ventricular enlargement, grade 1 DD, severe aortic insufficiency, dilated aortic root at 46 mm, mild MR, mild left atrial enlargement.  CTA of chest/aorta in 2018 showed aortic root at 4.7 cm.  TEE November 2018 showed normal LV function, aortic valve vegetation on the left coronary cusp, severe aortic insufficiency, mildly dilated aortic root, redundant mitral valve chordae and associated vegetation cannot be excluded, mild MR.  Blood cultures grew coagulation negative staph.  He was placed on antibiotics and ultimately underwent aortic valve replacement with bioprosthetic valve in February 2019.  Cardiac catheterization prior to surgery showed normal coronary arteries and normal right and left heart pressures.  Preoperative cardiac Doppler showed no evidence of stenosis.  He did have postop atrial fibrillation treated with amiodarone.  Echocardiogram 2019 showed EF 50-55%, grade 1 DD, normally functioning bioprosthetic aortic valve.  CT chest aorta in 05/2021 showed stable changes of aortic valve replacement, aortic root 4.3 cm.  He was evaluated by CT surgery who recommended follow-up with Dr. Jens Som for ongoing monitoring of aortic root with routine echocardiogram.    Was seen in clinic on 12/18/2022 for preoperative clearance for shock lithotripsy.  He is cleared from a cardiac perspective.  He presented for surgery on 12/24/2022 and had episodes of intermittent sinus tachycardia with heart rate 105.  Echocardiogram 12/2022 showed EF 55-60%, grade 1 DD, bioprosthetic aortic  valve without significant stenosis or regurgitation, dilated aortic root measuring 42 mm  Seen in clinic on 01/07/2023 for evaluation of tachycardia prior to his surgery.  His losartan was decreased to 25 mg daily while increasing Toprol tartrate 25 mg twice daily.  Heart monitor was ordered showing predominant underlying rhythm was sinus.  He had frequent episodes of SVT lasting 2.5 minutes at longest.  Isolated PACs were occasional (1.7%) and isolated PVCs were rare (less than 1%).  Last seen in clinic on 02/18/2023.  His metoprolol was increased to 37.5 mg twice daily and losartan discontinued.      History of Present Illness:  Discussed the use of AI scribe software for clinical note transcription with the patient, who gave verbal consent to proceed.  [name] Anderson is a 68 y.o. male who returns for 43-month follow-up for SVT, and aortic valve replacement.  The patient arrives in the clinic by himself today.  He is without any cardiovascular concerns or complaints today or over the last 6 months.  He notes that he is doing well since increasing his metoprolol to 37.5 mg twice daily.  He notes that he is unaware of his SVT and has not experienced any fast heart rates, chest pain/exertional angina, shortness of breath, syncope.  He has also without any lower extremity edema, orthopnea, PND.  He has no exertional chest pain.  The patient also reports compliance with CPAP use for sleep apnea.   He is a retired Environmental consultant.  The patient is active, walking a couple of miles five to six days a week.  He has 3 grandchildren that he watches daily which keeps him very busy.  Review of Systems  Constitutional: Negative for weight gain and weight loss.  Cardiovascular:  Negative for chest pain, claudication, dyspnea on exertion, irregular heartbeat, leg swelling, near-syncope, orthopnea, palpitations, paroxysmal nocturnal dyspnea and syncope.  Respiratory:  Negative for cough, hemoptysis and shortness of  breath.   Gastrointestinal:  Negative for abdominal pain, hematochezia and melena.  Genitourinary:  Negative for hematuria.  Neurological:  Negative for dizziness and light-headedness.     See HPI     Home Medications:    Prior to Admission medications   Medication Sig Start Date End Date Taking? Authorizing Provider  acetaminophen (TYLENOL) 500 MG tablet Take 1 tablet (500 mg total) by mouth every 6 (six) hours as needed (for pain.). 06/09/17   Ardelle Balls, PA-C  aluminum chloride (DRYSOL) 20 % external solution Apply 1 application  topically 3 (three) times daily as needed (for sweating.).    [provider]  amoxicillin (AMOXIL) 500 MG tablet For dental procedures 12/18/19   [provider]  Ascorbic Acid (VITAMIN C) 1000 MG tablet Take 1,000 mg by mouth daily.    [provider]  aspirin EC 81 MG tablet Take 81 mg by mouth daily.    [provider]  atorvastatin (LIPITOR) 10 MG tablet Take 1 tablet (10 mg total) by mouth daily at 6 PM. 04/03/17   Duke, Roe Rutherford, PA  cetirizine (ZYRTEC) 10 MG tablet Take 10 mg by mouth daily as needed for allergies.    [provider]  clobetasol (OLUX) 0.05 % topical foam Apply to affected area after bathing 02/27/21   Janalyn Harder, MD  ECHINACEA PO Take 760 mg by mouth daily.    [provider]  fluticasone (FLONASE) 50 MCG/ACT nasal spray Place 1 spray into both nostrils at bedtime as needed for allergies or rhinitis.    [provider]  loratadine (CLARITIN) 10 MG tablet Take 10 mg by mouth daily as needed for allergies.    [provider]  metoprolol tartrate (LOPRESSOR) 25 MG tablet Take 1.5 tablets (37.5 mg total) by mouth 2 (two) times daily. 02/14/23 05/15/23  Azalee Course, PA  Naphazoline-Pheniramine 0.027-0.315 % SOLN Place 1-2 drops into both eyes 3 (three) times daily as needed (for allergy eyes.).    [provider]  tadalafil (CIALIS) 20 MG tablet  Take 20 mg by mouth daily as needed for erectile dysfunction.    [provider]  tamsulosin (FLOMAX) 0.4 MG CAPS capsule Take 1 capsule (0.4 mg total) by mouth daily. 07/04/20   Crista Elliot, MD   Studies Reviewed:       Luci Bank 02/05/2023   Patient had a minimum heart rate of 45 bpm, maximum heart rate of 182 bpm, and average heart rate of 78 bpm.   Predominant underlying rhythm was sinus rhythm.   Frequent episodes of SVT, lasting 2.5 minutes at longest.   Isolated PACs were occasional (1.7%).   Isolated PVCs were rare (<1.0%).   Triggered and diary events associated with sinus rhythm and PACs.  Echocardiogram 01/01/2023 1. Left ventricular ejection fraction, by estimation, is 55 to 60%. The  left ventricle has normal function. The left ventricle has no regional  wall motion abnormalities. Left ventricular diastolic parameters are  consistent with Grade I diastolic  dysfunction (impaired relaxation).   2. Right ventricular systolic function is normal. The right ventricular  size is normal.   3. The mitral valve is normal in structure. No evidence of mitral valve  regurgitation.  No evidence of mitral stenosis.   4. The aortic valve has been repaired/replaced. Aortic valve  regurgitation is not visualized. No aortic stenosis is present. There is a  25 mm Magna Ease valve present in the aortic position. Procedure Date:  06/05/17.   5. Aortic dilatation noted. There is mild dilatation of the aortic root,  measuring 42 mm.   6. The inferior vena cava is normal in size with greater than 50%  respiratory variability, suggesting right atrial pressure of 3 mmHg.  Risk Assessment/Calculations:             Physical Exam:   VS:  BP 106/68 (BP Location: Right Arm, Patient Position: Sitting, Cuff Size: Normal)   Pulse 67   Ht 5\' 11"  (1.803 m)   Wt 225 lb 3.2 oz (102.2 kg)   SpO2 95%   BMI 31.41 kg/m    Wt Readings from Last 3 Encounters:  06/17/23 225 lb 3.2 oz (102.2 kg)   02/18/23 219 lb (99.3 kg)  01/14/23 215 lb (97.5 kg)    Constitutional:      Appearance: Normal and healthy appearance. Not in distress.  Neck:     Vascular: JVD normal.  Pulmonary:     Effort: Pulmonary effort is normal.     Breath sounds: Normal breath sounds.  Chest:     Chest wall: Not tender to palpatation.  Cardiovascular:     PMI at left midclavicular line. Normal rate. Regular rhythm. Normal S1. Normal S2.      Murmurs: There is no murmur.     No gallop.  No click. No rub.  Pulses:    Intact distal pulses.  Edema:    Peripheral edema absent.  Musculoskeletal: Normal range of motion.     Cervical back: Normal range of motion and neck supple. Skin:    General: Skin is warm and dry.  Neurological:     General: No focal deficit present.     Mental Status: Alert, oriented to person, place, and time and oriented to person, place and time.  Psychiatric:        Mood and Affect: Mood and affect normal.        Behavior: Behavior is cooperative.        Thought Content: Thought content normal.        Assessment and Plan:  SVT Heart monitor 01/2023 showed frequent episodes of SVT, longest lasting 2.5 minutes Echocardiogram 12/2022 with LVEF 55 to 60%, no RWMA, grade 1 DD He has no cardiac awareness of tachycardia episodes.  He is asymptomatic without elevated heart rates, palpitations, syncope, dyspnea, chest pain.  He is doing well on increased dose of metoprolol with no complaints -Continue metoprolol tartrate 37.5 mg twice daily -Recommend staying adequately hydrated  Aortic valve regurgitation s/p AVR S/p aortic valve replacement 05/2017 Most recent echocardiogram 12/2022 shows normally functioning AVR He is asymptomatic without chest pain, dyspnea, syncope -Continue aspirin 81 mg daily -SBE prophylaxis with 2G, 1 hour prior to dental appointment  Aortic root dilation CT chest aorta in 05/2021 showed stable changes of aortic valve replacement, aortic root 4.3 cm.  Was  evaluated by CT surgery who recommended routine monitoring with echocardiogram.  Most recent echocardiogram 12/2022 showed mild dilation of aortic root measuring 42 mm. -Routine echocardiogram should be performed 12/2023 for ongoing monitoring  Hypertension Blood pressure today 106/68 and under excellent control -Continue metoprolol tartrate 37.5 mg twice daily -Encouraged to monitor blood pressure at home  Hyperlipidemia LDL 92 on  06/03/2023.  And under excellent control with goal of less than 100 LHC 03/2017 showed normal coronaries -Continue atorvastatin 10 mg daily  OSA Reports adherence to CPAP            Dispo:  Return in about 6 months (around 12/15/2023).  Signed, Denyce Robert, NP

## 2023-10-16 ENCOUNTER — Other Ambulatory Visit: Payer: Self-pay | Admitting: Urology

## 2023-10-16 DIAGNOSIS — N50812 Left testicular pain: Secondary | ICD-10-CM

## 2023-10-17 ENCOUNTER — Ambulatory Visit
Admission: RE | Admit: 2023-10-17 | Discharge: 2023-10-17 | Disposition: A | Discharge status: Home / Self Care | Source: Ambulatory Visit | Attending: Urology | Admitting: Urology

## 2023-10-17 DIAGNOSIS — N50812 Left testicular pain: Secondary | ICD-10-CM

## 2023-11-12 ENCOUNTER — Other Ambulatory Visit: Payer: Self-pay | Admitting: Physician Assistant

## 2023-12-31 ENCOUNTER — Ambulatory Visit: Admitting: Cardiology

## 2024-02-29 NOTE — Progress Notes (Signed)
 HPI: FU AVR. Echocardiogram 11/18 showed Ejection fraction 50-55%, severe left ventricular enlargement, grade 1 diastolic dysfunction, severe aortic insufficiency, dilated aortic root at 46 mm, mild mitral regurgitation and mild left atrial enlargement. CTA of thoracic aorta repeated November 2018; the aortic root measured 4.7 cm. Transesophageal echocardiogram November 2018 showed normal LV function, aortic valve vegetation on left coronary cusp, severe aortic insufficiency, mildly dilated aortic root, redundant mitral valve chordae and associated vegetation could not be excluded, mild mitral regurgitation. Blood cultures grew coag negative staph. Patient placed on antibiotics with plans for aortic valve/aortic root replacement after completing 6 weeks. Cardiac catheterization 12/20 showed normal coronary arteries and normal right and left heart pressures. Preoperative carotid Dopplers January 2019 showed no stenosis. Patient underwent aortic valve replacement (bioprosthetic AVR) on June 05, 2017.  The aortic root was not replaced as it measured 4.1 cm at time of surgery. Patient did have postoperative atrial fibrillation treated with amiodarone . Chest CTA 2/23 showed stable changes of aortic valve replacement; aortic root 4.3 cm.  Echocardiogram 9/24 showed normal LV function, grade 1 diastolic dysfunction, bioprosthetic aortic valve with no aortic insufficiency, ascending aorta 42 mm. Monitor 10/24 showed sinus with PACs, frequent runs of SVT and occasional PVCs. Since last seen, the patient has dyspnea with more extreme activities but not with routine activities. It is relieved with rest. It is not associated with chest pain. There is no orthopnea, PND or pedal edema. There is no syncope or palpitations. There is no exertional chest pain.   Current Outpatient Medications  Medication Sig Dispense Refill   acetaminophen  (TYLENOL ) 500 MG tablet Take 1 tablet (500 mg total) by mouth every 6 (six)  hours as needed (for pain.). 30 tablet 0   aluminum chloride (DRYSOL) 20 % external solution Apply 1 application  topically 3 (three) times daily as needed (for sweating.).     amoxicillin  (AMOXIL ) 500 MG tablet For dental procedures (Patient taking differently: Take 500 mg by mouth as needed. For dental procedures)     Ascorbic Acid  (VITAMIN C ) 1000 MG tablet Take 1,000 mg by mouth daily.     aspirin  EC 81 MG tablet Take 81 mg by mouth daily.     atorvastatin  (LIPITOR) 10 MG tablet Take 1 tablet (10 mg total) by mouth daily at 6 PM. 30 tablet 6   cetirizine (ZYRTEC) 10 MG tablet Take 10 mg by mouth daily as needed for allergies.     ECHINACEA PO Take 760 mg by mouth daily.     fluticasone (FLONASE) 50 MCG/ACT nasal spray Place 1 spray into both nostrils at bedtime as needed for allergies or rhinitis.     loratadine  (CLARITIN ) 10 MG tablet Take 10 mg by mouth daily as needed for allergies.     metoprolol  tartrate (LOPRESSOR ) 25 MG tablet TAKE ONE AND ONE-HALF TABLETS TWICE A DAY 180 tablet 5   Naphazoline-Pheniramine 0.027-0.315 % SOLN Place 1-2 drops into both eyes 3 (three) times daily as needed (for allergy eyes.).     tadalafil (CIALIS) 20 MG tablet Take 20 mg by mouth daily as needed for erectile dysfunction.     tamsulosin  (FLOMAX ) 0.4 MG CAPS capsule Take 1 capsule (0.4 mg total) by mouth daily. (Patient taking differently: Take 0.4 mg by mouth as needed.) 14 capsule 1   clobetasol  (OLUX ) 0.05 % topical foam Apply to affected area after bathing 50 g 11   No current facility-administered medications for this visit.     Past Medical  History:  Diagnosis Date   Allergy    Anemia    Barrett's esophagus - short segment 03/13/2017   Basal cell carcinoma 02/17/1998   Left Post Auriular (tx p bx)   BCC (basal cell carcinoma of skin) 01/13/2002   Infra Tip Nose (MOH's)   BCC (basal cell carcinoma of skin) 03/28/2006   Right Neck (curet and excision)   BCC (basal cell carcinoma of skin)  03/28/2006   Right Ear Rim (curet, excision, 5FU)   BCC (basal cell carcinoma of skin) 04/28/2012   Right Ear Rim (MOH's)   BCC (basal cell carcinoma of skin) 09/09/2012   Right Eyebrow (curet and 5FU)   BCC (basal cell carcinoma of skin) 09/09/2012   Right Back Sup (tx p bx)   BCC (basal cell carcinoma of skin) 09/09/2012   Right Back Inf (tx p bx)   Cancer (HCC) 09/11/2013   testicular   Complication of anesthesia    hard to wake  - 20 years ago   Diverticulitis    ED (erectile dysfunction)    Endocarditis    /notes 03/29/2017   H/O aneurysm 03/05/2017   High cholesterol    History of basal cell carcinoma excision    02/2002   --  NOSE  &   2013  RIGHT EAR   History of kidney stones    Hyperlipidemia    Hypertension    Mild obstructive sleep apnea    PER STUDY 01-18-2005--  CPAP   Moderate aortic valve insufficiency    Multiple pulmonary nodules    PER CT--  PROBABLE  GRANULOMATOUS - patient is not aware of this   Personal history of colonic polyps    TUBULAR ADENOMA--    Seasonal allergies    Superficial basal cell carcinoma (BCC) 03/24/2014   Left Lower Back   Superficial basal cell carcinoma (BCC) 01/27/2018   Above Left Eyebrow (curet and 5FU)   Superficial basal cell carcinoma (BCC) 01/27/2018   Right Upper Abdomen (curet and 5FU)   Superficial nodular basal cell carcinoma (BCC) 01/27/2018   Left Scapula (curet and 5FU)   Superficial nodular basal cell carcinoma (BCC) 01/27/2018   Right Upper Thigh (curet and 5FU)   Superficial nodular infiltrative basal cell carcinoma (BCC) 01/27/2018   Right Lower Back Mid (curet and 5FU)   Testicular cancer [facility])    Testicular mass    RIGHT    Past Surgical History:  Procedure Laterality Date   AORTIC VALVE REPLACEMENT N/A 06/05/2017   Procedure: AORTIC VALVE REPLACEMENT (AVR) using 25mm Magna Ease valve;  Surgeon: Army Dallas NOVAK, MD;  Location: [facility] OR;  Service: Open Heart Surgery;  Laterality: N/A;   COLONOSCOPY   01/24/06, 07/10/11   COLONOSCOPY WITH ESOPHAGOGASTRODUODENOSCOPY (EGD)     EXTRACORPOREAL SHOCK WAVE LITHOTRIPSY     EXTRACORPOREAL SHOCK WAVE LITHOTRIPSY Left 07/04/2020   Procedure: EXTRACORPOREAL SHOCK WAVE LITHOTRIPSY (ESWL);  Surgeon: Carolee Sherwood JONETTA DOUGLAS, MD;  [facility];  Service: Urology;  Laterality: Left;   EXTRACORPOREAL SHOCK WAVE LITHOTRIPSY Left 12/24/2022   Procedure: LEFT EXTRACORPOREAL SHOCK WAVE LITHOTRIPSY (ESWL);  Surgeon: Renda Glance, MD;  Location: [facility];  Service: Urology;  Laterality: Left;  75 MINUTES NEEDED FOR CASE   EXTRACORPOREAL SHOCK WAVE LITHOTRIPSY Left 01/14/2023   Procedure: LEFT EXTRACORPOREAL SHOCK WAVE LITHOTRIPSY (ESWL);  Surgeon: Devere Lonni Righter, MD;  Location: [facility];  Service: Urology;  Laterality: Left;  75 MINUTES NEEDED FOR CASE   HERNIA  REPAIR     MELANOMA EXCISION Left 11/07/2022   Hip   MOHS SURGERY  02/2002  &  2013   TIP OF NOSE-///     RIGHT EAR   ORCHIECTOMY Right 09/11/2013   Procedure:  RIGHT ORCHIECTOMY;  Surgeon: Garnette CHRISTELLA Shack, MD;  Location: [facility];  Service: Urology;  Laterality: Right;   RIGHT/LEFT HEART CATH AND CORONARY ANGIOGRAPHY N/A 04/18/2017   Procedure: RIGHT/LEFT HEART CATH AND CORONARY ANGIOGRAPHY;  Surgeon: Wonda Sharper, MD;  Location: [facility] INVASIVE CV LAB;  Service: Cardiovascular;  Laterality: N/A;   SCROTAL EXPLORATION Right 09/11/2013   Procedure: RIGHT INGUINAL EXPLORATION;  Surgeon: Garnette CHRISTELLA Shack, MD;  Location: [facility];  Service: Urology;  Laterality: Right;   TEE WITHOUT CARDIOVERSION N/A 03/29/2017   Procedure: TRANSESOPHAGEAL ECHOCARDIOGRAM (TEE);  Surgeon: Pietro Redell RAMAN, MD;  Location: [facility] ENDOSCOPY;  Service: Cardiovascular;  Laterality: N/A;   TEE WITHOUT CARDIOVERSION N/A 06/05/2017   Procedure: TRANSESOPHAGEAL ECHOCARDIOGRAM (TEE);  Surgeon: Army Dallas NOVAK, MD;  Location: [facility] OR;   Service: Open Heart Surgery;  Laterality: N/A;   TRANSTHORACIC ECHOCARDIOGRAM  02-25-2013   DR Phelan Goers   NORMAL LVSF/  EF 55-60%/   GRADE 2 DIASTOLIC DYSFUNCTION/  ECCENTRIC MODERATE AORTIC INSUFFICIENCY WITHOUT STENOSIS/  MILD DILATED AORTIC ROOT/ TRIVIAL MR, TR,  & PR   UMBILICAL HERNIA REPAIR  06/08/2009   UMBILICAL HERNIA REPAIR  05/22/2018   WITH MESH   UMBILICAL HERNIA REPAIR N/A 05/22/2018   Procedure: LAPAROSCOPIC REPAIR OF RECURRENT UMBILICAL HERNIA WITH MESH;  Surgeon: Belinda Cough, MD;  Location: [facility] OR;  Service: General;  Laterality: N/A;   URETEROSCOPIC LASER LITHOTRIPSY STONE EXTRACTION  2007    Social History   Socioeconomic History   Marital status: Married    Spouse name: Jerel   Number of children: 2   Years of education: 12   Highest education level: Not on file  Occupational History   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  Tobacco Use   Smoking status: Some Days    Types: Pipe   Smokeless tobacco: Former    Types: Chew    Quit date: 04/26/2011   Tobacco comments:    OCCASIONAL  PIPE SMOKER  (NEVER SMOKED CIGARETTES)  Vaping Use   Vaping status: Never Used  Substance and Sexual Activity   Alcohol use: Not Currently    Comment: RARE   Drug use: No   Sexual activity: Yes  Other Topics Concern   Not on file  Social History Narrative   Lives with wife Jerel   Retired Medical laboratory scientific officer   Caffeine use: coffee- 1 cup per day   Occasional pipe smoker   2 sons no daughters one of his sons is a company secretary   2 caffeinated drinks daily   Social Drivers of Corporate Investment Banker Strain: Not on file  Food Insecurity: Not on file  Transportation Needs: Not on file  Physical Activity: Not on file  Stress: Not on file  Social Connections: Not on file  Intimate Partner Violence: Not on file    Family History  Problem Relation Age of Onset   Colon cancer Father    Heart disease Paternal Grandmother    Stomach cancer Neg Hx    Esophageal cancer Neg Hx     Pancreatic cancer Neg Hx    Neuropathy Neg Hx     ROS: no fevers or chills, productive cough, hemoptysis, dysphasia, odynophagia, melena, hematochezia, dysuria, hematuria, rash, seizure activity, orthopnea, PND,  pedal edema, claudication. Remaining systems are negative.  Physical Exam: Well-developed well-nourished in no acute distress.  Skin is warm and dry.  HEENT is normal.  Neck is supple.  Chest is clear to auscultation with normal expansion.  Cardiovascular exam is regular rate and rhythm.  Abdominal exam nontender or distended. No masses palpated. Extremities show no edema. neuro grossly intact  EKG Interpretation Date/Time:  Tuesday March 10 2024 08:52:41 EST Ventricular Rate:  68 PR Interval:  184 QRS Duration:  92 QT Interval:  422 QTC Calculation: 448 R Axis:   -21  Text Interpretation: Normal sinus rhythm Confirmed by Pietro Rogue ([id number]) on 03/10/2024 8:53:25 AM    A/P  1 status post aortic valve replacement-plan to continue SBE prophylaxis. Most recent echo showed normally functioning aortic valve.    2 dilated aortic root-Unchanged on most recent CTA and echo; will repeat CTA.    3 hypertension-blood pressure controlled.  Continue present medical regimen.  Check potassium and renal function.   4 hyperlipidemia-continue statin.  Check lipids and liver.  5 SVT-continue beta blocker.  6 OSA-continue CPAP.   Rogue Pietro, MD

## 2024-03-10 ENCOUNTER — Ambulatory Visit: Attending: Cardiology | Admitting: Cardiology

## 2024-03-10 ENCOUNTER — Encounter: Payer: Self-pay | Admitting: Cardiology

## 2024-03-10 VITALS — BP 110/78 | HR 68 | Ht 71.0 in | Wt 230.0 lb

## 2024-03-10 DIAGNOSIS — I712 Thoracic aortic aneurysm, without rupture, unspecified: Secondary | ICD-10-CM | POA: Insufficient documentation

## 2024-03-10 DIAGNOSIS — E782 Mixed hyperlipidemia: Secondary | ICD-10-CM | POA: Diagnosis not present

## 2024-03-10 DIAGNOSIS — I359 Nonrheumatic aortic valve disorder, unspecified: Secondary | ICD-10-CM | POA: Diagnosis not present

## 2024-03-10 DIAGNOSIS — I77810 Thoracic aortic ectasia: Secondary | ICD-10-CM | POA: Insufficient documentation

## 2024-03-10 DIAGNOSIS — Z952 Presence of prosthetic heart valve: Secondary | ICD-10-CM | POA: Insufficient documentation

## 2024-03-10 DIAGNOSIS — I10 Essential (primary) hypertension: Secondary | ICD-10-CM | POA: Diagnosis present

## 2024-03-10 NOTE — Patient Instructions (Signed)
  Lab Work: [employer] LIPID PANEL  HEPATIC PANEL   If you have labs (blood work) drawn today and your tests are completely normal, you will receive your results only by: MyChart Message (if you have MyChart) OR A paper copy in the mail If you have any lab test that is abnormal or we need to change your treatment, we will call you to review the results.  Testing/Procedures: CT AORTA  Non-Cardiac CT scanning, (CAT scanning), is a noninvasive, special x-ray that produces cross-sectional images of the body using x-rays and a computer. CT scans help physicians diagnose and treat medical conditions. For some CT exams, a contrast material is used to enhance visibility in the area of the body being studied. CT scans provide greater clarity and reveal more details than regular x-ray exams.   Follow-Up: At [facility], you and your health needs are our priority.  As part of our continuing mission to provide you with exceptional heart care, our providers are all part of one team.  This team includes your primary Cardiologist (physician) and Advanced Practice Providers or APPs (Physician Assistants and Nurse Practitioners) who all work together to provide you with the care you need, when you need it.  Your next appointment:   1 year(s)  Provider:   Redell Shallow, MD    We recommend signing up for the patient portal called MyChart.  Sign up information is provided on this After Visit Summary.  MyChart is used to connect with patients for Virtual Visits (Telemedicine).  Patients are able to view lab/test results, encounter notes, upcoming appointments, etc.  Non-urgent messages can be sent to your provider as well.   To learn more about what you can do with MyChart, go to [email or web address].

## 2024-03-11 ENCOUNTER — Ambulatory Visit: Payer: Self-pay | Admitting: Cardiology

## 2024-03-11 DIAGNOSIS — E782 Mixed hyperlipidemia: Secondary | ICD-10-CM

## 2024-03-11 LAB — BASIC METABOLIC PANEL WITH GFR
BUN/Creatinine Ratio: 11 (ref 10–24)
BUN: 12 mg/dL (ref 8–27)
CO2: 20 mmol/L (ref 20–29)
Calcium: 9.4 mg/dL (ref 8.6–10.2)
Chloride: 103 mmol/L (ref 96–106)
Creatinine, Ser: 1.12 mg/dL (ref 0.76–1.27)
Glucose: 99 mg/dL (ref 70–99)
Potassium: 4.4 mmol/L (ref 3.5–5.2)
Sodium: 138 mmol/L (ref 134–144)
eGFR: 72 mL/min/1.73 (ref >59)

## 2024-03-11 LAB — LIPID PANEL
Chol/HDL Ratio: 4.8 ratio (ref 0.0–5.0)
Cholesterol, Total: 173 mg/dL (ref 100–199)
HDL: 36 mg/dL — ABNORMAL LOW (ref >39)
LDL Chol Calc (NIH): 106 mg/dL — ABNORMAL HIGH (ref 0–99)
Triglycerides: 177 mg/dL — ABNORMAL HIGH (ref 0–149)
VLDL Cholesterol Cal: 31 mg/dL (ref 5–40)

## 2024-03-11 LAB — HEPATIC FUNCTION PANEL
ALT: 34 IU/L (ref 0–44)
AST: 24 IU/L (ref 0–40)
Albumin: 4.3 g/dL (ref 3.9–4.9)
Alkaline Phosphatase: 80 IU/L (ref 47–123)
Bilirubin Total: 0.6 mg/dL (ref 0.0–1.2)
Bilirubin, Direct: 0.21 mg/dL (ref 0.00–0.40)
Total Protein: 7.1 g/dL (ref 6.0–8.5)

## 2024-03-12 MED ORDER — ATORVASTATIN CALCIUM 20 MG PO TABS: 20.0000 mg | ORAL_TABLET | Freq: Every day | ORAL | 3 refills | Status: AC

## 2024-03-12 MED ORDER — ATORVASTATIN CALCIUM 20 MG PO TABS: 20.0000 mg | ORAL_TABLET | Freq: Every day | ORAL | 3 refills | Status: DC

## 2024-04-07 ENCOUNTER — Ambulatory Visit (HOSPITAL_COMMUNITY): Admission: RE | Admit: 2024-04-07 | Discharge: 2024-04-07 | Attending: Cardiology | Admitting: Cardiology

## 2024-04-07 DIAGNOSIS — I712 Thoracic aortic aneurysm, without rupture, unspecified: Secondary | ICD-10-CM

## 2024-04-07 MED ORDER — IOHEXOL 350 MG/ML SOLN
75.0000 mL | Freq: Once | INTRAVENOUS | Status: AC | PRN
  Administered 2024-04-07: 75 mL via INTRAVENOUS

## 2024-04-15 NOTE — Addendum Note (Signed)
 Addended by: RICHIE ADRIEN ORN on: 04/15/2024 01:57 PM   Modules accepted: Orders

## 2024-05-07 LAB — LIPID PANEL
Chol/HDL Ratio: 3.5 ratio (ref 0.0–5.0)
Cholesterol, Total: 137 mg/dL (ref 100–199)
HDL: 39 mg/dL — ABNORMAL LOW
LDL Chol Calc (NIH): 75 mg/dL (ref 0–99)
Triglycerides: 131 mg/dL (ref 0–149)
VLDL Cholesterol Cal: 23 mg/dL (ref 5–40)

## 2024-05-07 LAB — HEPATIC FUNCTION PANEL
ALT: 30 IU/L (ref 0–44)
AST: 22 IU/L (ref 0–40)
Albumin: 4.4 g/dL (ref 3.9–4.9)
Alkaline Phosphatase: 78 IU/L (ref 47–123)
Bilirubin Total: 0.5 mg/dL (ref 0.0–1.2)
Bilirubin, Direct: 0.16 mg/dL (ref 0.00–0.40)
Total Protein: 7.3 g/dL (ref 6.0–8.5)
